# Patient Record
Sex: Male | Born: 1950
Health system: Southern US, Community
[De-identification: ages and names within clinical notes are randomized; demographics above are authoritative.]

## PROBLEM LIST (undated history)

## (undated) DIAGNOSIS — I639 Cerebral infarction, unspecified: Secondary | ICD-10-CM

## (undated) DIAGNOSIS — E7800 Pure hypercholesterolemia, unspecified: Secondary | ICD-10-CM

## (undated) DIAGNOSIS — J309 Allergic rhinitis, unspecified: Secondary | ICD-10-CM

## (undated) DIAGNOSIS — R718 Other abnormality of red blood cells: Secondary | ICD-10-CM

## (undated) DIAGNOSIS — M479 Spondylosis, unspecified: Secondary | ICD-10-CM

## (undated) DIAGNOSIS — N529 Male erectile dysfunction, unspecified: Secondary | ICD-10-CM

## (undated) DIAGNOSIS — E119 Type 2 diabetes mellitus without complications: Secondary | ICD-10-CM

## (undated) DIAGNOSIS — I10 Essential (primary) hypertension: Secondary | ICD-10-CM

## (undated) DIAGNOSIS — E041 Nontoxic single thyroid nodule: Secondary | ICD-10-CM

## (undated) HISTORY — DX: Other abnormality of red blood cells: R71.8

## (undated) HISTORY — DX: Pure hypercholesterolemia, unspecified: E78.00

## (undated) HISTORY — DX: Nontoxic single thyroid nodule: E04.1

## (undated) HISTORY — DX: Spondylosis, unspecified: M47.9

## (undated) HISTORY — DX: Allergic rhinitis, unspecified: J30.9

## (undated) HISTORY — DX: Type 2 diabetes mellitus without complications: E11.9

## (undated) HISTORY — DX: Male erectile dysfunction, unspecified: N52.9

## (undated) HISTORY — DX: Essential (primary) hypertension: I10

---

## 1998-02-12 ENCOUNTER — Ambulatory Visit (HOSPITAL_COMMUNITY): Admission: RE | Admit: 1998-02-12 | Discharge: 1998-02-12 | Payer: Self-pay | Admitting: Internal Medicine

## 2000-05-11 ENCOUNTER — Ambulatory Visit (HOSPITAL_COMMUNITY): Admission: RE | Admit: 2000-05-11 | Discharge: 2000-05-11 | Payer: Self-pay | Admitting: Endocrinology

## 2000-05-11 ENCOUNTER — Encounter: Payer: Self-pay | Admitting: Endocrinology

## 2003-07-27 ENCOUNTER — Emergency Department (HOSPITAL_COMMUNITY): Admission: AD | Admit: 2003-07-27 | Discharge: 2003-07-27 | Payer: Self-pay | Admitting: Family Medicine

## 2004-07-07 ENCOUNTER — Ambulatory Visit: Payer: Self-pay | Admitting: Endocrinology

## 2004-07-08 ENCOUNTER — Ambulatory Visit (HOSPITAL_COMMUNITY): Admission: RE | Admit: 2004-07-08 | Discharge: 2004-07-08 | Payer: Self-pay | Admitting: Endocrinology

## 2004-07-08 IMAGING — US US SOFT TISSUE HEAD/NECK
1 series · 14 of 25 positions shown · non-contrast
Comparison: none

CLINICAL DATA: Enlarged thyroid. Hypertension. Diabetes mellitus.
 ULTRASOUND SOFT TISSUE NECK:
 Multiple scans of the thyroid show the right lobe to measure 4.7 x 2.2 x 2.7 cm with a single 2 x 2 x 3 mm cyst in the region of the mid portion of the right lobe of the thyroid. The isthmus of the thyroid is normal and measures 4 mm in thickness.  The left lobe of the thyroid measures 4.8 x 1.9 x 2.2 cm and shows no cystic or solid mass.

[Series 1: unknown · 0.12mm/px · 14 of 46 slices shown]
[im 1/46]
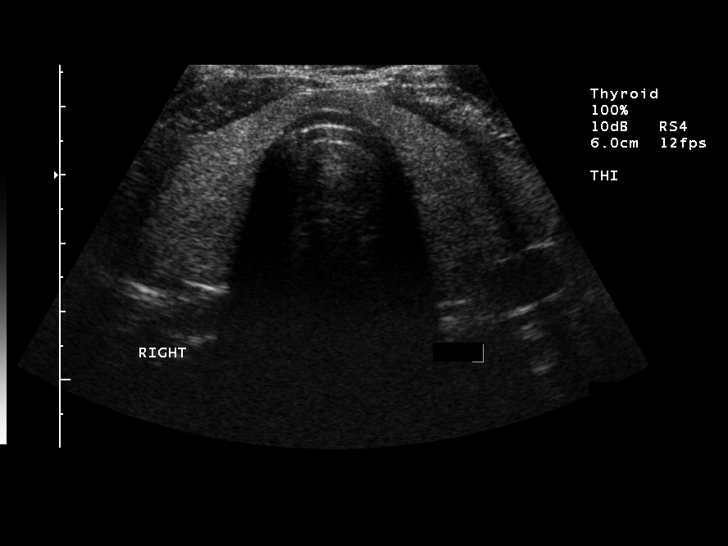
[im 4/46]
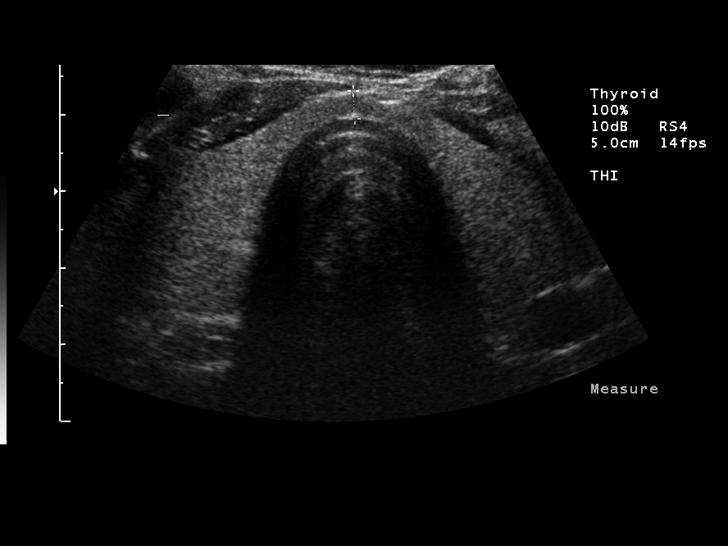
[im 8/46]
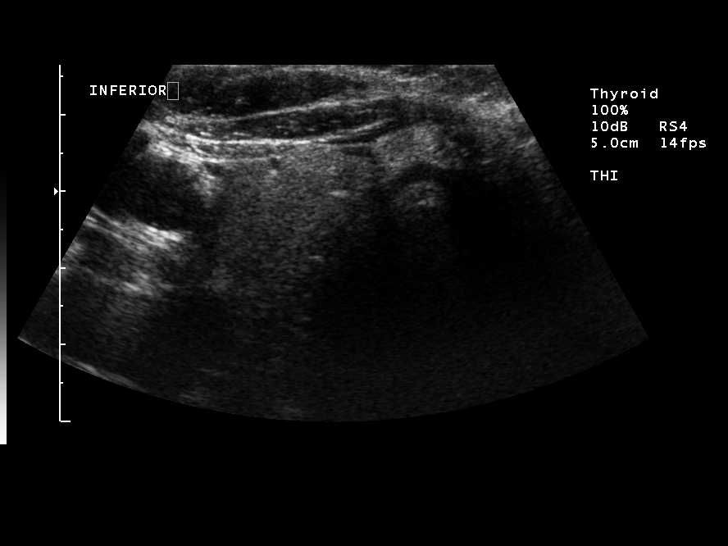
[im 12/46]
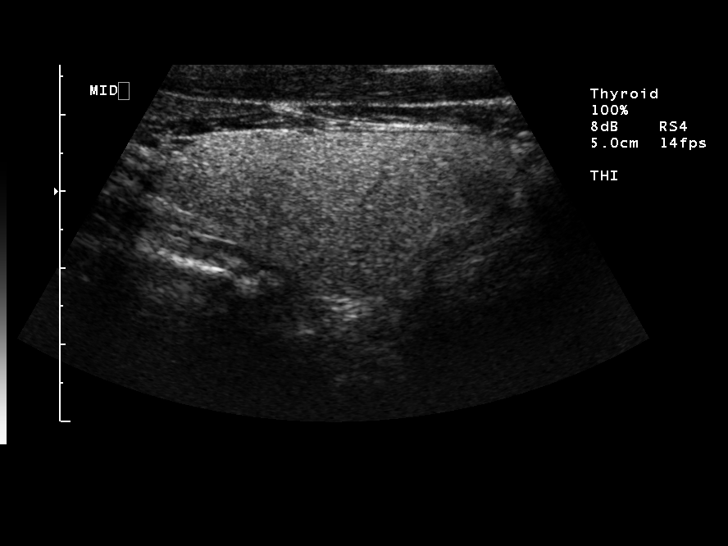
[im 16/46]
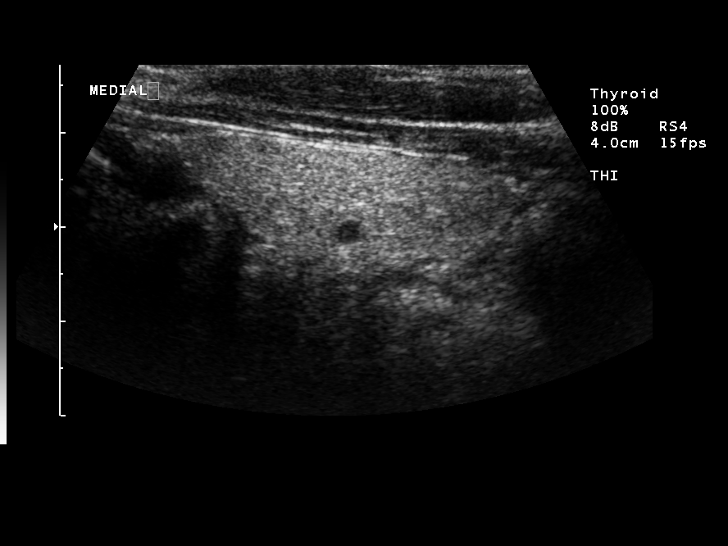
[im 17/46]
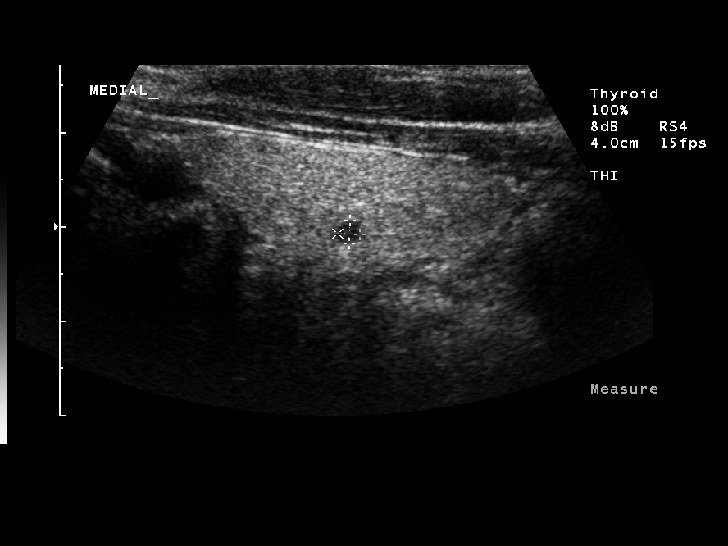
[im 21/46]
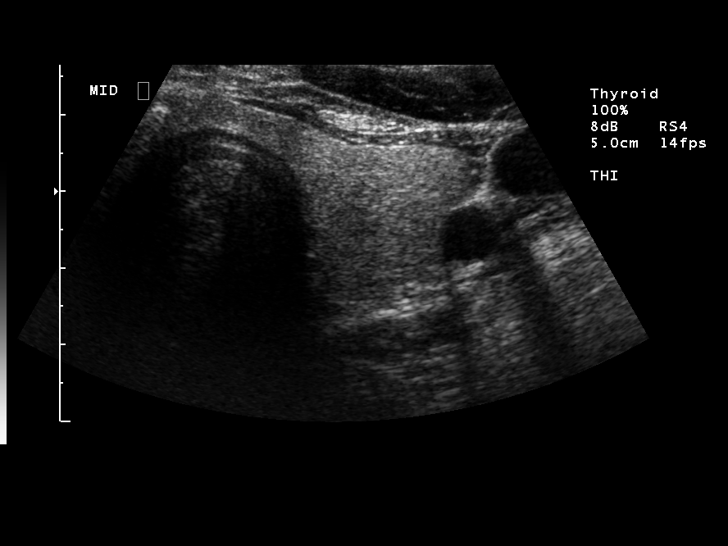
[im 25/46]
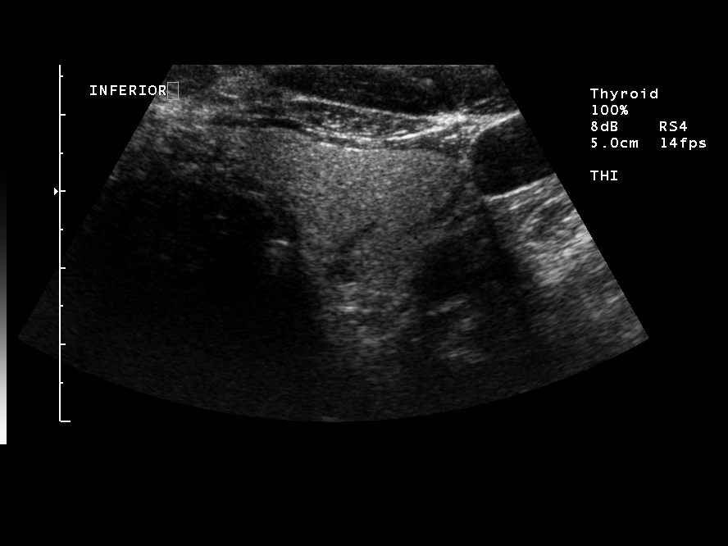
[im 29/46]
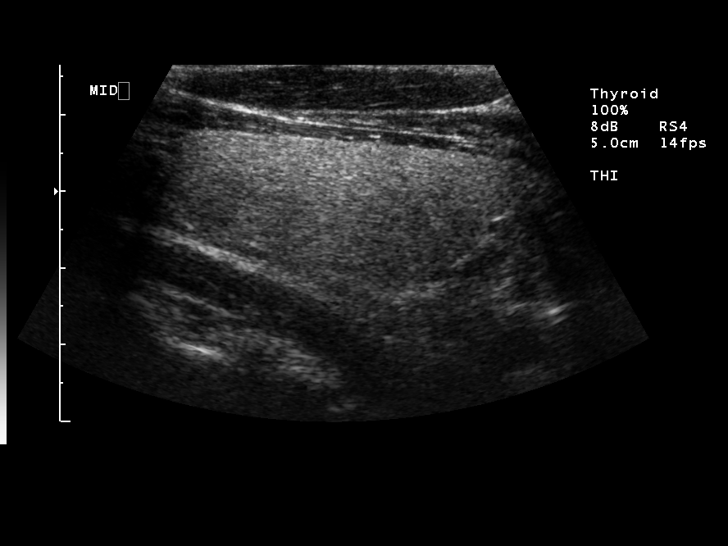
[im 31/46]
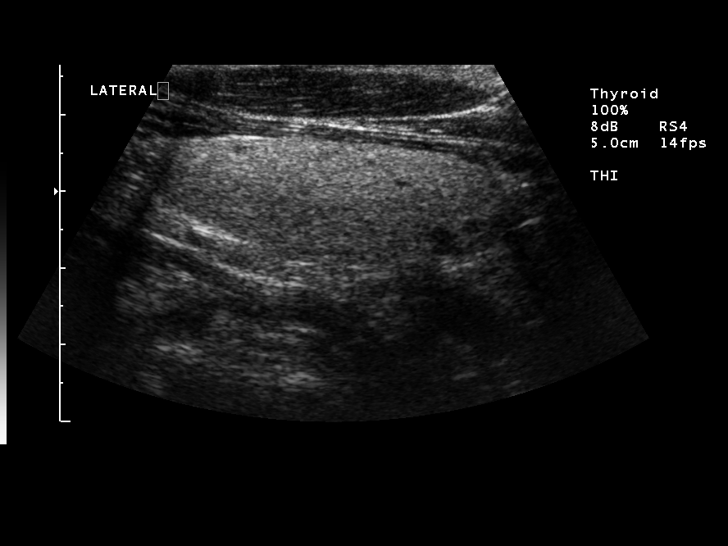
[im 34/46]
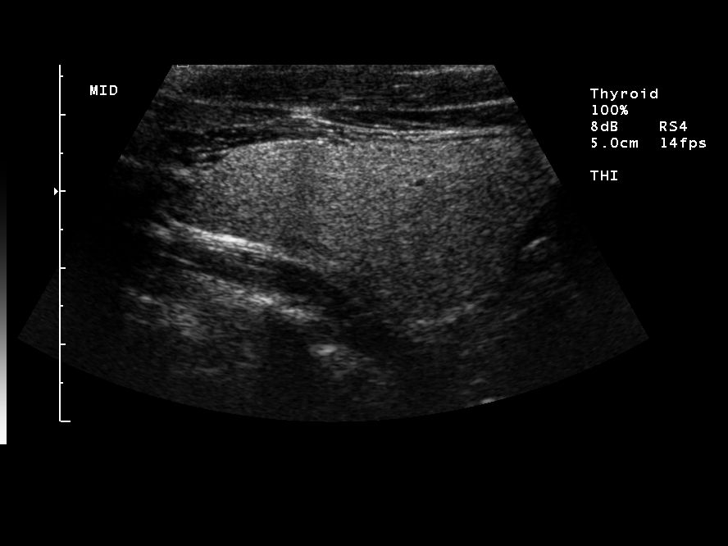
[im 38/46]
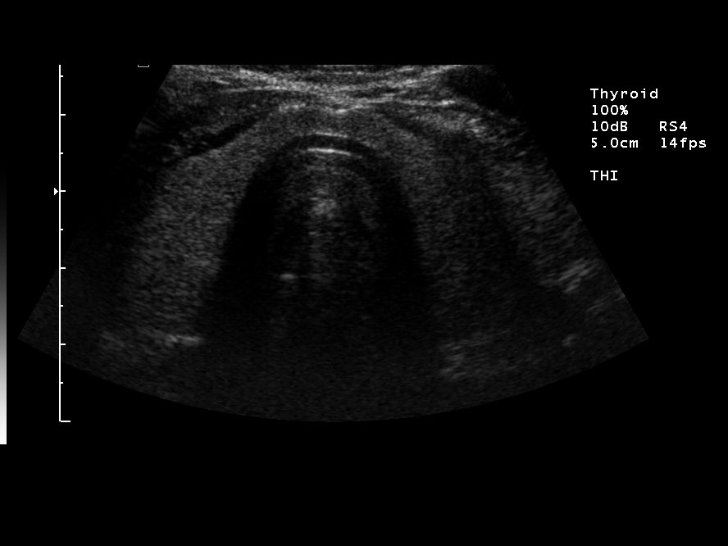
[im 42/46]
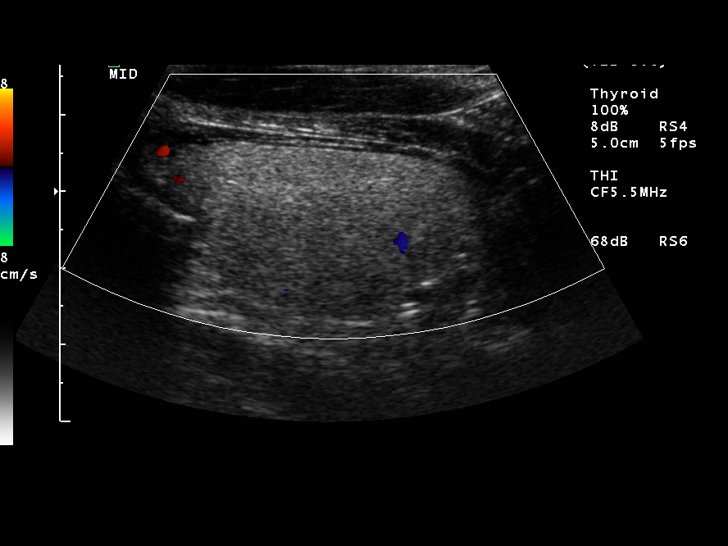
[im 46/46]
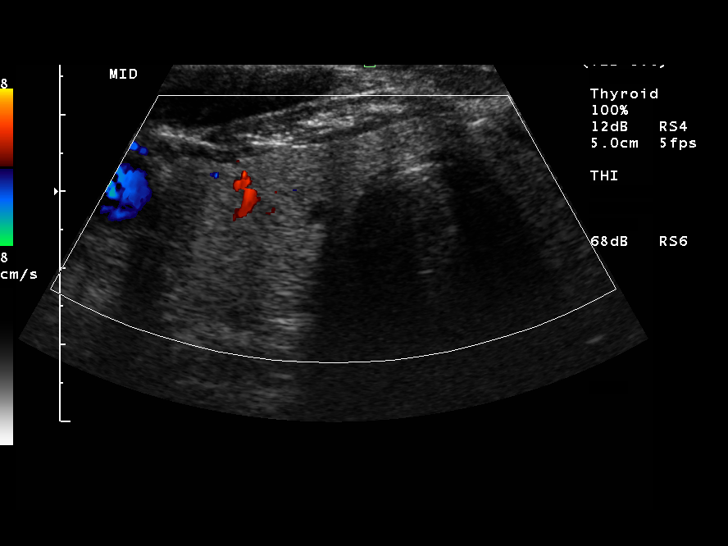

[14 of 25 positions shown; findings below may reference images not displayed]

IMPRESSION: Small 2 x 2 x 3 mm cyst mid portion right lobe of the thyroid. Overall size of the thyroid is not significantly enlarged.  There is no significant solid nodules.

## 2004-08-17 ENCOUNTER — Ambulatory Visit: Payer: Self-pay | Admitting: Endocrinology

## 2005-07-21 ENCOUNTER — Ambulatory Visit: Payer: Self-pay | Admitting: Internal Medicine

## 2005-07-22 ENCOUNTER — Emergency Department (HOSPITAL_COMMUNITY): Admission: EM | Admit: 2005-07-22 | Discharge: 2005-07-22 | Payer: Self-pay | Admitting: Emergency Medicine

## 2005-07-23 ENCOUNTER — Emergency Department (HOSPITAL_COMMUNITY): Admission: EM | Admit: 2005-07-23 | Discharge: 2005-07-23 | Payer: Self-pay | Admitting: Emergency Medicine

## 2005-07-26 ENCOUNTER — Ambulatory Visit: Payer: Self-pay | Admitting: Endocrinology

## 2005-08-26 ENCOUNTER — Ambulatory Visit: Payer: Self-pay | Admitting: Family Medicine

## 2005-09-05 ENCOUNTER — Ambulatory Visit: Payer: Self-pay | Admitting: Endocrinology

## 2005-10-19 ENCOUNTER — Ambulatory Visit: Payer: Self-pay | Admitting: Endocrinology

## 2006-02-01 ENCOUNTER — Ambulatory Visit: Payer: Self-pay | Admitting: Endocrinology

## 2007-04-01 ENCOUNTER — Encounter: Payer: Self-pay | Admitting: Endocrinology

## 2007-04-01 DIAGNOSIS — J309 Allergic rhinitis, unspecified: Secondary | ICD-10-CM | POA: Insufficient documentation

## 2007-04-01 DIAGNOSIS — I10 Essential (primary) hypertension: Secondary | ICD-10-CM

## 2007-04-01 DIAGNOSIS — E119 Type 2 diabetes mellitus without complications: Secondary | ICD-10-CM

## 2007-04-01 DIAGNOSIS — E1159 Type 2 diabetes mellitus with other circulatory complications: Secondary | ICD-10-CM | POA: Insufficient documentation

## 2007-04-01 HISTORY — DX: Allergic rhinitis, unspecified: J30.9

## 2007-04-01 HISTORY — DX: Type 2 diabetes mellitus without complications: E11.9

## 2007-04-01 HISTORY — DX: Essential (primary) hypertension: I10

## 2007-04-12 ENCOUNTER — Ambulatory Visit: Payer: Self-pay | Admitting: Endocrinology

## 2007-05-31 ENCOUNTER — Ambulatory Visit: Payer: Self-pay | Admitting: Endocrinology

## 2007-05-31 LAB — CONVERTED CEMR LAB
ALT: 22 units/L (ref 0–53)
AST: 21 units/L (ref 0–37)
Albumin: 3.9 g/dL (ref 3.5–5.2)
Alkaline Phosphatase: 79 units/L (ref 39–117)
BUN: 21 mg/dL (ref 6–23)
Basophils Absolute: 0.2 10*3/uL — ABNORMAL HIGH (ref 0.0–0.1)
Basophils Relative: 0.7 % (ref 0.0–1.0)
Bilirubin Urine: NEGATIVE
Bilirubin, Direct: 0.2 mg/dL (ref 0.0–0.3)
CO2: 32 meq/L (ref 19–32)
Calcium: 10 mg/dL (ref 8.4–10.5)
Chloride: 105 meq/L (ref 96–112)
Cholesterol: 170 mg/dL (ref 0–200)
Creatinine, Ser: 1.3 mg/dL (ref 0.4–1.5)
Creatinine,U: 108.6 mg/dL
Crystals: NEGATIVE
Eosinophils Absolute: 0.1 10*3/uL (ref 0.0–0.6)
Eosinophils Relative: 1.2 % (ref 0.0–5.0)
GFR calc Af Amer: 74 mL/min
GFR calc non Af Amer: 61 mL/min
Glucose, Bld: 81 mg/dL (ref 70–99)
HCT: 44.1 % (ref 39.0–52.0)
HDL: 47.9 mg/dL (ref >39.0)
Hemoglobin: 14.8 g/dL (ref 13.0–17.0)
Hgb A1c MFr Bld: 5.8 % (ref 4.6–6.0)
Ketones, ur: NEGATIVE mg/dL
LDL Cholesterol: 108 mg/dL — ABNORMAL HIGH (ref 0–99)
Leukocytes, UA: NEGATIVE
Lymphocytes Relative: 37 % (ref 12.0–46.0)
MCHC: 33.5 g/dL (ref 30.0–36.0)
MCV: 81.4 fL (ref 78.0–100.0)
Microalb Creat Ratio: 23.9 mg/g (ref 0.0–30.0)
Microalb, Ur: 2.6 mg/dL — ABNORMAL HIGH (ref 0.0–1.9)
Monocytes Absolute: 0.7 10*3/uL (ref 0.2–0.7)
Monocytes Relative: 6.6 % (ref 3.0–11.0)
Mucus, UA: NEGATIVE
Neutro Abs: 5.7 10*3/uL (ref 1.4–7.7)
Neutrophils Relative %: 54.5 % (ref 43.0–77.0)
Nitrite: NEGATIVE
PSA: 0.58 ng/mL (ref 0.10–4.00)
Platelets: 151 10*3/uL (ref 150–400)
Potassium: 3.7 meq/L (ref 3.5–5.1)
RBC: 5.42 M/uL (ref 4.22–5.81)
RDW: 14.1 % (ref 11.5–14.6)
Sodium: 144 meq/L (ref 135–145)
Specific Gravity, Urine: 1.02 (ref 1.000–1.03)
Squamous Epithelial / HPF: NEGATIVE /lpf
TSH: 1.15 microintl units/mL (ref 0.35–5.50)
Total Bilirubin: 0.8 mg/dL (ref 0.3–1.2)
Total CHOL/HDL Ratio: 3.5
Total Protein, Urine: NEGATIVE mg/dL
Total Protein: 7.3 g/dL (ref 6.0–8.3)
Triglycerides: 73 mg/dL (ref 0–149)
Urine Glucose: NEGATIVE mg/dL
Urobilinogen, UA: 0.2 (ref 0.0–1.0)
VLDL: 15 mg/dL (ref 0–40)
WBC: 10.4 10*3/uL (ref 4.5–10.5)
pH: 5.5 (ref 5.0–8.0)

## 2007-05-31 IMAGING — CR DG THORACIC SPINE 2V
4 series · 4 of 4 positions shown · non-contrast
Comparison: none

CLINICAL DATA: 56-year-old with upper back pain. History of a fall.
 THORACIC SPINE - 3 VIEW:

[view not recorded (1 of 4)]
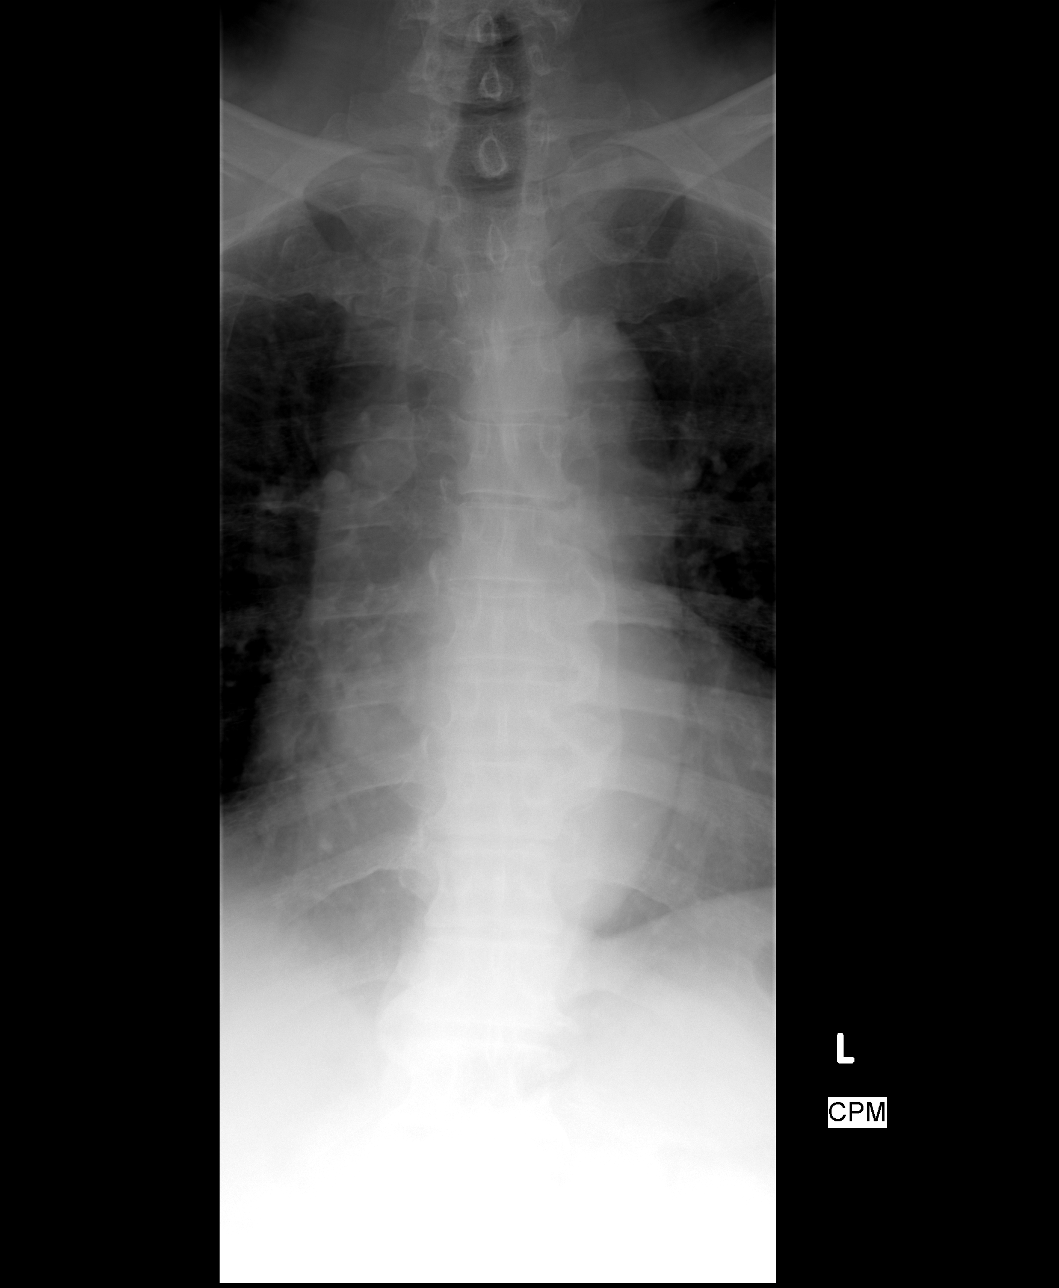

[view not recorded (2 of 4)]
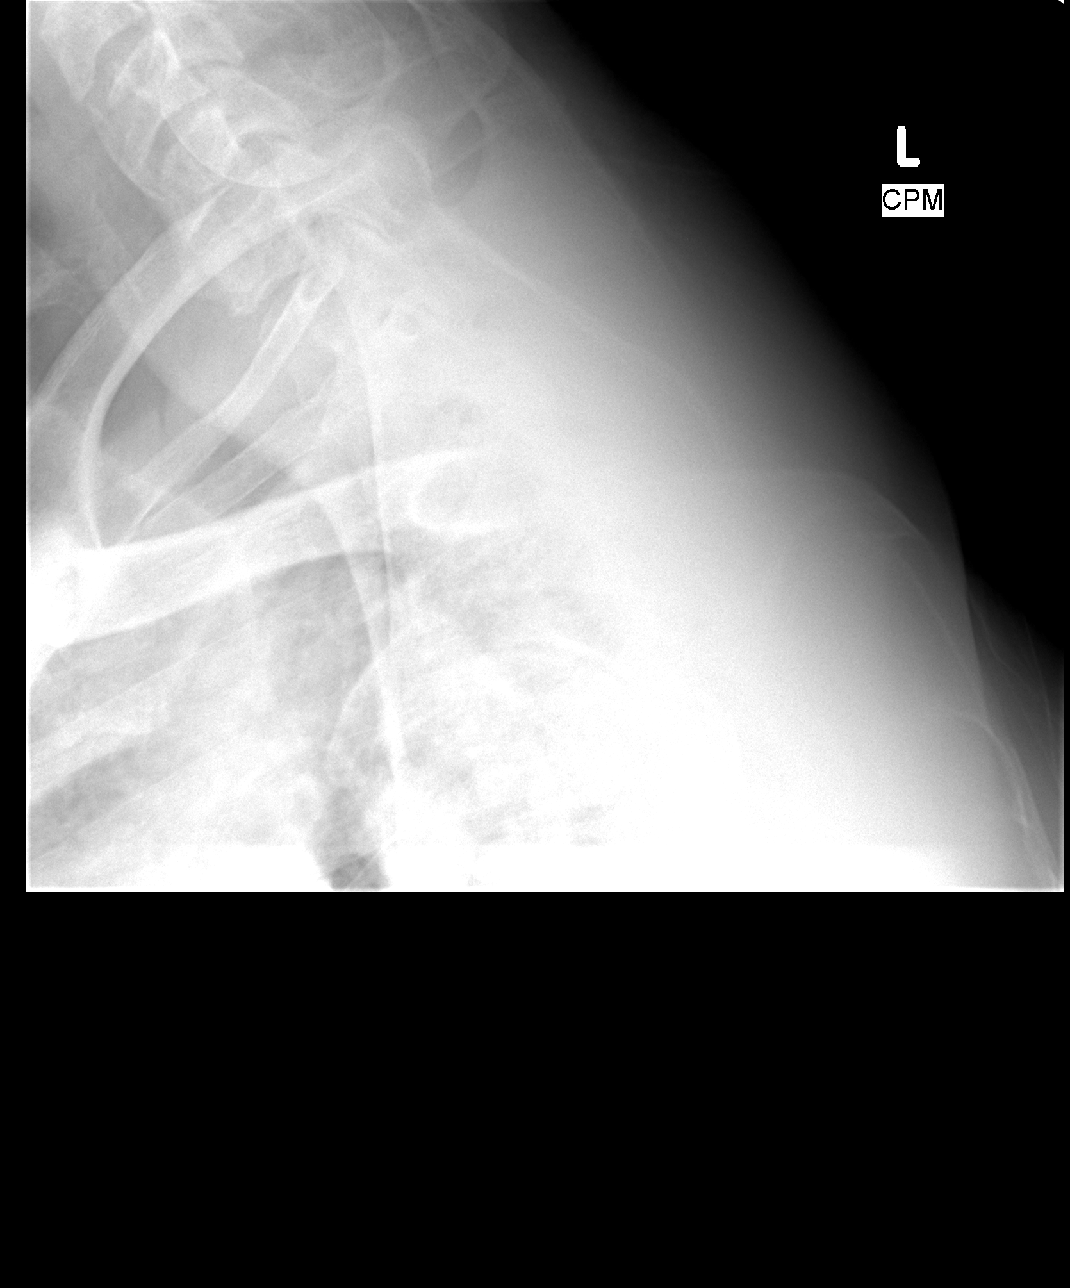

[view not recorded (3 of 4)]
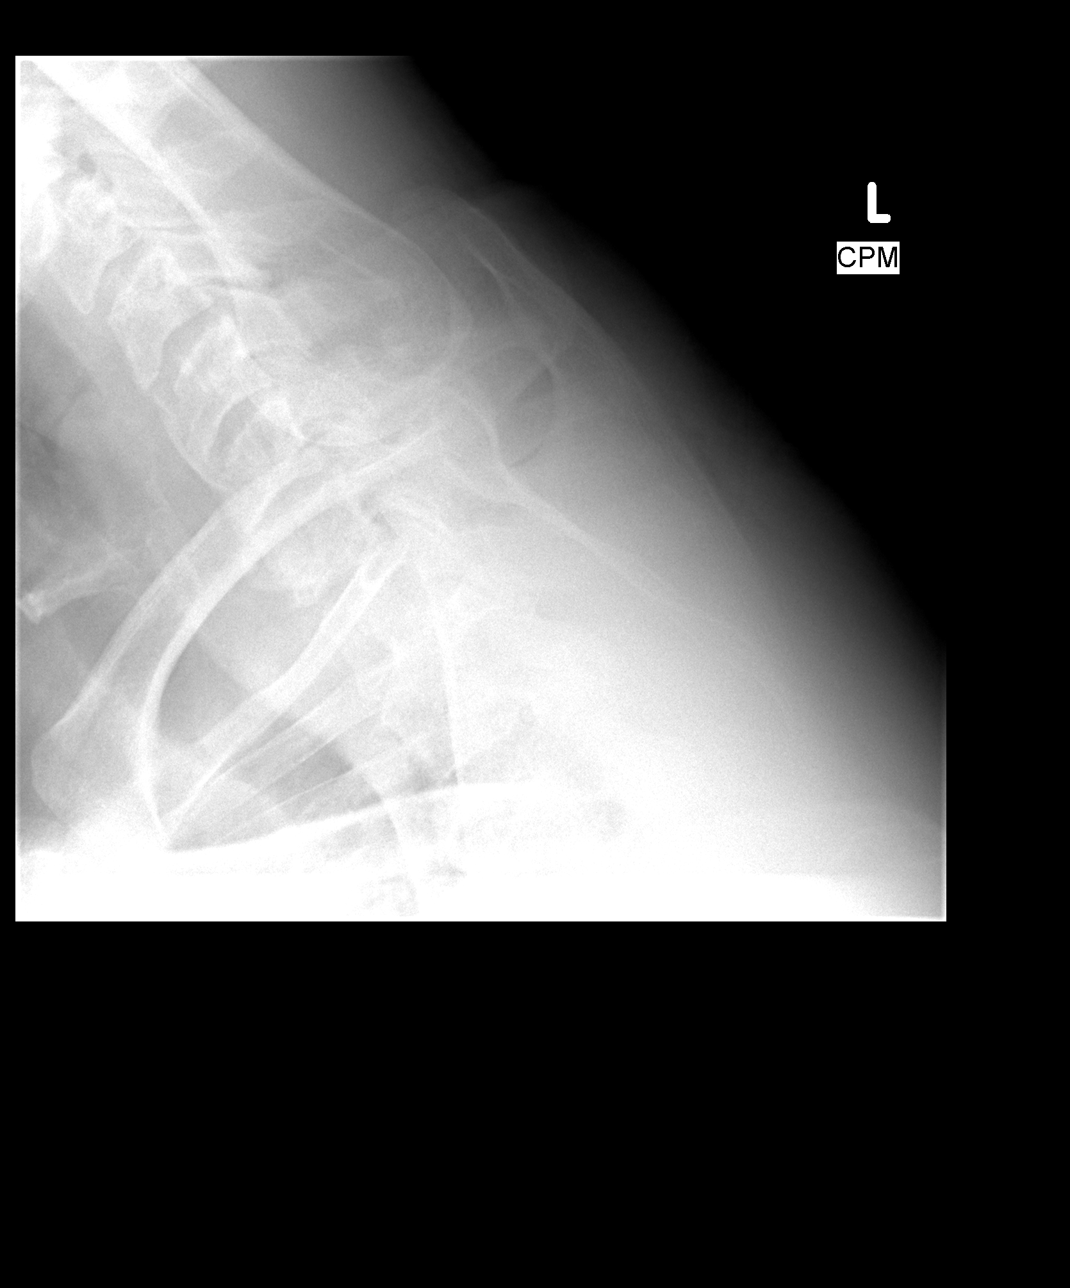

[view not recorded (4 of 4)]
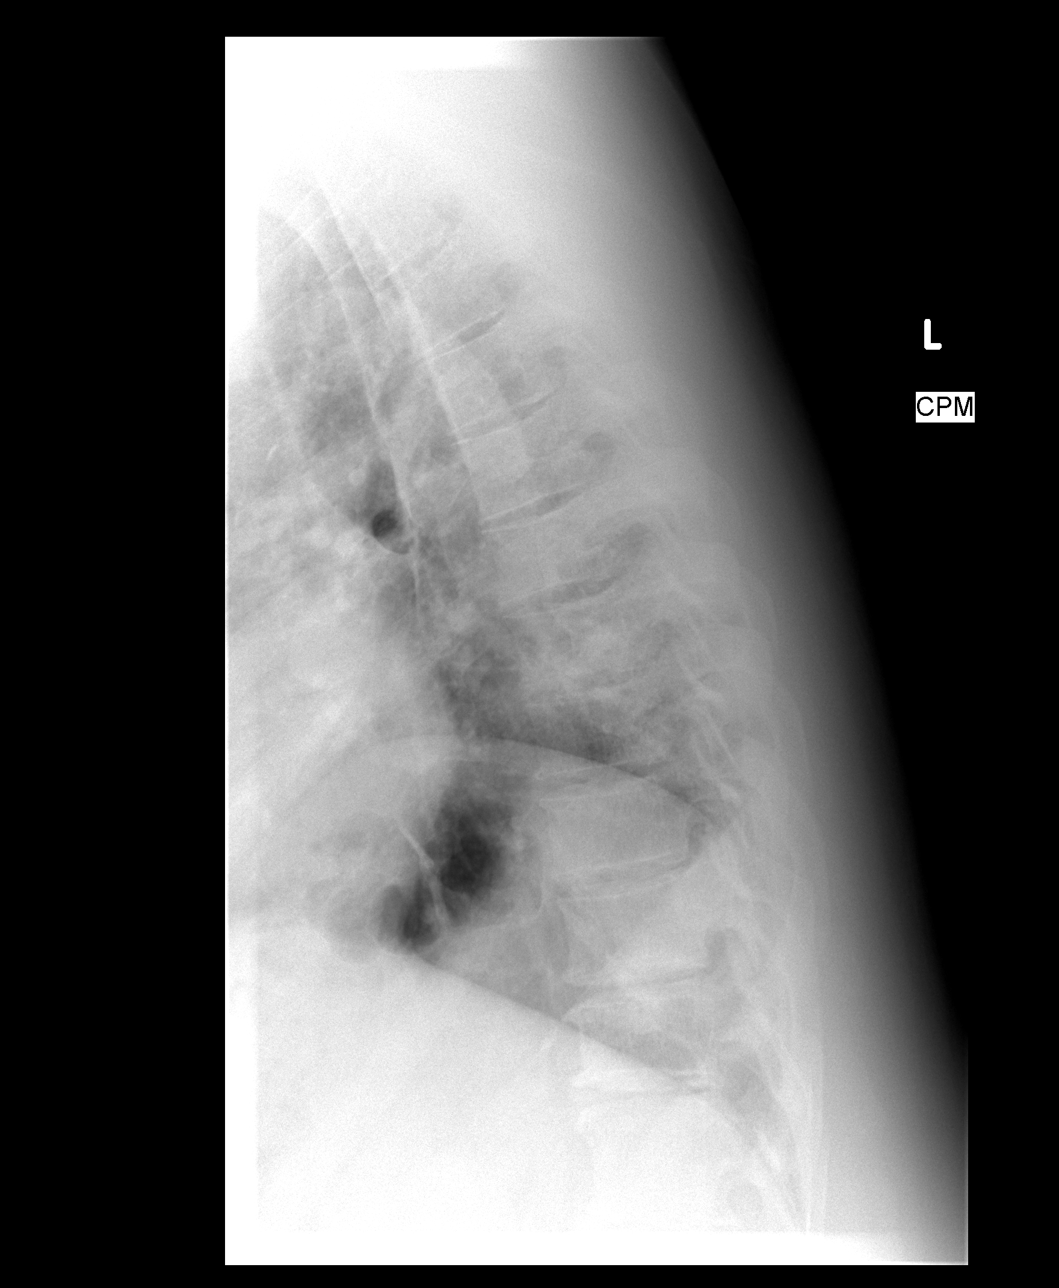

[4 of 4 positions shown; findings below may reference images not displayed]

FINDINGS: The lateral film demonstrates normal overall alignment of the thoracic vertebral bodies. No definite acute compression fractures are seen. Mild degenerative changes in the lower thoracic spine with osteophytic spurring. Mild scoliotic curvature noted on the frontal film with bridging osteophytes.
IMPRESSION: Degenerative changes but no acute bony findings.

## 2007-06-10 ENCOUNTER — Telehealth (INDEPENDENT_AMBULATORY_CARE_PROVIDER_SITE_OTHER): Payer: Self-pay | Admitting: *Deleted

## 2008-01-17 ENCOUNTER — Telehealth (INDEPENDENT_AMBULATORY_CARE_PROVIDER_SITE_OTHER): Payer: Self-pay | Admitting: *Deleted

## 2008-02-07 ENCOUNTER — Telehealth: Payer: Self-pay | Admitting: Endocrinology

## 2008-02-07 ENCOUNTER — Ambulatory Visit: Payer: Self-pay | Admitting: Endocrinology

## 2008-02-07 DIAGNOSIS — E041 Nontoxic single thyroid nodule: Secondary | ICD-10-CM

## 2008-02-07 DIAGNOSIS — N529 Male erectile dysfunction, unspecified: Secondary | ICD-10-CM

## 2008-02-07 DIAGNOSIS — R718 Other abnormality of red blood cells: Secondary | ICD-10-CM

## 2008-02-07 DIAGNOSIS — M479 Spondylosis, unspecified: Secondary | ICD-10-CM

## 2008-02-07 DIAGNOSIS — E7800 Pure hypercholesterolemia, unspecified: Secondary | ICD-10-CM | POA: Insufficient documentation

## 2008-02-07 HISTORY — DX: Nontoxic single thyroid nodule: E04.1

## 2008-02-07 HISTORY — DX: Male erectile dysfunction, unspecified: N52.9

## 2008-02-07 HISTORY — DX: Pure hypercholesterolemia, unspecified: E78.00

## 2008-02-07 HISTORY — DX: Other abnormality of red blood cells: R71.8

## 2008-02-07 HISTORY — DX: Spondylosis, unspecified: M47.9

## 2008-05-07 ENCOUNTER — Telehealth (INDEPENDENT_AMBULATORY_CARE_PROVIDER_SITE_OTHER): Payer: Self-pay | Admitting: *Deleted

## 2008-06-12 ENCOUNTER — Ambulatory Visit: Payer: Self-pay | Admitting: Endocrinology

## 2008-09-28 ENCOUNTER — Telehealth (INDEPENDENT_AMBULATORY_CARE_PROVIDER_SITE_OTHER): Payer: Self-pay | Admitting: *Deleted

## 2008-10-16 ENCOUNTER — Ambulatory Visit: Payer: Self-pay | Admitting: Endocrinology

## 2009-01-27 ENCOUNTER — Ambulatory Visit: Payer: Self-pay | Admitting: Endocrinology

## 2009-01-27 LAB — CONVERTED CEMR LAB
ALT: 19 units/L (ref 0–53)
AST: 22 units/L (ref 0–37)
Albumin: 3.9 g/dL (ref 3.5–5.2)
Alkaline Phosphatase: 80 units/L (ref 39–117)
BUN: 24 mg/dL — ABNORMAL HIGH (ref 6–23)
Basophils Absolute: 0 10*3/uL (ref 0.0–0.1)
Basophils Relative: 0.3 % (ref 0.0–3.0)
Bilirubin Urine: NEGATIVE
Bilirubin, Direct: 0.1 mg/dL (ref 0.0–0.3)
CO2: 30 meq/L (ref 19–32)
Calcium: 9.3 mg/dL (ref 8.4–10.5)
Chloride: 107 meq/L (ref 96–112)
Cholesterol: 94 mg/dL (ref 0–200)
Creatinine, Ser: 1.4 mg/dL (ref 0.4–1.5)
Creatinine,U: 265 mg/dL
Eosinophils Absolute: 0.1 10*3/uL (ref 0.0–0.7)
Eosinophils Relative: 0.9 % (ref 0.0–5.0)
GFR calc non Af Amer: 67.11 mL/min (ref >60)
Glucose, Bld: 71 mg/dL (ref 70–99)
HCT: 43 % (ref 39.0–52.0)
HDL: 44.6 mg/dL (ref >39.00)
Hemoglobin: 14.6 g/dL (ref 13.0–17.0)
Hgb A1c MFr Bld: 5.9 % (ref 4.6–6.5)
LDL Cholesterol: 43 mg/dL (ref 0–99)
Leukocytes, UA: NEGATIVE
Lymphocytes Relative: 32.4 % (ref 12.0–46.0)
Lymphs Abs: 2.4 10*3/uL (ref 0.7–4.0)
MCHC: 33.8 g/dL (ref 30.0–36.0)
MCV: 82.7 fL (ref 78.0–100.0)
Microalb Creat Ratio: 35.1 mg/g — ABNORMAL HIGH (ref 0.0–30.0)
Microalb, Ur: 9.3 mg/dL — ABNORMAL HIGH (ref 0.0–1.9)
Monocytes Absolute: 0.7 10*3/uL (ref 0.1–1.0)
Monocytes Relative: 9.6 % (ref 3.0–12.0)
Neutro Abs: 4.3 10*3/uL (ref 1.4–7.7)
Neutrophils Relative %: 56.8 % (ref 43.0–77.0)
Nitrite: NEGATIVE
PSA: 0.64 ng/mL (ref 0.10–4.00)
Platelets: 178 10*3/uL (ref 150.0–400.0)
Potassium: 3.6 meq/L (ref 3.5–5.1)
RBC: 5.2 M/uL (ref 4.22–5.81)
RDW: 14.4 % (ref 11.5–14.6)
Sodium: 143 meq/L (ref 135–145)
Specific Gravity, Urine: 1.025 (ref 1.000–1.030)
TSH: 1.29 microintl units/mL (ref 0.35–5.50)
Total Bilirubin: 0.7 mg/dL (ref 0.3–1.2)
Total CHOL/HDL Ratio: 2
Total Protein: 6.8 g/dL (ref 6.0–8.3)
Triglycerides: 31 mg/dL (ref 0.0–149.0)
Urine Glucose: NEGATIVE mg/dL
Urobilinogen, UA: 0.2 (ref 0.0–1.0)
VLDL: 6.2 mg/dL (ref 0.0–40.0)
WBC: 7.5 10*3/uL (ref 4.5–10.5)
pH: 5.5 (ref 5.0–8.0)

## 2009-01-29 ENCOUNTER — Ambulatory Visit: Payer: Self-pay | Admitting: Endocrinology

## 2009-01-29 DIAGNOSIS — R809 Proteinuria, unspecified: Secondary | ICD-10-CM

## 2009-10-19 ENCOUNTER — Encounter: Payer: Self-pay | Admitting: Endocrinology

## 2009-11-19 ENCOUNTER — Telehealth: Payer: Self-pay | Admitting: Endocrinology

## 2010-01-07 ENCOUNTER — Telehealth: Payer: Self-pay | Admitting: Endocrinology

## 2010-02-04 ENCOUNTER — Ambulatory Visit: Payer: Self-pay | Admitting: Endocrinology

## 2010-02-04 DIAGNOSIS — R609 Edema, unspecified: Secondary | ICD-10-CM | POA: Insufficient documentation

## 2010-02-04 DIAGNOSIS — Z720 Tobacco use: Secondary | ICD-10-CM | POA: Insufficient documentation

## 2010-02-04 DIAGNOSIS — F17200 Nicotine dependence, unspecified, uncomplicated: Secondary | ICD-10-CM

## 2010-02-04 LAB — CONVERTED CEMR LAB
ALT: 17 units/L (ref 0–53)
AST: 22 units/L (ref 0–37)
Albumin: 4.1 g/dL (ref 3.5–5.2)
Alkaline Phosphatase: 76 units/L (ref 39–117)
BUN: 20 mg/dL (ref 6–23)
Basophils Absolute: 0 10*3/uL (ref 0.0–0.1)
Basophils Relative: 0.4 % (ref 0.0–3.0)
Bilirubin, Direct: 0.1 mg/dL (ref 0.0–0.3)
CO2: 30 meq/L (ref 19–32)
Calcium: 9.3 mg/dL (ref 8.4–10.5)
Chloride: 107 meq/L (ref 96–112)
Cholesterol: 102 mg/dL (ref 0–200)
Creatinine, Ser: 1.2 mg/dL (ref 0.4–1.5)
Creatinine,U: 140.3 mg/dL
Eosinophils Absolute: 0.1 10*3/uL (ref 0.0–0.7)
Eosinophils Relative: 0.9 % (ref 0.0–5.0)
GFR calc non Af Amer: 80.66 mL/min (ref >60)
Glucose, Bld: 86 mg/dL (ref 70–99)
HCT: 42.6 % (ref 39.0–52.0)
HDL: 45.6 mg/dL (ref >39.00)
Hemoglobin: 14.5 g/dL (ref 13.0–17.0)
Hgb A1c MFr Bld: 5.9 % (ref 4.6–6.5)
LDL Cholesterol: 48 mg/dL (ref 0–99)
Lymphocytes Relative: 26.6 % (ref 12.0–46.0)
Lymphs Abs: 2.1 10*3/uL (ref 0.7–4.0)
MCHC: 33.9 g/dL (ref 30.0–36.0)
MCV: 82.2 fL (ref 78.0–100.0)
Microalb Creat Ratio: 6.4 mg/g (ref 0.0–30.0)
Microalb, Ur: 9 mg/dL — ABNORMAL HIGH (ref 0.0–1.9)
Monocytes Absolute: 0.8 10*3/uL (ref 0.1–1.0)
Monocytes Relative: 10.1 % (ref 3.0–12.0)
Neutro Abs: 4.9 10*3/uL (ref 1.4–7.7)
Neutrophils Relative %: 62 % (ref 43.0–77.0)
PSA: 2.87 ng/mL (ref 0.10–4.00)
Platelets: 190 10*3/uL (ref 150.0–400.0)
Potassium: 3.4 meq/L — ABNORMAL LOW (ref 3.5–5.1)
RBC: 5.18 M/uL (ref 4.22–5.81)
RDW: 15.7 % — ABNORMAL HIGH (ref 11.5–14.6)
Sodium: 144 meq/L (ref 135–145)
TSH: 1.31 microintl units/mL (ref 0.35–5.50)
Total Bilirubin: 0.5 mg/dL (ref 0.3–1.2)
Total CHOL/HDL Ratio: 2
Total Protein: 7 g/dL (ref 6.0–8.3)
Triglycerides: 44 mg/dL (ref 0.0–149.0)
VLDL: 8.8 mg/dL (ref 0.0–40.0)
WBC: 7.9 10*3/uL (ref 4.5–10.5)

## 2010-02-07 ENCOUNTER — Encounter (INDEPENDENT_AMBULATORY_CARE_PROVIDER_SITE_OTHER): Payer: Self-pay | Admitting: *Deleted

## 2010-02-24 ENCOUNTER — Telehealth: Payer: Self-pay | Admitting: Endocrinology

## 2010-09-20 NOTE — Letter (Signed)
Summary: [facility] Consult Scheduled Letter  [facility]  [address] [place], [place] [address]   Phone: [phone]  Fax: [phone]      02/07/2010 MRN: [id number]  Roberto Rivera [address] [place], [place]  [address]    Dear [name],      We have scheduled an appointment for you.  At the recommendation of Dr.Ellison, we have scheduled you a consult with Dr Leonie Man on 04/14/10 at 9:40am.  Their phone number is [phone].  If this appointment day and time is not convenient for you, please feel free to call the office of the doctor you are being referred to at the number listed above and reschedule the appointment.     [facility] [address] [place], [place] [address]    Thank you,  Patient Care Coordinator [facility]

## 2010-09-20 NOTE — Progress Notes (Signed)
  Phone Note Refill Request Message from:  Fax from Pharmacy on November 19, 2009 2:20 PM  Refills Requested: Medication #1:  ONETOUCH ULTRA TEST  STRP once daily   Dosage confirmed as above?Dosage Confirmed Initial call taken by: Josph Macho RMA,  November 19, 2009 2:20 PM    Prescriptions: [place] TEST  STRP (GLUCOSE BLOOD) once daily, and lancets, 250.00  #100 x 1   Entered by:   Josph Macho RMA   Authorized by:   Minus Breeding MD   Signed by:   Josph Macho RMA on 11/19/2009   Method used:   Electronically to        [facility] [address] (retail)       [address] [place], [place]  [address]       Ph: [phone]       Fax: [phone]   RxID:   [id number]

## 2010-09-20 NOTE — Letter (Signed)
Summary: Referral - not able to see patient  [facility]  [address] [place], [place] [address]   Phone: [phone]  Fax: [phone]    October 19, 2009   Dr. Romero Belling [address] [place], [place] [address]   Re:   WACEY ZIEGER DOB:  [birth year] MRN:   [id number]    Dear Dr. Everardo All:  Thank you for your kind referral of the above patient.  We have attempted to schedule the recommended procedure Screening Colonoscopy but have not been able to schedule because:   X  The patient was not available by phone and/or has not returned our calls.  ___ The patient declined to schedule the procedure at this time.  We appreciate the referral and hope that we will have the opportunity to treat this patient in the future.    Sincerely,    [facility] [phone]

## 2010-09-20 NOTE — Progress Notes (Signed)
  Phone Note Refill Request Message from:  Fax from Pharmacy on Jan 07, 2010 11:34 AM  Refills Requested: Medication #1:  BENICAR HCT 20-12.5 MG TABS 1 tab qd.   Dosage confirmed as above?Dosage Confirmed Initial call taken by: Josph Macho RMA,  Jan 07, 2010 11:34 AM    Prescriptions: BENICAR HCT 20-12.5 MG TABS (OLMESARTAN MEDOXOMIL-HCTZ) 1 tab qd  #30 x 5   Entered by:   Josph Macho RMA   Authorized by:   Minus Breeding MD   Signed by:   Josph Macho RMA on 01/07/2010   Method used:   Electronically to        [facility] [address] (retail)       [address] [place], [place]  [address]       Ph: [phone]       Fax: [phone]   RxID:   [id number]

## 2010-09-20 NOTE — Progress Notes (Signed)
Summary: PA--Benicar  Phone Note From Pharmacy   Summary of Call: PA request--Benicar. The preferred products are usually Losartan, Diovan, and Micardis. Please advise. Initial call taken by: Lucious Groves,  Jan 07, 2010 2:16 PM  Follow-up for Phone Call        i changed ot losartan hctz, and i sent rx.  please advise pt. Follow-up by: Minus Breeding MD,  Jan 07, 2010 2:22 PM  Additional Follow-up for Phone Call Additional follow up Details #1::        Pt called stating he is almost out of his benicar. Waiting on md to do prior auth. Notified pt that md change benicar to losartan and rx has already been sent to his pharmacy Additional Follow-up by: Orlan Leavens,  Jan 07, 2010 4:38 PM    New/Updated Medications: LOSARTAN POTASSIUM-HCTZ 50-12.5 MG TABS (LOSARTAN POTASSIUM-HCTZ) 1 once daily Prescriptions: LOSARTAN POTASSIUM-HCTZ 50-12.5 MG TABS (LOSARTAN POTASSIUM-HCTZ) 1 once daily  #30 x 1   Entered and Authorized by:   Minus Breeding MD   Signed by:   Minus Breeding MD on 01/07/2010   Method used:   Electronically to        [facility] [address] (retail)       [address] [place], [place]  [address]       Ph: [phone]       Fax: [phone]   RxID:   [id number]

## 2010-09-20 NOTE — Progress Notes (Signed)
Summary: Rx refill req  Phone Note Refill Request Message from:  Fax from Pharmacy on February 24, 2010 4:10 PM  Refills Requested: Medication #1:  ACTOS 15 MG TABS 1 once daily.   Dosage confirmed as above?Dosage Confirmed  Method Requested: Electronic Initial call taken by: Margaret Pyle, CMA,  February 24, 2010 4:10 PM    Prescriptions: ACTOS 15 MG TABS (PIOGLITAZONE HCL) 1 once daily  #30 Tablet x 11   Entered by:   Margaret Pyle, CMA   Authorized by:   Minus Breeding MD   Signed by:   Margaret Pyle, CMA on 02/24/2010   Method used:   Electronically to        [facility] [address] (retail)       [address]       [place], [place]  [address]       Ph: [phone]       Fax: [phone]   RxID:   [id number]

## 2010-09-20 NOTE — Assessment & Plan Note (Signed)
Summary: PER LUCY--PHYSICAL--STC   Vital Signs:  Patient profile:   60 year old male Height:      70 inches Weight:      234 pounds BMI:     33.70 O2 Sat:      98 % on Room air Temp:     98.3 degrees F oral Pulse rate:   75 / minute BP sitting:   124 / 78  (left arm) Cuff size:   large  Vitals Entered By: Margaret Pyle, CMA (February 04, 2010 2:40 PM)  O2 Flow:  Room air CC: CPX - Pt no longer takes Cialis   Primary Provider:  Jonny Ruiz  CC:  CPX - Pt no longer takes Cialis.  History of Present Illness: here for regular wellness examination.  He's feeling pretty well in general, and does not drink etoh.   Allergies (verified): 1)  ! Vasotec  Family History: Reviewed history from 01/29/2009 and no changes required. mother had uncertain type of cancer  Social History: Reviewed history from 01/29/2009 and no changes required. divorced x many years works Designer, fashion/clothing  Review of Systems       The patient complains of weight gain.  The patient denies fever, vision loss, decreased hearing, syncope, prolonged cough, headaches, abdominal pain, melena, hematochezia, severe indigestion/heartburn, hematuria, suspicious skin lesions, and depression.    Physical Exam  General:  normal appearance.   Head:  head: no deformity eyes: no periorbital swelling, no proptosis external nose and ears are normal mouth: no lesion seen Mouth:  very poor dentition Neck:  Supple without thyroid enlargement or tenderness.  Lungs:  Clear to auscultation bilaterally. Normal respiratory effort.  Heart:  Regular rate and rhythm without murmurs or gallops noted. Normal S1,S2.   Abdomen:  abdomen is soft, nontender.  no hepatosplenomegaly.   not distended.  no hernia  Rectal:  normal external and internal exam.  heme neg  Prostate:  Normal size prostate without masses or tenderness.  Msk:  muscle bulk and strength are grossly normal.  no obvious joint swelling.  gait is normal and  steady  Pulses:  dorsalis pedis intact bilat.  no carotid bruit  Neurologic:  cn 2-12 grossly intact.   readily moves all 4's.   sensation is intact to touch on the feet  Cervical Nodes:  No significant adenopathy.  Psych:  Alert and cooperative; normal mood and affect; normal attention span and concentration.   Additional Exam:  SEPARATE EVALUATION FOLLOWS--EACH PROBLEM HERE IS NEW, NOT RESPONDING TO TREATMENT, OR POSES SIGNIFICANT RISK TO THE PATIENT'S HEALTH: HISTORY OF THE PRESENT ILLNESS: pt says edema is worse, but he wishes to continue actos mild hypokalemia is noted on labs pt states a year of slight nodule at the fingerpad of the left middle finger, and slight assoc pain PAST MEDICAL HISTORY reviewed and up to date today REVIEW OF SYSTEMS: denies sob and chest pain PHYSICAL EXAMINATION: see vs page extremities:  no deformity.  no ulcer on the feet.  feet are of normal color and temp.  2+ bilat leg edema skin:  there is an ulcerated wart on the fingerpad of the left middle finger. LAB/XRAY RESULTS: k+=3.4 a1c=5.9 IMPRESSION: mild hypokalemia edema, prob due to norvasc and actos wart PLAN: see instruction sheet   Impression & Recommendations:  Problem # 1:  HEALTH SCREENING (ICD-V70.0)  Medications Added to Medication List This Visit: 1)  Losartan Potassium-hctz 100-25 Mg Tabs (Losartan potassium-hctz) .Marland Kitchen.. 1 once daily 2)  Amlodipine Besylate 5 Mg Tabs (  Amlodipine besylate) .Marland Kitchen.. 1 once daily 3)  Actos 30 Mg Tabs (Pioglitazone hcl) .Marland Kitchen.. 1 once daily 4)  Simvastatin 40 Mg Tabs (Simvastatin) .Marland Kitchen.. 1 once daily 5)  Actos 15 Mg Tabs (Pioglitazone hcl) .Marland Kitchen.. 1 once daily  Other Orders: EKG w/ Interpretation (93000) Dermatology Referral (Derma) Pneumococcal Vaccine (43329) Admin 1st Vaccine (51884) TLB-Lipid Panel (80061-LIPID) TLB-BMP (Basic Metabolic Panel-BMET) (80048-METABOL) TLB-CBC Platelet - w/Differential (85025-CBCD) TLB-Hepatic/Liver Function Pnl  (80076-HEPATIC) TLB-TSH (Thyroid Stimulating Hormone) (84443-TSH) TLB-A1C / Hgb A1C (Glycohemoglobin) (83036-A1C) TLB-Microalbumin/Creat Ratio, Urine (82043-MALB) TLB-PSA (Prostate Specific Antigen) (84153-PSA) Est. Patient Level IV ([address]606) Est. Patient 40-64 years ([address])   Patient Instructions: 1)  please consider these measures for your health:  minimize alcohol.  do not use tobacco products.  have a colonoscopy at least every 10 years from age 52.  keep firearms safely stored.  always use seat belts.  have working smoke alarms in your home.  see the dentist regularly.  never drive under the influence of alcohol or drugs (including prescription drugs).  those with fair skin should take precautions against the sun. 2)  please let me know what your wishes would be, if artificial life support measures should become necessary.  it is critically important to prevent falling down (keep floor areas well-lit, dry, and free of loose objects). 3)  blood tests are being ordered for you today.  please call [phone] to hear your test results. 4)  increase losartan-hctz to 100/25, 1 per day. 5)  reduce amlodipine to 5 mg once daily 6)  refer to dermatology.  you will be called with a day and time for an appointment. 7)  Please schedule a follow-up appointment in 3 months. 8)  (update: i left message on phone-tree:  reduce zocor to 40 mg at bedtime.  reduce actos to 15 mg qd.  we'll follow k+ level) Prescriptions: ACTOS 30 MG TABS (PIOGLITAZONE HCL) 1 once daily  #90 x 3   Entered and Authorized by:   Minus Breeding MD   Signed by:   Minus Breeding MD on 02/04/2010   Method used:   Electronically to        [facility] [address] (retail)       [address]       [place], [place]  [address]       Ph: [phone]       Fax: [phone]   RxID:   [id number] AMLODIPINE BESYLATE 5 MG TABS (AMLODIPINE BESYLATE) 1 once daily  #90 x 3   Entered and Authorized by:   Minus Breeding MD   Signed by:   Minus Breeding MD on 02/04/2010   Method used:   Electronically to        [facility] [address] (retail)       [address] [place], [place]  [address]       Ph: [phone]       Fax: [phone]   RxID:   [id number] LOSARTAN POTASSIUM-HCTZ 100-25 MG TABS (LOSARTAN POTASSIUM-HCTZ) 1 once daily  #90 x 3   Entered and Authorized by:   Minus Breeding MD   Signed by:   Minus Breeding MD on 02/04/2010   Method used:   Electronically to        [facility] [address] (retail)  [address]       [place], [place]  [address]       Ph: [phone]       Fax: [phone]   RxID:   [id number] ZOCOR 80 MG  TABS (SIMVASTATIN) take 1 by mouth at bedtime  #90 x 3   Entered and Authorized by:   Minus Breeding MD   Signed by:   Minus Breeding MD on 02/04/2010   Method used:   Electronically to        [facility] [address] (retail)       [address]       [place], [place]  [address]       Ph: [phone]       Fax: [phone]   RxID:   [id number] TOPROL XL 50 MG TB24 (METOPROLOL SUCCINATE) take 1/2 by mouth once daily  #45 x 3   Entered and Authorized by:   Minus Breeding MD   Signed by:   Minus Breeding MD on 02/04/2010   Method used:   Electronically to        [facility] [address] (retail)       [address]       [place], [place]  [address]       Ph: [phone]       Fax: [phone]   RxID:   [id number] ONETOUCH ULTRA TEST  STRP (GLUCOSE BLOOD) once daily, and lancets, 250.00  #100 x 1   Entered and Authorized by:   Minus Breeding MD   Signed by:   Minus Breeding MD on 02/04/2010   Method used:   Electronically to        [facility] [address] (retail)       [address]       [place], [place]  [address]       Ph: [phone]       Fax: [phone]   RxID:   [id number] AMLODIPINE  BESYLATE 5 MG TABS (AMLODIPINE BESYLATE) 1 once daily  #30 x 11   Entered and Authorized by:   Minus Breeding MD   Signed by:   Minus Breeding MD on 02/04/2010   Method used:   Electronically to        [facility] [address] (retail)       [address] [place], [place]  [address]       Ph: [phone]       Fax: [phone]   RxID:   [id number] LOSARTAN POTASSIUM-HCTZ 100-25 MG TABS (LOSARTAN POTASSIUM-HCTZ) 1 once daily  #30 x 11   Entered and Authorized by:   Minus Breeding MD   Signed by:   Minus Breeding MD on 02/04/2010   Method used:   Electronically to        [facility] [address] (retail)       [address]       [place], [place]  [address]       Ph: [phone]       Fax: [phone]   RxID:  [id number]    Immunizations Administered:  Pneumonia Vaccine:    Vaccine Type: Pneumovax    Site: left deltoid    Mfr: Merck    Dose: 0.5 ml    Route: IM    Given by: Margaret Pyle, CMA    Exp. Date: 06/15/2011    Lot #: [id number]

## 2010-12-22 ENCOUNTER — Other Ambulatory Visit: Payer: Self-pay | Admitting: Endocrinology

## 2011-01-31 ENCOUNTER — Other Ambulatory Visit: Payer: Self-pay | Admitting: Endocrinology

## 2011-02-06 ENCOUNTER — Other Ambulatory Visit: Payer: Self-pay | Admitting: *Deleted

## 2011-02-06 MED ORDER — LOSARTAN POTASSIUM-HCTZ 100-25 MG PO TABS: 1.0000 | ORAL_TABLET | Freq: Every day | ORAL | Status: DC

## 2011-02-06 NOTE — Telephone Encounter (Signed)
R'cd fax from [facility] for refill of pt's Losartan-HCTZ  Last OV-02/04/2010  Last filled-01/05/2011

## 2011-02-24 ENCOUNTER — Other Ambulatory Visit: Payer: Self-pay | Admitting: Endocrinology

## 2011-03-23 ENCOUNTER — Other Ambulatory Visit: Payer: Self-pay | Admitting: *Deleted

## 2011-03-23 MED ORDER — PIOGLITAZONE HCL 15 MG PO TABS: 15.0000 mg | ORAL_TABLET | Freq: Every day | ORAL | Status: DC

## 2011-04-11 ENCOUNTER — Other Ambulatory Visit: Payer: Self-pay | Admitting: Endocrinology

## 2011-05-04 ENCOUNTER — Other Ambulatory Visit: Payer: Self-pay | Admitting: Endocrinology

## 2011-05-31 ENCOUNTER — Other Ambulatory Visit: Payer: Self-pay | Admitting: *Deleted

## 2011-05-31 MED ORDER — LOSARTAN POTASSIUM-HCTZ 100-25 MG PO TABS: 1.0000 | ORAL_TABLET | Freq: Every day | ORAL | Status: DC

## 2011-05-31 MED ORDER — AMLODIPINE BESYLATE 5 MG PO TABS: 5.0000 mg | ORAL_TABLET | Freq: Every day | ORAL | Status: DC

## 2011-05-31 NOTE — Telephone Encounter (Signed)
R'cd fax from [facility] for refill of Hyzaar and Amlodipine   Last OV-02/04/2010

## 2011-06-01 ENCOUNTER — Other Ambulatory Visit: Payer: Self-pay | Admitting: Endocrinology

## 2011-06-29 ENCOUNTER — Other Ambulatory Visit: Payer: Self-pay | Admitting: Endocrinology

## 2011-07-06 ENCOUNTER — Other Ambulatory Visit: Payer: Self-pay | Admitting: Endocrinology

## 2011-07-06 MED ORDER — SIMVASTATIN 40 MG PO TABS: 40.0000 mg | ORAL_TABLET | Freq: Every day | ORAL | Status: DC

## 2011-09-07 ENCOUNTER — Other Ambulatory Visit: Payer: Self-pay

## 2011-09-07 MED ORDER — LOSARTAN POTASSIUM-HCTZ 100-25 MG PO TABS: 1.0000 | ORAL_TABLET | Freq: Every day | ORAL | Status: DC

## 2011-09-07 MED ORDER — PIOGLITAZONE HCL 15 MG PO TABS: 15.0000 mg | ORAL_TABLET | Freq: Every day | ORAL | Status: DC

## 2011-09-07 MED ORDER — SIMVASTATIN 40 MG PO TABS: 40.0000 mg | ORAL_TABLET | Freq: Every day | ORAL | Status: DC

## 2011-09-07 MED ORDER — METOPROLOL SUCCINATE ER 50 MG PO TB24: 50.0000 mg | ORAL_TABLET | Freq: Every day | ORAL | Status: DC

## 2011-09-07 MED ORDER — AMLODIPINE BESYLATE 5 MG PO TABS: 5.0000 mg | ORAL_TABLET | Freq: Every day | ORAL | Status: DC

## 2011-09-07 NOTE — Telephone Encounter (Signed)
Pt advised no further refills without OV. Pt transferred to schedule appt.

## 2011-09-18 ENCOUNTER — Emergency Department (HOSPITAL_COMMUNITY)
Admission: EM | Admit: 2011-09-18 | Discharge: 2011-09-18 | Disposition: A | Discharge status: Home / Self Care | Payer: 59 | Attending: Emergency Medicine | Admitting: Emergency Medicine

## 2011-09-18 ENCOUNTER — Emergency Department (INDEPENDENT_AMBULATORY_CARE_PROVIDER_SITE_OTHER)
Admission: EM | Admit: 2011-09-18 | Discharge: 2011-09-18 | Disposition: A | Discharge status: Nursing Home (Includes ICF) | Payer: 59 | Source: Home / Self Care | Attending: Emergency Medicine | Admitting: Emergency Medicine

## 2011-09-18 ENCOUNTER — Encounter (HOSPITAL_COMMUNITY): Payer: Self-pay

## 2011-09-18 ENCOUNTER — Emergency Department (HOSPITAL_COMMUNITY): Payer: 59

## 2011-09-18 ENCOUNTER — Encounter (HOSPITAL_COMMUNITY): Payer: Self-pay | Admitting: Emergency Medicine

## 2011-09-18 DIAGNOSIS — N50812 Left testicular pain: Secondary | ICD-10-CM

## 2011-09-18 DIAGNOSIS — N509 Disorder of male genital organs, unspecified: Secondary | ICD-10-CM

## 2011-09-18 DIAGNOSIS — E119 Type 2 diabetes mellitus without complications: Secondary | ICD-10-CM | POA: Insufficient documentation

## 2011-09-18 DIAGNOSIS — Z79899 Other long term (current) drug therapy: Secondary | ICD-10-CM | POA: Insufficient documentation

## 2011-09-18 DIAGNOSIS — E7800 Pure hypercholesterolemia, unspecified: Secondary | ICD-10-CM | POA: Insufficient documentation

## 2011-09-18 DIAGNOSIS — N503 Cyst of epididymis: Secondary | ICD-10-CM

## 2011-09-18 DIAGNOSIS — I10 Essential (primary) hypertension: Secondary | ICD-10-CM | POA: Insufficient documentation

## 2011-09-18 DIAGNOSIS — N508 Other specified disorders of male genital organs: Secondary | ICD-10-CM | POA: Insufficient documentation

## 2011-09-18 HISTORY — DX: Pure hypercholesterolemia, unspecified: E78.00

## 2011-09-18 HISTORY — DX: Essential (primary) hypertension: I10

## 2011-09-18 IMAGING — US US SCROTUM
1 series · 14 of 25 positions shown · non-contrast
Comparison: 

CLINICAL DATA: Left testicle pain for 2 days.

SCROTAL ULTRASOUND
DOPPLER ULTRASOUND OF THE TESTICLES
TECHNIQUE: Complete ultrasound examination of the testicles,
epididymis, and other scrotal structures was performed.  Color and
spectral Doppler ultrasound were also utilized to evaluate blood
flow to the testicles.

[Series 1: us scrotum · 0.05mm/px · 14 of 70 slices shown]
[im 1/70]
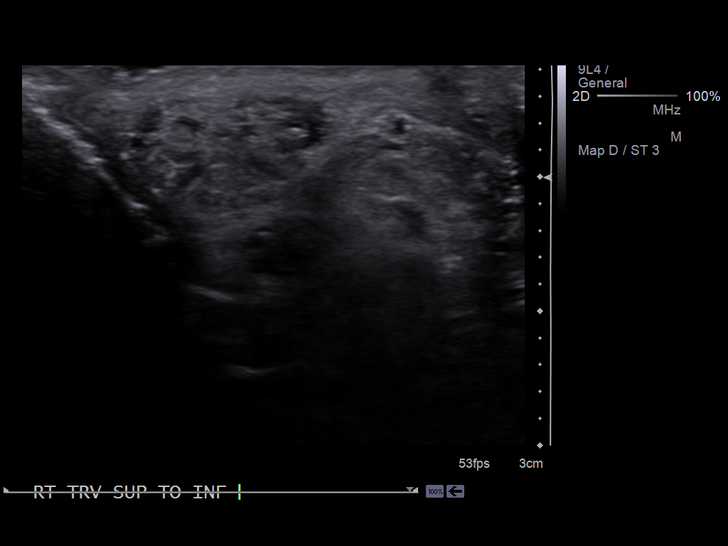
[im 6/70]
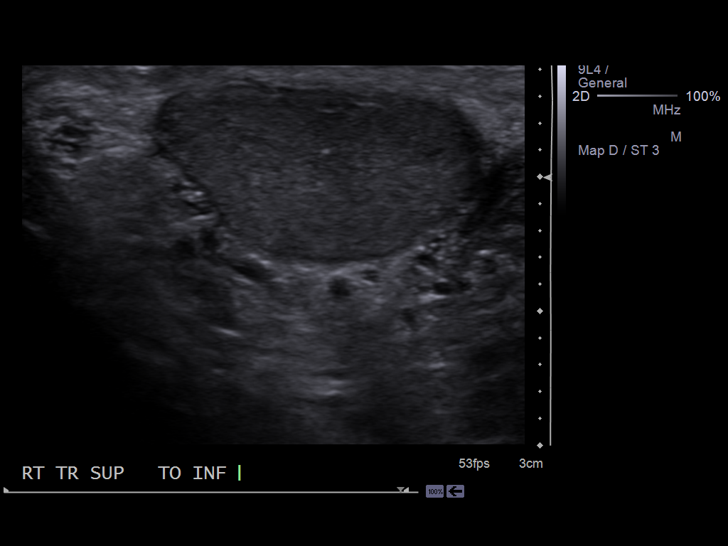
[im 12/70]
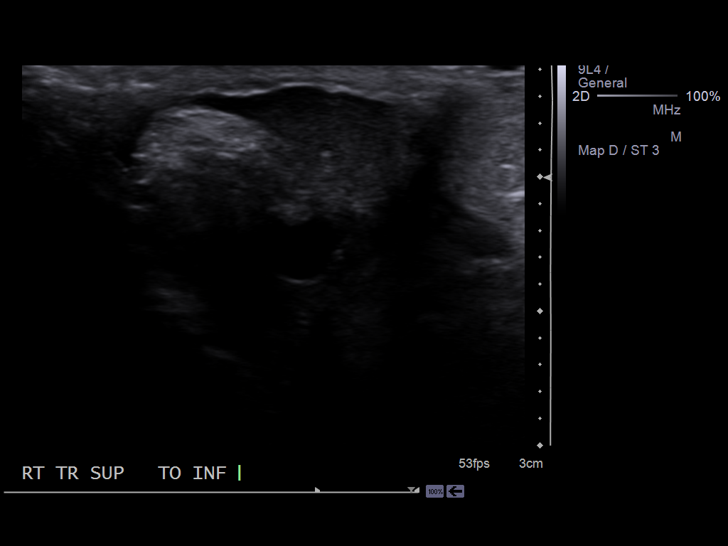
[im 18/70]
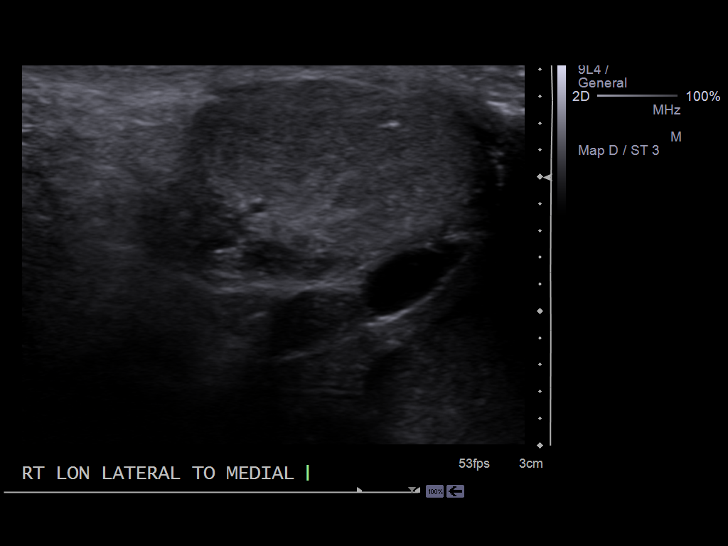
[im 24/70]
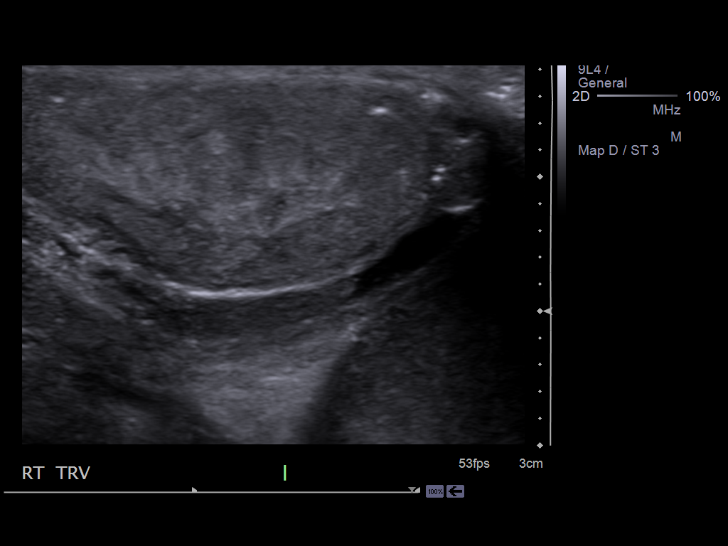
[im 26/70]
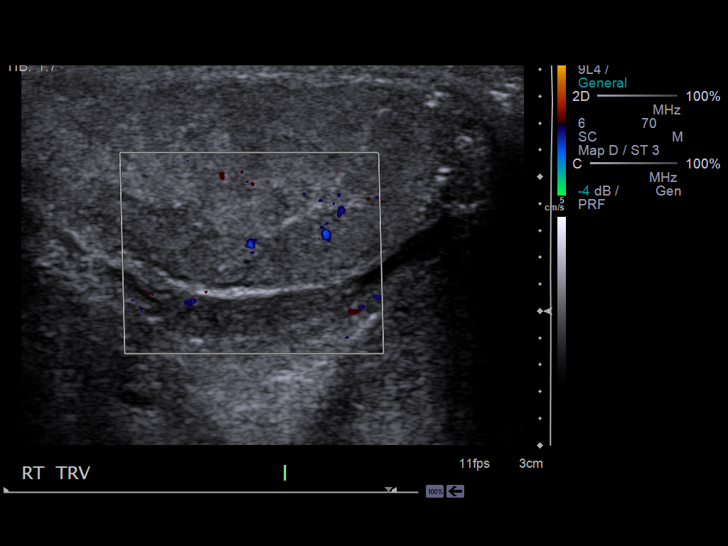
[im 32/70]
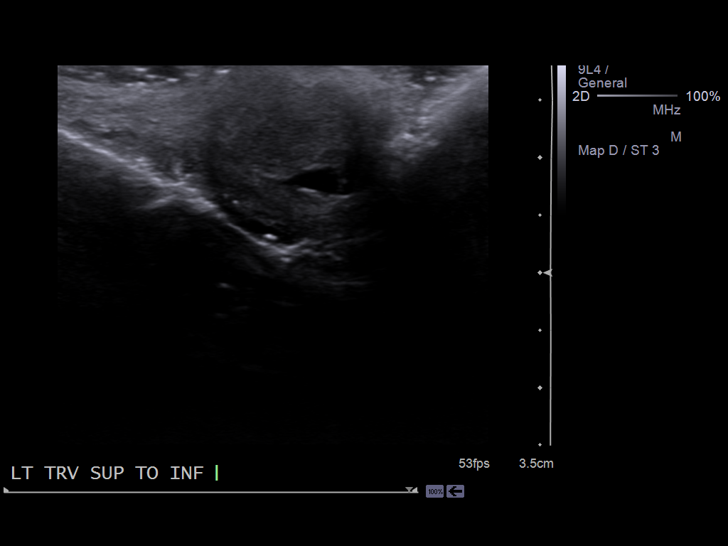
[im 38/70]
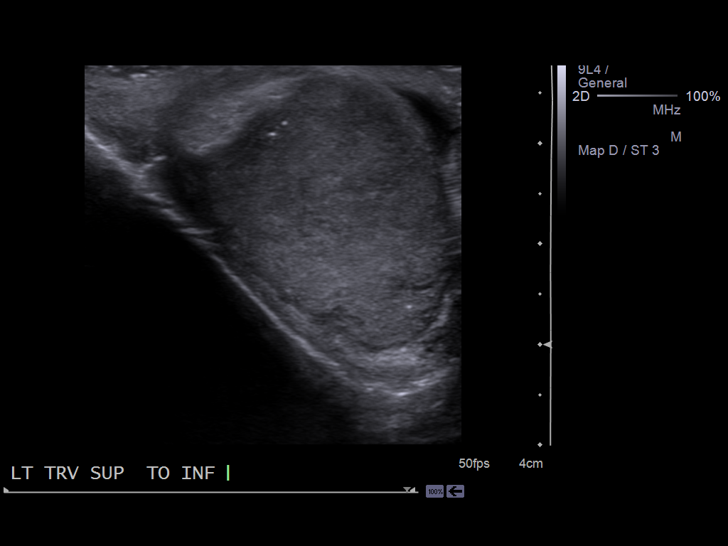
[im 44/70]
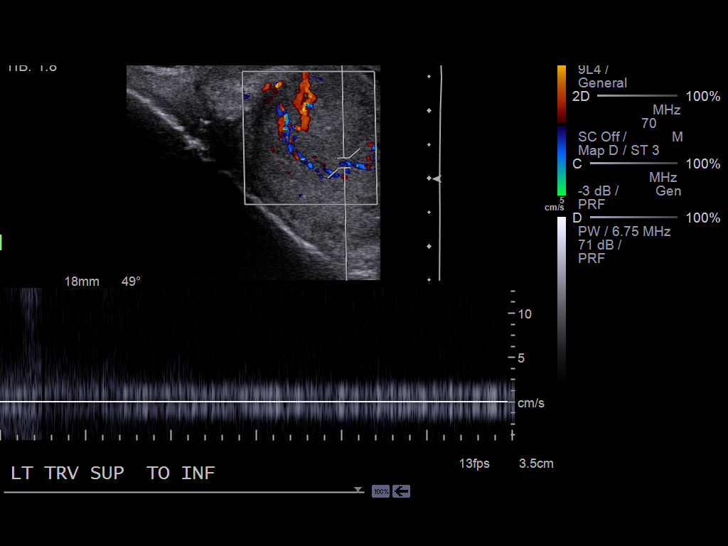
[im 47/70]
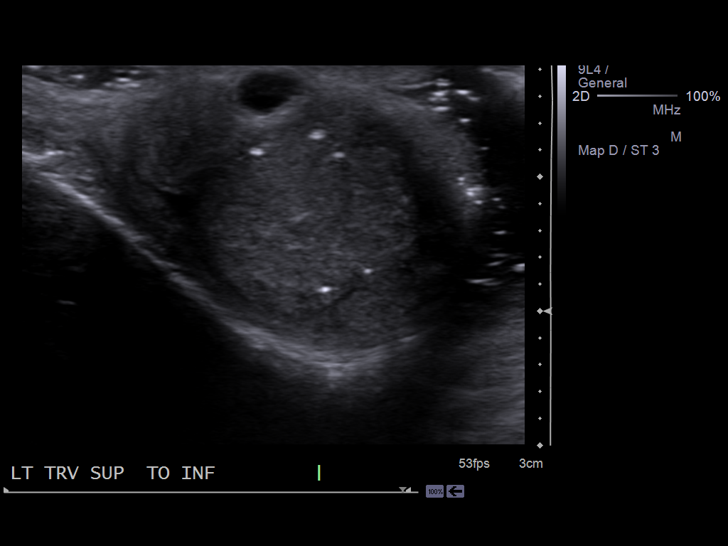
[im 52/70]
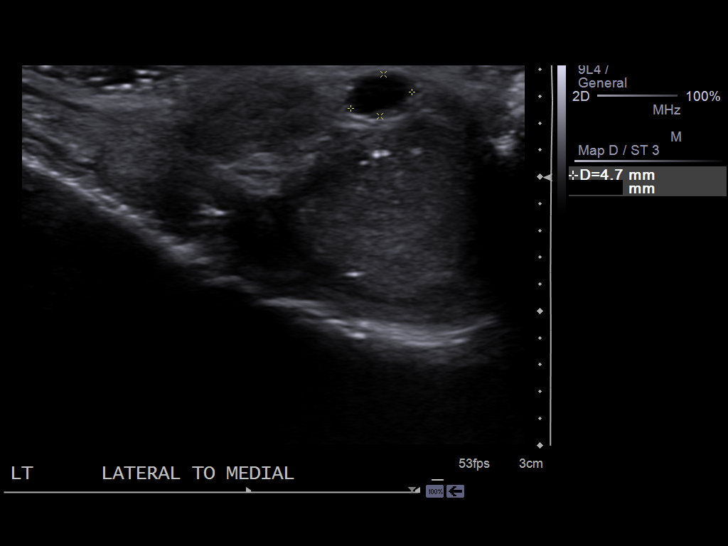
[im 58/70]
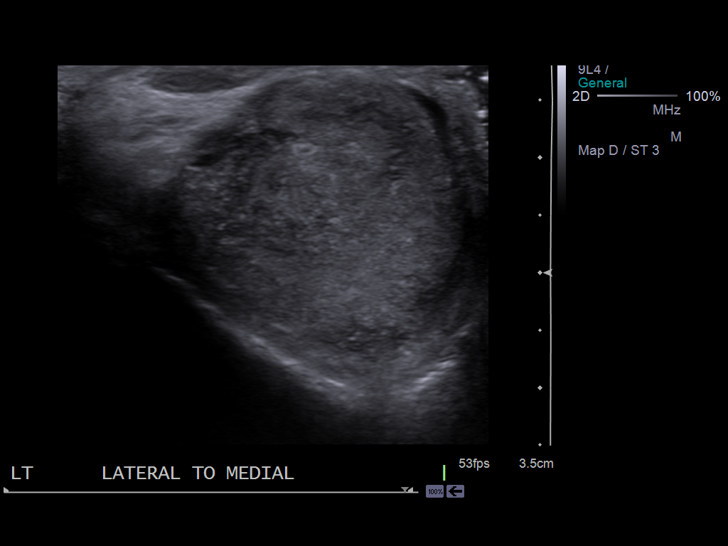
[im 64/70]
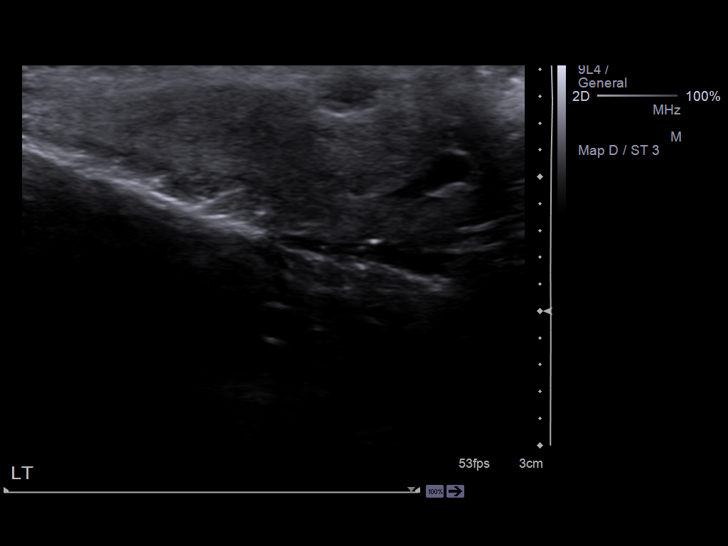
[im 70/70]
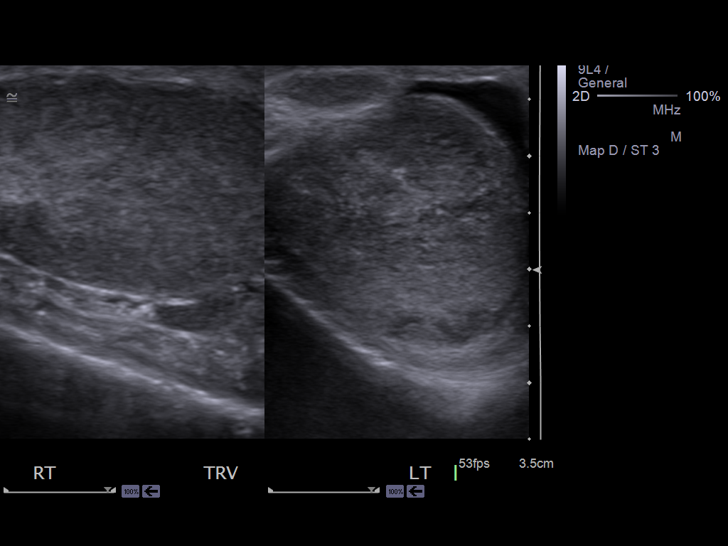

[14 of 25 positions shown; findings below may reference images not displayed]

FINDINGS: Right testis:  3.7 x 1.8 x 2.1 cm.  Normal arterial and venous
waveforms.  The testicle is slightly heterogeneous with
microlithiasis.

Left testis:  3.2 x 2.1 x 2.2 cm.  Normal arterial and venous
perfusion.  The testicle is heterogeneous with a few tiny
calcifications consistent with microlithiasis.

Right epididymis:  Normal.

Left epididymis:  Two small cysts in the left epididymis with the
largest being 3 mm in size.  The epididymis is slightly
heterogeneous but there is no increased perfusion to suggest
epididymitis.

Hydocele:  No significant hydroceles.

Varicocele:  Absent.

Pulsed Doppler interrogation of both testes demonstrates low
resistance flow bilaterally.
IMPRESSION: Two tiny cysts in the head of the left epididymis.  Bilateral
testicular microlithiasis.

## 2011-09-18 MED ORDER — TRAMADOL HCL 50 MG PO TABS: 50.0000 mg | ORAL_TABLET | Freq: Four times a day (QID) | ORAL | Status: AC | PRN

## 2011-09-18 MED ORDER — IBUPROFEN 200 MG PO TABS
400.0000 mg | ORAL_TABLET | Freq: Once | ORAL | Status: AC
  Administered 2011-09-18: 400 mg via ORAL
  Filled 2011-09-18: qty 2

## 2011-09-18 MED ORDER — OXYCODONE-ACETAMINOPHEN 5-325 MG PO TABS
1.0000 | ORAL_TABLET | Freq: Once | ORAL | Status: AC
  Administered 2011-09-18: 1 via ORAL
  Filled 2011-09-18: qty 1

## 2011-09-18 NOTE — ED Notes (Signed)
Pt having groin pain since Saturday. States he fell asleep on the couch wearing constrictive pants and when he woke up it hurt. The pain has gotten progressively worse and he now has nausea as well.

## 2011-09-18 NOTE — ED Notes (Signed)
groin pain began on Saturday, increases with movement. Denies any swelling.

## 2011-09-18 NOTE — ED Provider Notes (Signed)
History     CSN: [id number]  Arrival date & time 09/18/11  7829   First MD Initiated Contact with Patient 09/18/11 1004      Chief Complaint  Patient presents with  . Groin Pain    (Consider location/radiation/quality/duration/timing/severity/associated sxs/prior treatment) HPI Comments: Patient with  constant left testicle pain starting 2 days ago . Patient states that he slept "for about an hour" wearing tight pants, and when he woke up his left testicle was hurting. Today reports some nausea. No vomiting Has been applying ice with mild improvement. No fevers, testicular or scrotal swelling, redness, dysuria, urgency, frequency, penile discharge. No other trauma to the testicles. Sexually active with wife of many years, who is asymptomatic.   ROS as noted in HPI. All other ROS negative.   Patient is a 61 y.o. male presenting with testicular pain.  Testicle Pain This is a new problem. The current episode started 2 days ago. The problem occurs constantly. The problem has not changed since onset.He has tried a cold compress for the symptoms. The treatment provided mild relief.    Past Medical History  Diagnosis Date  . Hypertension   . Diabetes mellitus   . Hypercholesteremia     History reviewed. No pertinent past surgical history.  History reviewed. No pertinent family history.  History  Substance Use Topics  . Smoking status: Current Everyday Smoker -- 1.0 packs/day    Types: Cigarettes  . Smokeless tobacco: Not on file  . Alcohol Use: No      Review of Systems  Genitourinary: Positive for testicular pain.    Allergies  Enalapril maleate  Home Medications   Current Outpatient Rx  Name Route Sig Dispense Refill  . AMLODIPINE BESYLATE 5 MG PO TABS Oral Take 10 mg by mouth daily.    Marland Kitchen LOSARTAN POTASSIUM-HCTZ 100-25 MG PO TABS Oral Take 1 tablet by mouth daily. 30 tablet 0    Pt MUST make office visit for further refills  . METOPROLOL SUCCINATE ER 50 MG PO  TB24 Oral Take 25 mg by mouth daily. Take with or immediately following a meal.    . SIMVASTATIN 40 MG PO TABS Oral Take 80 mg by mouth at bedtime.    Marland Kitchen PIOGLITAZONE HCL 15 MG PO TABS Oral Take 1 tablet (15 mg total) by mouth daily. 30 tablet 0    Pt is due for F/U OV with MD for additional refill ...    BP 173/95  Pulse 82  Temp(Src) 98 F (36.7 C) (Oral)  Resp 20  SpO2 100%  Physical Exam  Nursing note and vitals reviewed. Constitutional: He is oriented to person, place, and time. He appears well-developed and well-nourished.  HENT:  Head: Normocephalic and atraumatic.  Eyes: Conjunctivae and EOM are normal.  Neck: Normal range of motion.  Cardiovascular: Normal rate.   Pulmonary/Chest: Effort normal.  Abdominal: He exhibits no distension. Hernia confirmed negative in the left inguinal area.  Genitourinary: Penis normal. Cremasteric reflex is present. Left testis shows tenderness. Left testis shows no mass and no swelling.       Patient declined chaperone. No epididymal tenderness. Diffuse testicular tenderness.  Musculoskeletal: Normal range of motion.  Lymphadenopathy:       Left: No inguinal adenopathy present.  Neurological: He is alert and oriented to person, place, and time.  Skin: Skin is warm and dry.  Psychiatric: He has a normal mood and affect. His behavior is normal.    ED Course  Procedures (including critical  care time)  Labs Reviewed - No data to display No results found.   No diagnosis found.  Dx: L testicular pain  MDM  Previous chart, labs, imaging reviewed.   Patient with 2 days of constant testicular pain. Does not appear to be orchitis, epididymitis, hernia. Concern for torsion versus decreased blood flow. Transferring ED for ultrasound.  Luiz Blare, MD 09/18/11 1055

## 2011-09-27 NOTE — ED Provider Notes (Signed)
History    61 year old male with left testicular pain. Gradual onset about 2 days ago. He noticed when he was laying on the couch initially due to wear tight pants. Pain has persisted.. No vomiting. No urinary complaints. Was evaluated in urgent care and referred to the emergency room. No fevers, sweats or chills. Denies trauma. No scrotal swelling. No penile discahrge.No rash.   CSN: [id number]  Arrival date & time 09/18/11  1131   First MD Initiated Contact with Patient 09/18/11 1148      Chief Complaint  Patient presents with  . Groin Pain    (Consider location/radiation/quality/duration/timing/severity/associated sxs/prior treatment) HPI  Past Medical History  Diagnosis Date  . Hypertension   . Diabetes mellitus   . Hypercholesteremia     History reviewed. No pertinent past surgical history.  No family history on file.  History  Substance Use Topics  . Smoking status: Current Everyday Smoker -- 1.0 packs/day    Types: Cigarettes  . Smokeless tobacco: Not on file  . Alcohol Use: No      Review of Systems   Review of symptoms negative unless otherwise noted in HPI.   Allergies  Review of patient's allergies indicates no known allergies.  Home Medications   Current Outpatient Rx  Name Route Sig Dispense Refill  . AMLODIPINE BESYLATE 5 MG PO TABS Oral Take 5 mg by mouth daily.     Marland Kitchen LOSARTAN POTASSIUM-HCTZ 100-25 MG PO TABS Oral Take 1 tablet by mouth daily. 30 tablet 0    Pt MUST make office visit for further refills  . METOPROLOL SUCCINATE ER 50 MG PO TB24 Oral Take 25 mg by mouth daily. Take with or immediately following a meal.    . PIOGLITAZONE HCL 15 MG PO TABS Oral Take 1 tablet (15 mg total) by mouth daily. 30 tablet 0    Pt is due for F/U OV with MD for additional refill ...  . SIMVASTATIN 40 MG PO TABS Oral Take 40 mg by mouth at bedtime.     . TRAMADOL HCL 50 MG PO TABS Oral Take 1 tablet (50 mg total) by mouth every 6 (six) hours as needed for  pain. 15 tablet 0    BP 171/103  Pulse 83  Temp(Src) 97.9 F (36.6 C) (Oral)  Resp 20  Ht 5\' 11"  (1.803 m)  Wt 221 lb (100.245 kg)  BMI 30.82 kg/m2  SpO2 98%  Physical Exam  Nursing note and vitals reviewed. Constitutional: He appears well-developed and well-nourished. No distress.  HENT:  Head: Normocephalic and atraumatic.  Eyes: Conjunctivae are normal. Right eye exhibits no discharge. Left eye exhibits no discharge.  Neck: Neck supple.  Cardiovascular: Normal rate, regular rhythm and normal heart sounds.  Exam reveals no gallop and no friction rub.   No murmur heard. Pulmonary/Chest: Effort normal and breath sounds normal. No respiratory distress.  Abdominal: Soft. He exhibits no distension. There is no tenderness.  Genitourinary:       Normal appearing external male genitalia. No external lesions noted. No inguinal adenopathy. There is no penile discharge. Testicles have normal lie. The left testicle has mild tenderness of the epididymis. No scrotal swelling. Intact cremasteric reflexes bilaterally. No hernia appreciated.  Musculoskeletal: He exhibits no edema and no tenderness.  Neurological: He is alert.  Skin: Skin is warm and dry.  Psychiatric: He has a normal mood and affect. His behavior is normal. Thought content normal.    ED Course  Procedures (including critical care time)  Labs Reviewed - No data to display No results found.  US Scrotum  09/18/2011  *RADIOLOGY REPORT*  Clinical Data:  Left testicle pain for 2 days.  SCROTAL ULTRASOUND DOPPLER ULTRASOUND OF THE TESTICLES  Technique: Complete ultrasound examination of the testicles, epididymis, and other scrotal structures was performed.  Color and spectral Doppler ultrasound were also utilized to evaluate blood flow to the testicles.  Comparison:  Findings:  Right testis:  3.7 x 1.8 x 2.1 cm.  Normal arterial and venous waveforms.  The testicle is slightly heterogeneous with microlithiasis.  Left testis:  3.2 x  2.1 x 2.2 cm.  Normal arterial and venous perfusion.  The testicle is heterogeneous with a few tiny calcifications consistent with microlithiasis.  Right epididymis:  Normal.  Left epididymis:  Two small cysts in the left epididymis with the largest being 3 mm in size.  The epididymis is slightly heterogeneous but there is no increased perfusion to suggest epididymitis.  Hydocele:  No significant hydroceles.  Varicocele:  Absent.  Pulsed Doppler interrogation of both testes demonstrates low resistance flow bilaterally.  IMPRESSION: Two tiny cysts in the head of the left epididymis.  Bilateral testicular microlithiasis.  Original Report Authenticated By: Gwynn Burly, M.D.   [place] Art/ven Flow Abd Pelv Doppler  09/18/2011  *RADIOLOGY REPORT*  Clinical Data:  Left testicle pain for 2 days.  SCROTAL ULTRASOUND DOPPLER ULTRASOUND OF THE TESTICLES  Technique: Complete ultrasound examination of the testicles, epididymis, and other scrotal structures was performed.  Color and spectral Doppler ultrasound were also utilized to evaluate blood flow to the testicles.  Comparison:  Findings:  Right testis:  3.7 x 1.8 x 2.1 cm.  Normal arterial and venous waveforms.  The testicle is slightly heterogeneous with microlithiasis.  Left testis:  3.2 x 2.1 x 2.2 cm.  Normal arterial and venous perfusion.  The testicle is heterogeneous with a few tiny calcifications consistent with microlithiasis.  Right epididymis:  Normal.  Left epididymis:  Two small cysts in the left epididymis with the largest being 3 mm in size.  The epididymis is slightly heterogeneous but there is no increased perfusion to suggest epididymitis.  Hydocele:  No significant hydroceles.  Varicocele:  Absent.  Pulsed Doppler interrogation of both testes demonstrates low resistance flow bilaterally.  IMPRESSION: Two tiny cysts in the head of the left epididymis.  Bilateral testicular microlithiasis.  Original Report Authenticated By: Gwynn Burly, M.D.     1. Epididymal cyst       MDM  61 year old male with left testicular pain. US showed two small epididymal cysts. No evidence of torsion. Physical exam unremarkable aside from mild tenderness. Plan outpt fu.        Raeford Razor, MD 09/28/11 [phone]

## 2011-10-06 ENCOUNTER — Other Ambulatory Visit: Payer: Self-pay

## 2011-10-06 ENCOUNTER — Other Ambulatory Visit: Payer: Self-pay | Admitting: Endocrinology

## 2011-10-06 MED ORDER — METOPROLOL SUCCINATE ER 50 MG PO TB24: 25.0000 mg | ORAL_TABLET | Freq: Every day | ORAL | Status: DC

## 2011-10-06 MED ORDER — LOSARTAN POTASSIUM-HCTZ 100-25 MG PO TABS: 1.0000 | ORAL_TABLET | Freq: Every day | ORAL | Status: DC

## 2011-10-06 MED ORDER — SIMVASTATIN 40 MG PO TABS: 40.0000 mg | ORAL_TABLET | Freq: Every day | ORAL | Status: DC

## 2011-10-06 MED ORDER — PIOGLITAZONE HCL 15 MG PO TABS: 15.0000 mg | ORAL_TABLET | Freq: Every day | ORAL | Status: DC

## 2011-10-06 MED ORDER — AMLODIPINE BESYLATE 5 MG PO TABS: 5.0000 mg | ORAL_TABLET | Freq: Every day | ORAL | Status: DC

## 2011-10-06 NOTE — Telephone Encounter (Signed)
Pt called requesting refills until OV 02/22

## 2011-10-09 ENCOUNTER — Other Ambulatory Visit: Payer: Self-pay | Admitting: *Deleted

## 2011-10-09 MED ORDER — METOPROLOL SUCCINATE ER 50 MG PO TB24: ORAL_TABLET | ORAL | Status: DC

## 2011-10-09 NOTE — Telephone Encounter (Signed)
R'cd fax from [facility] for refill of Toprol XL-correct dosage is 25mg  qd, rx sent to [facility].

## 2011-10-13 ENCOUNTER — Other Ambulatory Visit (INDEPENDENT_AMBULATORY_CARE_PROVIDER_SITE_OTHER): Payer: 59

## 2011-10-13 ENCOUNTER — Encounter: Payer: Self-pay | Admitting: Endocrinology

## 2011-10-13 ENCOUNTER — Ambulatory Visit (INDEPENDENT_AMBULATORY_CARE_PROVIDER_SITE_OTHER): Payer: 59 | Admitting: Endocrinology

## 2011-10-13 DIAGNOSIS — Z79899 Other long term (current) drug therapy: Secondary | ICD-10-CM

## 2011-10-13 DIAGNOSIS — I10 Essential (primary) hypertension: Secondary | ICD-10-CM

## 2011-10-13 DIAGNOSIS — E7800 Pure hypercholesterolemia, unspecified: Secondary | ICD-10-CM

## 2011-10-13 DIAGNOSIS — Z125 Encounter for screening for malignant neoplasm of prostate: Secondary | ICD-10-CM

## 2011-10-13 DIAGNOSIS — E119 Type 2 diabetes mellitus without complications: Secondary | ICD-10-CM | POA: Insufficient documentation

## 2011-10-13 DIAGNOSIS — E1129 Type 2 diabetes mellitus with other diabetic kidney complication: Secondary | ICD-10-CM

## 2011-10-13 DIAGNOSIS — N058 Unspecified nephritic syndrome with other morphologic changes: Secondary | ICD-10-CM

## 2011-10-13 DIAGNOSIS — N529 Male erectile dysfunction, unspecified: Secondary | ICD-10-CM

## 2011-10-13 DIAGNOSIS — R809 Proteinuria, unspecified: Secondary | ICD-10-CM

## 2011-10-13 LAB — CBC WITH DIFFERENTIAL/PLATELET
Basophils Relative: 0.3 % (ref 0.0–3.0)
Eosinophils Relative: 1.1 % (ref 0.0–5.0)
Lymphocytes Relative: 33.8 % (ref 12.0–46.0)
Monocytes Relative: 10 % (ref 3.0–12.0)
Neutrophils Relative %: 54.8 % (ref 43.0–77.0)
RBC: 5.96 Mil/uL — ABNORMAL HIGH (ref 4.22–5.81)
WBC: 8.5 10*3/uL (ref 4.5–10.5)

## 2011-10-13 LAB — URINALYSIS, ROUTINE W REFLEX MICROSCOPIC
Bilirubin Urine: NEGATIVE
Ketones, ur: NEGATIVE
Leukocytes, UA: NEGATIVE
pH: 6 (ref 5.0–8.0)

## 2011-10-13 LAB — LIPID PANEL
HDL: 49.2 mg/dL (ref >39.00)
LDL Cholesterol: 58 mg/dL (ref 0–99)
Total CHOL/HDL Ratio: 2
Triglycerides: 43 mg/dL (ref 0.0–149.0)
VLDL: 8.6 mg/dL (ref 0.0–40.0)

## 2011-10-13 LAB — HEMOGLOBIN A1C: Hgb A1c MFr Bld: 5.9 % (ref 4.6–6.5)

## 2011-10-13 LAB — BASIC METABOLIC PANEL
BUN: 20 mg/dL (ref 6–23)
Chloride: 98 mEq/L (ref 96–112)
Potassium: 3.5 mEq/L (ref 3.5–5.1)

## 2011-10-13 LAB — MICROALBUMIN / CREATININE URINE RATIO: Creatinine,U: 129.8 mg/dL

## 2011-10-13 LAB — TESTOSTERONE: Testosterone: 268.93 ng/dL — ABNORMAL LOW (ref 350.00–890.00)

## 2011-10-13 LAB — HEPATIC FUNCTION PANEL
Albumin: 3.9 g/dL (ref 3.5–5.2)
Total Protein: 6.9 g/dL (ref 6.0–8.3)

## 2011-10-13 MED ORDER — SIMVASTATIN 40 MG PO TABS: 40.0000 mg | ORAL_TABLET | Freq: Every day | ORAL | Status: DC

## 2011-10-13 MED ORDER — LOSARTAN POTASSIUM-HCTZ 100-25 MG PO TABS: 1.0000 | ORAL_TABLET | Freq: Every day | ORAL | Status: DC

## 2011-10-13 MED ORDER — PIOGLITAZONE HCL 15 MG PO TABS: 15.0000 mg | ORAL_TABLET | Freq: Every day | ORAL | Status: DC

## 2011-10-13 MED ORDER — METOPROLOL SUCCINATE ER 50 MG PO TB24: ORAL_TABLET | ORAL | Status: DC

## 2011-10-13 NOTE — Patient Instructions (Addendum)
blood tests are being requested for you today.  please call [phone] to hear your test results.  You will be prompted to enter the 9-digit "MRN" number that appears at the top left of this page, followed by #.  Then you will hear the message. Please come in soon for a regular physical. Refer to a foot specialist.  you will receive a phone call, about a day and time for an appointment (update: i left message on phone-tree:  rx as we discussed).

## 2011-10-13 NOTE — Progress Notes (Signed)
  Subjective:    Patient ID: Roberto Rivera, male    DOB: 03-08-1951, 61 y.o.   MRN: [id number]  HPI Pt states few mos of moderate "lump" on the right foot, but no assoc pain. . Past Medical History  Diagnosis Date  . Hypertension   . Diabetes mellitus   . Hypercholesteremia   . ALLERGIC RHINITIS 04/01/2007  . DIABETES MELLITUS, TYPE II 04/01/2007  . 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 HYPERCHOLESTEROLEMIA 02/07/2008  . HYPERTENSION 04/01/2007  . MICROCYTOSIS 02/07/2008  . OSTEOARTHRITIS, SPINE 02/07/2008  . THYROID CYST 02/07/2008    No past surgical history on file.  History   Social History  . Marital Status: Married    Spouse Name: N/A    Number of Children: N/A  . Years of Education: N/A   Occupational History  . [employer]   Social History Main Topics  . Smoking status: Current Everyday Smoker -- 1.0 packs/day    Types: Cigarettes  . Smokeless tobacco: Not on file  . Alcohol Use: No  . Drug Use: No  . Sexually Active:    Other Topics Concern  . Not on file   Social History Narrative   Divorced x many years    Current Outpatient Prescriptions on File Prior to Visit  Medication Sig Dispense Refill  . amLODipine (NORVASC) 5 MG tablet Take 1 tablet (5 mg total) by mouth daily.  30 tablet  0    No Known Allergies  Family History  Problem Relation Age of Onset  . Cancer Mother     had uncertain type of cancer    BP 122/82  Pulse 70  Temp(Src) 98.3 F (36.8 C) (Oral)  Ht 5\' 11"  (1.803 m)  Wt 213 lb 12.8 oz (96.979 kg)  BMI 29.82 kg/m2  SpO2 98%    Review of Systems Denies chest pain/sob.  He has lost a few lbs, due to his efforts    Objective:   Physical Exam VITAL SIGNS:  See vs page GENERAL: no distress Pulses: dorsalis pedis intact bilat.   Feet: no deformity.  no ulcer on the feet.  feet are of normal color and temp.  no edema.  At the plantar aspect of the right foot, there is a 1 cm nodule (callus vs condyloma) Neuro:  sensation is intact to touch on the feet    Lab Results  Component Value Date   WBC 8.5 10/13/2011   HGB 15.5 10/13/2011   HCT 48.2 10/13/2011   PLT 193.0 10/13/2011   GLUCOSE 84 10/13/2011   CHOL 116 10/13/2011   TRIG 43.0 10/13/2011   HDL 49.20 10/13/2011   LDLCALC 58 10/13/2011   ALT 19 10/13/2011   AST 23 10/13/2011   NA 137 10/13/2011   K 3.5 10/13/2011   CL 98 10/13/2011   CREATININE 1.3 10/13/2011   BUN 20 10/13/2011   CO2 30 10/13/2011   TSH 0.84 10/13/2011   PSA 0.72 10/13/2011   HGBA1C 5.9 10/13/2011   MICROALBUR 11.9* 10/13/2011      Assessment & Plan:  Foot nodule, new.  uncertain etiology Microalbuminuria, persistent Dyslipidemia, well-controlled HTN, well-controlled

## 2011-11-06 ENCOUNTER — Other Ambulatory Visit: Payer: Self-pay | Admitting: Endocrinology

## 2011-11-10 ENCOUNTER — Encounter: Payer: 59 | Admitting: Endocrinology

## 2011-12-14 ENCOUNTER — Ambulatory Visit (INDEPENDENT_AMBULATORY_CARE_PROVIDER_SITE_OTHER): Payer: 59 | Admitting: Endocrinology

## 2011-12-14 ENCOUNTER — Encounter: Payer: Self-pay | Admitting: Endocrinology

## 2011-12-14 VITALS — BP 142/82 | HR 64 | Temp 98.2°F | Ht 71.0 in | Wt 216.0 lb

## 2011-12-14 DIAGNOSIS — E1129 Type 2 diabetes mellitus with other diabetic kidney complication: Secondary | ICD-10-CM

## 2011-12-14 DIAGNOSIS — Z23 Encounter for immunization: Secondary | ICD-10-CM

## 2011-12-14 DIAGNOSIS — Z2911 Encounter for prophylactic immunotherapy for respiratory syncytial virus (RSV): Secondary | ICD-10-CM

## 2011-12-14 DIAGNOSIS — N058 Unspecified nephritic syndrome with other morphologic changes: Secondary | ICD-10-CM

## 2011-12-14 NOTE — Progress Notes (Signed)
Subjective:    Patient ID: Roberto Rivera, male    DOB: Jun 19, 1951, 61 y.o.   MRN: [id number]  HPI here for regular wellness examination.  He's feeling pretty well in general, and says chronic med probs are stable, except as noted below Past Medical History  Diagnosis Date  . Hypertension   . Diabetes mellitus   . Hypercholesteremia   . ALLERGIC RHINITIS 04/01/2007  . DIABETES MELLITUS, TYPE II 04/01/2007  . 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 HYPERCHOLESTEROLEMIA 02/07/2008  . HYPERTENSION 04/01/2007  . MICROCYTOSIS 02/07/2008  . OSTEOARTHRITIS, SPINE 02/07/2008  . THYROID CYST 02/07/2008    No past surgical history on file.  History   Social History  . Marital Status: Married    Spouse Name: N/A    Number of Children: N/A  . Years of Education: N/A   Occupational History  . [employer]   Social History Main Topics  . Smoking status: Current Everyday Smoker -- 1.0 packs/day    Types: Cigarettes  . Smokeless tobacco: Not on file  . Alcohol Use: No  . Drug Use: No  . Sexually Active:    Other Topics Concern  . Not on file   Social History Narrative   Divorced x many years    Current Outpatient Prescriptions on File Prior to Visit  Medication Sig Dispense Refill  . amLODipine (NORVASC) 5 MG tablet TAKE ONE TABLET BY MOUTH ONE TIME DAILY  30 tablet  10  . glucose blood (ONE TOUCH ULTRA TEST) test strip Use as instructed once daily and lancets 250.00      . losartan-hydrochlorothiazide (HYZAAR) 100-25 MG per tablet Take 1 tablet by mouth daily.  30 tablet  11  . metoprolol succinate (TOPROL-XL) 50 MG 24 hr tablet Take 1/2 tablet by mouth once daily  30 tablet  5  . pioglitazone (ACTOS) 15 MG tablet TAKE ONE TABLET BY MOUTH ONE TIME DAILY  30 tablet  10  . simvastatin (ZOCOR) 40 MG tablet Take 1 tablet (40 mg total) by mouth at bedtime.  30 tablet  11  . tadalafil (CIALIS) 20 MG tablet Take 20 mg by mouth as needed.        No Known Allergies  Family  History  Problem Relation Age of Onset  . Cancer Mother     had uncertain type of cancer    BP 142/82  Pulse 64  Temp(Src) 98.2 F (36.8 C) (Oral)  Ht 5\' 11"  (1.803 m)  Wt 216 lb (97.977 kg)  BMI 30.13 kg/m2  SpO2 99%     Review of Systems  Constitutional: Negative for fever and unexpected weight change.  HENT: Negative for hearing loss.   Eyes: Negative for visual disturbance.  Respiratory: Negative for shortness of breath.   Cardiovascular: Negative for chest pain.  Gastrointestinal: Negative for anal bleeding.  Genitourinary: Negative for hematuria and difficulty urinating.  Musculoskeletal: Negative for gait problem.  Skin: Negative for rash.  Neurological: Negative for syncope and numbness.  Hematological: Does not bruise/bleed easily.  Psychiatric/Behavioral: Negative for dysphoric mood.      Objective:   Physical Exam VS: see vs page GEN: no distress HEAD: head: no deformity eyes: no periorbital swelling, no proptosis external nose and ears are normal mouth: no lesion seen NECK: supple, thyroid is not enlarged CHEST WALL: no deformity LUNGS: clear to auscultation CV: reg rate and rhythm, no murmur ABD: abdomen is soft, nontender.  no hepatosplenomegaly.  not distended.  no hernia RECTAL: normal external  and internal exam.  heme neg. PROSTATE:  Normal size.  No nodule MUSCULOSKELETAL: muscle bulk and strength are grossly normal.  no obvious joint swelling.  gait is normal and steady EXTEMITIES: no deformity.  no ulcer on the feet.  feet are of normal color and temp.  no edema PULSES: dorsalis pedis intact bilat.  no carotid bruit NEURO:  cn 2-12 grossly intact.   readily moves all 4's.  sensation is intact to touch on the feet SKIN:  Normal texture and temperature.  No rash or suspicious lesion is visible.   NODES:  None palpable at the neck.   PSYCH: alert, oriented x3.  Does not appear anxious nor depressed.       Assessment & Plan:  Wellness visit  today, with problems stable, except as noted.

## 2011-12-14 NOTE — Patient Instructions (Addendum)
Please come back for a follow-up appointment in 6 months.  We'll recheck blood pressure then please consider these measures for your health:  minimize alcohol.  do not use tobacco products.  have a colonoscopy at least every 10 years from age 60.  keep firearms safely stored.  always use seat belts.  have working smoke alarms in your home.  see an eye doctor and dentist regularly.  never drive under the influence of alcohol or drugs (including prescription drugs).   please let me know what your wishes would be, if artificial life support measures should become necessary.  it is critically important to prevent falling down (keep floor areas well-lit, dry, and free of loose objects.  If you have a cane, walker, or wheelchair, you should use it, even for short trips around the house.  Also, try not to rush).  Please let me know if you decide to get the colonoscopy.  This reduces your chances of getting colon cancer.

## 2012-06-12 ENCOUNTER — Other Ambulatory Visit: Payer: Self-pay | Admitting: General Practice

## 2012-10-28 ENCOUNTER — Other Ambulatory Visit: Payer: Self-pay | Admitting: *Deleted

## 2012-10-28 MED ORDER — SIMVASTATIN 40 MG PO TABS: 40.0000 mg | ORAL_TABLET | Freq: Every day | ORAL | Status: DC

## 2012-10-28 MED ORDER — LOSARTAN POTASSIUM-HCTZ 100-25 MG PO TABS: 1.0000 | ORAL_TABLET | Freq: Every day | ORAL | Status: DC

## 2012-10-28 MED ORDER — METOPROLOL SUCCINATE ER 50 MG PO TB24: ORAL_TABLET | ORAL | Status: DC

## 2012-11-26 ENCOUNTER — Telehealth: Payer: Self-pay | Admitting: Endocrinology

## 2012-11-26 NOTE — Telephone Encounter (Signed)
recv'd request for diabetes testing supplies. Per Dr. Everardo All, pt needs OV first. Victorino Dike called pt and LM on VM 09/30/12. To date, pt has not called back to sch. Tried to call again today, got no answer / Sherri S.

## 2012-12-25 ENCOUNTER — Other Ambulatory Visit: Payer: Self-pay

## 2012-12-25 MED ORDER — LOSARTAN POTASSIUM-HCTZ 100-25 MG PO TABS: 1.0000 | ORAL_TABLET | Freq: Every day | ORAL | Status: DC

## 2012-12-25 MED ORDER — SIMVASTATIN 40 MG PO TABS: 40.0000 mg | ORAL_TABLET | Freq: Every day | ORAL | Status: DC

## 2012-12-30 ENCOUNTER — Telehealth: Payer: Self-pay | Admitting: Endocrinology

## 2012-12-30 NOTE — Telephone Encounter (Signed)
Patient left a VM asking for CB about medication refill.

## 2012-12-31 NOTE — Telephone Encounter (Signed)
Pt's wife advised pt needs to make an appt for further refills 2 rx's were sent last week

## 2013-01-02 ENCOUNTER — Telehealth: Payer: Self-pay | Admitting: Endocrinology

## 2013-01-02 NOTE — Telephone Encounter (Signed)
Pt called about refill request, please call [phone] / Sherri S.

## 2013-01-03 NOTE — Telephone Encounter (Signed)
Pt's wife states she does not know the name of med pt needs will have pt call us back

## 2013-02-27 ENCOUNTER — Other Ambulatory Visit: Payer: Self-pay | Admitting: Endocrinology

## 2013-02-27 ENCOUNTER — Other Ambulatory Visit: Payer: Self-pay | Admitting: *Deleted

## 2013-02-27 MED ORDER — LOSARTAN POTASSIUM-HCTZ 100-25 MG PO TABS: 1.0000 | ORAL_TABLET | Freq: Every day | ORAL | Status: DC

## 2013-02-28 ENCOUNTER — Other Ambulatory Visit: Payer: Self-pay | Admitting: *Deleted

## 2013-02-28 MED ORDER — METOPROLOL SUCCINATE ER 50 MG PO TB24: ORAL_TABLET | ORAL | Status: DC

## 2013-03-06 ENCOUNTER — Other Ambulatory Visit: Payer: Self-pay | Admitting: Endocrinology

## 2013-03-07 ENCOUNTER — Other Ambulatory Visit: Payer: Self-pay | Admitting: *Deleted

## 2013-03-07 MED ORDER — SIMVASTATIN 40 MG PO TABS: 40.0000 mg | ORAL_TABLET | Freq: Every day | ORAL | Status: DC

## 2013-03-11 IMAGING — CR DG KNEE COMPLETE 4+V*L*
4 series · 4 of 4 positions shown · non-contrast
Comparison: Femur examination.

CLINICAL DATA: History of injury from fall.

LEFT KNEE - COMPLETE 4+ VIEW

[AP]
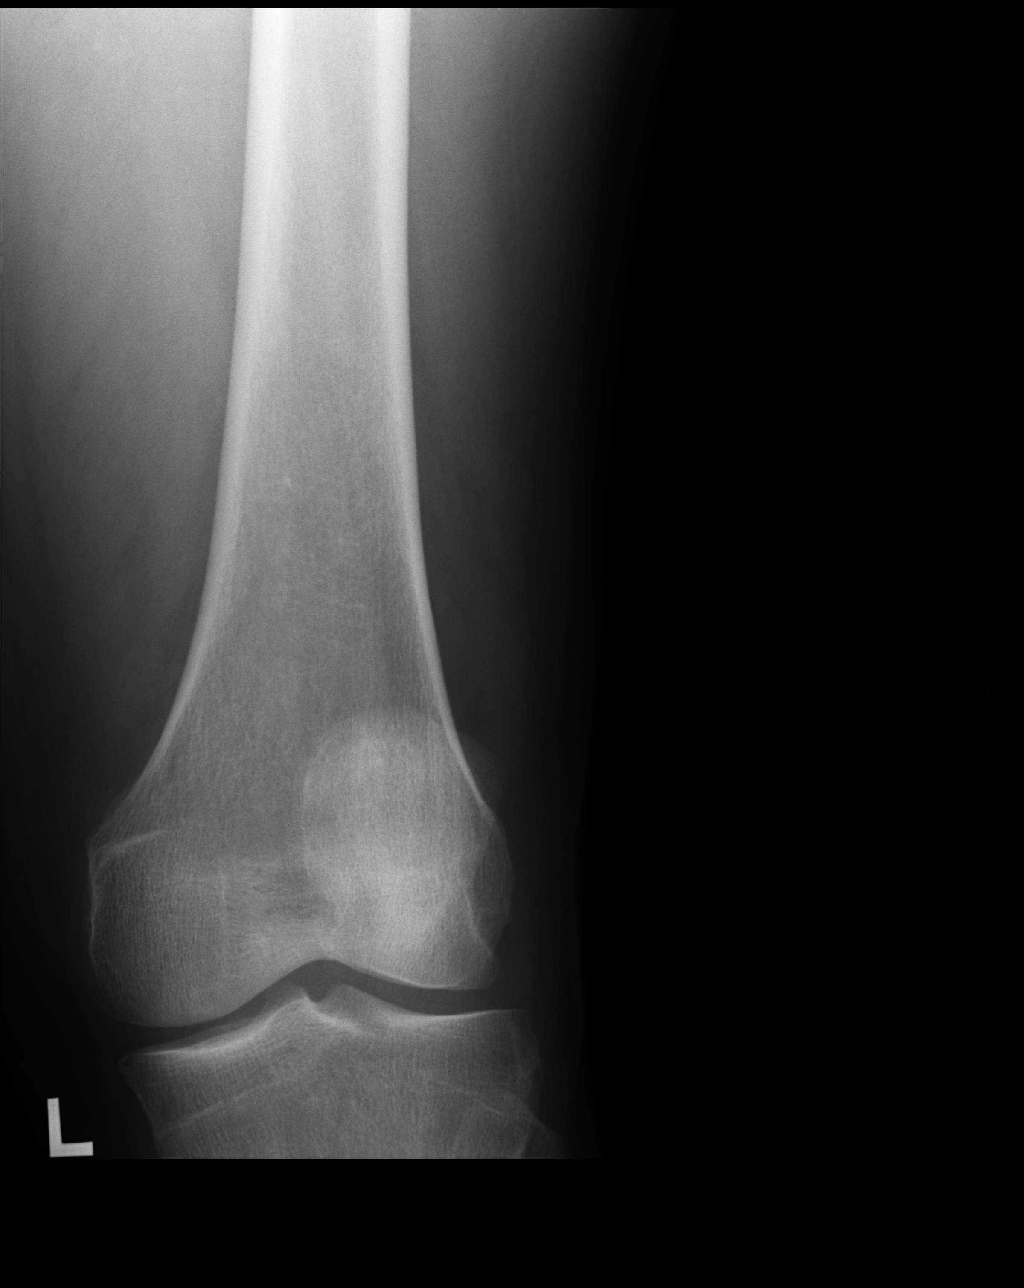

[lateral]
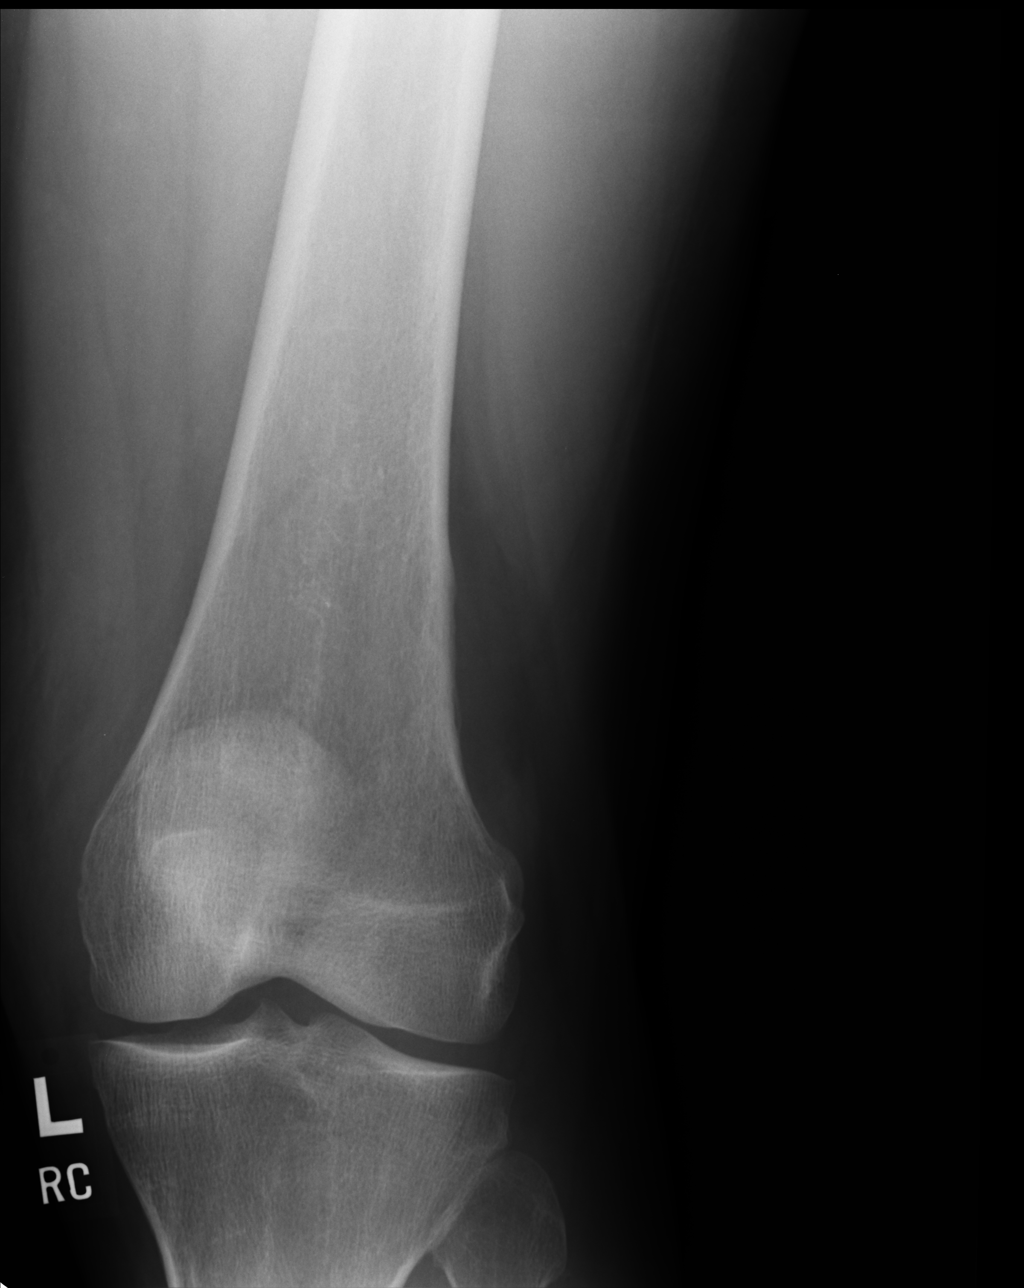

[ap ext rot]
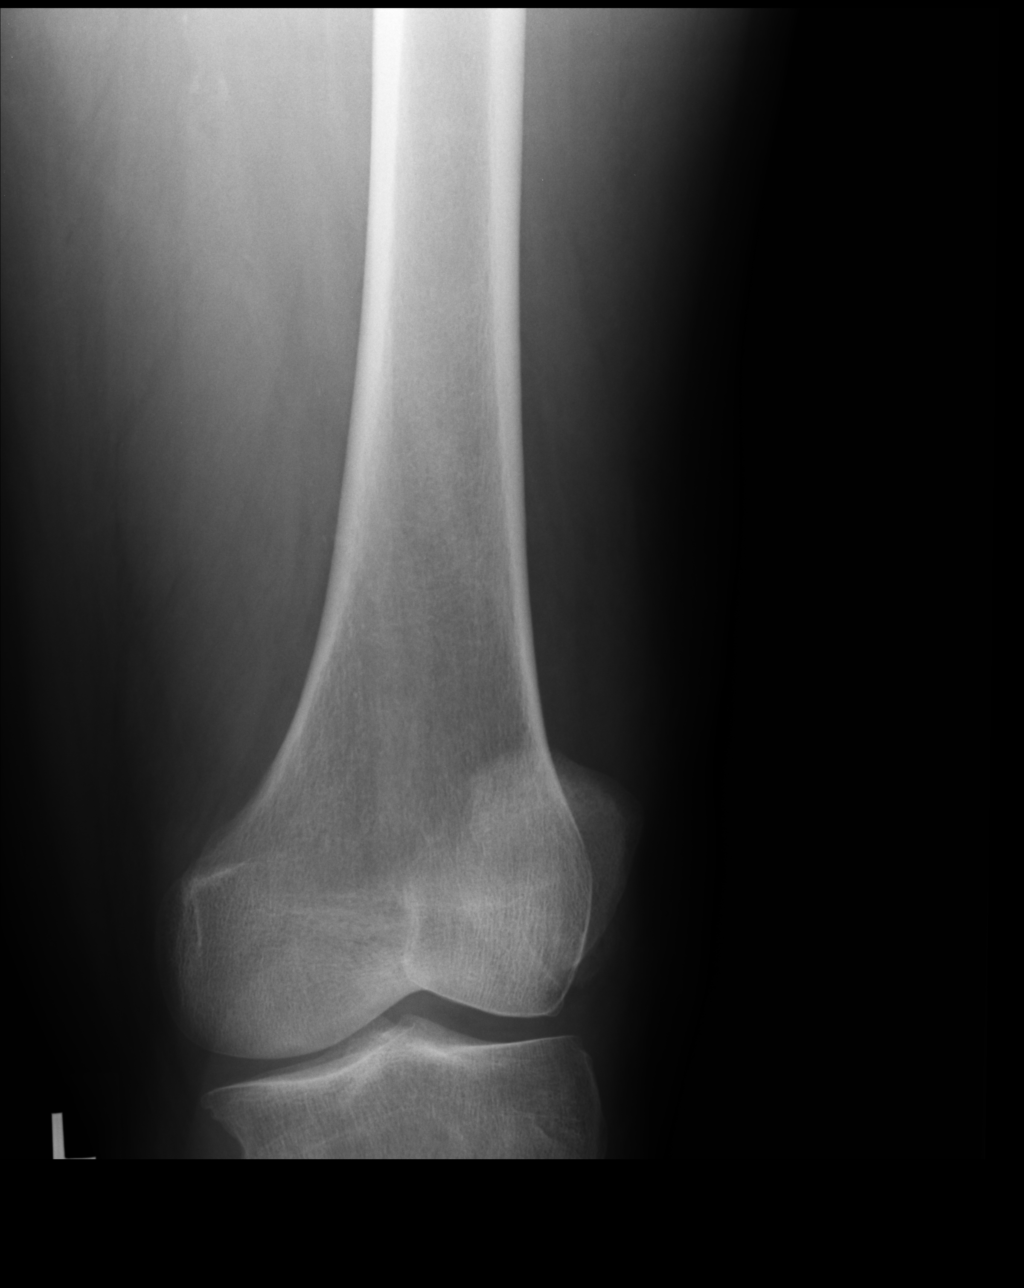

[ap int rot]
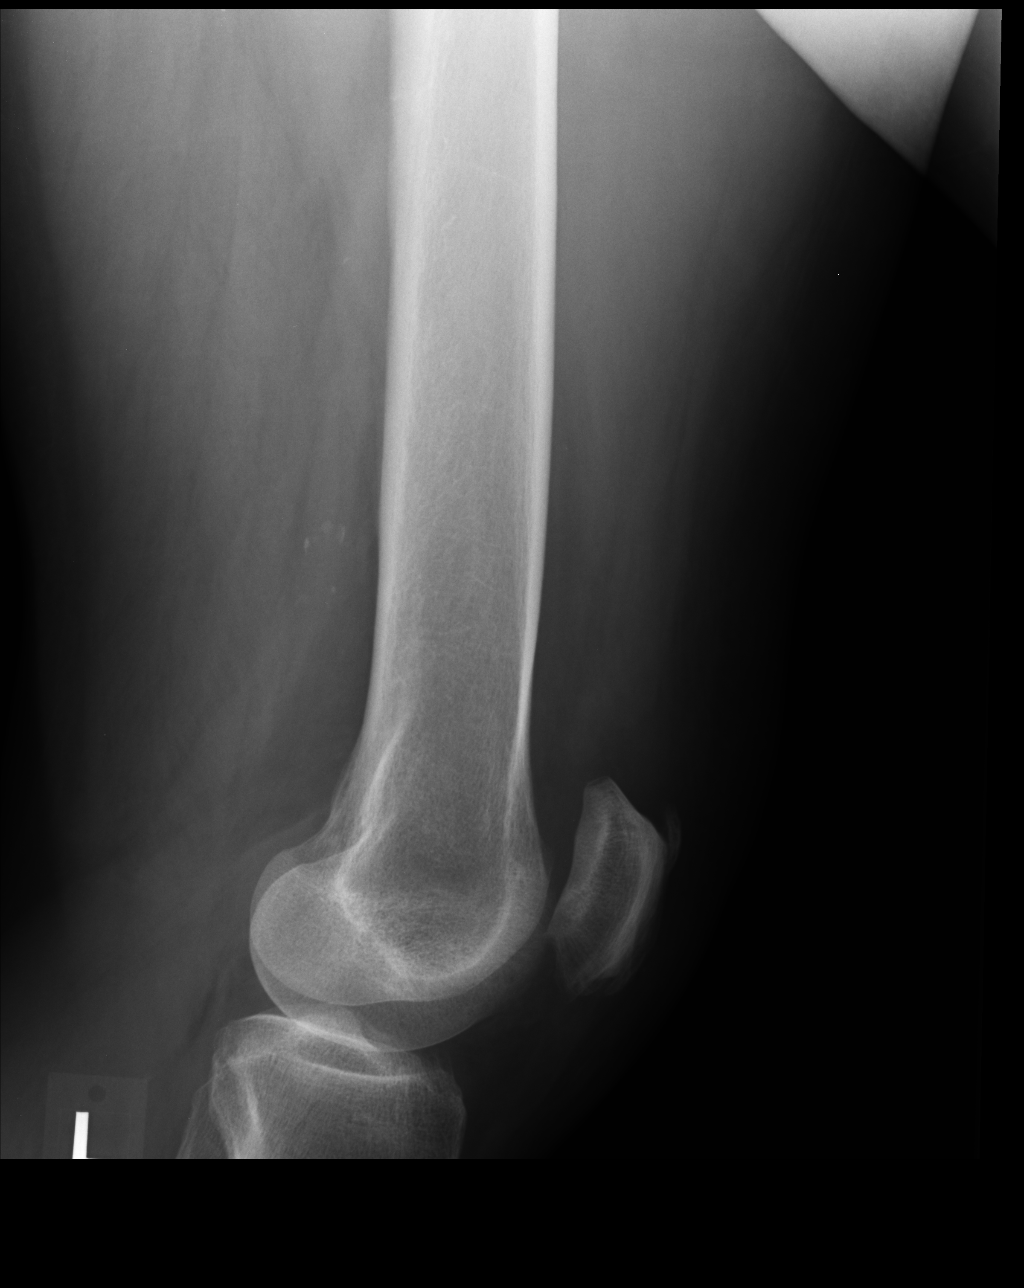

[4 of 4 positions shown; findings below may reference images not displayed]

FINDINGS: No definite joint effusion is seen. Alignment is normal.
Joint spaces are preserved.  No fracture or dislocation is evident.
No soft tissue lesions are seen.  There is minimal spurring of the
posterior inferior aspect of the patella.  There is enthesophyte
spurring of the anterior superior aspect of the patella.
IMPRESSION: No fracture or dislocation is evident.  Minimal spurring.

## 2013-03-11 IMAGING — CR DG FEMUR 2V*L*
4 series · 4 of 4 positions shown · non-contrast
Comparison: Knee examination.

CLINICAL DATA: History given of injury from fall.

LEFT FEMUR - 2 VIEW

[AP (1 of 3)]
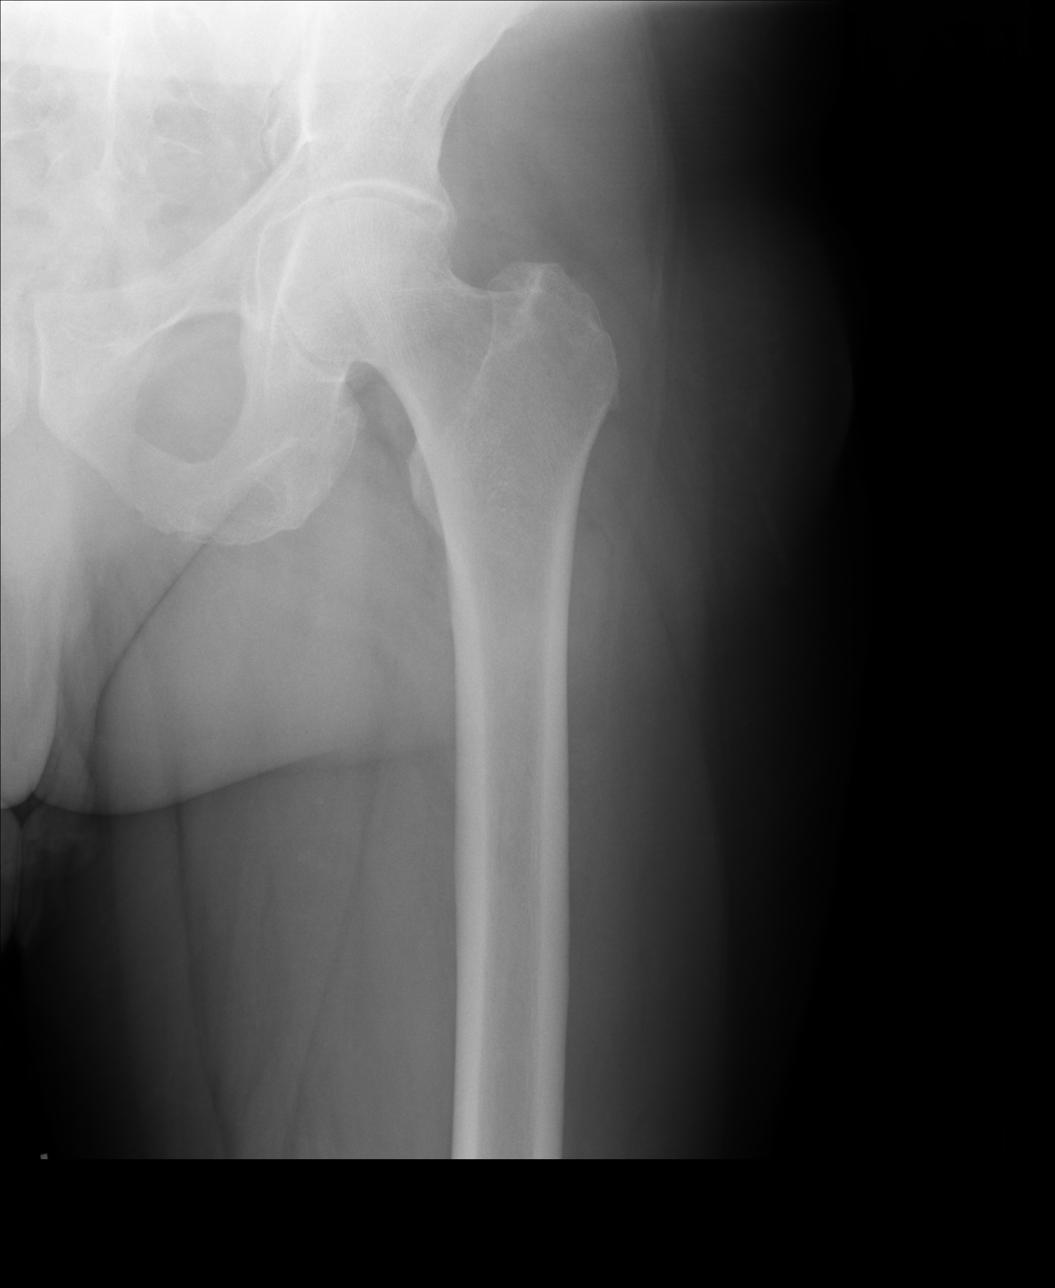

[lateral]
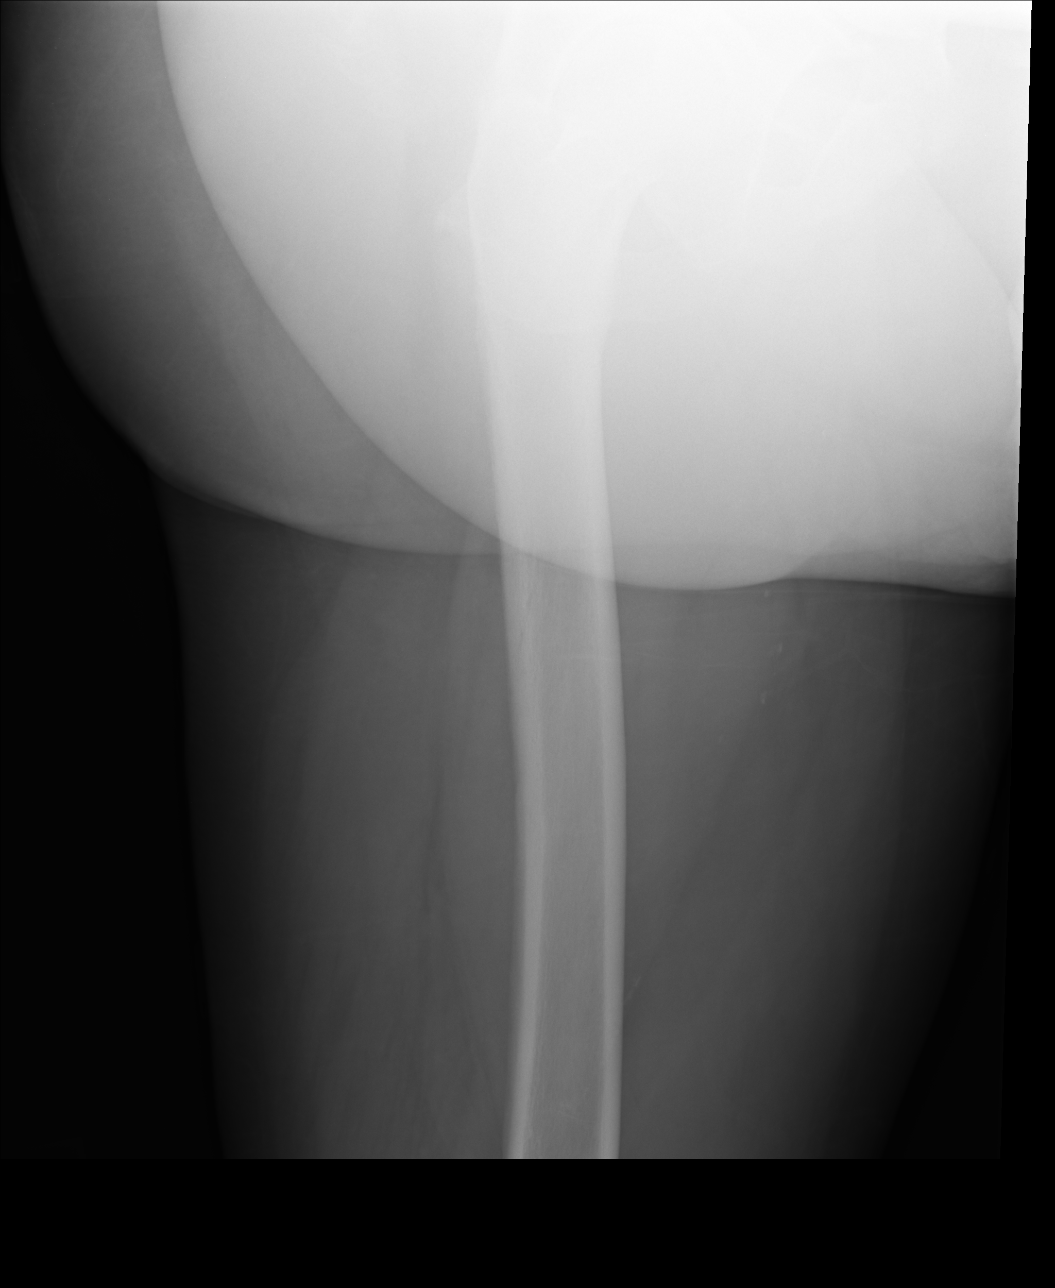

[AP (2 of 3)]
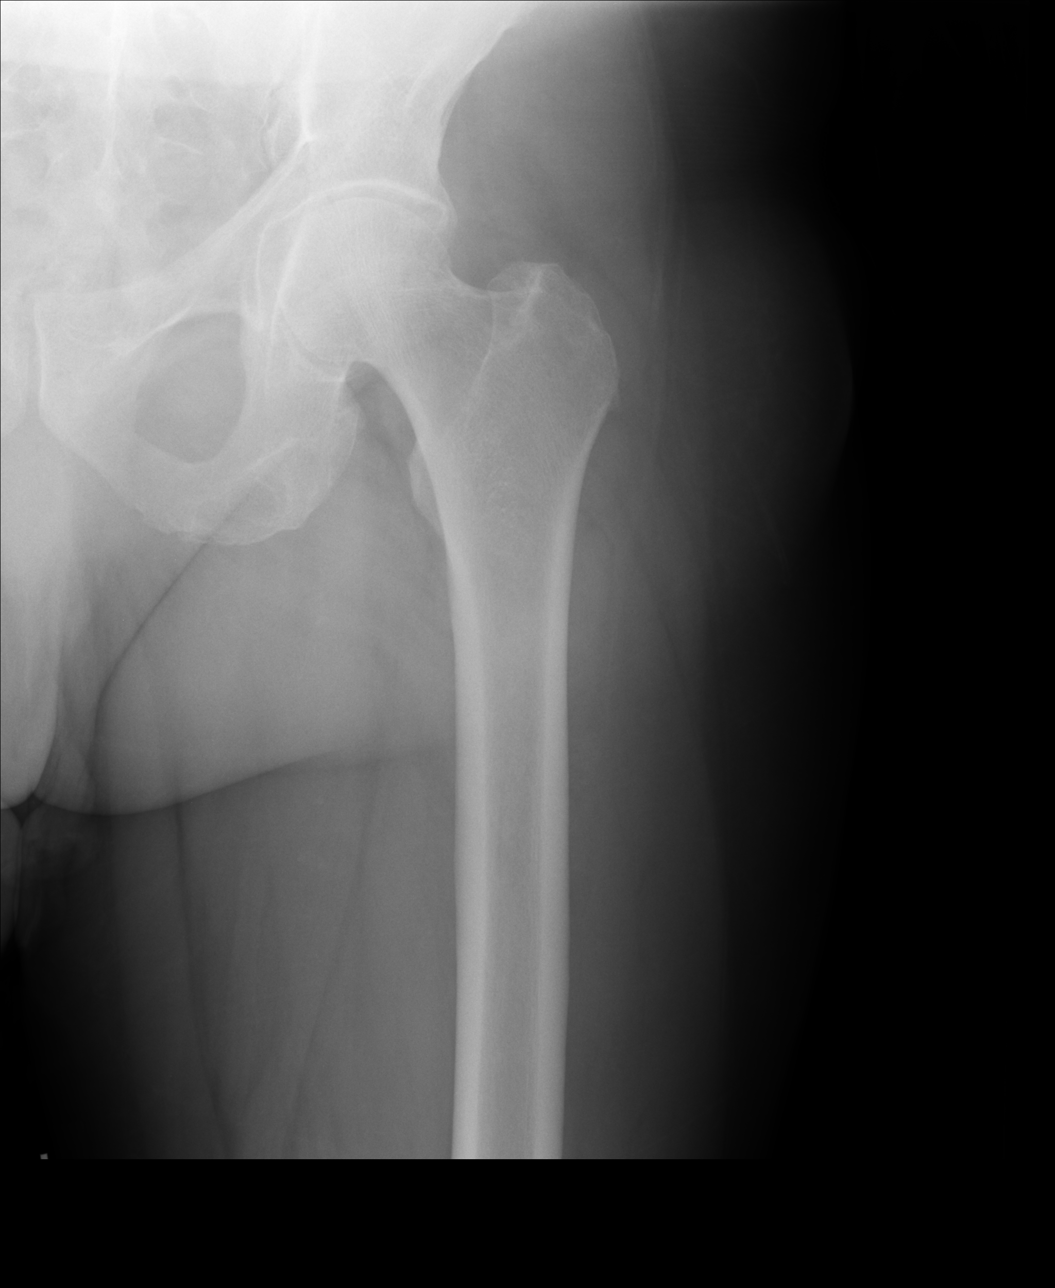

[AP (3 of 3)]
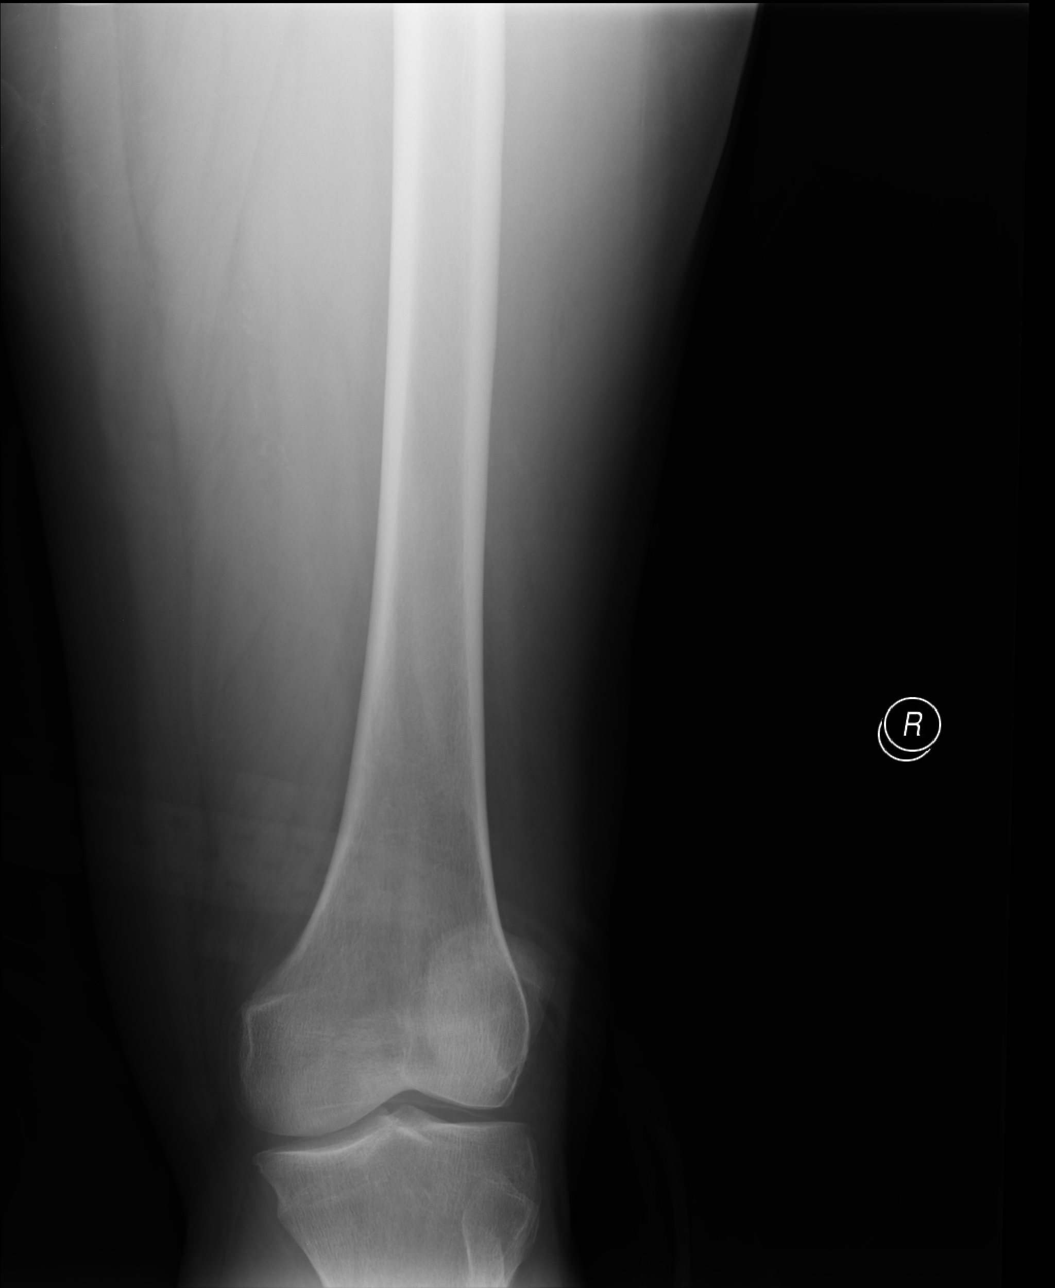

[4 of 4 positions shown; findings below may reference images not displayed]

FINDINGS: Alignment is normal.  Joint spaces are preserved.  No
fracture or dislocation is evident.  No soft tissue lesions are
seen.
IMPRESSION: No fracture or dislocation is evident.

## 2013-03-28 ENCOUNTER — Other Ambulatory Visit: Payer: Self-pay | Admitting: Endocrinology

## 2013-04-05 ENCOUNTER — Other Ambulatory Visit: Payer: Self-pay | Admitting: Endocrinology

## 2013-04-11 ENCOUNTER — Encounter: Payer: Self-pay | Admitting: Endocrinology

## 2013-04-11 ENCOUNTER — Ambulatory Visit (INDEPENDENT_AMBULATORY_CARE_PROVIDER_SITE_OTHER): Payer: BC Managed Care – PPO | Admitting: Endocrinology

## 2013-04-11 ENCOUNTER — Telehealth: Payer: Self-pay

## 2013-04-11 VITALS — BP 134/70 | HR 78 | Ht 71.0 in | Wt 229.0 lb

## 2013-04-11 DIAGNOSIS — R809 Proteinuria, unspecified: Secondary | ICD-10-CM

## 2013-04-11 DIAGNOSIS — Z125 Encounter for screening for malignant neoplasm of prostate: Secondary | ICD-10-CM

## 2013-04-11 DIAGNOSIS — Z79899 Other long term (current) drug therapy: Secondary | ICD-10-CM

## 2013-04-11 DIAGNOSIS — M25562 Pain in left knee: Secondary | ICD-10-CM

## 2013-04-11 DIAGNOSIS — E7800 Pure hypercholesterolemia, unspecified: Secondary | ICD-10-CM

## 2013-04-11 DIAGNOSIS — I10 Essential (primary) hypertension: Secondary | ICD-10-CM

## 2013-04-11 DIAGNOSIS — E1129 Type 2 diabetes mellitus with other diabetic kidney complication: Secondary | ICD-10-CM

## 2013-04-11 DIAGNOSIS — M25569 Pain in unspecified knee: Secondary | ICD-10-CM

## 2013-04-11 LAB — CBC WITH DIFFERENTIAL/PLATELET
Basophils Absolute: 0 10*3/uL (ref 0.0–0.1)
Eosinophils Relative: 1.1 % (ref 0.0–5.0)
HCT: 44.6 % (ref 39.0–52.0)
Hemoglobin: 14.6 g/dL (ref 13.0–17.0)
Lymphs Abs: 3.2 10*3/uL (ref 0.7–4.0)
MCV: 80.2 fl (ref 78.0–100.0)
Monocytes Absolute: 0.8 10*3/uL (ref 0.1–1.0)
Neutro Abs: 4.2 10*3/uL (ref 1.4–7.7)
Platelets: 195 10*3/uL (ref 150.0–400.0)
RDW: 15.8 % — ABNORMAL HIGH (ref 11.5–14.6)

## 2013-04-11 LAB — BASIC METABOLIC PANEL
BUN: 17 mg/dL (ref 6–23)
CO2: 30 mEq/L (ref 19–32)
Calcium: 9.2 mg/dL (ref 8.4–10.5)
Chloride: 104 mEq/L (ref 96–112)
Creatinine, Ser: 1.2 mg/dL (ref 0.4–1.5)
Glucose, Bld: 88 mg/dL (ref 70–99)

## 2013-04-11 LAB — HEPATIC FUNCTION PANEL
AST: 18 U/L (ref 0–37)
Alkaline Phosphatase: 72 U/L (ref 39–117)
Total Bilirubin: 0.8 mg/dL (ref 0.3–1.2)

## 2013-04-11 LAB — TSH: TSH: 0.86 u[IU]/mL (ref 0.35–5.50)

## 2013-04-11 LAB — URINALYSIS, ROUTINE W REFLEX MICROSCOPIC
Bilirubin Urine: NEGATIVE
Ketones, ur: NEGATIVE
Nitrite: NEGATIVE
Total Protein, Urine: 30

## 2013-04-11 LAB — LIPID PANEL
Cholesterol: 113 mg/dL (ref 0–200)
LDL Cholesterol: 62 mg/dL (ref 0–99)
Total CHOL/HDL Ratio: 3
Triglycerides: 33 mg/dL (ref 0.0–149.0)
VLDL: 6.6 mg/dL (ref 0.0–40.0)

## 2013-04-11 LAB — MICROALBUMIN / CREATININE URINE RATIO: Microalb Creat Ratio: 12.2 mg/g (ref 0.0–30.0)

## 2013-04-11 LAB — PSA: PSA: 0.71 ng/mL (ref 0.10–4.00)

## 2013-04-11 MED ORDER — LOSARTAN POTASSIUM-HCTZ 100-25 MG PO TABS: 1.0000 | ORAL_TABLET | Freq: Every day | ORAL | Status: DC

## 2013-04-11 MED ORDER — METOPROLOL SUCCINATE ER 50 MG PO TB24: ORAL_TABLET | ORAL | Status: DC

## 2013-04-11 MED ORDER — HYDROCODONE-ACETAMINOPHEN 10-325 MG PO TABS: 1.0000 | ORAL_TABLET | Freq: Four times a day (QID) | ORAL | Status: DC | PRN

## 2013-04-11 MED ORDER — AMLODIPINE BESYLATE 5 MG PO TABS: 5.0000 mg | ORAL_TABLET | Freq: Every day | ORAL | Status: DC

## 2013-04-11 NOTE — Patient Instructions (Addendum)
Here is a prescription for pain medication. Refer to a specialist.  you will receive a phone call, about a day and time for an appointment. Please come in soon for a regular physical.  blood tests are being requested for you today.  We'll contact you with results.

## 2013-04-11 NOTE — Telephone Encounter (Signed)
Pt advised rx'sfaxed

## 2013-04-11 NOTE — Telephone Encounter (Signed)
Please call pt [phone], pt says pharmacy did not receive e-script / Sherri S.

## 2013-04-11 NOTE — Progress Notes (Signed)
  Subjective:    Patient ID: Roberto Rivera, male    DOB: 05/05/1951, 62 y.o.   MRN: [id number]  HPI Pt states 1 month of moderate pain at the left knee, which started in the context of a misstep.  No assoc numbness.  He saw urgent care, where he was given a brace.   Past Medical History  Diagnosis Date  . Hypertension   . Diabetes mellitus   . Hypercholesteremia   . ALLERGIC RHINITIS 04/01/2007  . DIABETES MELLITUS, TYPE II 04/01/2007  . 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 HYPERCHOLESTEROLEMIA 02/07/2008  . HYPERTENSION 04/01/2007  . MICROCYTOSIS 02/07/2008  . OSTEOARTHRITIS, SPINE 02/07/2008  . THYROID CYST 02/07/2008    No past surgical history on file.  History   Social History  . Marital Status: Married    Spouse Name: N/A    Number of Children: N/A  . Years of Education: N/A   Occupational History  . [employer]   Social History Main Topics  . Smoking status: Current Every Day Smoker -- 1.00 packs/day    Types: Cigarettes  . Smokeless tobacco: Not on file  . Alcohol Use: No  . Drug Use: No  . Sexual Activity:    Other Topics Concern  . Not on file   Social History Narrative   Divorced x many years    Current Outpatient Prescriptions on File Prior to Visit  Medication Sig Dispense Refill  . glucose blood (ONE TOUCH ULTRA TEST) test strip Use as instructed once daily and lancets 250.00      . pioglitazone (ACTOS) 15 MG tablet TAKE ONE TABLET BY MOUTH ONE TIME DAILY  30 tablet  10  . simvastatin (ZOCOR) 40 MG tablet Take 1 tablet (40 mg total) by mouth at bedtime.  30 tablet  1  . tadalafil (CIALIS) 20 MG tablet Take 20 mg by mouth as needed.       No current facility-administered medications on file prior to visit.    No Known Allergies  Family History  Problem Relation Age of Onset  . Cancer Mother     had uncertain type of cancer    BP 134/70  Pulse 78  Ht 5\' 11"  (1.803 m)  Wt 229 lb (103.874 kg)  BMI 31.95 kg/m2  SpO2 98%  Review of  Systems Denies LOC and weight change.     Objective:   Physical Exam VITAL SIGNS:  See vs page GENERAL: no distress Left knee: slight swelling, but no tend/warmth.  Full rom, but rom is painful.  Gait: favors left knee.   Lab Results  Component Value Date   WBC 8.3 04/11/2013   HGB 14.6 04/11/2013   HCT 44.6 04/11/2013   PLT 195.0 04/11/2013   GLUCOSE 88 04/11/2013   CHOL 113 04/11/2013   TRIG 33.0 04/11/2013   HDL 44.30 04/11/2013   LDLCALC 62 04/11/2013   ALT 16 04/11/2013   AST 18 04/11/2013   NA 139 04/11/2013   K 3.4* 04/11/2013   CL 104 04/11/2013   CREATININE 1.2 04/11/2013   BUN 17 04/11/2013   CO2 30 04/11/2013   TSH 0.86 04/11/2013   PSA 0.71 04/11/2013   HGBA1C 6.0 04/11/2013   MICROALBUR 16.8* 04/11/2013      Assessment & Plan:  Knee injury, new, uncertain type DM: well-controlled Dyslipidemia: well-controlled

## 2013-04-25 ENCOUNTER — Encounter: Payer: Self-pay | Admitting: Endocrinology

## 2013-04-25 ENCOUNTER — Ambulatory Visit (INDEPENDENT_AMBULATORY_CARE_PROVIDER_SITE_OTHER): Payer: BC Managed Care – PPO | Admitting: Endocrinology

## 2013-04-25 VITALS — BP 132/74 | HR 76 | Ht 70.0 in | Wt 231.0 lb

## 2013-04-25 DIAGNOSIS — R21 Rash and other nonspecific skin eruption: Secondary | ICD-10-CM

## 2013-04-25 DIAGNOSIS — Z0000 Encounter for general adult medical examination without abnormal findings: Secondary | ICD-10-CM

## 2013-04-25 MED ORDER — CLOTRIMAZOLE-BETAMETHASONE 1-0.05 % EX CREA: TOPICAL_CREAM | Freq: Two times a day (BID) | CUTANEOUS | Status: DC | PRN

## 2013-04-25 NOTE — Patient Instructions (Addendum)
i have sent a prescription to your pharmacy, for your feet.   please consider these measures for your health:  minimize alcohol.  do not use tobacco products.  have a colonoscopy at least every 10 years from age 62.  keep firearms safely stored.  always use seat belts.  have working smoke alarms in your home.  see an eye doctor and dentist regularly.  never drive under the influence of alcohol or drugs (including prescription drugs).   Please call if you decide to get the colonoscopy test done.  It reduces your chances of dying of cancer.   Please come back for a follow-up appointment in 6 months.

## 2013-04-25 NOTE — Progress Notes (Signed)
Subjective:    Patient ID: Roberto Rivera, male    DOB: February 18, 1951, 62 y.o.   MRN: [id number]  HPI Pt is here for regular wellness examination, and is feeling pretty well in general, and says chronic med probs are stable, except as noted below Past Medical History  Diagnosis Date  . Hypertension   . Diabetes mellitus   . Hypercholesteremia   . ALLERGIC RHINITIS 04/01/2007  . DIABETES MELLITUS, TYPE II 04/01/2007  . 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 HYPERCHOLESTEROLEMIA 02/07/2008  . HYPERTENSION 04/01/2007  . MICROCYTOSIS 02/07/2008  . OSTEOARTHRITIS, SPINE 02/07/2008  . THYROID CYST 02/07/2008    No past surgical history on file.  History   Social History  . Marital Status: Married    Spouse Name: N/A    Number of Children: N/A  . Years of Education: N/A   Occupational History  . [employer]   Social History Main Topics  . Smoking status: Current Every Day Smoker -- 1.00 packs/day    Types: Cigarettes  . Smokeless tobacco: Not on file  . Alcohol Use: No  . Drug Use: No  . Sexual Activity:    Other Topics Concern  . Not on file   Social History Narrative   Divorced x many years    Current Outpatient Prescriptions on File Prior to Visit  Medication Sig Dispense Refill  . amLODipine (NORVASC) 5 MG tablet Take 1 tablet (5 mg total) by mouth daily.  30 tablet  10  . glucose blood (ONE TOUCH ULTRA TEST) test strip Use as instructed once daily and lancets 250.00      . HYDROcodone-acetaminophen (NORCO) 10-325 MG per tablet Take 1 tablet by mouth every 6 (six) hours as needed for pain.  30 tablet  0  . losartan-hydrochlorothiazide (HYZAAR) 100-25 MG per tablet Take 1 tablet by mouth daily.  30 tablet  0  . metoprolol succinate (TOPROL-XL) 50 MG 24 hr tablet Take 1/2 tablet by mouth once daily  30 tablet  1  . pioglitazone (ACTOS) 15 MG tablet TAKE ONE TABLET BY MOUTH ONE TIME DAILY  30 tablet  10  . simvastatin (ZOCOR) 40 MG tablet Take 1 tablet (40 mg total)  by mouth at bedtime.  30 tablet  1  . tadalafil (CIALIS) 20 MG tablet Take 20 mg by mouth as needed.       No current facility-administered medications on file prior to visit.   No Known Allergies  Family History  Problem Relation Age of Onset  . Cancer Mother     had uncertain type of cancer    BP 132/74  Pulse 76  Ht 5\' 10"  (1.778 m)  Wt 231 lb (104.781 kg)  BMI 33.15 kg/m2  SpO2 97%  Review of Systems  Constitutional: Negative for fever.  HENT: Negative for hearing loss.   Eyes: Negative for visual disturbance.  Respiratory: Negative for shortness of breath.   Cardiovascular: Negative for chest pain.  Gastrointestinal: Negative for anal bleeding.  Endocrine: Negative for cold intolerance.  Genitourinary: Negative for hematuria.  Musculoskeletal: Negative for back pain.  Skin: Negative for rash.  Allergic/Immunologic: Negative for environmental allergies.  Neurological: Negative for syncope.  Hematological: Does not bruise/bleed easily.  Psychiatric/Behavioral: Negative for dysphoric mood.       Objective:   Physical Exam VS: see vs page GEN: no distress HEAD: head: no deformity eyes: no periorbital swelling, no proptosis external nose and ears are normal mouth: no lesion seen NECK: supple, thyroid is  not enlarged CHEST WALL: no deformity LUNGS: clear to auscultation BREASTS:  No gynecomastia CV: reg rate and rhythm, no murmur ABD: abdomen is soft, nontender.  no hepatosplenomegaly.  not distended.  no hernia. RECTAL: normal external and internal exam.  heme neg. PROSTATE:  Normal size.  No nodule MUSCULOSKELETAL: muscle bulk and strength are grossly normal.  no obvious joint swelling.  gait is normal and steady PULSES:  no carotid bruit NEURO:  cn 2-12 grossly intact.   readily moves all 4's.   SKIN:  Normal texture and temperature.  No rash or suspicious lesion is visible.   NODES:  None palpable at the neck PSYCH: alert, oriented x3.  Does not appear  anxious nor depressed.        Assessment & Plan:  Wellness visit today, with problems stable, except as noted. we discussed code status.  pt requests full code, but would not want to be started or maintained on artificial life-support measures if there was not a reasonable chance of recovery    SEPARATE EVALUATION FOLLOWS--EACH PROBLEM HERE IS NEW, NOT RESPONDING TO TREATMENT, OR POSES SIGNIFICANT RISK TO THE PATIENT'S HEALTH: HISTORY OF THE PRESENT ILLNESS: Pt reports many years of slight dryness of the feet, and assoc peeling.   PAST MEDICAL HISTORY reviewed and up to date today.   REVIEW OF SYSTEMS: Denies numbness PHYSICAL EXAMINATION: VITAL SIGNS:  See vs page.   GENERAL: no distress. Skin: mild flaking on the feet. IMPRESSION: Dermatitis, new, uncertain etiology PLAN: See instruction page

## 2013-04-26 DIAGNOSIS — Z0000 Encounter for general adult medical examination without abnormal findings: Secondary | ICD-10-CM | POA: Insufficient documentation

## 2013-04-26 DIAGNOSIS — Z0001 Encounter for general adult medical examination with abnormal findings: Secondary | ICD-10-CM | POA: Insufficient documentation

## 2013-04-26 DIAGNOSIS — R21 Rash and other nonspecific skin eruption: Secondary | ICD-10-CM | POA: Insufficient documentation

## 2013-06-11 ENCOUNTER — Other Ambulatory Visit: Payer: Self-pay | Admitting: Endocrinology

## 2013-06-25 ENCOUNTER — Other Ambulatory Visit: Payer: Self-pay | Admitting: Endocrinology

## 2013-07-16 ENCOUNTER — Other Ambulatory Visit: Payer: Self-pay | Admitting: Endocrinology

## 2013-07-30 ENCOUNTER — Other Ambulatory Visit: Payer: Self-pay | Admitting: Endocrinology

## 2013-08-16 ENCOUNTER — Other Ambulatory Visit: Payer: Self-pay | Admitting: Endocrinology

## 2013-08-28 ENCOUNTER — Other Ambulatory Visit: Payer: Self-pay | Admitting: Endocrinology

## 2013-09-17 ENCOUNTER — Other Ambulatory Visit: Payer: Self-pay | Admitting: Endocrinology

## 2013-10-04 ENCOUNTER — Other Ambulatory Visit: Payer: Self-pay | Admitting: Endocrinology

## 2013-10-08 ENCOUNTER — Other Ambulatory Visit: Payer: Self-pay

## 2013-10-08 MED ORDER — SIMVASTATIN 40 MG PO TABS: ORAL_TABLET | ORAL | Status: DC

## 2013-10-15 ENCOUNTER — Other Ambulatory Visit: Payer: Self-pay | Admitting: Endocrinology

## 2013-10-25 ENCOUNTER — Other Ambulatory Visit: Payer: Self-pay | Admitting: Endocrinology

## 2013-11-18 ENCOUNTER — Other Ambulatory Visit: Payer: Self-pay | Admitting: Endocrinology

## 2013-11-23 ENCOUNTER — Other Ambulatory Visit: Payer: Self-pay | Admitting: Endocrinology

## 2013-12-19 ENCOUNTER — Other Ambulatory Visit: Payer: Self-pay | Admitting: Endocrinology

## 2014-01-22 ENCOUNTER — Other Ambulatory Visit: Payer: Self-pay | Admitting: Endocrinology

## 2014-02-18 ENCOUNTER — Other Ambulatory Visit: Payer: Self-pay | Admitting: Endocrinology

## 2014-03-21 ENCOUNTER — Other Ambulatory Visit: Payer: Self-pay | Admitting: Endocrinology

## 2014-04-21 ENCOUNTER — Other Ambulatory Visit: Payer: Self-pay | Admitting: Endocrinology

## 2014-04-28 ENCOUNTER — Other Ambulatory Visit: Payer: Self-pay | Admitting: Endocrinology

## 2014-05-19 ENCOUNTER — Other Ambulatory Visit: Payer: Self-pay | Admitting: Endocrinology

## 2014-05-27 ENCOUNTER — Other Ambulatory Visit: Payer: Self-pay | Admitting: Endocrinology

## 2014-06-18 ENCOUNTER — Other Ambulatory Visit: Payer: Self-pay | Admitting: Endocrinology

## 2014-06-24 ENCOUNTER — Other Ambulatory Visit: Payer: Self-pay | Admitting: Endocrinology

## 2014-06-25 ENCOUNTER — Other Ambulatory Visit: Payer: Self-pay

## 2014-06-25 MED ORDER — AMLODIPINE BESYLATE 5 MG PO TABS: ORAL_TABLET | ORAL | Status: DC

## 2014-06-25 MED ORDER — SIMVASTATIN 40 MG PO TABS: ORAL_TABLET | ORAL | Status: DC

## 2014-07-03 ENCOUNTER — Ambulatory Visit (INDEPENDENT_AMBULATORY_CARE_PROVIDER_SITE_OTHER): Payer: BC Managed Care – PPO | Admitting: Endocrinology

## 2014-07-03 ENCOUNTER — Encounter: Payer: Self-pay | Admitting: Endocrinology

## 2014-07-03 VITALS — BP 138/88 | HR 95 | Temp 98.7°F | Ht 70.0 in | Wt 249.0 lb

## 2014-07-03 DIAGNOSIS — E7800 Pure hypercholesterolemia, unspecified: Secondary | ICD-10-CM

## 2014-07-03 DIAGNOSIS — R21 Rash and other nonspecific skin eruption: Secondary | ICD-10-CM

## 2014-07-03 DIAGNOSIS — I10 Essential (primary) hypertension: Secondary | ICD-10-CM

## 2014-07-03 DIAGNOSIS — Z125 Encounter for screening for malignant neoplasm of prostate: Secondary | ICD-10-CM

## 2014-07-03 DIAGNOSIS — E1121 Type 2 diabetes mellitus with diabetic nephropathy: Secondary | ICD-10-CM

## 2014-07-03 DIAGNOSIS — Z0000 Encounter for general adult medical examination without abnormal findings: Secondary | ICD-10-CM

## 2014-07-03 LAB — LIPID PANEL
Cholesterol: 113 mg/dL (ref 0–200)
HDL: 35.7 mg/dL — ABNORMAL LOW (ref >39.00)
LDL CALC: 68 mg/dL (ref 0–99)
NONHDL: 77.3
Total CHOL/HDL Ratio: 3
Triglycerides: 46 mg/dL (ref 0.0–149.0)
VLDL: 9.2 mg/dL (ref 0.0–40.0)

## 2014-07-03 LAB — CBC WITH DIFFERENTIAL/PLATELET
BASOS ABS: 0 10*3/uL (ref 0.0–0.1)
Basophils Relative: 0.4 % (ref 0.0–3.0)
Eosinophils Absolute: 0.1 10*3/uL (ref 0.0–0.7)
Eosinophils Relative: 1.1 % (ref 0.0–5.0)
HEMATOCRIT: 48 % (ref 39.0–52.0)
HEMOGLOBIN: 15.4 g/dL (ref 13.0–17.0)
LYMPHS ABS: 3.1 10*3/uL (ref 0.7–4.0)
Lymphocytes Relative: 32.8 % (ref 12.0–46.0)
MCHC: 32.1 g/dL (ref 30.0–36.0)
MCV: 81.3 fl (ref 78.0–100.0)
MONO ABS: 0.8 10*3/uL (ref 0.1–1.0)
Monocytes Relative: 8.7 % (ref 3.0–12.0)
NEUTROS ABS: 5.4 10*3/uL (ref 1.4–7.7)
Neutrophils Relative %: 57 % (ref 43.0–77.0)
PLATELETS: 212 10*3/uL (ref 150.0–400.0)
RBC: 5.91 Mil/uL — ABNORMAL HIGH (ref 4.22–5.81)
RDW: 15.5 % (ref 11.5–15.5)
WBC: 9.4 10*3/uL (ref 4.0–10.5)

## 2014-07-03 LAB — URINALYSIS, ROUTINE W REFLEX MICROSCOPIC
BILIRUBIN URINE: NEGATIVE
KETONES UR: NEGATIVE
LEUKOCYTES UA: NEGATIVE
Nitrite: NEGATIVE
RBC / HPF: NONE SEEN (ref 0–?)
Specific Gravity, Urine: 1.025 (ref 1.000–1.030)
Urine Glucose: NEGATIVE
Urobilinogen, UA: 0.2 (ref 0.0–1.0)
pH: 5.5 (ref 5.0–8.0)

## 2014-07-03 LAB — HEPATIC FUNCTION PANEL
ALT: 19 U/L (ref 0–53)
AST: 20 U/L (ref 0–37)
Albumin: 3.5 g/dL (ref 3.5–5.2)
Alkaline Phosphatase: 85 U/L (ref 39–117)
BILIRUBIN DIRECT: 0.1 mg/dL (ref 0.0–0.3)
Total Bilirubin: 0.7 mg/dL (ref 0.2–1.2)
Total Protein: 6.9 g/dL (ref 6.0–8.3)

## 2014-07-03 LAB — MICROALBUMIN / CREATININE URINE RATIO
Creatinine,U: 117.3 mg/dL
MICROALB/CREAT RATIO: 14.4 mg/g (ref 0.0–30.0)
Microalb, Ur: 16.9 mg/dL — ABNORMAL HIGH (ref 0.0–1.9)

## 2014-07-03 LAB — BASIC METABOLIC PANEL
BUN: 23 mg/dL (ref 6–23)
CO2: 22 meq/L (ref 19–32)
Calcium: 9.6 mg/dL (ref 8.4–10.5)
Chloride: 102 mEq/L (ref 96–112)
Creatinine, Ser: 1.3 mg/dL (ref 0.4–1.5)
GFR: 69.9 mL/min (ref >60.00)
GLUCOSE: 86 mg/dL (ref 70–99)
POTASSIUM: 3.5 meq/L (ref 3.5–5.1)
SODIUM: 138 meq/L (ref 135–145)

## 2014-07-03 LAB — TSH: TSH: 0.76 u[IU]/mL (ref 0.35–4.50)

## 2014-07-03 LAB — PSA: PSA: 0.95 ng/mL (ref 0.10–4.00)

## 2014-07-03 LAB — HEMOGLOBIN A1C: Hgb A1c MFr Bld: 6.2 % (ref 4.6–6.5)

## 2014-07-03 NOTE — Patient Instructions (Addendum)
please consider these measures for your health:  minimize alcohol.  do not use tobacco products.  have a colonoscopy at least every 10 years from age 63.  keep firearms safely stored.  always use seat belts.  have working smoke alarms in your home.  see an eye doctor and dentist regularly.  never drive under the influence of alcohol or drugs (including prescription drugs).   it is critically important to prevent falling down (keep floor areas well-lit, dry, and free of loose objects.  If you have a cane, walker, or wheelchair, you should use it, even for short trips around the house.  Also, try not to rush). blood tests are being requested for you today.  We'll let you know about the results. Please see a dermatology specialist.  you will receive a phone call, about a day and time for an appointment.   Please come back for a follow-up appointment in 6 months. Because of your leg swelling, please stop taking the pioglitizone.  If necessary, i'll prescribe for you instead a generic medication called "metformin."

## 2014-07-03 NOTE — Progress Notes (Signed)
Subjective:    Patient ID: Roberto Rivera, male    DOB: 03-Apr-1951, 63 y.o.   MRN: [id number]  HPI Pt is here for regular wellness examination, and is feeling pretty well in general, and says chronic med probs are stable, except as noted below Past Medical History  Diagnosis Date  . Hypertension   . Diabetes mellitus   . Hypercholesteremia   . ALLERGIC RHINITIS 04/01/2007  . DIABETES MELLITUS, TYPE II 04/01/2007  . 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 HYPERCHOLESTEROLEMIA 02/07/2008  . HYPERTENSION 04/01/2007  . MICROCYTOSIS 02/07/2008  . OSTEOARTHRITIS, SPINE 02/07/2008  . THYROID CYST 02/07/2008    No past surgical history on file.  History   Social History  . Marital Status: Married    Spouse Name: N/A    Number of Children: N/A  . Years of Education: N/A   Occupational History  . [employer] [place]   Social History Main Topics  . Smoking status: Current Every Day Smoker -- 1.00 packs/day    Types: Cigarettes  . Smokeless tobacco: Not on file  . Alcohol Use: No  . Drug Use: No  . Sexual Activity: Not on file   Other Topics Concern  . Not on file   Social History Narrative   Divorced x many years    Current Outpatient Prescriptions on File Prior to Visit  Medication Sig Dispense Refill  . amLODipine (NORVASC) 5 MG tablet TAKE 1 TABLET BY MOUTH EVERY DAY 30 tablet 0  . glucose blood (ONE TOUCH ULTRA TEST) test strip Use as instructed once daily and lancets 250.00    . losartan-hydrochlorothiazide (HYZAAR) 100-25 MG per tablet TAKE 1 TABLET BY MOUTH EVERY DAY. PATIENT NEEDS TO MAKE AN DOCTOR APPOINTMENT 30 tablet 0  . metoprolol succinate (TOPROL-XL) 50 MG 24 hr tablet TAKE 1/2 TABLET BY MOUTH DAILY 30 tablet 0  . simvastatin (ZOCOR) 40 MG tablet TAKE 1 TABLET BY MOUTH EVERY NIGHT AT BEDTIME 30 tablet 0  . tadalafil (CIALIS) 20 MG tablet Take 20 mg by mouth as needed.     No current facility-administered medications on file prior to visit.    No Known  Allergies  Family History  Problem Relation Age of Onset  . Cancer Mother     had uncertain type of cancer    BP 138/88 mmHg  Pulse 95  Temp(Src) 98.7 F (37.1 C) (Oral)  Ht 5\' 10"  (1.778 m)  Wt 249 lb (112.946 kg)  BMI 35.73 kg/m2  SpO2 97%   Review of Systems  Constitutional: Negative for fever.  HENT: Negative for hearing loss.   Eyes: Negative for visual disturbance.  Respiratory: Negative for shortness of breath.   Cardiovascular: Negative for chest pain.  Gastrointestinal: Negative for anal bleeding.  Endocrine: Negative for cold intolerance.  Genitourinary: Negative for hematuria and difficulty urinating.  Musculoskeletal: Negative for gait problem.  Skin: Negative for rash.  Allergic/Immunologic: Negative for environmental allergies.  Neurological: Negative for syncope and numbness.  Hematological: Does not bruise/bleed easily.  Psychiatric/Behavioral: Negative for dysphoric mood.       Objective:   Physical Exam VS: see vs page GEN: no distress HEAD: head: no deformity eyes: no periorbital swelling, no proptosis external nose and ears are normal mouth: no lesion seen NECK: supple, thyroid is not enlarged CHEST WALL: no deformity LUNGS: clear to auscultation BREASTS:  No gynecomastia CV: reg rate and rhythm, no murmur ABD: abdomen is soft, nontender.  no hepatosplenomegaly.  not distended.  no hernia RECTAL:  normal external and internal exam.  heme neg. PROSTATE:  Normal size.  No nodule MUSCULOSKELETAL: muscle bulk and strength are grossly normal.  no obvious joint swelling.  gait is normal and steady EXTEMITIES: no deformity.  no ulcer on the feet.  feet are of normal color and temp.  1+ bilat leg edema.  There is bilateral onychomycosis and calluses.   PULSES: dorsalis pedis intact bilat.  no carotid bruit NEURO:  cn 2-12 grossly intact.   readily moves all 4's.  sensation is intact to touch on the feet SKIN:  Normal texture and temperature.  No rash  or suspicious lesion is visible.  Moderate eczematous rash on the feet.  NODES:  None palpable at the neck PSYCH: alert, well-oriented.  Does not appear anxious nor depressed.       Assessment & Plan:  Wellness visit today, with problems stable, except as noted. please consider these measures for your health:  minimize alcohol.  do not use tobacco products.  have a colonoscopy at least every 10 years from age 63.  keep firearms safely stored.  always use seat belts.  have working smoke alarms in your home.  see an eye doctor and dentist regularly.  never drive under the influence of alcohol or drugs (including prescription drugs).      SEPARATE EVALUATION FOLLOWS--EACH PROBLEM HERE IS NEW, NOT RESPONDING TO TREATMENT, OR POSES SIGNIFICANT RISK TO THE PATIENT'S HEALTH: HISTORY OF THE PRESENT ILLNESS: Pt states few years of moderate pain at the left middle fingertip, and assoc skin breakdown.  It did not respond to liq N2 He declines weight-loss surgery. PAST MEDICAL HISTORY reviewed and up to date today REVIEW OF SYSTEMS: Denies weight change PHYSICAL EXAMINATION: VITAL SIGNS:  See vs page GENERAL: no distress. Left middle fingertip: approx 4 mm area is severely keratotic. IMPRESSION: Skin disorder, persistent, uncertain etiology Obesity, persistent PLAN:  Ref derm Pt is advised to redouble weight-loss efforts.

## 2014-07-03 NOTE — Progress Notes (Signed)
we discussed code status.  pt requests full code, but would not want to be started or maintained on artificial life-support measures if there was not a reasonable chance of recovery 

## 2014-07-19 ENCOUNTER — Other Ambulatory Visit: Payer: Self-pay | Admitting: Endocrinology

## 2014-07-30 ENCOUNTER — Other Ambulatory Visit: Payer: Self-pay | Admitting: Endocrinology

## 2014-08-07 ENCOUNTER — Other Ambulatory Visit: Payer: Self-pay | Admitting: Endocrinology

## 2014-08-20 ENCOUNTER — Other Ambulatory Visit: Payer: Self-pay | Admitting: Endocrinology

## 2014-08-27 ENCOUNTER — Other Ambulatory Visit: Payer: Self-pay | Admitting: Endocrinology

## 2014-09-07 ENCOUNTER — Other Ambulatory Visit: Payer: Self-pay | Admitting: Endocrinology

## 2014-09-27 ENCOUNTER — Other Ambulatory Visit: Payer: Self-pay | Admitting: Endocrinology

## 2014-10-13 ENCOUNTER — Telehealth: Payer: Self-pay | Admitting: Endocrinology

## 2014-10-13 MED ORDER — SIMVASTATIN 40 MG PO TABS: 40.0000 mg | ORAL_TABLET | Freq: Every day | ORAL | Status: DC

## 2014-10-13 NOTE — Telephone Encounter (Signed)
Rx sent to pharmacy per request.  

## 2014-10-13 NOTE — Telephone Encounter (Signed)
Pt needs refill for symvastatin [facility] [address]

## 2014-11-02 ENCOUNTER — Other Ambulatory Visit: Payer: Self-pay | Admitting: Endocrinology

## 2014-12-09 ENCOUNTER — Encounter: Payer: Self-pay | Admitting: Endocrinology

## 2015-01-01 ENCOUNTER — Ambulatory Visit: Payer: BC Managed Care – PPO | Admitting: Endocrinology

## 2015-01-21 ENCOUNTER — Other Ambulatory Visit: Payer: Self-pay | Admitting: Endocrinology

## 2015-02-15 ENCOUNTER — Ambulatory Visit: Payer: Self-pay | Admitting: Endocrinology

## 2015-02-20 ENCOUNTER — Other Ambulatory Visit: Payer: Self-pay | Admitting: Endocrinology

## 2015-02-20 NOTE — Telephone Encounter (Signed)
For you -

## 2015-02-26 ENCOUNTER — Encounter: Payer: Self-pay | Admitting: Endocrinology

## 2015-02-26 ENCOUNTER — Telehealth: Payer: Self-pay | Admitting: Endocrinology

## 2015-02-26 ENCOUNTER — Ambulatory Visit (INDEPENDENT_AMBULATORY_CARE_PROVIDER_SITE_OTHER): Payer: BLUE CROSS/BLUE SHIELD | Admitting: Endocrinology

## 2015-02-26 VITALS — BP 122/86 | HR 71 | Temp 98.1°F | Ht 70.0 in | Wt 245.0 lb

## 2015-02-26 DIAGNOSIS — R609 Edema, unspecified: Secondary | ICD-10-CM

## 2015-02-26 DIAGNOSIS — E1121 Type 2 diabetes mellitus with diabetic nephropathy: Secondary | ICD-10-CM

## 2015-02-26 DIAGNOSIS — A630 Anogenital (venereal) warts: Secondary | ICD-10-CM

## 2015-02-26 DIAGNOSIS — R229 Localized swelling, mass and lump, unspecified: Secondary | ICD-10-CM

## 2015-02-26 LAB — BASIC METABOLIC PANEL
BUN: 19 mg/dL (ref 6–23)
CALCIUM: 9.7 mg/dL (ref 8.4–10.5)
CO2: 30 mEq/L (ref 19–32)
Chloride: 104 mEq/L (ref 96–112)
Creatinine, Ser: 1.2 mg/dL (ref 0.40–1.50)
GFR: 78.55 mL/min (ref >60.00)
GLUCOSE: 100 mg/dL — AB (ref 70–99)
Potassium: 3.3 mEq/L — ABNORMAL LOW (ref 3.5–5.1)
Sodium: 141 mEq/L (ref 135–145)

## 2015-02-26 LAB — HEMOGLOBIN A1C: HEMOGLOBIN A1C: 6.5 % (ref 4.6–6.5)

## 2015-02-26 LAB — BRAIN NATRIURETIC PEPTIDE: Pro B Natriuretic peptide (BNP): 74 pg/mL (ref 0.0–100.0)

## 2015-02-26 MED ORDER — AMLODIPINE BESYLATE 2.5 MG PO TABS: 2.5000 mg | ORAL_TABLET | Freq: Every day | ORAL | Status: DC

## 2015-02-26 NOTE — Telephone Encounter (Signed)
error 

## 2015-02-26 NOTE — Telephone Encounter (Signed)
Patient no showed today's appt. Please advise on how to follow up. °A. No follow up necessary. °B. Follow up urgent. Contact patient immediately. °C. Follow up necessary. Contact patient and schedule visit in ___ days. °D. Follow up advised. Contact patient and schedule visit in ____weeks. ° °

## 2015-02-26 NOTE — Patient Instructions (Addendum)
Please reduce the amlodipine to 2.5 mg daily.  i have sent a prescription to your pharmacy.   If necessary, I can prescribe for you an additional fluid pill.   blood tests are being requested for you today.  We'll let you know about the results.   Please come back for a regular physical appointment in 4 months (must ne after 07/05/15)

## 2015-02-26 NOTE — Progress Notes (Signed)
   Subjective:    Patient ID: Roberto Rivera, male    DOB: 06-13-1951, 64 y.o.   MRN: [id number]  HPI Pt returns for f/u of diabetes mellitus: DM type: 2 Dx'ed: 2011 Complications: none Therapy: none DKA: never Severe hypoglycemia: never Pancreatitis: never Other: he did not tolerate pioglitizone (edema).   Interval history: he has not recently taken any medication for diabetes.   Past Medical History  Diagnosis Date  . Hypertension   . Diabetes mellitus   . Hypercholesteremia   . ALLERGIC RHINITIS 04/01/2007  . DIABETES MELLITUS, TYPE II 04/01/2007  . 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 HYPERCHOLESTEROLEMIA 02/07/2008  . HYPERTENSION 04/01/2007  . MICROCYTOSIS 02/07/2008  . OSTEOARTHRITIS, SPINE 02/07/2008  . THYROID CYST 02/07/2008    No past surgical history on file.  History   Social History  . Marital Status: Married    Spouse Name: N/A  . Number of Children: N/A  . Years of Education: N/A   Occupational History  . [employer] [place]   Social History Main Topics  . Smoking status: Current Every Day Smoker -- 1.00 packs/day    Types: Cigarettes  . Smokeless tobacco: Not on file  . Alcohol Use: No  . Drug Use: No  . Sexual Activity: Not on file   Other Topics Concern  . Not on file   Social History Narrative   Divorced x many years    Current Outpatient Prescriptions on File Prior to Visit  Medication Sig Dispense Refill  . glucose blood (ONE TOUCH ULTRA TEST) test strip Use as instructed once daily and lancets 250.00    . losartan-hydrochlorothiazide (HYZAAR) 100-25 MG per tablet TAKE 1 TABLET BY MOUTH EVERY DAY 30 tablet 0  . metoprolol succinate (TOPROL-XL) 50 MG 24 hr tablet TAKE 1/2 TABLET BY MOUTH DAILY 30 tablet 0  . simvastatin (ZOCOR) 40 MG tablet TAKE 1 TABLET BY MOUTH AT BEDTIME 30 tablet 3   No current facility-administered medications on file prior to visit.    No Known Allergies  Family History  Problem Relation Age of Onset  .  Cancer Mother     had uncertain type of cancer    BP 122/86 mmHg  Pulse 71  Temp(Src) 98.1 F (36.7 C) (Oral)  Ht 5\' 10"  (1.778 m)  Wt 245 lb (111.131 kg)  BMI 35.15 kg/m2  SpO2 97%  Review of Systems Denies weight change and sob    Objective:   Physical Exam VITAL SIGNS:  See vs page GENERAL: no distress Pulses: dorsalis pedis intact bilat.   MSK: no deformity of the feet CV: 1+ bilat leg edema Skin:  no ulcer on the feet.  normal color and temp on the feet. Neuro: sensation is intact to touch on the feet   Lab Results  Component Value Date   HGBA1C 6.5 02/26/2015       Assessment & Plan:  Edema: worse DM: well-controlled, despite discontinuation of pioglitizone  Patient is advised the following: Patient Instructions  Please reduce the amlodipine to 2.5 mg daily.  i have sent a prescription to your pharmacy.   If necessary, I can prescribe for you an additional fluid pill.   blood tests are being requested for you today.  We'll let you know about the results.   Please come back for a regular physical appointment in 4 months (must ne after 07/05/15)

## 2015-03-26 ENCOUNTER — Other Ambulatory Visit: Payer: Self-pay | Admitting: Endocrinology

## 2015-04-23 ENCOUNTER — Other Ambulatory Visit: Payer: Self-pay | Admitting: Endocrinology

## 2015-05-10 ENCOUNTER — Other Ambulatory Visit: Payer: Self-pay | Admitting: Endocrinology

## 2015-05-22 ENCOUNTER — Other Ambulatory Visit: Payer: Self-pay | Admitting: Endocrinology

## 2015-06-18 ENCOUNTER — Other Ambulatory Visit: Payer: Self-pay | Admitting: Endocrinology

## 2015-07-02 ENCOUNTER — Ambulatory Visit (INDEPENDENT_AMBULATORY_CARE_PROVIDER_SITE_OTHER): Payer: BLUE CROSS/BLUE SHIELD | Admitting: Endocrinology

## 2015-07-02 ENCOUNTER — Encounter: Payer: Self-pay | Admitting: Endocrinology

## 2015-07-02 VITALS — BP 144/96 | HR 68 | Temp 98.5°F | Ht 70.0 in | Wt 245.0 lb

## 2015-07-02 DIAGNOSIS — E1121 Type 2 diabetes mellitus with diabetic nephropathy: Secondary | ICD-10-CM

## 2015-07-02 LAB — POCT GLYCOSYLATED HEMOGLOBIN (HGB A1C): Hemoglobin A1C: 6.4

## 2015-07-02 MED ORDER — FUROSEMIDE 20 MG PO TABS: 20.0000 mg | ORAL_TABLET | Freq: Every day | ORAL | Status: DC

## 2015-07-02 MED ORDER — POTASSIUM CHLORIDE CRYS ER 20 MEQ PO TBCR: 20.0000 meq | EXTENDED_RELEASE_TABLET | Freq: Two times a day (BID) | ORAL | Status: DC

## 2015-07-02 NOTE — Progress Notes (Signed)
   Subjective:    Patient ID: Roberto Rivera, male    DOB: Apr 04, 1951, 64 y.o.   MRN: [id number]  HPI The state of at least three ongoing medical problems is addressed today, with interval history of each noted here: Pt returns for f/u of diabetes mellitus: DM type: 2 Dx'ed: 2011 Complications: none Therapy: none DKA: never Severe hypoglycemia: never Pancreatitis: never Other: he did not tolerate pioglitizone (edema).   Interval history: he has not recently needed any medication for diabetes.  Denies weight change HTN: edema persists.   Hypokalemia: he denies muscle cramps Past Medical History  Diagnosis Date  . Hypertension   . Diabetes mellitus   . Hypercholesteremia   . ALLERGIC RHINITIS 04/01/2007  . DIABETES MELLITUS, TYPE II 04/01/2007  . 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 HYPERCHOLESTEROLEMIA 02/07/2008  . HYPERTENSION 04/01/2007  . MICROCYTOSIS 02/07/2008  . OSTEOARTHRITIS, SPINE 02/07/2008  . THYROID CYST 02/07/2008    No past surgical history on file.  Social History   Social History  . Marital Status: Married    Spouse Name: N/A  . Number of Children: N/A  . Years of Education: N/A   Occupational History  . [employer] [place]   Social History Main Topics  . Smoking status: Current Every Day Smoker -- 1.00 packs/day    Types: Cigarettes  . Smokeless tobacco: Not on file  . Alcohol Use: No  . Drug Use: No  . Sexual Activity: Not on file   Other Topics Concern  . Not on file   Social History Narrative   Divorced x many years    Current Outpatient Prescriptions on File Prior to Visit  Medication Sig Dispense Refill  . amLODipine (NORVASC) 2.5 MG tablet Take 1 tablet (2.5 mg total) by mouth daily. 30 tablet 11  . glucose blood (ONE TOUCH ULTRA TEST) test strip Use as instructed once daily and lancets 250.00    . losartan-hydrochlorothiazide (HYZAAR) 100-25 MG tablet TAKE 1 TABLET BY MOUTH EVERY DAY 30 tablet 5  . metoprolol succinate (TOPROL-XL)  50 MG 24 hr tablet TAKE 1/2 TABLET BY MOUTH DAILY 30 tablet 0  . simvastatin (ZOCOR) 40 MG tablet TAKE 1 TABLET BY MOUTH AT BEDTIME 30 tablet 0  . tadalafil (CIALIS) 20 MG tablet Take 20 mg by mouth daily as needed for erectile dysfunction.     No current facility-administered medications on file prior to visit.    No Known Allergies  Family History  Problem Relation Age of Onset  . Cancer Mother     had uncertain type of cancer    BP 144/96 mmHg  Pulse 68  Temp(Src) 98.5 F (36.9 C) (Oral)  Ht 5\' 10"  (1.778 m)  Wt 245 lb (111.131 kg)  BMI 35.15 kg/m2  SpO2 96%   Review of Systems Denies sob and chest pain    Objective:   Physical Exam VITAL SIGNS:  See vs page GENERAL: no distress Ext: 1+ bilat leg edema   A1c=6.4%    Assessment & Plan:  DM: well-controlled.  I advised diet.   HTN: he needs increased rx Hypokalemia: he needs rx, especially if we increase diuretic rx.   Patient is advised the following: Patient Instructions  i have sent 2 prescriptions to your pharmacy: potassium and for an additional fluid pill.   Please come back for a regular physical appointment in 1 month.

## 2015-07-02 NOTE — Patient Instructions (Addendum)
i have sent 2 prescriptions to your pharmacy: potassium and for an additional fluid pill.   Please come back for a regular physical appointment in 1 month.

## 2015-07-21 ENCOUNTER — Other Ambulatory Visit: Payer: Self-pay | Admitting: Endocrinology

## 2015-07-23 ENCOUNTER — Ambulatory Visit: Payer: BLUE CROSS/BLUE SHIELD | Admitting: Endocrinology

## 2015-07-23 DIAGNOSIS — Z0289 Encounter for other administrative examinations: Secondary | ICD-10-CM

## 2015-08-18 ENCOUNTER — Other Ambulatory Visit: Payer: Self-pay | Admitting: Endocrinology

## 2015-08-20 ENCOUNTER — Other Ambulatory Visit: Payer: Self-pay | Admitting: Endocrinology

## 2015-11-20 ENCOUNTER — Other Ambulatory Visit: Payer: Self-pay | Admitting: Endocrinology

## 2015-12-20 ENCOUNTER — Other Ambulatory Visit: Payer: Self-pay | Admitting: Endocrinology

## 2015-12-25 ENCOUNTER — Other Ambulatory Visit: Payer: Self-pay | Admitting: Endocrinology

## 2016-01-14 ENCOUNTER — Other Ambulatory Visit: Payer: Self-pay | Admitting: Endocrinology

## 2016-01-18 ENCOUNTER — Other Ambulatory Visit: Payer: Self-pay

## 2016-01-18 MED ORDER — SIMVASTATIN 40 MG PO TABS: 40.0000 mg | ORAL_TABLET | Freq: Every day | ORAL | Status: DC

## 2016-01-25 ENCOUNTER — Other Ambulatory Visit: Payer: Self-pay | Admitting: Endocrinology

## 2016-02-22 ENCOUNTER — Other Ambulatory Visit: Payer: Self-pay | Admitting: Endocrinology

## 2016-02-25 ENCOUNTER — Telehealth: Payer: Self-pay | Admitting: Endocrinology

## 2016-02-25 MED ORDER — LOSARTAN POTASSIUM-HCTZ 100-25 MG PO TABS: 1.0000 | ORAL_TABLET | Freq: Every day | ORAL | Status: DC

## 2016-02-25 NOTE — Telephone Encounter (Signed)
Patient called wanting a refill of medication, he didn't not want to make an appointment because of overtime on his job, but couldn't understand why they did not refill his medication and why he had to come in in ordert to get his medication.  I ended the call because patient had started to use profanity.  Just a [employer]

## 2016-02-25 NOTE — Telephone Encounter (Signed)
Rx has been submitted for losartan hctz pending pt's appt.

## 2016-03-10 ENCOUNTER — Encounter: Payer: Self-pay | Admitting: Endocrinology

## 2016-03-10 ENCOUNTER — Ambulatory Visit (INDEPENDENT_AMBULATORY_CARE_PROVIDER_SITE_OTHER): Payer: BLUE CROSS/BLUE SHIELD | Admitting: Endocrinology

## 2016-03-10 VITALS — BP 142/90 | HR 73 | Temp 98.1°F | Ht 69.0 in | Wt 237.0 lb

## 2016-03-10 DIAGNOSIS — R229 Localized swelling, mass and lump, unspecified: Secondary | ICD-10-CM

## 2016-03-10 DIAGNOSIS — Z119 Encounter for screening for infectious and parasitic diseases, unspecified: Secondary | ICD-10-CM

## 2016-03-10 DIAGNOSIS — E7800 Pure hypercholesterolemia, unspecified: Secondary | ICD-10-CM

## 2016-03-10 DIAGNOSIS — Z125 Encounter for screening for malignant neoplasm of prostate: Secondary | ICD-10-CM | POA: Diagnosis not present

## 2016-03-10 DIAGNOSIS — E1121 Type 2 diabetes mellitus with diabetic nephropathy: Secondary | ICD-10-CM | POA: Diagnosis not present

## 2016-03-10 DIAGNOSIS — I10 Essential (primary) hypertension: Secondary | ICD-10-CM

## 2016-03-10 DIAGNOSIS — Z0000 Encounter for general adult medical examination without abnormal findings: Secondary | ICD-10-CM

## 2016-03-10 LAB — URINALYSIS, ROUTINE W REFLEX MICROSCOPIC
Bilirubin Urine: NEGATIVE
Ketones, ur: NEGATIVE
Leukocytes, UA: NEGATIVE
Nitrite: NEGATIVE
PH: 5.5 (ref 5.0–8.0)
SPECIFIC GRAVITY, URINE: 1.025 (ref 1.000–1.030)
Total Protein, Urine: 30 — AB
UROBILINOGEN UA: 0.2 (ref 0.0–1.0)
Urine Glucose: NEGATIVE
WBC, UA: NONE SEEN (ref 0–?)

## 2016-03-10 LAB — TSH: TSH: 0.85 u[IU]/mL (ref 0.35–4.50)

## 2016-03-10 LAB — CBC WITH DIFFERENTIAL/PLATELET
BASOS ABS: 0 10*3/uL (ref 0.0–0.1)
Basophils Relative: 0.3 % (ref 0.0–3.0)
EOS PCT: 0.8 % (ref 0.0–5.0)
Eosinophils Absolute: 0.1 10*3/uL (ref 0.0–0.7)
HEMATOCRIT: 44.9 % (ref 39.0–52.0)
HEMOGLOBIN: 14.7 g/dL (ref 13.0–17.0)
LYMPHS ABS: 2.9 10*3/uL (ref 0.7–4.0)
LYMPHS PCT: 29 % (ref 12.0–46.0)
MCHC: 32.6 g/dL (ref 30.0–36.0)
MCV: 78.6 fl (ref 78.0–100.0)
MONOS PCT: 9 % (ref 3.0–12.0)
Monocytes Absolute: 0.9 10*3/uL (ref 0.1–1.0)
NEUTROS PCT: 60.9 % (ref 43.0–77.0)
Neutro Abs: 6 10*3/uL (ref 1.4–7.7)
Platelets: 194 10*3/uL (ref 150.0–400.0)
RBC: 5.72 Mil/uL (ref 4.22–5.81)
RDW: 15.8 % — ABNORMAL HIGH (ref 11.5–15.5)
WBC: 9.9 10*3/uL (ref 4.0–10.5)

## 2016-03-10 LAB — BASIC METABOLIC PANEL
BUN: 24 mg/dL — AB (ref 6–23)
CHLORIDE: 105 meq/L (ref 96–112)
CO2: 29 meq/L (ref 19–32)
CREATININE: 1.4 mg/dL (ref 0.40–1.50)
Calcium: 9.2 mg/dL (ref 8.4–10.5)
GFR: 65.53 mL/min (ref >60.00)
GLUCOSE: 84 mg/dL (ref 70–99)
Potassium: 3.4 mEq/L — ABNORMAL LOW (ref 3.5–5.1)
Sodium: 142 mEq/L (ref 135–145)

## 2016-03-10 LAB — LIPID PANEL
CHOL/HDL RATIO: 2
Cholesterol: 94 mg/dL (ref 0–200)
HDL: 37.8 mg/dL — AB (ref >39.00)
LDL CALC: 46 mg/dL (ref 0–99)
NonHDL: 55.71
TRIGLYCERIDES: 50 mg/dL (ref 0.0–149.0)
VLDL: 10 mg/dL (ref 0.0–40.0)

## 2016-03-10 LAB — HEPATIC FUNCTION PANEL
ALBUMIN: 3.8 g/dL (ref 3.5–5.2)
ALK PHOS: 86 U/L (ref 39–117)
ALT: 14 U/L (ref 0–53)
AST: 17 U/L (ref 0–37)
Bilirubin, Direct: 0.2 mg/dL (ref 0.0–0.3)
TOTAL PROTEIN: 6.4 g/dL (ref 6.0–8.3)
Total Bilirubin: 0.7 mg/dL (ref 0.2–1.2)

## 2016-03-10 LAB — PSA: PSA: 1.1 ng/mL (ref 0.10–4.00)

## 2016-03-10 LAB — MICROALBUMIN / CREATININE URINE RATIO
Creatinine,U: 175.3 mg/dL
Microalb Creat Ratio: 20.3 mg/g (ref 0.0–30.0)
Microalb, Ur: 35.6 mg/dL — ABNORMAL HIGH (ref 0.0–1.9)

## 2016-03-10 LAB — POCT GLYCOSYLATED HEMOGLOBIN (HGB A1C): HEMOGLOBIN A1C: 6.3

## 2016-03-10 MED ORDER — DOXYCYCLINE HYCLATE 100 MG PO TABS: 100.0000 mg | ORAL_TABLET | Freq: Two times a day (BID) | ORAL | Status: DC

## 2016-03-10 MED ORDER — CLOTRIMAZOLE-BETAMETHASONE 1-0.05 % EX CREA: 1.0000 "application " | TOPICAL_CREAM | Freq: Two times a day (BID) | CUTANEOUS | Status: DC

## 2016-03-10 NOTE — Patient Instructions (Addendum)
I have sent 2 prescriptions to your pharmacy: skin cream for your feet, and antibiotic.  blood tests are requested for you today.  We'll let you know about the results.  Please consider these measures for your health:  minimize alcohol.  Do not use tobacco products.  Have a colonoscopy at least every 10 years from age 65.   Keep firearms safely stored.  Always use seat belts.  have working smoke alarms in your home.  See an eye doctor and dentist regularly.  Never drive under the influence of alcohol or drugs (including prescription drugs).   It is critically important to prevent falling down (keep floor areas well-lit, dry, and free of loose objects.  If you have a cane, walker, or wheelchair, you should use it, even for short trips around the house.  Wear flat-soled shoes.  Also, try not to rush).

## 2016-03-10 NOTE — Progress Notes (Signed)
we discussed code status.  pt requests full code, but would not want to be started or maintained on artificial life-support measures if there was not a reasonable chance of recovery 

## 2016-03-10 NOTE — Progress Notes (Signed)
Pre visit review using our clinic review tool, if applicable. No additional management support is needed unless otherwise documented below in the visit note. 

## 2016-03-10 NOTE — Progress Notes (Signed)
Subjective:    Patient ID: [name], male    DOB: 29-Jun-1951, 65 y.o.   MRN: [id number]  HPI Pt is here for regular wellness examination, and is feeling pretty well in general, and says chronic med probs are stable, except as noted below Past Medical History  Diagnosis Date  . Hypertension   . Diabetes mellitus   . Hypercholesteremia   . ALLERGIC RHINITIS 04/01/2007  . DIABETES MELLITUS, TYPE II 04/01/2007  . 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 HYPERCHOLESTEROLEMIA 02/07/2008  . HYPERTENSION 04/01/2007  . MICROCYTOSIS 02/07/2008  . OSTEOARTHRITIS, SPINE 02/07/2008  . THYROID CYST 02/07/2008    No past surgical history on file.  Social History   Social History  . Marital Status: Married    Spouse Name: N/A  . Number of Children: N/A  . Years of Education: N/A   Occupational History  . [employer]   Social History Main Topics  . Smoking status: Current Every Day Smoker -- 1.00 packs/day    Types: Cigarettes  . Smokeless tobacco: Never Used  . Alcohol Use: No  . Drug Use: No  . Sexual Activity: Not on file   Other Topics Concern  . Not on file   Social History Narrative   Divorced x many years    Current Outpatient Prescriptions on File Prior to Visit  Medication Sig Dispense Refill  . amLODipine (NORVASC) 2.5 MG tablet Take 1 tablet (2.5 mg total) by mouth daily. 30 tablet 11  . glucose blood (ONE TOUCH ULTRA TEST) test strip Use as instructed once daily and lancets 250.00    . losartan-hydrochlorothiazide (HYZAAR) 100-25 MG tablet Take 1 tablet by mouth daily. 30 tablet 2  . metoprolol succinate (TOPROL-XL) 50 MG 24 hr tablet TAKE 1/2 TABLET BY MOUTH EVERY DAY 30 tablet 3  . simvastatin (ZOCOR) 40 MG tablet Take 1 tablet (40 mg total) by mouth at bedtime. 30 tablet 2  . tadalafil (CIALIS) 20 MG tablet Take 20 mg by mouth daily as needed for erectile dysfunction.    . potassium chloride SA (K-DUR,KLOR-CON) 20 MEQ tablet Take 1 tablet (20 mEq total)  by mouth 2 (two) times daily. (Patient not taking: Reported on 03/10/2016) 30 tablet 11   No current facility-administered medications on file prior to visit.    No Known Allergies  Family History  Problem Relation Age of Onset  . Cancer Mother     had uncertain type of cancer    BP 142/90 mmHg  Pulse 73  Temp(Src) 98.1 F (36.7 C) (Oral)  Ht  (1.753 m)  Wt 237 lb (107.502 kg)  BMI 34.98 kg/m2  SpO2 97%   Review of Systems  HENT: Negative for hearing loss.   Eyes: Negative for visual disturbance.  Respiratory: Negative for shortness of breath.   Cardiovascular: Negative for chest pain.  Gastrointestinal: Negative for anal bleeding.  Endocrine: Negative for cold intolerance.  Genitourinary: Negative for hematuria and difficulty urinating.  Musculoskeletal: Negative for back pain.  Skin: Negative for rash.  Allergic/Immunologic: Negative for environmental allergies.  Neurological: Negative for syncope and numbness.  Hematological: Bruises/bleeds easily.  Psychiatric/Behavioral: Negative for dysphoric mood.   He has lost a few lbs, due to his efforts.      Objective:   Physical Exam  VS: see vs page GEN: no distress HEAD: head: no deformity eyes: no periorbital swelling, no proptosis external nose and ears are normal mouth: no lesion seen NECK: supple, thyroid is not enlarged CHEST  WALL: no deformity LUNGS: clear to auscultation BREASTS:  No gynecomastia CV: reg rate and rhythm, no murmur ABD: abdomen is soft, nontender.  no hepatosplenomegaly.  not distended.  no hernia RECTAL/PROSTATE: declined MUSCULOSKELETAL: muscle bulk and strength are grossly normal.  no obvious joint swelling.  gait is normal and steady EXTEMITIES: no deformity.  no ulcer on the feet.  feet are of normal color and temp.  no edema PULSES: dorsalis pedis intact bilat.  no carotid bruit NEURO:  cn 2-12 grossly intact.   readily moves all 4's.  sensation is intact to touch on the  feet SKIN:  Normal texture and temperature.  No rash or suspicious lesion is visible.   NODES:  None palpable at the neck PSYCH: alert, well-oriented.  Does not appear anxious nor depressed.    i personally reviewed electrocardiogram tracing (today): Indication: HTN Impression: no change from 2015    Assessment & Plan:  Wellness visit today, with problems stable, except as noted.   SEPARATE EVALUATION FOLLOWS--EACH PROBLEM HERE IS NEW, NOT RESPONDING TO TREATMENT, OR POSES SIGNIFICANT RISK TO THE PATIENT'S HEALTH: HISTORY OF THE PRESENT ILLNESS: Pt states few days of slight pain at the right axilla, and assoc nodule PAST MEDICAL HISTORY reviewed and up to date today REVIEW OF SYSTEMS: Denies fever.  Right middle finger disorder persists, despite visit to derm. PHYSICAL EXAMINATION: VITAL SIGNS:  See vs page GENERAL: no distress Right axilla: 1-2 cm pustule, with minimal surrounding erythema Pulses: dorsalis pedis intact bilat.   MSK: no deformity of the feet CV: 1+ bilat leg edema Skin:  no ulcer on the feet, but there is eczematous rash, and heavy calluses.  normal color and temp on the feet. Neuro: sensation is intact to touch on the feet Ext: left middle finger has a bandaid.  There is bilateral onychomycosis of the toenails.  LAB/XRAY RESULTS: IMPRESSION: Folliculitis, new. Tinea pedis, new.  Skin problem of the middle finger, persistent. PLAN:  Ref back to derm I have sent a prescription to your pharmacy, for an antibiotic pill and lotrisone.  Romero BellingELLISON, Milam Allbaugh, MD

## 2016-03-11 LAB — HEPATITIS C ANTIBODY
HCV AB: NEGATIVE
HCV AB: NEGATIVE

## 2016-03-11 LAB — HIV ANTIBODY (ROUTINE TESTING W REFLEX): HIV 1&2 Ab, 4th Generation: NONREACTIVE

## 2016-03-12 ENCOUNTER — Other Ambulatory Visit: Payer: Self-pay | Admitting: Endocrinology

## 2016-03-31 ENCOUNTER — Telehealth: Payer: Self-pay

## 2016-03-31 NOTE — Telephone Encounter (Signed)
-----   Message from Romero BellingSean Ellison, MD sent at 03/11/2016  9:12 AM EDT ----- please call patient: Good results.

## 2016-03-31 NOTE — Telephone Encounter (Signed)
Results mailed to the pt

## 2016-04-12 ENCOUNTER — Other Ambulatory Visit: Payer: Self-pay | Admitting: Endocrinology

## 2016-04-22 ENCOUNTER — Other Ambulatory Visit: Payer: Self-pay | Admitting: Endocrinology

## 2016-05-22 ENCOUNTER — Other Ambulatory Visit: Payer: Self-pay | Admitting: Endocrinology

## 2016-05-25 ENCOUNTER — Other Ambulatory Visit: Payer: Self-pay | Admitting: Endocrinology

## 2016-06-21 ENCOUNTER — Other Ambulatory Visit: Payer: Self-pay | Admitting: Endocrinology

## 2016-07-24 ENCOUNTER — Other Ambulatory Visit: Payer: Self-pay | Admitting: Endocrinology

## 2016-08-22 ENCOUNTER — Other Ambulatory Visit: Payer: Self-pay | Admitting: Endocrinology

## 2016-09-08 ENCOUNTER — Ambulatory Visit: Payer: 59 | Admitting: Endocrinology

## 2016-09-20 ENCOUNTER — Other Ambulatory Visit: Payer: Self-pay | Admitting: Endocrinology

## 2016-10-21 ENCOUNTER — Other Ambulatory Visit: Payer: Self-pay | Admitting: Endocrinology

## 2016-11-23 ENCOUNTER — Other Ambulatory Visit: Payer: Self-pay | Admitting: Endocrinology

## 2016-12-25 ENCOUNTER — Other Ambulatory Visit: Payer: Self-pay | Admitting: Endocrinology

## 2017-03-31 ENCOUNTER — Other Ambulatory Visit: Payer: Self-pay | Admitting: Endocrinology

## 2017-04-13 ENCOUNTER — Other Ambulatory Visit: Payer: Self-pay | Admitting: Endocrinology

## 2017-04-23 ENCOUNTER — Encounter (HOSPITAL_COMMUNITY): Payer: Self-pay | Admitting: *Deleted

## 2017-04-23 ENCOUNTER — Ambulatory Visit (HOSPITAL_COMMUNITY)
Admission: EM | Admit: 2017-04-23 | Discharge: 2017-04-23 | Disposition: A | Discharge status: Home / Self Care | Payer: Medicare Other | Attending: Emergency Medicine | Admitting: Emergency Medicine

## 2017-04-23 DIAGNOSIS — E119 Type 2 diabetes mellitus without complications: Secondary | ICD-10-CM

## 2017-04-23 DIAGNOSIS — F419 Anxiety disorder, unspecified: Secondary | ICD-10-CM | POA: Diagnosis not present

## 2017-04-23 DIAGNOSIS — I10 Essential (primary) hypertension: Secondary | ICD-10-CM | POA: Diagnosis not present

## 2017-04-23 LAB — GLUCOSE, CAPILLARY: GLUCOSE-CAPILLARY: 108 mg/dL — AB (ref 65–99)

## 2017-04-23 NOTE — Discharge Instructions (Signed)
Right down your blood sugars and take him to your doctor. Same thing for your blood pressures. Increase your amlodipine 25 mg a day. For worsening, new symptoms or problems call 911 or go to emergency department. Especially if these are passing out episodes, chest pain, shortness of breath, sweating or nausea or vomiting.

## 2017-04-23 NOTE — ED Triage Notes (Signed)
Pt  Has  A  History  Of  Hypertension  He  Reports   sev  Days  Ago he  Got   Dizzy  And   Was  Having  Some   Decrease  In hand  Coordination     He  States  He  Does  Not  Take  Any  Diabetes  meds  Now  As  He  Was  Taken off  Them  He  Does  However take blood  Pressure  meds     At this  Time  He  Feels  Somewhat  Better  And    Is  Awake  And  Alert

## 2017-04-23 NOTE — ED Provider Notes (Signed)
[facility]    CSN: [id number] Arrival date & time: 04/23/17  1444     History   Chief Complaint Chief Complaint  Patient presents with  . Hypertension    HPI Roberto Rivera is a 65 y.o. male.   66 year old male with history of smoking one pack per day, hypertension, type 2 diabetes mellitus, dyslipidemia and metabolic syndrome presents with concern over fluctuations in his blood sugars and elevation of his blood pressure over the past few days. He was taken off his 08 days by his PCP sometime ago. His blood sugars have been going up to 220 down to 118. Currently it is 108 in the urgent care. His also concerned about his blood pressure where systolics have been between 177 and 205 over the past few days. He said no chest pain or shortness of breath. No edema. He was concerned primarily about the numbers although he did have a short episode of lightheadedness and discoordination. This did not last long and he does not have that today. His review of systems is negative today. He does have a PCP and plans to see him soon.      Past Medical History:  Diagnosis Date  . ALLERGIC RHINITIS 04/01/2007  . Diabetes mellitus   . DIABETES MELLITUS, TYPE II 04/01/2007  . 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 Hypercholesteremia   . HYPERCHOLESTEROLEMIA 02/07/2008  . Hypertension   . HYPERTENSION 04/01/2007  . MICROCYTOSIS 02/07/2008  . OSTEOARTHRITIS, SPINE 02/07/2008  . THYROID CYST 02/07/2008    Patient Active Problem List   Diagnosis Date Noted  . Screening examination for infectious disease 03/10/2016  . Skin nodule 02/26/2015  . Routine general medical examination at a health care facility 04/26/2013  . Rash and nonspecific skin eruption 04/26/2013  . Left knee pain 04/11/2013  . Diabetes (HCC) 10/13/2011  . Screening for prostate cancer 10/13/2011  . Encounter for long-term (current) use of other medications 10/13/2011  . SMOKER 02/04/2010  . EDEMA 02/04/2010  .  PROTEINURIA, MILD 01/29/2009  . THYROID CYST 02/07/2008  . HYPERCHOLESTEROLEMIA 02/07/2008  . 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 OSTEOARTHRITIS, SPINE 02/07/2008  . MICROCYTOSIS 02/07/2008  . Essential hypertension 04/01/2007  . ALLERGIC RHINITIS 04/01/2007    History reviewed. No pertinent surgical history.     Home Medications    Prior to Admission medications   Medication Sig Start Date End Date Taking? Authorizing Provider  amLODipine (NORVASC) 2.5 MG tablet TAKE 1 TABLET(2.5 MG) BY MOUTH DAILY 03/31/17   Romero Belling, MD  clotrimazole-betamethasone (LOTRISONE) cream Apply 1 application topically 2 (two) times daily. 03/10/16   Romero Belling, MD  doxycycline (VIBRA-TABS) 100 MG tablet Take 1 tablet (100 mg total) by mouth 2 (two) times daily. 03/10/16   Romero Belling, MD  glucose blood (ONE TOUCH ULTRA TEST) test strip Use as instructed once daily and lancets 250.00    [provider]  losartan-hydrochlorothiazide (HYZAAR) 100-25 MG tablet TAKE 1 TABLET BY MOUTH DAILY 03/31/17   Romero Belling, MD  metoprolol succinate (TOPROL-XL) 50 MG 24 hr tablet TAKE ONE-HALF TABLET BY MOUTH EVERY DAY 03/31/17   Romero Belling, MD  simvastatin (ZOCOR) 40 MG tablet TAKE 1 TABLET(40 MG) BY MOUTH AT BEDTIME 04/13/17   Romero Belling, MD  tadalafil (CIALIS) 20 MG tablet Take 20 mg by mouth daily as needed for erectile dysfunction.    [provider]    Family History Family History  Problem Relation Age of Onset  . Cancer Mother  had uncertain type of cancer    Social History Social History  Substance Use Topics  . Smoking status: Current Every Day Smoker    Packs/day: 1.00    Types: Cigarettes  . Smokeless tobacco: Never Used  . Alcohol use No     Allergies   Patient has no known allergies.   Review of Systems Review of Systems  Constitutional: Negative.   HENT: Negative.   Respiratory: Negative.   Cardiovascular: Negative for chest pain,  palpitations and leg swelling.  Gastrointestinal: Negative.   Genitourinary: Negative.   Neurological: Negative for facial asymmetry, speech difficulty, numbness and headaches.  Psychiatric/Behavioral: The patient is nervous/anxious.   All other systems reviewed and are negative.    Physical Exam Triage Vital Signs ED Triage Vitals [04/23/17 1614]  Enc Vitals Group     BP (!) 180/100     Pulse Rate 78     Resp 18     Temp 98.6 F (37 C)     Temp Source Oral     SpO2 100 %     Weight      Height      Head Circumference      Peak Flow      Pain Score      Pain Loc      Pain Edu?      Excl. in GC?    No data found.   Updated Vital Signs BP (!) 180/100 (BP Location: Left Arm)   Pulse 78   Temp 98.6 F (37 C) (Oral)   Resp 18   SpO2 100%   Visual Acuity Right Eye Distance:   Left Eye Distance:   Bilateral Distance:    Right Eye Near:   Left Eye Near:    Bilateral Near:     Physical Exam  Constitutional: He is oriented to person, place, and time. He appears well-developed and well-nourished. No distress.  Eyes: EOM are normal.  Neck: Normal range of motion. Neck supple.  Cardiovascular: Normal rate.   Pulmonary/Chest: Effort normal and breath sounds normal. No respiratory distress.  Musculoskeletal: He exhibits no edema.  Neurological: He is alert and oriented to person, place, and time. He exhibits normal muscle tone.  Skin: Skin is warm and dry.  Psychiatric: He has a normal mood and affect.  Nursing note and vitals reviewed.    UC Treatments / Results  Labs (all labs ordered are listed, but only abnormal results are displayed) Labs Reviewed  GLUCOSE, CAPILLARY - Abnormal; Notable for the following:       Result Value   Glucose-Capillary 108 (*)    All other components within normal limits    EKG  EKG Interpretation None       Radiology No results found.  Procedures Procedures (including critical care time)  Medications Ordered in  UC Medications - No data to display   Initial Impression / Assessment and Plan / UC Course  I have reviewed the triage vital signs and the nursing notes.  Pertinent labs & imaging results that were available during my care of the patient were reviewed by me and considered in my medical decision making (see chart for details).    Right down your blood sugars and take him to your doctor. Same thing for your blood pressures. Increase your amlodipine 25 mg a day. For worsening, new symptoms or problems call 911 or go to emergency department. Especially if these are passing out episodes, chest pain, shortness of breath, sweating or  nausea or vomiting.    Final Clinical Impressions(s) / UC Diagnoses   Final diagnoses:  Anxiety  Essential hypertension  Type 2 diabetes mellitus without complication, without long-term current use of insulin [facility](HCC)    New Prescriptions Current Discharge Medication List       Controlled Substance Prescriptions [place] Controlled Substance Registry consulted? Not Applicable   Hayden RasmussenMabe, Arleigh Odowd, NP 04/23/17 1723

## 2017-04-25 ENCOUNTER — Encounter: Payer: Self-pay | Admitting: Endocrinology

## 2017-04-25 ENCOUNTER — Telehealth: Payer: Self-pay | Admitting: Endocrinology

## 2017-04-25 ENCOUNTER — Ambulatory Visit (INDEPENDENT_AMBULATORY_CARE_PROVIDER_SITE_OTHER): Payer: Medicare Other | Admitting: Endocrinology

## 2017-04-25 VITALS — BP 152/100 | HR 77 | Wt 237.0 lb

## 2017-04-25 DIAGNOSIS — R9431 Abnormal electrocardiogram [ECG] [EKG]: Secondary | ICD-10-CM | POA: Insufficient documentation

## 2017-04-25 DIAGNOSIS — E7800 Pure hypercholesterolemia, unspecified: Secondary | ICD-10-CM

## 2017-04-25 DIAGNOSIS — Z125 Encounter for screening for malignant neoplasm of prostate: Secondary | ICD-10-CM

## 2017-04-25 DIAGNOSIS — E1121 Type 2 diabetes mellitus with diabetic nephropathy: Secondary | ICD-10-CM

## 2017-04-25 LAB — HEPATIC FUNCTION PANEL
ALBUMIN: 4 g/dL (ref 3.5–5.2)
ALT: 11 U/L (ref 0–53)
AST: 15 U/L (ref 0–37)
Alkaline Phosphatase: 89 U/L (ref 39–117)
Bilirubin, Direct: 0.2 mg/dL (ref 0.0–0.3)
Total Bilirubin: 0.7 mg/dL (ref 0.2–1.2)
Total Protein: 6.6 g/dL (ref 6.0–8.3)

## 2017-04-25 LAB — URINALYSIS, ROUTINE W REFLEX MICROSCOPIC
BILIRUBIN URINE: NEGATIVE
Hgb urine dipstick: NEGATIVE
Ketones, ur: NEGATIVE
LEUKOCYTES UA: NEGATIVE
Nitrite: NEGATIVE
PH: 5.5 (ref 5.0–8.0)
RBC / HPF: NONE SEEN (ref 0–?)
SPECIFIC GRAVITY, URINE: 1.01 (ref 1.000–1.030)
UROBILINOGEN UA: 0.2 (ref 0.0–1.0)
Urine Glucose: NEGATIVE
WBC, UA: NONE SEEN (ref 0–?)

## 2017-04-25 LAB — CBC WITH DIFFERENTIAL/PLATELET
BASOS ABS: 0 10*3/uL (ref 0.0–0.1)
Basophils Relative: 0.4 % (ref 0.0–3.0)
EOS ABS: 0.1 10*3/uL (ref 0.0–0.7)
Eosinophils Relative: 1.3 % (ref 0.0–5.0)
HEMATOCRIT: 48.1 % (ref 39.0–52.0)
HEMOGLOBIN: 15.8 g/dL (ref 13.0–17.0)
LYMPHS PCT: 35.5 % (ref 12.0–46.0)
Lymphs Abs: 2.7 10*3/uL (ref 0.7–4.0)
MCHC: 32.8 g/dL (ref 30.0–36.0)
MCV: 79.4 fl (ref 78.0–100.0)
MONO ABS: 0.7 10*3/uL (ref 0.1–1.0)
Monocytes Relative: 9.4 % (ref 3.0–12.0)
NEUTROS PCT: 53.4 % (ref 43.0–77.0)
Neutro Abs: 4.1 10*3/uL (ref 1.4–7.7)
PLATELETS: 207 10*3/uL (ref 150.0–400.0)
RBC: 6.05 Mil/uL — AB (ref 4.22–5.81)
RDW: 15.7 % — AB (ref 11.5–15.5)
WBC: 7.6 10*3/uL (ref 4.0–10.5)

## 2017-04-25 LAB — BASIC METABOLIC PANEL
BUN: 30 mg/dL — ABNORMAL HIGH (ref 6–23)
CHLORIDE: 100 meq/L (ref 96–112)
CO2: 27 meq/L (ref 19–32)
CREATININE: 1.44 mg/dL (ref 0.40–1.50)
Calcium: 9.7 mg/dL (ref 8.4–10.5)
GFR: 63.21 mL/min (ref >60.00)
Glucose, Bld: 110 mg/dL — ABNORMAL HIGH (ref 70–99)
POTASSIUM: 3.1 meq/L — AB (ref 3.5–5.1)
Sodium: 137 mEq/L (ref 135–145)

## 2017-04-25 LAB — PSA: PSA: 1.85 ng/mL (ref 0.10–4.00)

## 2017-04-25 LAB — TSH: TSH: 1.1 u[IU]/mL (ref 0.35–4.50)

## 2017-04-25 LAB — LIPID PANEL
CHOLESTEROL: 111 mg/dL (ref 0–200)
HDL: 31.9 mg/dL — AB (ref >39.00)
LDL Cholesterol: 63 mg/dL (ref 0–99)
NonHDL: 78.74
Total CHOL/HDL Ratio: 3
Triglycerides: 77 mg/dL (ref 0.0–149.0)
VLDL: 15.4 mg/dL (ref 0.0–40.0)

## 2017-04-25 LAB — POCT GLYCOSYLATED HEMOGLOBIN (HGB A1C): HEMOGLOBIN A1C: 6.6

## 2017-04-25 LAB — MICROALBUMIN / CREATININE URINE RATIO
Creatinine,U: 113.8 mg/dL
MICROALB UR: 13.5 mg/dL — AB (ref 0.0–1.9)
MICROALB/CREAT RATIO: 11.9 mg/g (ref 0.0–30.0)

## 2017-04-25 MED ORDER — POTASSIUM CHLORIDE ER 10 MEQ PO TBCR: 10.0000 meq | EXTENDED_RELEASE_TABLET | Freq: Every day | ORAL | 11 refills | Status: DC

## 2017-04-25 MED ORDER — METFORMIN HCL ER 500 MG PO TB24: 500.0000 mg | ORAL_TABLET | Freq: Every day | ORAL | 3 refills | Status: DC

## 2017-04-25 MED ORDER — AMLODIPINE BESYLATE 10 MG PO TABS: 10.0000 mg | ORAL_TABLET | Freq: Every day | ORAL | 11 refills | Status: DC

## 2017-04-25 MED ORDER — ROSUVASTATIN CALCIUM 5 MG PO TABS: 5.0000 mg | ORAL_TABLET | Freq: Every day | ORAL | 11 refills | Status: DC

## 2017-04-25 NOTE — Progress Notes (Signed)
Subjective:    Patient ID: Roberto Rivera, male    DOB: 1950-08-28, 66 y.o.   MRN: [id number]  HPI  Pt returns for f/u of diabetes mellitus: DM type: 2 Dx'ed: 2011 Complications: none Therapy: none DKA: never Severe hypoglycemia: never Pancreatitis: never Other: he did not tolerate pioglitizone (edema).   Interval history: Meter is downloaded today, and the printout is scanned into the record.  cbg varies from 95-205.  There is no trend throughout the day. Pt states few weeks of slight non-exertional pain at the right side of the chest.  No assoc sob.  He was seen at UC 2 days ago, for HTN.  norvasc was increased to 5 mg qd.    Past Medical History:  Diagnosis Date  . ALLERGIC RHINITIS 04/01/2007  . Diabetes mellitus   . DIABETES MELLITUS, TYPE II 04/01/2007  . 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 Hypercholesteremia   . HYPERCHOLESTEROLEMIA 02/07/2008  . Hypertension   . HYPERTENSION 04/01/2007  . MICROCYTOSIS 02/07/2008  . OSTEOARTHRITIS, SPINE 02/07/2008  . THYROID CYST 02/07/2008    No past surgical history on file.  Social History   Social History  . Marital status: Married    Spouse name: N/A  . Number of children: N/A  . Years of education: N/A   Occupational History  . [employer]   Social History Main Topics  . Smoking status: Current Every Day Smoker    Packs/day: 1.00    Types: Cigarettes  . Smokeless tobacco: Never Used  . Alcohol use No  . Drug use: No  . Sexual activity: Not on file   Other Topics Concern  . Not on file   Social History Narrative   Divorced x many years    Current Outpatient Prescriptions on File Prior to Visit  Medication Sig Dispense Refill  . clotrimazole-betamethasone (LOTRISONE) cream Apply 1 application topically 2 (two) times daily. 30 g 0  . glucose blood (ONE TOUCH ULTRA TEST) test strip Use as instructed once daily and lancets 250.00    . losartan-hydrochlorothiazide (HYZAAR) 100-25 MG tablet TAKE 1  TABLET BY MOUTH DAILY 90 tablet 0  . metoprolol succinate (TOPROL-XL) 50 MG 24 hr tablet TAKE ONE-HALF TABLET BY MOUTH EVERY DAY 45 tablet 0  . tadalafil (CIALIS) 20 MG tablet Take 20 mg by mouth daily as needed for erectile dysfunction.     No current facility-administered medications on file prior to visit.     No Known Allergies  Family History  Problem Relation Age of Onset  . Cancer Mother        had uncertain type of cancer    BP (!) 152/100   Pulse 77   Wt 237 lb (107.5 kg)   SpO2 97%   BMI 35.00 kg/m    Review of Systems Denies LOC and edema.  He has tearing from the left eye.  He hasn't recently seen opthal    Objective:   Physical Exam VITAL SIGNS:  See vs page GENERAL: no distress LUNGS:  Clear to auscultation HEART:  Regular rate and rhythm without murmurs noted. Normal S1,S2.   Pulses: foot pulses are intact bilaterally.   MSK: no deformity of the feet or ankles.  CV: no edema of the legs or ankles Skin:  no ulcer on the feet or ankles.  normal color and temp on the feet and ankles Neuro: sensation is intact to touch on the feet and ankles.     Lab Results  Component Value  Date   HGBA1C 6.6 04/25/2017   I personally reviewed electrocardiogram tracing (today): Indication: HTN Impression: NSR.  No MI.  No hypertrophy. NS-STWA Compared to 2017: no significant change     Assessment & Plan:  Eye irritation, new, uncertain etiology.  Could be glaucoma Abnormal resting ecg: uncertain etiology Type 2 DM: he needs increased rx, if it can be done with a regimen that avoids or minimizes hypoglycemia. Dyslipidemia: zocor is stopped, due to interaction with norvasc.  HTN: he needs increased rx  Patient Instructions  blood tests are requested for you today.  We'll let you know about the results. Let's check an "echocardiogram" (easy and painless heart test).  you will receive a phone call, about a day and time for an appointment.  Please continue the same  medications for now.  i'll prescribe the cholesterol pill, based on the results.  I have sent a prescription to your pharmacy, for the diabetes.  Please come back for a regular physical appointment in 1 month.

## 2017-04-25 NOTE — Patient Instructions (Addendum)
blood tests are requested for you today.  We'll let you know about the results. Let's check an "echocardiogram" (easy and painless heart test).  you will receive a phone call, about a day and time for an appointment.  Please continue the same medications for now.  i'll prescribe the cholesterol pill, based on the results.  I have sent a prescription to your pharmacy, for the diabetes.  Please come back for a regular physical appointment in 1 month.

## 2017-04-25 NOTE — Telephone Encounter (Signed)
Patient called in reference to Rx for amLODipine (NORVASC) 10 MG tablet and metFORMIN (GLUCOPHAGE-XR) 500 MG 24 hr tablet needing to be sent to    [id number] - [place], [place] - [address] AT [address] [phone] (Phone) [phone] (Fax)   Please call patient and advise with any questions.

## 2017-04-26 ENCOUNTER — Other Ambulatory Visit: Payer: Self-pay

## 2017-04-26 MED ORDER — METFORMIN HCL ER 500 MG PO TB24: 500.0000 mg | ORAL_TABLET | Freq: Every day | ORAL | 3 refills | Status: DC

## 2017-04-26 MED ORDER — POTASSIUM CHLORIDE ER 10 MEQ PO TBCR: 10.0000 meq | EXTENDED_RELEASE_TABLET | Freq: Every day | ORAL | 11 refills | Status: DC

## 2017-04-26 MED ORDER — AMLODIPINE BESYLATE 10 MG PO TABS: 10.0000 mg | ORAL_TABLET | Freq: Every day | ORAL | 11 refills | Status: DC

## 2017-04-26 MED ORDER — ROSUVASTATIN CALCIUM 5 MG PO TABS: 5.0000 mg | ORAL_TABLET | Freq: Every day | ORAL | 11 refills | Status: DC

## 2017-05-01 ENCOUNTER — Ambulatory Visit (HOSPITAL_COMMUNITY): Payer: Medicare Other | Attending: Cardiology

## 2017-05-01 ENCOUNTER — Other Ambulatory Visit: Payer: Self-pay

## 2017-05-01 DIAGNOSIS — R9431 Abnormal electrocardiogram [ECG] [EKG]: Secondary | ICD-10-CM

## 2017-05-02 ENCOUNTER — Telehealth: Payer: Self-pay | Admitting: Cardiology

## 2017-05-02 ENCOUNTER — Telehealth: Payer: Self-pay | Admitting: Endocrinology

## 2017-05-02 ENCOUNTER — Telehealth: Payer: Self-pay

## 2017-05-02 NOTE — Telephone Encounter (Signed)
New Message      Pt was returning call, he was told to come back to the office , when he tried to call back it was after 5pm

## 2017-05-02 NOTE — Telephone Encounter (Signed)
Informed patient of echo results. Patient states he has been eating healthier over the last week, BP readings around 150/90's. Encouraged to continue taking antihypertensive, eating healthy diet and bring BP readings to next appt. Patient verbalized understanding.

## 2017-05-02 NOTE — Telephone Encounter (Signed)
Patient calling to clarify the suggestion for him to use doxycycline?  He's not sure if he ever got his cholesterol medicine as well.  Please advise,  Ty,  -LL

## 2017-05-02 NOTE — Telephone Encounter (Signed)
I am unaware of the doxycycline. I sent the rosuvastatin on 04/26/17 (cholesterol)

## 2017-05-02 NOTE — Telephone Encounter (Signed)
The documentation below came from Dr George HughEllison's note - maybe someone from their office called or possibly Rene KocherRegina to schedule an echo.  I have not called him.  Let's check an "echocardiogram" (easy and painless heart test).  you will receive a phone call, about a day and time for an appointment.

## 2017-05-02 NOTE — Telephone Encounter (Signed)
Called patient and he is taking the rouvastatin & clarified issue with doxycycline.

## 2017-05-02 NOTE — Telephone Encounter (Signed)
-----   Message from Romero BellingSean Ellison, MD sent at 05/01/2017  7:06 PM EDT ----- please call patient: Test shows an enlarged heart. There are 2 treatments: 1 is the blood pressure medication. The other is weight loss Please come back for a follow-up appointment in 1 month.

## 2017-05-24 ENCOUNTER — Ambulatory Visit
Admission: RE | Admit: 2017-05-24 | Discharge: 2017-05-24 | Disposition: A | Discharge status: Home / Self Care | Payer: Medicare Other | Source: Ambulatory Visit | Attending: Endocrinology | Admitting: Endocrinology

## 2017-05-24 ENCOUNTER — Ambulatory Visit (INDEPENDENT_AMBULATORY_CARE_PROVIDER_SITE_OTHER): Payer: Medicare Other | Admitting: Endocrinology

## 2017-05-24 ENCOUNTER — Encounter: Payer: Self-pay | Admitting: Endocrinology

## 2017-05-24 DIAGNOSIS — M549 Dorsalgia, unspecified: Secondary | ICD-10-CM | POA: Insufficient documentation

## 2017-05-24 DIAGNOSIS — M545 Low back pain, unspecified: Secondary | ICD-10-CM

## 2017-05-24 DIAGNOSIS — M47816 Spondylosis without myelopathy or radiculopathy, lumbar region: Secondary | ICD-10-CM | POA: Diagnosis not present

## 2017-05-24 IMAGING — CR DG LUMBAR SPINE COMPLETE 4+V
5 series · 5 of 5 positions shown · non-contrast
Comparison: None.

CLINICAL DATA: Left-sided back pain for 1 week, no known injury,
initial encounter

EXAM:
LUMBAR SPINE - COMPLETE 4+ VIEW

[t l-spine a.p.]
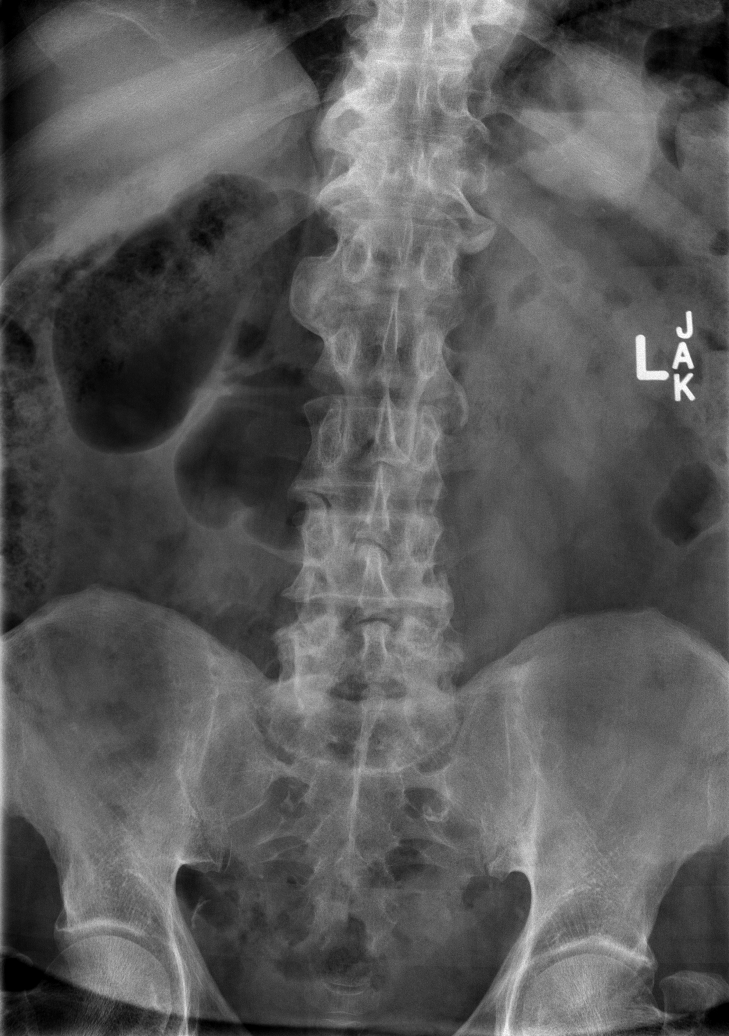

[t l-spine oblique exposure (1 of 2)]
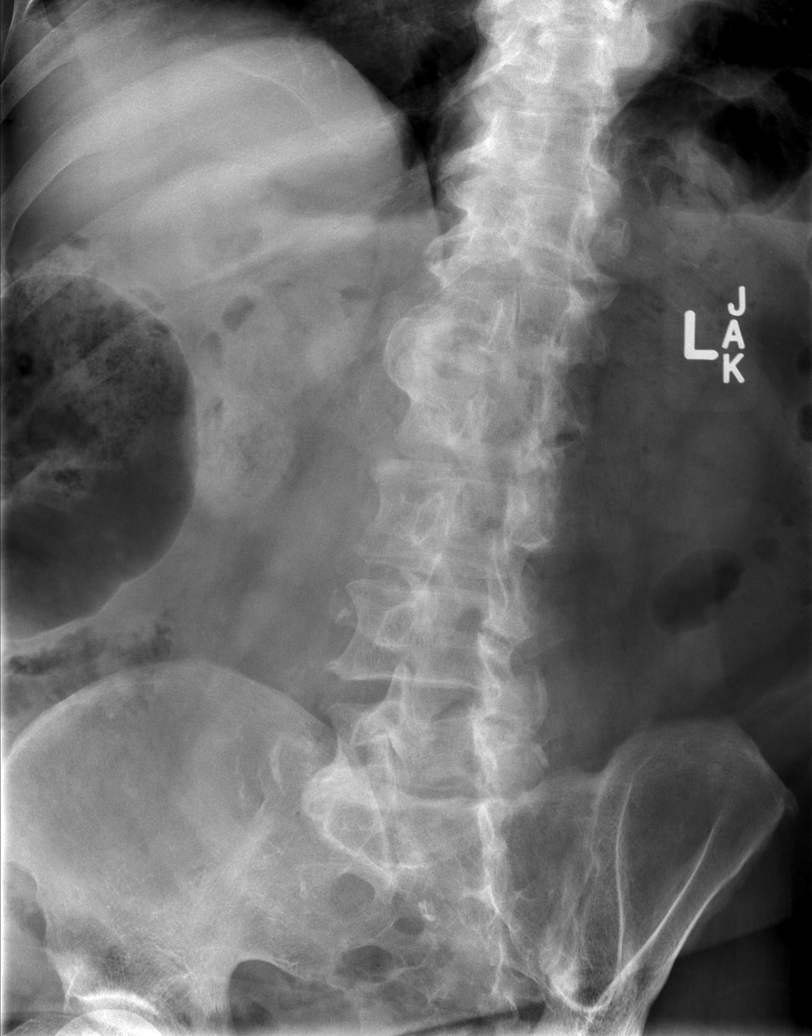

[t l-spine oblique exposure (2 of 2)]
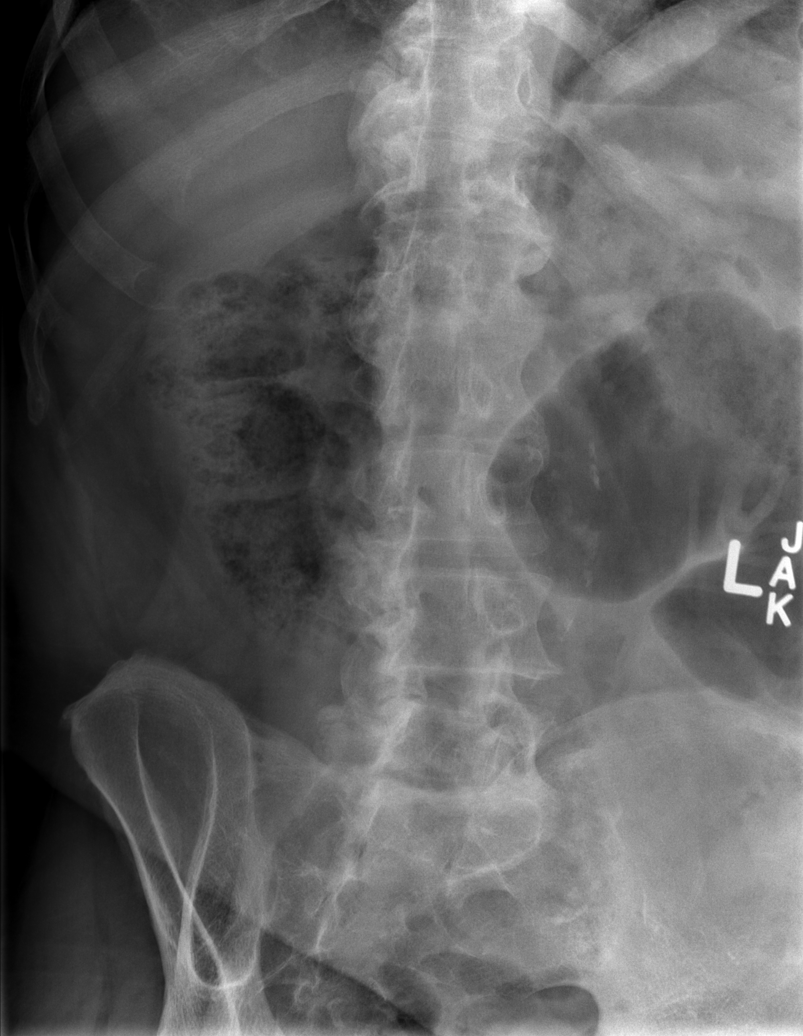

[t l-spine lat]
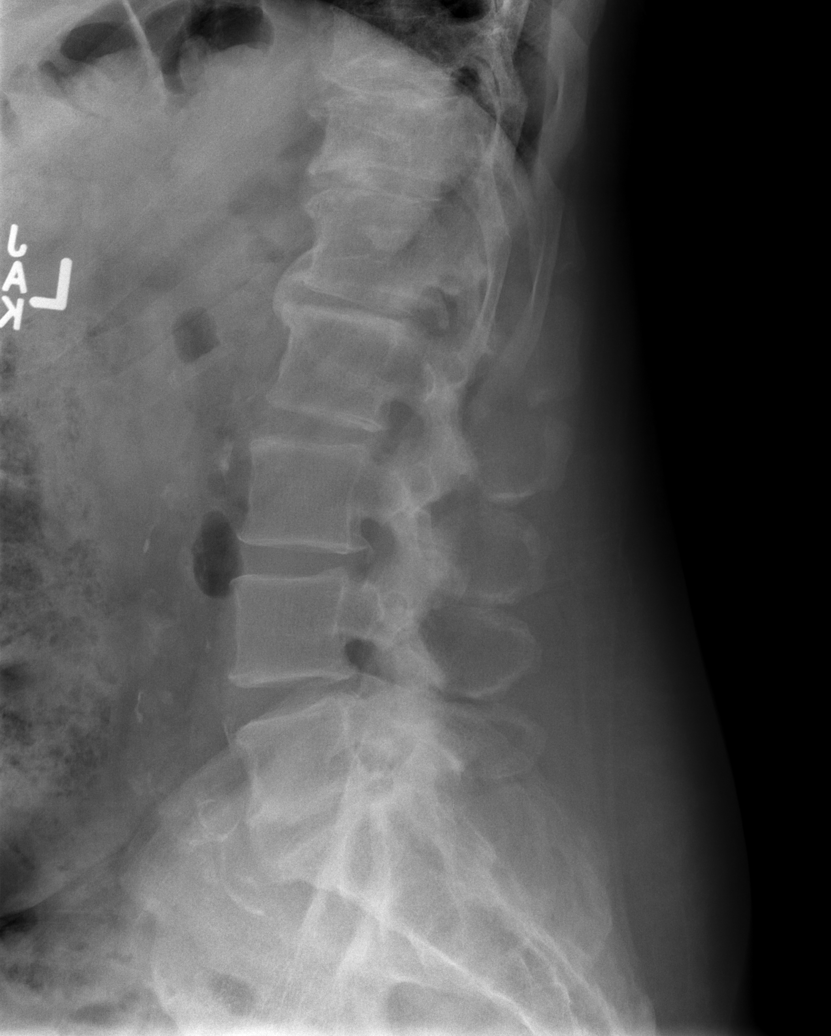

[t l-spine l5-s1 spot]
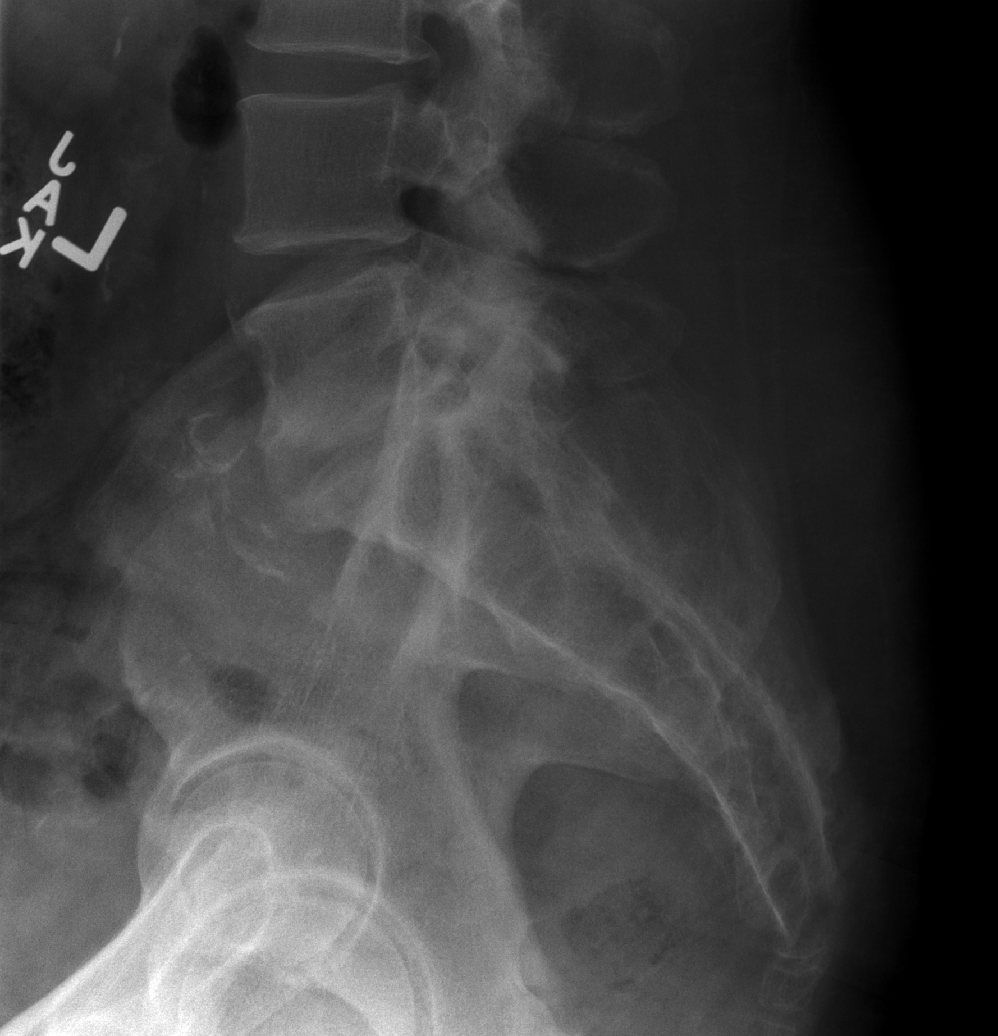

[5 of 5 positions shown; findings below may reference images not displayed]

FINDINGS: Five lumbar type vertebral bodies are well visualized. Vertebral
body height is well maintained. Mild osteophytic changes are noted.
Facet hypertrophic changes are noted without anterolisthesis. Disc
space narrowing is noted at L5-S1. Mild aortic calcifications are
seen without aneurysmal dilatation.
IMPRESSION: Degenerative change without acute abnormality.

## 2017-05-24 MED ORDER — AMLODIPINE BESYLATE 10 MG PO TABS: 20.0000 mg | ORAL_TABLET | Freq: Every day | ORAL | 11 refills | Status: DC

## 2017-05-24 MED ORDER — POTASSIUM CHLORIDE ER 8 MEQ PO TBCR: 16.0000 meq | EXTENDED_RELEASE_TABLET | Freq: Every day | ORAL | 11 refills | Status: DC

## 2017-05-24 NOTE — Progress Notes (Signed)
Subjective:    Patient ID: Roberto Rivera, male    DOB: 12-Nov-1950, 66 y.o.   MRN: [id number]  HPI Pt is here for regular wellness examination, and is feeling pretty well in general, and says chronic med probs are stable, except as noted below Past Medical History:  Diagnosis Date  . ALLERGIC RHINITIS 04/01/2007  . Diabetes mellitus   . DIABETES MELLITUS, TYPE II 04/01/2007  . 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 Hypercholesteremia   . HYPERCHOLESTEROLEMIA 02/07/2008  . Hypertension   . HYPERTENSION 04/01/2007  . MICROCYTOSIS 02/07/2008  . OSTEOARTHRITIS, SPINE 02/07/2008  . THYROID CYST 02/07/2008    No past surgical history on file.  Social History   Social History  . Marital status: Married    Spouse name: N/A  . Number of children: N/A  . Years of education: N/A   Occupational History  . [employer]ile [employer]   Social History Main Topics  . Smoking status: Current Every Day Smoker    Packs/day: 1.00    Types: Cigarettes  . Smokeless tobacco: Never Used  . Alcohol use No  . Drug use: No  . Sexual activity: Not on file   Other Topics Concern  . Not on file   Social History Narrative   Divorced x many years    Current Outpatient Prescriptions on File Prior to Visit  Medication Sig Dispense Refill  . clotrimazole-betamethasone (LOTRISONE) cream Apply 1 application topically 2 (two) times daily. 30 g 0  . glucose blood (ONE TOUCH ULTRA TEST) test strip Use as instructed once daily and lancets 250.00    . losartan-hydrochlorothiazide (HYZAAR) 100-25 MG tablet TAKE 1 TABLET BY MOUTH DAILY 90 tablet 0  . metFORMIN (GLUCOPHAGE-XR) 500 MG 24 hr tablet Take 1 tablet (500 mg total) by mouth daily with breakfast. 90 tablet 3  . metoprolol succinate (TOPROL-XL) 50 MG 24 hr tablet TAKE ONE-HALF TABLET BY MOUTH EVERY DAY 45 tablet 0  . rosuvastatin (CRESTOR) 5 MG tablet Take 1 tablet (5 mg total) by mouth daily. 30 tablet 11  . tadalafil (CIALIS) 20 MG tablet Take 20 mg by  mouth daily as needed for erectile dysfunction.     No current facility-administered medications on file prior to visit.     No Known Allergies  Family History  Problem Relation Age of Onset  . Cancer Mother        had uncertain type of cancer    BP (!) 142/88   Pulse 75   Wt 231 lb 3.2 oz (104.9 kg)   SpO2 97%   BMI 34.14 kg/m    Review of Systems Denies fever, fatigue, visual loss, hearing loss, chest pain, sob, leg cramps depression, cold intolerance, BRBPR, syncope, numbness, allergy sxs, and easy bruising.  He says he has lost a few lbs.       Objective:   Physical Exam VS: see vs page GEN: no distress HEAD: head: no deformity eyes: no periorbital swelling, no proptosis external nose and ears are normal mouth: no lesion seen NECK: supple, thyroid is not enlarged CHEST WALL: no deformity LUNGS: clear to auscultation BREASTS:  No gynecomastia CV: reg rate and rhythm, no murmur ABD: abdomen is soft, nontender.  no hepatosplenomegaly.  not distended.  no hernia RECTAL/PROSTATE: declines MUSCULOSKELETAL: muscle bulk and strength are grossly normal.  no obvious joint swelling.  gait is normal and steady EXTEMITIES: no deformity.  no ulcer on the feet.  feet are of normal color and temp.  Trace  bilat leg edema.  There is bilateral onychomycosis of the toenails.  PULSES: dorsalis pedis intact bilat.  no carotid bruit NEURO:  cn 2-12 grossly intact.   readily moves all 4's.  sensation is intact to touch on the feet.  SKIN:  Normal texture and temperature.  No rash or suspicious lesion is visible.   NODES:  None palpable at the neck.  PSYCH: alert, well-oriented.  Does not appear anxious nor depressed.      Assessment & Plan:  Wellness visit today, with problems stable, except as noted.  He declines vaccinations.     SEPARATE EVALUATION FOLLOWS--EACH PROBLEM HERE IS NEW, NOT RESPONDING TO TREATMENT, OR POSES SIGNIFICANT RISK TO THE PATIENT'S HEALTH:  HISTORY OF THE  PRESENT ILLNESS: Pt states 1 week of moderate pain at the lower back, but no assoc numbness.   PAST MEDICAL HISTORY Past Medical History:  Diagnosis Date  . ALLERGIC RHINITIS 04/01/2007  . Diabetes mellitus   . DIABETES MELLITUS, TYPE II 04/01/2007  . 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 Hypercholesteremia   . HYPERCHOLESTEROLEMIA 02/07/2008  . Hypertension   . HYPERTENSION 04/01/2007  . MICROCYTOSIS 02/07/2008  . OSTEOARTHRITIS, SPINE 02/07/2008  . THYROID CYST 02/07/2008    No past surgical history on file.  Social History   Social History  . Marital status: Married    Spouse name: N/A  . Number of children: N/A  . Years of education: N/A   Occupational History  . [employer]   Social History Main Topics  . Smoking status: Current Every Day Smoker    Packs/day: 1.00    Types: Cigarettes  . Smokeless tobacco: Never Used  . Alcohol use No  . Drug use: No  . Sexual activity: Not on file   Other Topics Concern  . Not on file   Social History Narrative   Divorced x many years    Current Outpatient Prescriptions on File Prior to Visit  Medication Sig Dispense Refill  . clotrimazole-betamethasone (LOTRISONE) cream Apply 1 application topically 2 (two) times daily. 30 g 0  . glucose blood (ONE TOUCH ULTRA TEST) test strip Use as instructed once daily and lancets 250.00    . losartan-hydrochlorothiazide (HYZAAR) 100-25 MG tablet TAKE 1 TABLET BY MOUTH DAILY 90 tablet 0  . metFORMIN (GLUCOPHAGE-XR) 500 MG 24 hr tablet Take 1 tablet (500 mg total) by mouth daily with breakfast. 90 tablet 3  . metoprolol succinate (TOPROL-XL) 50 MG 24 hr tablet TAKE ONE-HALF TABLET BY MOUTH EVERY DAY 45 tablet 0  . rosuvastatin (CRESTOR) 5 MG tablet Take 1 tablet (5 mg total) by mouth daily. 30 tablet 11  . tadalafil (CIALIS) 20 MG tablet Take 20 mg by mouth daily as needed for erectile dysfunction.     No current facility-administered medications on file prior to visit.      No Known Allergies  Family History  Problem Relation Age of Onset  . Cancer Mother        had uncertain type of cancer    BP (!) 142/88   Pulse 75   Wt 231 lb 3.2 oz (104.9 kg)   SpO2 97%   BMI 34.14 kg/m   REVIEW OF SYSTEMS: Denies hematuria and rash PHYSICAL EXAMINATION: VITAL SIGNS:  See vs page.  GENERAL: no distress.  Spine: nontender IMPRESSION: Back pain, new, uncertain etiology HTN: poss exac by pain.  Edema: if this gets worse, we'll have to re-reduce norvasc.  Hypokalemia: we may need to add lasix,  but we need this better first PLAN:  X-rays are requested for you today.  We'll let you know about the results. I have sent 2 prescriptions to your pharmacy: to double the amlodipine, and to change the potassium to a different type of pill.

## 2017-05-24 NOTE — Patient Instructions (Addendum)
Please consider these measures for your health:  minimize alcohol.  Do not use tobacco products.  Have a colonoscopy at least every 10 years from age 66.  Keep firearms safely stored.  Always use seat belts.  have working smoke alarms in your home.  See an eye doctor and dentist regularly.  Never drive under the influence of alcohol or drugs (including prescription drugs).   X-rays are requested for you today.  We'll let you know about the results. I have sent 2 prescriptions to your pharmacy: to double the amlodipine, and to change the potassium to a different type of pill.   Please come back for a follow-up appointment in 1 month.

## 2017-05-30 DIAGNOSIS — E119 Type 2 diabetes mellitus without complications: Secondary | ICD-10-CM | POA: Diagnosis not present

## 2017-05-30 DIAGNOSIS — H524 Presbyopia: Secondary | ICD-10-CM | POA: Diagnosis not present

## 2017-05-30 DIAGNOSIS — H25013 Cortical age-related cataract, bilateral: Secondary | ICD-10-CM | POA: Diagnosis not present

## 2017-05-30 LAB — HM DIABETES EYE EXAM

## 2017-06-26 ENCOUNTER — Ambulatory Visit (INDEPENDENT_AMBULATORY_CARE_PROVIDER_SITE_OTHER): Payer: Medicare Other | Admitting: Endocrinology

## 2017-06-26 ENCOUNTER — Encounter: Payer: Self-pay | Admitting: Endocrinology

## 2017-06-26 VITALS — BP 122/78 | HR 71 | Wt 231.0 lb

## 2017-06-26 DIAGNOSIS — I10 Essential (primary) hypertension: Secondary | ICD-10-CM

## 2017-06-26 DIAGNOSIS — Z125 Encounter for screening for malignant neoplasm of prostate: Secondary | ICD-10-CM

## 2017-06-26 DIAGNOSIS — Z0000 Encounter for general adult medical examination without abnormal findings: Secondary | ICD-10-CM | POA: Diagnosis not present

## 2017-06-26 DIAGNOSIS — E876 Hypokalemia: Secondary | ICD-10-CM

## 2017-06-26 DIAGNOSIS — E1121 Type 2 diabetes mellitus with diabetic nephropathy: Secondary | ICD-10-CM

## 2017-06-26 DIAGNOSIS — R229 Localized swelling, mass and lump, unspecified: Secondary | ICD-10-CM | POA: Diagnosis not present

## 2017-06-26 DIAGNOSIS — Z119 Encounter for screening for infectious and parasitic diseases, unspecified: Secondary | ICD-10-CM

## 2017-06-26 DIAGNOSIS — E7800 Pure hypercholesterolemia, unspecified: Secondary | ICD-10-CM | POA: Diagnosis not present

## 2017-06-26 LAB — BASIC METABOLIC PANEL
BUN: 19 mg/dL (ref 6–23)
CHLORIDE: 102 meq/L (ref 96–112)
CO2: 30 meq/L (ref 19–32)
CREATININE: 1.59 mg/dL — AB (ref 0.40–1.50)
Calcium: 10.1 mg/dL (ref 8.4–10.5)
GFR: 56.35 mL/min — ABNORMAL LOW (ref >60.00)
Glucose, Bld: 107 mg/dL — ABNORMAL HIGH (ref 70–99)
Potassium: 3.7 mEq/L (ref 3.5–5.1)
Sodium: 138 mEq/L (ref 135–145)

## 2017-06-26 LAB — POCT GLYCOSYLATED HEMOGLOBIN (HGB A1C): Hemoglobin A1C: 6.3

## 2017-06-26 MED ORDER — CLOTRIMAZOLE-BETAMETHASONE 1-0.05 % EX CREA: 1.0000 "application " | TOPICAL_CREAM | Freq: Two times a day (BID) | CUTANEOUS | 5 refills | Status: DC

## 2017-06-26 NOTE — Patient Instructions (Addendum)
Please continue the same medications blood tests are requested for you today.  We'll let you know about the results. Please come back for a follow-up appointment in 6 months.  

## 2017-06-26 NOTE — Progress Notes (Signed)
Subjective:    Patient ID: Roberto Rivera, male    DOB: 1951-08-15, 66 y.o.   MRN: [id number]  HPI  The state of at least three ongoing medical problems is addressed today, with interval history of each noted here: Pt returns for f/u of diabetes mellitus: DM type: 2 Dx'ed: 2011 Complications: none Therapy: none DKA: never Severe hypoglycemia: never Pancreatitis: never Other: he did not tolerate pioglitizone (edema).   Interval history: he has lost a few lbs. Meter is downloaded today, and the printout is scanned into the record.  It varies from 80-120.  There is no trend throughout the day.  This is a stable problem.  HTN: denies chest pain.  This is a stable problem. Edema: he says this does not bother him.  He denies sob. This is a stable problem.  Hypokalemia: denies leg cramps.  This is a stable problem. Past Medical History:  Diagnosis Date  . ALLERGIC RHINITIS 04/01/2007  . Diabetes mellitus   . DIABETES MELLITUS, TYPE II 04/01/2007  . 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 Hypercholesteremia   . HYPERCHOLESTEROLEMIA 02/07/2008  . Hypertension   . HYPERTENSION 04/01/2007  . MICROCYTOSIS 02/07/2008  . OSTEOARTHRITIS, SPINE 02/07/2008  . THYROID CYST 02/07/2008    History reviewed. No pertinent surgical history.  Social History   Socioeconomic History  . Marital status: Married    Spouse name: Not on file  . Number of children: Not on file  . Years of education: Not on file  . Highest education level: Not on file  Social Needs  . Financial resource strain: Not on file  . Food insecurity - worry: Not on file  . Food insecurity - inability: Not on file  . Transportation needs - medical: Not on file  . Transportation needs - non-medical: Not on file  Occupational History  . Occupation: Theatre stage managerTextile    Employer: [employer]  Tobacco Use  . Smoking status: Current Every Day Smoker    Packs/day: 1.00    Types: Cigarettes  . Smokeless tobacco: Never Used  Substance and  Sexual Activity  . Alcohol use: No    Alcohol/week: 0.0 oz  . Drug use: No  . Sexual activity: Not on file  Other Topics Concern  . Not on file  Social History Narrative   Divorced x many years    Current Outpatient Medications on File Prior to Visit  Medication Sig Dispense Refill  . amLODipine (NORVASC) 10 MG tablet Take 2 tablets (20 mg total) by mouth daily. 60 tablet 11  . glucose blood (ONE TOUCH ULTRA TEST) test strip Use as instructed once daily and lancets 250.00    . losartan-hydrochlorothiazide (HYZAAR) 100-25 MG tablet TAKE 1 TABLET BY MOUTH DAILY 90 tablet 0  . metFORMIN (GLUCOPHAGE-XR) 500 MG 24 hr tablet Take 1 tablet (500 mg total) by mouth daily with breakfast. 90 tablet 3  . metoprolol succinate (TOPROL-XL) 50 MG 24 hr tablet TAKE ONE-HALF TABLET BY MOUTH EVERY DAY 45 tablet 0  . potassium chloride (KLOR-CON) 8 MEQ tablet Take 2 tablets (16 mEq total) by mouth daily. 60 tablet 11  . rosuvastatin (CRESTOR) 5 MG tablet Take 1 tablet (5 mg total) by mouth daily. 30 tablet 11  . tadalafil (CIALIS) 20 MG tablet Take 20 mg by mouth daily as needed for erectile dysfunction.     No current facility-administered medications on file prior to visit.     No Known Allergies  Family History  Problem Relation Age of Onset  .  Cancer Mother        had uncertain type of cancer    BP 122/78 (BP Location: Left Arm, Patient Position: Sitting, Cuff Size: Normal)   Pulse 71   Wt 231 lb (104.8 kg)   SpO2 98%   BMI 34.11 kg/m    Review of Systems He has slight cold intolerance.      Objective:   Physical Exam VITAL SIGNS:  See vs page GENERAL: no distress Ext: 1+ bilat leg edema.  A1c=6.3%    Assessment & Plan:  Type 2 DM: well-controlled HTN: well-controlled Edema: unchanged.  Pt declines to adjust meds for this. Hypokalemia: due for recheck  Patient Instructions  Please continue the same medications.  blood tests are requested for you today.  We'll let you know  about the results. Please come back for a follow-up appointment in 6 months.

## 2017-07-01 ENCOUNTER — Other Ambulatory Visit: Payer: Self-pay | Admitting: Endocrinology

## 2017-09-28 ENCOUNTER — Other Ambulatory Visit: Payer: Self-pay | Admitting: Endocrinology

## 2017-12-25 ENCOUNTER — Encounter: Payer: Self-pay | Admitting: Endocrinology

## 2017-12-25 ENCOUNTER — Ambulatory Visit (INDEPENDENT_AMBULATORY_CARE_PROVIDER_SITE_OTHER): Payer: Medicare Other | Admitting: Endocrinology

## 2017-12-25 VITALS — BP 122/82 | HR 80 | Wt 235.6 lb

## 2017-12-25 DIAGNOSIS — E1121 Type 2 diabetes mellitus with diabetic nephropathy: Secondary | ICD-10-CM | POA: Diagnosis not present

## 2017-12-25 DIAGNOSIS — R229 Localized swelling, mass and lump, unspecified: Secondary | ICD-10-CM

## 2017-12-25 DIAGNOSIS — Z23 Encounter for immunization: Secondary | ICD-10-CM | POA: Diagnosis not present

## 2017-12-25 DIAGNOSIS — Z0000 Encounter for general adult medical examination without abnormal findings: Secondary | ICD-10-CM | POA: Diagnosis not present

## 2017-12-25 LAB — BASIC METABOLIC PANEL
BUN: 21 mg/dL (ref 6–23)
CALCIUM: 9.6 mg/dL (ref 8.4–10.5)
CHLORIDE: 103 meq/L (ref 96–112)
CO2: 26 mEq/L (ref 19–32)
CREATININE: 1.55 mg/dL — AB (ref 0.40–1.50)
GFR: 57.95 mL/min — ABNORMAL LOW (ref >60.00)
Glucose, Bld: 118 mg/dL — ABNORMAL HIGH (ref 70–99)
Potassium: 3.5 mEq/L (ref 3.5–5.1)
Sodium: 138 mEq/L (ref 135–145)

## 2017-12-25 LAB — POCT GLYCOSYLATED HEMOGLOBIN (HGB A1C): HEMOGLOBIN A1C: 6.4

## 2017-12-25 MED ORDER — OLMESARTAN MEDOXOMIL-HCTZ 40-25 MG PO TABS: 1.0000 | ORAL_TABLET | Freq: Every day | ORAL | 3 refills | Status: DC

## 2017-12-25 NOTE — Patient Instructions (Signed)
Please continue the same medications. blood tests are requested for you today.  We'll let you know about the results. Please consider these measures for your health:  minimize alcohol.  Do not use tobacco products.  Have a colonoscopy at least every 10 years from age 67.  Keep firearms safely stored.  Always use seat belts.  have working smoke alarms in your home.  See an eye doctor and dentist regularly.  Never drive under the influence of alcohol or drugs (including prescription drugs).   please let me know what your wishes would be, if artificial life support measures should become necessary.  It is critically important to prevent falling down (keep floor areas well-lit, dry, and free of loose objects.  If you have a cane, walker, or wheelchair, you should use it, even for short trips around the house.  Wear flat-soled shoes.  Also, try not to rush) Please come back for a follow-up appointment in 6 months (after 06/26/18).

## 2017-12-25 NOTE — Progress Notes (Signed)
we discussed code status.  pt requests full code, but would not want to be started or maintained on artificial life-support measures if there was not a reasonable chance of recovery 

## 2017-12-25 NOTE — Progress Notes (Signed)
Subjective:    Patient ID: Roberto Rivera, male    DOB: [birth year], 67 y.o.   MRN: [id number]  HPI The state of at least three ongoing medical problems is addressed today, with interval history of each noted here: Pt returns for f/u of diabetes mellitus: DM type: 2 Dx'ed: 2011 Complications: none Therapy: none DKA: never Severe hypoglycemia: never Pancreatitis: never Other: he did not tolerate pioglitizone (edema).   Interval history: he has lost a few lbs. Meter is downloaded today, and the printout is scanned into the record.  Both are approx 100.  There is no trend throughout the day.   Past Medical History:  Diagnosis Date  . ALLERGIC RHINITIS 04/01/2007  . Diabetes mellitus   . DIABETES MELLITUS, TYPE II 04/01/2007  . 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 Hypercholesteremia   . HYPERCHOLESTEROLEMIA 02/07/2008  . Hypertension   . HYPERTENSION 04/01/2007  . MICROCYTOSIS 02/07/2008  . OSTEOARTHRITIS, SPINE 02/07/2008  . THYROID CYST 02/07/2008    No past surgical history on file.  Social History   Socioeconomic History  . Marital status: Married    Spouse name: Not on file  . Number of children: Not on file  . Years of education: Not on file  . Highest education level: Not on file  Occupational History  . Occupation: Theatre stage manager: [employer]  Social Needs  . Financial resource strain: Not on file  . Food insecurity:    Worry: Not on file    Inability: Not on file  . Transportation needs:    Medical: Not on file    Non-medical: Not on file  Tobacco Use  . Smoking status: Current Every Day Smoker    Packs/day: 1.00    Types: Cigarettes  . Smokeless tobacco: Never Used  Substance and Sexual Activity  . Alcohol use: No    Alcohol/week: 0.0 oz  . Drug use: No  . Sexual activity: Not on file  Lifestyle  . Physical activity:    Days per week: Not on file    Minutes per session: Not on file  . Stress: Not on file  Relationships  . Social  connections:    Talks on phone: Not on file    Gets together: Not on file    Attends religious service: Not on file    Active member of club or organization: Not on file    Attends meetings of clubs or organizations: Not on file    Relationship status: Not on file  . Intimate partner violence:    Fear of current or ex partner: Not on file    Emotionally abused: Not on file    Physically abused: Not on file    Forced sexual activity: Not on file  Other Topics Concern  . Not on file  Social History Narrative   Divorced x many years    Current Outpatient Medications on File Prior to Visit  Medication Sig Dispense Refill  . amLODipine (NORVASC) 10 MG tablet Take 2 tablets (20 mg total) by mouth daily. 60 tablet 11  . clotrimazole-betamethasone (LOTRISONE) cream Apply 1 application 2 (two) times daily topically. As needed for foot itching 90 g 5  . glucose blood (ONE TOUCH ULTRA TEST) test strip Use as instructed once daily and lancets 250.00    . metFORMIN (GLUCOPHAGE-XR) 500 MG 24 hr tablet Take 1 tablet (500 mg total) by mouth daily with breakfast. 90 tablet 3  . metoprolol succinate (TOPROL-XL) 50 MG 24 hr  tablet TAKE ONE-HALF TABLET BY MOUTH EVERY DAY 45 tablet 0  . potassium chloride (KLOR-CON) 8 MEQ tablet Take 2 tablets (16 mEq total) by mouth daily. 60 tablet 11  . rosuvastatin (CRESTOR) 5 MG tablet Take 1 tablet (5 mg total) by mouth daily. 30 tablet 11  . tadalafil (CIALIS) 20 MG tablet Take 20 mg by mouth daily as needed for erectile dysfunction.     No current facility-administered medications on file prior to visit.     No Known Allergies  Family History  Problem Relation Age of Onset  . Cancer Mother        had uncertain type of cancer    BP 122/82   Pulse 80   Wt 235 lb 9.6 oz (106.9 kg)   SpO2 97%   BMI 34.79 kg/m    Review of Systems He denies hypoglycemia    Objective:   Physical Exam VITAL SIGNS:  See vs page GENERAL: no distress Pulses: dorsalis  pedis intact bilat.   MSK: no deformity of the feet CV: no leg edema Skin:  no ulcer on the feet.  normal color and temp on the feet. Neuro: sensation is intact to touch on the feet Ext: There is bilateral onychomycosis of the toenails.    Lab Results  Component Value Date   CREATININE 1.59 (H) 06/26/2017   BUN 19 06/26/2017   NA 138 06/26/2017   K 3.7 06/26/2017   CL 102 06/26/2017   CO2 30 06/26/2017   Lab Results  Component Value Date   HGBA1C 6.4 12/25/2017       Assessment & Plan:  Type 2 DM: well-controlled HTN: well-controlled Please continue the same medications   Subjective:   Patient here for Medicare annual wellness visit and management of other chronic and acute problems.     Risk factors: advanced age    Roster of Physicians Providing Medical Care to Patient:  See "snapshot"   Activities of Daily Living: In your present state of health, do you have any difficulty performing the following activities (lives with GF of 30 years)?:  Preparing food and eating?: No  Bathing yourself: No  Getting dressed: No  Using the toilet:No  Moving around from place to place: No  In the past year have you fallen or had a near fall?: No    Home Safety: Has smoke detector and wears seat belts. No firearms.   Opioid Use: none   Diet and Exercise  Current exercise habits: pt says good Dietary issues discussed: pt reports a healthy diet   Depression Screen  Q1: Over the past two weeks, have you felt down, depressed or hopeless? no  Q2: Over the past two weeks, have you felt little interest or pleasure in doing things? no   The following portions of the patient's history were reviewed and updated as appropriate: allergies, current medications, past family history, past medical history, past social history, past surgical history and problem list.    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   Hearing: grossly normal.  Body mass index:  See vs page Msk: pt easily and quickly performs "get-up-and-go" from a sitting position Cognitive Impairment Assessment: cognition, memory and judgment appear normal.  remembers 3/3 at 5 minutes.  excellent recall.  can easily read and write a sentence.  alert and oriented x 3.     Assessment:   Medicare wellness utd  on preventive parameters    Plan:   During the course of the visit the patient was educated and counseled about appropriate screening and preventive services including:        Fall prevention is advised today  Diabetes screening is UTD Nutrition counseling is offered  advanced directives/end of life addressed today:  see healthcare directives hyperlink  Vaccines are updated as needed  Patient Instructions (the written plan) was given to the patient.

## 2017-12-28 ENCOUNTER — Other Ambulatory Visit: Payer: Self-pay | Admitting: Endocrinology

## 2018-01-16 ENCOUNTER — Telehealth: Payer: Self-pay | Admitting: Endocrinology

## 2018-01-16 NOTE — Telephone Encounter (Signed)
olmesartan-hydrochlorothiazide (BENICAR HCT) 40-25 MG tablet   Patient called and stated that he is having pain in his stomach and back that he believes is due to the new medication that he is on. He would like to know if the doctor would like him to continue this medication   Please advise

## 2018-01-16 NOTE — Telephone Encounter (Signed)
Please try reducing to 1/2 pill per day.  Please call or message [place] next week, to tell us how you are feeling

## 2018-01-17 ENCOUNTER — Telehealth: Payer: Self-pay | Admitting: Endocrinology

## 2018-01-17 ENCOUNTER — Encounter (HOSPITAL_COMMUNITY): Payer: Self-pay | Admitting: Emergency Medicine

## 2018-01-17 ENCOUNTER — Other Ambulatory Visit: Payer: Self-pay

## 2018-01-17 ENCOUNTER — Ambulatory Visit (HOSPITAL_COMMUNITY)
Admission: EM | Admit: 2018-01-17 | Discharge: 2018-01-17 | Disposition: A | Discharge status: Home / Self Care | Payer: Medicare Other | Attending: Family Medicine | Admitting: Family Medicine

## 2018-01-17 DIAGNOSIS — K5901 Slow transit constipation: Secondary | ICD-10-CM

## 2018-01-17 DIAGNOSIS — M545 Low back pain, unspecified: Secondary | ICD-10-CM

## 2018-01-17 LAB — POCT URINALYSIS DIP (DEVICE)
BILIRUBIN URINE: NEGATIVE
GLUCOSE, UA: NEGATIVE mg/dL
Ketones, ur: NEGATIVE mg/dL
LEUKOCYTES UA: NEGATIVE
NITRITE: NEGATIVE
Protein, ur: 30 mg/dL — AB
Specific Gravity, Urine: 1.015 (ref 1.005–1.030)
Urobilinogen, UA: 0.2 mg/dL (ref 0.0–1.0)
pH: 7 (ref 5.0–8.0)

## 2018-01-17 MED ORDER — CYCLOBENZAPRINE HCL 10 MG PO TABS: 5.0000 mg | ORAL_TABLET | Freq: Three times a day (TID) | ORAL | 0 refills | Status: DC | PRN

## 2018-01-17 MED ORDER — TRAMADOL HCL 50 MG PO TABS: 25.0000 mg | ORAL_TABLET | Freq: Four times a day (QID) | ORAL | 0 refills | Status: DC | PRN

## 2018-01-17 NOTE — Telephone Encounter (Signed)
Per [facility] states patient has new prescription (Olmesartan Medox) that he is having side effects from. He is having back and stomach pain.  Nurse Assessment Corrie Dandy Noe)-Pt states he is taking a new BP medication Olmesartan and he is having back and stomach pain since starting it

## 2018-01-17 NOTE — ED Provider Notes (Signed)
01/17/2018 12:05 PM   DOB: 12-18-1950 / MRN: [id number]  SUBJECTIVE:  Roberto Rivera is a 67 y.o. male presenting for left-sided back pain.  He has a chronic history of this but says that is been worse over the last 3 to 4 days.  Going from sitting to standing and vice versa makes the pain worse.  Tends to loosen up after taking a few steps.  He describes the pain is an ache.  He is taken 400 mg of ibuprofen last night with some mild relief.  He associates some constipation however was able to move his bowels normally after taking MiraLAX 3 days ago which was his last bowel movement.  He denies blood in the stool.  He is eating well has a normal appetite.  Denies any dysuria, urgency, frequency, hematuria.  He denies fever, chills, nausea.  He has No Known Allergies.   He  has a past medical history of ALLERGIC RHINITIS (04/01/2007), Diabetes mellitus, DIABETES MELLITUS, TYPE II (04/01/2007), ERECTILE DYSFUNCTION, ORGANIC (02/07/2008), Hypercholesteremia, HYPERCHOLESTEROLEMIA (02/07/2008), Hypertension, HYPERTENSION (04/01/2007), MICROCYTOSIS (02/07/2008), OSTEOARTHRITIS, SPINE (02/07/2008), and THYROID CYST (02/07/2008).    He  reports that he has been smoking cigarettes.  He has been smoking about 1.00 pack per day. He has never used smokeless tobacco. He reports that he does not drink alcohol or use drugs. He  has no sexual activity history on file. The patient  has no past surgical history on file.  His family history includes Cancer in his mother.  ROS per HPI  OBJECTIVE:  BP 124/70 (BP Location: Left Arm)   Pulse 82   Temp 98.4 F (36.9 C) (Oral)   SpO2 99%   Wt Readings from Last 3 Encounters:  12/25/17 235 lb 9.6 oz (106.9 kg)  06/26/17 231 lb (104.8 kg)  05/24/17 231 lb 3.2 oz (104.9 kg)   Temp Readings from Last 3 Encounters:  01/17/18 98.4 F (36.9 C) (Oral)  04/23/17 98.6 F (37 C) (Oral)  03/10/16 98.1 F (36.7 C) (Oral)   BP Readings from Last 3 Encounters:  01/17/18 124/70   12/25/17 122/82  06/26/17 122/78   Pulse Readings from Last 3 Encounters:  01/17/18 82  12/25/17 80  06/26/17 71    Physical Exam  Constitutional: He is oriented to person, place, and time. He appears well-developed. He does not appear ill.  Eyes: Pupils are equal, round, and reactive to light. Conjunctivae and EOM are normal.  Cardiovascular: Normal rate, regular rhythm, S1 normal, S2 normal, normal heart sounds, intact distal pulses and normal pulses. Exam reveals no gallop and no friction rub.  No murmur heard. Pulmonary/Chest: Effort normal. No stridor. No respiratory distress. He has no wheezes. He has no rales.  Abdominal: He exhibits no distension. There is no rigidity, no rebound, no guarding, no CVA tenderness, no tenderness at McBurney's point and negative Murphy's sign. No hernia. Hernia confirmed negative in the ventral area, confirmed negative in the right inguinal area and confirmed negative in the left inguinal area.  Musculoskeletal: Normal range of motion. He exhibits no edema.  Neurological: He is alert and oriented to person, place, and time. No cranial nerve deficit. Coordination normal.  Skin: Skin is warm and dry. He is not diaphoretic.  Psychiatric: He has a normal mood and affect.  Nursing note and vitals reviewed.   Results for orders placed or performed during the hospital encounter of 01/17/18 (from the past 72 hour(s))  POCT urinalysis dip (device)     Status: Abnormal  Collection Time: 01/17/18 11:39 AM  Result Value Ref Range   Glucose, UA NEGATIVE NEGATIVE mg/dL   Bilirubin Urine NEGATIVE NEGATIVE   Ketones, ur NEGATIVE NEGATIVE mg/dL   Specific Gravity, Urine 1.015 1.005 - 1.030   Hgb urine dipstick TRACE (A) NEGATIVE   pH 7.0 5.0 - 8.0   Protein, ur 30 (A) NEGATIVE mg/dL   Urobilinogen, UA 0.2 0.0 - 1.0 mg/dL   Nitrite NEGATIVE NEGATIVE   Leukocytes, UA NEGATIVE NEGATIVE    Comment: Biochemical Testing Only. Please order routine urinalysis from  main lab if confirmatory testing is needed.    Lab Results  Component Value Date   CREATININE 1.55 (H) 12/25/2017    No results found.  ASSESSMENT AND PLAN:   Slow transit constipation: Miralax per AVS.   Acute left-sided low back pain without sciatica:Muscle relaxor and tramadol for breakthrough pain as patient has a history of mild CKD making NSAIDS less attractive.     Discharge Instructions     For constipation   Make sure you are drinking enough water daily. Make sure you are getting enough fiber in your diet - this will make you regular - you can eat high fiber foods or use metamucil as a supplement - it is really important to drink enough water when using fiber supplements.  If your stools are hard or are formed balls or you have to strain a stool softener will help - use colace 2-3 capsule a day  For gentle treatment of constipation Use Miralax 1-2 capfuls a day until your stools are soft and regular and then decrease the usage - you can use this daily retrained to work properly.           The patient is advised to call or return to clinic if he does not see an improvement in symptoms, or to seek the care of the closest emergency department if he worsens with the above plan.   Deliah Boston, MHS, PA-C 01/17/2018 12:05 PM   Ofilia Neas, PA-C 01/17/18 1206

## 2018-01-17 NOTE — Discharge Instructions (Addendum)
For constipation   Make sure you are drinking enough water daily. Make sure you are getting enough fiber in your diet - this will make you regular - you can eat high fiber foods or use metamucil as a supplement - it is really important to drink enough water when using fiber supplements.  If your stools are hard or are formed balls or you have to strain a stool softener will help - use colace 2-3 capsule a day  For gentle treatment of constipation Use Miralax 1-2 capfuls a day until your stools are soft and regular and then decrease the usage - you can use this daily retrained to work properly.

## 2018-01-17 NOTE — Telephone Encounter (Signed)
Pt informed of below.  

## 2018-01-17 NOTE — Telephone Encounter (Signed)
Patient went to the urgent care today, because the pain was to much. Please advise

## 2018-01-17 NOTE — ED Triage Notes (Signed)
Pt reports left lower back pain and generalized abdominal pain that he describes as bloating and tightness.

## 2018-01-17 NOTE — Telephone Encounter (Signed)
See phone message from yesterday 

## 2018-01-17 NOTE — Telephone Encounter (Signed)
Per 01/16/18 tele note Pt informed to reduce his Benicar HCT 40-25 to 1/2 tab per day.     Please try reducing to 1/2 pill per day.  Please call or message [place] next week, to tell us how you are feeling

## 2018-01-17 NOTE — Telephone Encounter (Signed)
I called pt to advise of below. I spoke to a lady who states pt is not home. I will try calling back later.

## 2018-02-14 ENCOUNTER — Other Ambulatory Visit: Payer: Self-pay | Admitting: Endocrinology

## 2018-02-15 ENCOUNTER — Telehealth: Payer: Self-pay | Admitting: Endocrinology

## 2018-02-15 ENCOUNTER — Other Ambulatory Visit: Payer: Self-pay | Admitting: Endocrinology

## 2018-02-15 MED ORDER — LOSARTAN POTASSIUM-HCTZ 100-25 MG PO TABS: 1.0000 | ORAL_TABLET | Freq: Every day | ORAL | 0 refills | Status: DC

## 2018-02-15 NOTE — Telephone Encounter (Signed)
I have sent a prescription to your pharmacy, for the losartan-HCT instead

## 2018-02-15 NOTE — Telephone Encounter (Signed)
-----   Message from Davis GourdSarah E Terrell, CMA sent at 02/15/2018  4:40 PM EDT ----- Patient stated that pharmacy is out of Benicar. He does not want to run out before the weekend. We have received calls that a lot of pharmacies are out. Can we prescribe alternative? Patient is concerned?

## 2018-02-18 NOTE — Telephone Encounter (Signed)
I tried to call patient, but received no answer.  

## 2018-03-16 ENCOUNTER — Other Ambulatory Visit: Payer: Self-pay | Admitting: Endocrinology

## 2018-04-17 ENCOUNTER — Telehealth: Payer: Self-pay | Admitting: Endocrinology

## 2018-04-17 ENCOUNTER — Other Ambulatory Visit: Payer: Self-pay | Admitting: Endocrinology

## 2018-04-17 NOTE — Telephone Encounter (Signed)
please call pharmacy: What alternative do they have?

## 2018-04-17 NOTE — Telephone Encounter (Signed)
Patient stated he is needing his blood pressure medication refilled. I did not see the medication on his medication list. He said that Dr Everardo AllEllison had him take only a half a pill due to his back hurting and after doing this his back has not been hurting.  Please advise   [facility] [phone] - , [place] - [address]

## 2018-04-17 NOTE — Telephone Encounter (Signed)
I have sent Olmesartan to patient's pharmacy.

## 2018-04-17 NOTE — Telephone Encounter (Signed)
Pt called and stated his blood pressure medicine is on back order and wants to know if something else can be called in instead thanks.

## 2018-04-17 NOTE — Telephone Encounter (Signed)
Please advise on alternative.  

## 2018-04-18 ENCOUNTER — Other Ambulatory Visit: Payer: Self-pay

## 2018-04-18 ENCOUNTER — Telehealth: Payer: Self-pay | Admitting: Endocrinology

## 2018-04-18 NOTE — Telephone Encounter (Signed)
error 

## 2018-04-18 NOTE — Patient Outreach (Signed)
[facility] Care Management  04/18/2018  Roberto ClevelandRoy Couzens Aug 19, 1951 [id number]   Medication Adherence call to [name] patient is due on Olmesartan/Hctz 40/25 mg spoke with patient he [facility] but was told that the medication is on back order patient already call his doctor to get an alternative and is waiting for doctor to call the [facility] with an alternative. [name] is showing past due under [facility].  Lillia AbedAna Ollison-Moran CPhT Pharmacy Technician [facility] Care Management Direct Dial [phone]  Fax [phone] [email or web address]

## 2018-04-19 ENCOUNTER — Telehealth: Payer: Self-pay | Admitting: Endocrinology

## 2018-04-19 NOTE — Telephone Encounter (Signed)
Please advise 

## 2018-04-19 NOTE — Telephone Encounter (Signed)
please call pharmacy: What alternative do they have in stock?

## 2018-04-19 NOTE — Telephone Encounter (Signed)
Per [facility]:Caller's RX is on backorder and he hasn't had it in 2 days. Would like to see if it can be changed to something else. Additional Documentation: Olmesartan htz 40/25mg  take 0.5 pill daily

## 2018-05-15 ENCOUNTER — Other Ambulatory Visit: Payer: Self-pay | Admitting: Endocrinology

## 2018-06-05 ENCOUNTER — Other Ambulatory Visit: Payer: Self-pay

## 2018-06-05 NOTE — Patient Outreach (Signed)
[facility]) Care Management  06/05/2018  Roberto Rivera [birth year] [id number]   Medication Adherence call to Roberto Rivera spoke with patient is due on Olmesartan/ HCTZ 40/25 mg he is only taking 1/2 tablet because [facility] has it on back order. [facility]  have the medication in stock again an order was place  for patient to pick up, call patient back to let him know his medication will be ready for him. Roberto Rivera is showing past due under [facility] Ins.   Lillia Abed CPhT Pharmacy Technician [facility] Care Management Direct Dial [phone]  Fax [phone] [email or web address]

## 2018-06-14 ENCOUNTER — Other Ambulatory Visit: Payer: Self-pay | Admitting: Endocrinology

## 2018-06-18 ENCOUNTER — Other Ambulatory Visit: Payer: Self-pay

## 2018-06-18 MED ORDER — OLMESARTAN MEDOXOMIL-HCTZ 40-25 MG PO TABS: 1.0000 | ORAL_TABLET | Freq: Every day | ORAL | 0 refills | Status: DC

## 2018-06-27 ENCOUNTER — Ambulatory Visit (INDEPENDENT_AMBULATORY_CARE_PROVIDER_SITE_OTHER): Payer: Medicare Other | Admitting: Endocrinology

## 2018-06-27 ENCOUNTER — Encounter: Payer: Self-pay | Admitting: Endocrinology

## 2018-06-27 VITALS — BP 124/70 | HR 75 | Ht 69.0 in | Wt 239.4 lb

## 2018-06-27 DIAGNOSIS — I10 Essential (primary) hypertension: Secondary | ICD-10-CM | POA: Diagnosis not present

## 2018-06-27 DIAGNOSIS — E1121 Type 2 diabetes mellitus with diabetic nephropathy: Secondary | ICD-10-CM

## 2018-06-27 DIAGNOSIS — R229 Localized swelling, mass and lump, unspecified: Secondary | ICD-10-CM

## 2018-06-27 LAB — POCT GLYCOSYLATED HEMOGLOBIN (HGB A1C): Hemoglobin A1C: 6.1 % — AB (ref 4.0–5.6)

## 2018-06-27 MED ORDER — CANDESARTAN CILEXETIL-HCTZ 16-12.5 MG PO TABS: 1.0000 | ORAL_TABLET | Freq: Every day | ORAL | 3 refills | Status: DC

## 2018-06-27 NOTE — Progress Notes (Signed)
Subjective:    Patient ID: Roberto Rivera, male    DOB: [birth year], 67 y.o.   MRN: [id number]  HPI Pt is here for regular wellness examination, and is feeling pretty well in general, and says chronic med probs are stable, except as noted below.  He takes benicar-HCT 1/2 tab qd.  Pharmacy cannot obtain.   Past Medical History:  Diagnosis Date  . ALLERGIC RHINITIS 04/01/2007  . Diabetes mellitus   . DIABETES MELLITUS, TYPE II 04/01/2007  . 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 Hypercholesteremia   . HYPERCHOLESTEROLEMIA 02/07/2008  . Hypertension   . HYPERTENSION 04/01/2007  . MICROCYTOSIS 02/07/2008  . OSTEOARTHRITIS, SPINE 02/07/2008  . THYROID CYST 02/07/2008    No past surgical history on file.  Social History   Socioeconomic History  . Marital status: Married    Spouse name: Not on file  . Number of children: Not on file  . Years of education: Not on file  . Highest education level: Not on file  Occupational History  . Occupation: Theatre stage manager: [employer]  Social Needs  . Financial resource strain: Not on file  . Food insecurity:    Worry: Not on file    Inability: Not on file  . Transportation needs:    Medical: Not on file    Non-medical: Not on file  Tobacco Use  . Smoking status: Current Every Day Smoker    Packs/day: 1.00    Types: Cigarettes  . Smokeless tobacco: Never Used  Substance and Sexual Activity  . Alcohol use: No    Alcohol/week: 0.0 standard drinks  . Drug use: No  . Sexual activity: Not on file  Lifestyle  . Physical activity:    Days per week: Not on file    Minutes per session: Not on file  . Stress: Not on file  Relationships  . Social connections:    Talks on phone: Not on file    Gets together: Not on file    Attends religious service: Not on file    Active member of club or organization: Not on file    Attends meetings of clubs or organizations: Not on file    Relationship status: Not on file  . Intimate partner  violence:    Fear of current or ex partner: Not on file    Emotionally abused: Not on file    Physically abused: Not on file    Forced sexual activity: Not on file  Other Topics Concern  . Not on file  Social History Narrative   Divorced x many years    Current Outpatient Medications on File Prior to Visit  Medication Sig Dispense Refill  . amLODipine (NORVASC) 10 MG tablet TAKE 2 TABLETS(20 MG) BY MOUTH DAILY 60 tablet 0  . clotrimazole-betamethasone (LOTRISONE) cream Apply 1 application 2 (two) times daily topically. As needed for foot itching 90 g 5  . cyclobenzaprine (FLEXERIL) 10 MG tablet Take 0.5-1 tablets (5-10 mg total) by mouth 3 (three) times daily as needed for muscle spasms. 30 tablet 0  . glucose blood (ONE TOUCH ULTRA TEST) test strip Use as instructed once daily and lancets 250.00    . metFORMIN (GLUCOPHAGE-XR) 500 MG 24 hr tablet Take 1 tablet (500 mg total) by mouth daily with breakfast. 90 tablet 3  . metoprolol succinate (TOPROL-XL) 50 MG 24 hr tablet TAKE ONE-HALF TABLET BY MOUTH EVERY DAY 45 tablet 0  . potassium chloride (KLOR-CON) 8 MEQ tablet TAKE 2 TABLETS(16  MEQ) BY MOUTH DAILY 60 tablet 0  . rosuvastatin (CRESTOR) 5 MG tablet Take 1 tablet (5 mg total) by mouth daily. 30 tablet 11  . tadalafil (CIALIS) 20 MG tablet Take 20 mg by mouth daily as needed for erectile dysfunction.    . traMADol (ULTRAM) 50 MG tablet Take 0.5-1 tablets (25-50 mg total) by mouth every 6 (six) hours as needed for severe pain. 15 tablet 0   No current facility-administered medications on file prior to visit.     No Known Allergies  Family History  Problem Relation Age of Onset  . Cancer Mother        had uncertain type of cancer    BP 124/70 (BP Location: Left Arm, Patient Position: Sitting, Cuff Size: Normal)   Pulse 75   Ht 5\' 9"  (1.753 m)   Wt 239 lb 6.4 oz (108.6 kg)   SpO2 94%   BMI 35.35 kg/m    Review of Systems Denies fever, fatigue, visual loss, hearing loss,  chest pain, sob, back pain, depression, cold intolerance, BRBPR, hematuria, syncope, numbness, allergy sxs, easy bruising, and rash.     Objective:   Physical Exam  VS: see vs page GEN: no distress HEAD: head: no deformity eyes: no periorbital swelling, no proptosis.   external nose and ears are normal mouth: no lesion seen NECK: supple, thyroid is not enlarged CHEST WALL: no deformity LUNGS: clear to auscultation CV: reg rate and rhythm, no murmur ABD: abdomen is soft, nontender.  no hepatosplenomegaly.  not distended.  no hernia MUSCULOSKELETAL: muscle bulk and strength are grossly normal.  no obvious joint swelling.  gait is normal and steady EXTEMITIES: no deformity.  no ulcer on the feet.  feet are of normal color and temp.  1+ bilat leg edema PULSES: dorsalis pedis intact bilat.  no carotid bruit NEURO:  cn 2-12 grossly intact.   readily moves all 4's.  sensation is intact to touch on the feet SKIN:  Normal texture and temperature.  No rash or suspicious lesion is visible.   NODES:  None palpable at the neck PSYCH: alert, well-oriented.  Does not appear anxious nor depressed.   I personally reviewed electrocardiogram tracing (today): Indication: DM Impression: lat inverted T-waves Compared to 2018: no significant change   Lab Results  Component Value Date   HGBA1C 6.1 (A) 06/27/2018      Assessment & Plan:  Wellness visit today, with problems stable, except as noted.    Patient Instructions  Please consider these measures for your health:  minimize alcohol.  Do not use tobacco products.  Have a colonoscopy at least every 10 years from age 40. Keep firearms safely stored.  Always use seat belts.  have working smoke alarms in your home.  See an eye doctor and dentist regularly.  Never drive under the influence of alcohol or drugs (including prescription drugs).   I have sent a prescription to your pharmacy, for an alternative to the olmesartan-HCT. Best wishes with your  new primary care provider

## 2018-06-27 NOTE — Patient Instructions (Addendum)
Please consider these measures for your health:  minimize alcohol.  Do not use tobacco products.  Have a colonoscopy at least every 10 years from age 66. Keep firearms safely stored.  Always use seat belts.  have working smoke alarms in your home.  See an eye doctor and dentist regularly.  Never drive under the influence of alcohol or drugs (including prescription drugs).   I have sent a prescription to your pharmacy, for an alternative to the olmesartan-HCT. Best wishes with your new primary care provider

## 2018-06-30 ENCOUNTER — Encounter (HOSPITAL_COMMUNITY): Payer: Self-pay

## 2018-06-30 ENCOUNTER — Ambulatory Visit (HOSPITAL_COMMUNITY)
Admission: EM | Admit: 2018-06-30 | Discharge: 2018-06-30 | Disposition: A | Discharge status: Home / Self Care | Payer: Medicare Other | Attending: Family Medicine | Admitting: Family Medicine

## 2018-06-30 DIAGNOSIS — E119 Type 2 diabetes mellitus without complications: Secondary | ICD-10-CM | POA: Diagnosis not present

## 2018-06-30 LAB — GLUCOSE, CAPILLARY: Glucose-Capillary: 191 mg/dL — ABNORMAL HIGH (ref 70–99)

## 2018-06-30 NOTE — ED Provider Notes (Signed)
[facility]    CSN: [id number] Arrival date & time: 06/30/18  1129     History   Chief Complaint Chief Complaint  Patient presents with  . Elevated Blood Sugar Levels    HPI Roberto Rivera is a 67 y.o. male.   HPI  Patient is here for an elevated blood sugar.  His sugar this morning was 209.  He states that the highest sugar that he remembers having.  He came into be checked.  No cough cold or runny nose.  No nausea vomiting diarrhea.  No fever or recent illness.  No urinary symptoms. He has well-controlled diabetes mellitus.  He takes metformin XR daily.  He is fairly good about carbohydrates and sweets.  His last A1c was 6.1 on 06/27/2018. He does not have a good understanding of sugar control, thinking that 209 is dangerously high.  I did discuss with him his goals of diabetes management, and recommend to him a diabetes educator. He is good about taking his blood pressure medications daily.  His blood pressure is usually well controlled.  But higher than usual today.  Not surprising given his anxiety.  Past Medical History:  Diagnosis Date  . ALLERGIC RHINITIS 04/01/2007  . Diabetes mellitus   . DIABETES MELLITUS, TYPE II 04/01/2007  . 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 Hypercholesteremia   . HYPERCHOLESTEROLEMIA 02/07/2008  . Hypertension   . HYPERTENSION 04/01/2007  . MICROCYTOSIS 02/07/2008  . OSTEOARTHRITIS, SPINE 02/07/2008  . THYROID CYST 02/07/2008    Patient Active Problem List   Diagnosis Date Noted  . Hypokalemia 06/26/2017  . Back pain 05/24/2017  . Abnormal ECG 04/25/2017  . Screening examination for infectious disease 03/10/2016  . Skin nodule 02/26/2015  . Routine general medical examination at a health care facility 04/26/2013  . Rash and nonspecific skin eruption 04/26/2013  . Left knee pain 04/11/2013  . Diabetes (HCC) 10/13/2011  . Screening for prostate cancer 10/13/2011  . Encounter for long-term (current) use of other medications  10/13/2011  . SMOKER 02/04/2010  . EDEMA 02/04/2010  . PROTEINURIA, MILD 01/29/2009  . THYROID CYST 02/07/2008  . HYPERCHOLESTEROLEMIA 02/07/2008  . 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 OSTEOARTHRITIS, SPINE 02/07/2008  . MICROCYTOSIS 02/07/2008  . Essential hypertension 04/01/2007  . ALLERGIC RHINITIS 04/01/2007    History reviewed. No pertinent surgical history.     Home Medications    Prior to Admission medications   Medication Sig Start Date End Date Taking? Authorizing Provider  amLODipine (NORVASC) 10 MG tablet TAKE 2 TABLETS(20 MG) BY MOUTH DAILY 05/16/18   Romero Belling, MD  candesartan-hydrochlorothiazide (ATACAND HCT) 16-12.5 MG tablet Take 1 tablet by mouth daily. 06/27/18   Romero Belling, MD  glucose blood (ONE TOUCH ULTRA TEST) test strip Use as instructed once daily and lancets 250.00    [provider]  metFORMIN (GLUCOPHAGE-XR) 500 MG 24 hr tablet Take 1 tablet (500 mg total) by mouth daily with breakfast. 04/26/17   Romero Belling, MD  metoprolol succinate (TOPROL-XL) 50 MG 24 hr tablet TAKE ONE-HALF TABLET BY MOUTH EVERY DAY 06/14/18   Romero Belling, MD  potassium chloride (KLOR-CON) 8 MEQ tablet TAKE 2 TABLETS(16 MEQ) BY MOUTH DAILY 05/16/18   Romero Belling, MD  rosuvastatin (CRESTOR) 5 MG tablet Take 1 tablet (5 mg total) by mouth daily. 04/26/17   Romero Belling, MD  tadalafil (CIALIS) 20 MG tablet Take 20 mg by mouth daily as needed for erectile dysfunction.    [provider]  traMADol (  ULTRAM) 50 MG tablet Take 0.5-1 tablets (25-50 mg total) by mouth every 6 (six) hours as needed for severe pain. 01/17/18   Ofilia Neas, PA-C    Family History Family History  Problem Relation Age of Onset  . Cancer Mother        had uncertain type of cancer    Social History Social History   Tobacco Use  . Smoking status: Current Every Day Smoker    Packs/day: 1.00    Types: Cigarettes  . Smokeless tobacco: Never Used  Substance Use Topics  .  Alcohol use: No    Alcohol/week: 0.0 standard drinks  . Drug use: No     Allergies   Patient has no known allergies.   Review of Systems Review of Systems  Constitutional: Negative for chills and fever.  HENT: Negative for ear pain and sore throat.   Eyes: Negative for pain and visual disturbance.  Respiratory: Negative for cough and shortness of breath.   Cardiovascular: Negative for chest pain and palpitations.  Gastrointestinal: Negative for abdominal pain and vomiting.  Genitourinary: Negative for dysuria and hematuria.  Musculoskeletal: Negative for arthralgias and back pain.  Skin: Negative for color change and rash.  Neurological: Negative for seizures and syncope.  All other systems reviewed and are negative.    Physical Exam Triage Vital Signs ED Triage Vitals  Enc Vitals Group     BP 06/30/18 1151 (!) 142/92     Pulse Rate 06/30/18 1151 87     Resp 06/30/18 1151 20     Temp 06/30/18 1151 98.3 F (36.8 C)     Temp Source 06/30/18 1151 Oral     SpO2 06/30/18 1151 98 %     Weight --      Height --      Head Circumference --      Peak Flow --      Pain Score 06/30/18 1153 0     Pain Loc --      Pain Edu? --      Excl. in GC? --    No data found.  Updated Vital Signs BP (!) 142/92 (BP Location: Right Arm)   Pulse 87   Temp 98.3 F (36.8 C) (Oral)   Resp 20   SpO2 98%   Visual Acuity Right Eye Distance:   Left Eye Distance:   Bilateral Distance:    Right Eye Near:   Left Eye Near:    Bilateral Near:     Physical Exam  Constitutional: He appears well-developed and well-nourished. No distress.  HENT:  Head: Normocephalic and atraumatic.  Mouth/Throat: Oropharynx is clear and moist.  Eyes: Pupils are equal, round, and reactive to light. Conjunctivae are normal.  Neck: Normal range of motion. No thyromegaly present.  Cardiovascular: Normal rate, regular rhythm and normal heart sounds.  Pulmonary/Chest: Effort normal and breath sounds normal. No  respiratory distress. He has no wheezes.  Abdominal: Soft. Bowel sounds are normal. He exhibits no distension.  Musculoskeletal: Normal range of motion. He exhibits no edema.  Lymphadenopathy:    He has no cervical adenopathy.  Neurological: He is alert.  Skin: Skin is warm and dry.  Psychiatric: He has a normal mood and affect. His behavior is normal.     UC Treatments / Results  Labs (all labs ordered are listed, but only abnormal results are displayed) Labs Reviewed  GLUCOSE, CAPILLARY - Abnormal; Notable for the following components:      Result Value  Glucose-Capillary 191 (*)    All other components within normal limits    EKG None  Radiology No results found.  Procedures Procedures (including critical care time)  Medications Ordered in UC Medications - No data to display  Initial Impression / Assessment and Plan / UC Course  I have reviewed the triage vital signs and the nursing notes.  Pertinent labs & imaging results that were available during my care of the patient were reviewed by me and considered in my medical decision making (see chart for details).     See HPI Final Clinical Impressions(s) / UC Diagnoses   Final diagnoses:  Type 2 diabetes mellitus without complication, without long-term current use of insulin (HCC)     Discharge Instructions     In general, your diabetes is very well controlled with a hemoglobin A1c of 6.1 Be careful with your diabetic diet.  Limit your carbohydrates.  Avoid sweets. You need to drink more water. I recommend that you take a walk, or get some exercise every day Follow-up with your primary care doctor in 6 months   ED Prescriptions    None     Controlled Substance Prescriptions [place] Controlled Substance Registry consulted? Not Applicable   Eustace Moore, MD 06/30/18 1946

## 2018-06-30 NOTE — Discharge Instructions (Addendum)
In general, your diabetes is very well controlled with a hemoglobin A1c of 6.1 Be careful with your diabetic diet.  Limit your carbohydrates.  Avoid sweets. You need to drink more water. I recommend that you take a walk, or get some exercise every day Follow-up with your primary care doctor in 6 months

## 2018-06-30 NOTE — ED Triage Notes (Signed)
Pt presents with elevated blood sugar levels, not associated with anything in particular.

## 2018-07-14 ENCOUNTER — Other Ambulatory Visit: Payer: Self-pay | Admitting: Endocrinology

## 2018-07-14 NOTE — Telephone Encounter (Signed)
Please refill x 3 months Further refills would have to be considered by new PCP   

## 2018-07-23 DIAGNOSIS — B079 Viral wart, unspecified: Secondary | ICD-10-CM | POA: Diagnosis not present

## 2018-07-23 DIAGNOSIS — B078 Other viral warts: Secondary | ICD-10-CM | POA: Diagnosis not present

## 2018-08-13 ENCOUNTER — Other Ambulatory Visit: Payer: Self-pay | Admitting: Endocrinology

## 2018-08-31 ENCOUNTER — Other Ambulatory Visit: Payer: Self-pay | Admitting: Endocrinology

## 2018-08-31 NOTE — Telephone Encounter (Signed)
Please refill x 3 months Further refills would have to be considered by new PCP   

## 2018-09-12 ENCOUNTER — Other Ambulatory Visit: Payer: Self-pay | Admitting: Endocrinology

## 2018-10-12 ENCOUNTER — Other Ambulatory Visit: Payer: Self-pay | Admitting: Endocrinology

## 2018-10-12 NOTE — Telephone Encounter (Signed)
Please refill x 3 months Further refills would have to be considered by new PCP   

## 2018-12-05 ENCOUNTER — Other Ambulatory Visit: Payer: Self-pay | Admitting: Endocrinology

## 2018-12-05 NOTE — Telephone Encounter (Signed)
Please refill x 3 months Further refills would have to be considered by new PCP   

## 2018-12-26 ENCOUNTER — Encounter: Payer: Self-pay | Admitting: Endocrinology

## 2018-12-26 ENCOUNTER — Other Ambulatory Visit: Payer: Self-pay

## 2018-12-26 ENCOUNTER — Ambulatory Visit (INDEPENDENT_AMBULATORY_CARE_PROVIDER_SITE_OTHER): Payer: Medicare Other | Admitting: Endocrinology

## 2018-12-26 ENCOUNTER — Other Ambulatory Visit: Payer: Self-pay | Admitting: Endocrinology

## 2018-12-26 VITALS — BP 116/60 | HR 82 | Ht 69.0 in | Wt 255.0 lb

## 2018-12-26 DIAGNOSIS — E1121 Type 2 diabetes mellitus with diabetic nephropathy: Secondary | ICD-10-CM | POA: Diagnosis not present

## 2018-12-26 LAB — POCT GLYCOSYLATED HEMOGLOBIN (HGB A1C): Hemoglobin A1C: 7.4 % — AB (ref 4.0–5.6)

## 2018-12-26 MED ORDER — GLUCOSE BLOOD VI STRP: 1.0000 | ORAL_STRIP | Freq: Every day | 0 refills | Status: DC

## 2018-12-26 MED ORDER — METFORMIN HCL ER 500 MG PO TB24: ORAL_TABLET | ORAL | 0 refills | Status: DC

## 2018-12-26 MED ORDER — ROSUVASTATIN CALCIUM 5 MG PO TABS: ORAL_TABLET | ORAL | 0 refills | Status: DC

## 2018-12-26 MED ORDER — AMLODIPINE BESYLATE 10 MG PO TABS: 10.0000 mg | ORAL_TABLET | Freq: Every day | ORAL | 0 refills | Status: DC

## 2018-12-26 MED ORDER — METOPROLOL SUCCINATE ER 50 MG PO TB24: 25.0000 mg | ORAL_TABLET | Freq: Every day | ORAL | 0 refills | Status: DC

## 2018-12-26 MED ORDER — CANDESARTAN CILEXETIL-HCTZ 16-12.5 MG PO TABS: 1.0000 | ORAL_TABLET | Freq: Every day | ORAL | 0 refills | Status: DC

## 2018-12-26 MED ORDER — POTASSIUM CHLORIDE ER 8 MEQ PO TBCR: EXTENDED_RELEASE_TABLET | ORAL | 0 refills | Status: DC

## 2018-12-26 NOTE — Patient Instructions (Addendum)
Please reduce the amlodipine to 1 pill per day. Please continue the same other medications. Blood tests are requested for you today.  We'll let you know about the results.  Best wishes with your new primary care provider.  Please call to make an appointment.   Please come back for a follow-up appointment in 6 months, for the diabetes.

## 2018-12-26 NOTE — Progress Notes (Signed)
Subjective:    Patient ID: Roberto Rivera, male    DOB: 1950-11-30, 68 y.o.   MRN: [id number]  HPI Pt returns for f/u of diabetes mellitus: DM type: 2 Dx'ed: 2015. Complications: renal insuff Therapy: metformin DKA: never Severe hypoglycemia: never.  Pancreatitis: never Pancreatic imaging: never Other: he has never taken insulin Interval history: he has not yet chosen a new primary care provider.   pt states he feels well in general.   Past Medical History:  Diagnosis Date  . ALLERGIC RHINITIS 04/01/2007  . Diabetes mellitus   . DIABETES MELLITUS, TYPE II 04/01/2007  . 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 Hypercholesteremia   . HYPERCHOLESTEROLEMIA 02/07/2008  . Hypertension   . HYPERTENSION 04/01/2007  . MICROCYTOSIS 02/07/2008  . OSTEOARTHRITIS, SPINE 02/07/2008  . THYROID CYST 02/07/2008    No past surgical history on file.  Social History   Socioeconomic History  . Marital status: Married    Spouse name: Not on file  . Number of children: Not on file  . Years of education: Not on file  . Highest education level: Not on file  Occupational History  . Occupation: Theatre stage manager: [employer]  Social Needs  . Financial resource strain: Not on file  . Food insecurity:    Worry: Not on file    Inability: Not on file  . Transportation needs:    Medical: Not on file    Non-medical: Not on file  Tobacco Use  . Smoking status: Current Every Day Smoker    Packs/day: 1.00    Types: Cigarettes  . Smokeless tobacco: Never Used  Substance and Sexual Activity  . Alcohol use: No    Alcohol/week: 0.0 standard drinks  . Drug use: No  . Sexual activity: Not on file  Lifestyle  . Physical activity:    Days per week: Not on file    Minutes per session: Not on file  . Stress: Not on file  Relationships  . Social connections:    Talks on phone: Not on file    Gets together: Not on file    Attends religious service: Not on file    Active member of club or  organization: Not on file    Attends meetings of clubs or organizations: Not on file    Relationship status: Not on file  . Intimate partner violence:    Fear of current or ex partner: Not on file    Emotionally abused: Not on file    Physically abused: Not on file    Forced sexual activity: Not on file  Other Topics Concern  . Not on file  Social History Narrative   Divorced x many years    Current Outpatient Medications on File Prior to Visit  Medication Sig Dispense Refill  . tadalafil (CIALIS) 20 MG tablet Take 20 mg by mouth daily as needed for erectile dysfunction.     No current facility-administered medications on file prior to visit.     No Known Allergies  Family History  Problem Relation Age of Onset  . Cancer Mother        had uncertain type of cancer    BP 116/60 (BP Location: Left Arm, Patient Position: Sitting, Cuff Size: Large)   Pulse 82   Ht 5\' 9"  (1.753 m)   Wt 255 lb (115.7 kg)   SpO2 94%   BMI 37.66 kg/m    Review of Systems Denies sob    Objective:   Physical  Exam VITAL SIGNS:  See vs page GENERAL: no distress Pulses: dorsalis pedis intact bilat.   MSK: no deformity of the feet CV: 1+ bilat leg edema Skin:  no ulcer on the feet.  normal color and temp on the feet. Neuro: sensation is intact to touch on the feet Ext: There is bilateral onychomycosis of the toenails.    Lab Results  Component Value Date   HGBA1C 7.4 (A) 12/26/2018   Lab Results  Component Value Date   CREATININE 1.50 12/26/2018   BUN 28 (H) 12/26/2018   NA 142 12/26/2018   K 4.1 12/26/2018   CL 105 12/26/2018   CO2 27 12/26/2018       Assessment & Plan:  HTN: overcontrolled Edema: prob due to norvasc Type 2 DM: he needs increased rx.  Add Januvia   Patient Instructions  Please reduce the amlodipine to 1 pill per day. Please continue the same other medications. Blood tests are requested for you today.  We'll let you know about the results.  Best wishes  with your new primary care provider.  Please call to make an appointment.   Please come back for a follow-up appointment in 6 months, for the diabetes.

## 2018-12-27 ENCOUNTER — Other Ambulatory Visit: Payer: Self-pay

## 2018-12-27 LAB — BASIC METABOLIC PANEL
BUN: 28 mg/dL — ABNORMAL HIGH (ref 6–23)
CO2: 27 mEq/L (ref 19–32)
Calcium: 9.5 mg/dL (ref 8.4–10.5)
Chloride: 105 mEq/L (ref 96–112)
Creatinine, Ser: 1.5 mg/dL (ref 0.40–1.50)
GFR: 56.45 mL/min — ABNORMAL LOW (ref >60.00)
Glucose, Bld: 114 mg/dL — ABNORMAL HIGH (ref 70–99)
Potassium: 4.1 mEq/L (ref 3.5–5.1)
Sodium: 142 mEq/L (ref 135–145)

## 2018-12-27 LAB — LIPID PANEL
Cholesterol: 108 mg/dL (ref 0–200)
HDL: 36.8 mg/dL — ABNORMAL LOW (ref >39.00)
LDL Cholesterol: 56 mg/dL (ref 0–99)
NonHDL: 71.63
Total CHOL/HDL Ratio: 3
Triglycerides: 79 mg/dL (ref 0.0–149.0)
VLDL: 15.8 mg/dL (ref 0.0–40.0)

## 2018-12-27 LAB — TSH: TSH: 1.07 u[IU]/mL (ref 0.35–4.50)

## 2018-12-27 MED ORDER — SITAGLIPTIN PHOSPHATE 50 MG PO TABS: 50.0000 mg | ORAL_TABLET | Freq: Every day | ORAL | 3 refills | Status: DC

## 2018-12-27 MED ORDER — ACCU-CHEK AVIVA DEVI: 0 refills | Status: DC

## 2018-12-27 MED ORDER — ACCU-CHEK FASTCLIX LANCETS MISC: 1.0000 | Freq: Every day | 2 refills | Status: DC

## 2018-12-27 MED ORDER — GLUCOSE BLOOD VI STRP: ORAL_STRIP | 12 refills | Status: DC

## 2019-01-02 ENCOUNTER — Telehealth: Payer: Self-pay | Admitting: Endocrinology

## 2019-01-02 NOTE — Telephone Encounter (Signed)
Patient called regarding letter received regarding Januvia - note in box

## 2019-01-02 NOTE — Telephone Encounter (Signed)
Returned pt call. Advised Januvia was "ADDED" to his regimen. According to Dr. George Hugh orders, other medications are not stopped nor changed. Pt added he cannot afford to pay $200 for the Rx. Wants to know what he can do. Message routed to Dr. Everardo All

## 2019-01-02 NOTE — Telephone Encounter (Signed)
I need to know is there another med in the same category that has a better price?

## 2019-01-02 NOTE — Telephone Encounter (Signed)
Called pt and advised to contact either pharmacy or insurance company. Advised to ask which medication in the same classification as Januvia would be most affordable. Advised to call our office with this information and Rx can be written. Verbalized acceptance and understanding.

## 2019-01-30 ENCOUNTER — Telehealth: Payer: Self-pay | Admitting: Internal Medicine

## 2019-01-30 NOTE — Telephone Encounter (Signed)
Patient is requesting to re-establish care with you if possible. (See message below) Would you be willing to this patient to establish.

## 2019-01-30 NOTE — Telephone Encounter (Signed)
Copied from [place] [id number]. Topic: General - Inquiry >> Jan 30, 2019 12:36 PM Virl Axe D wrote: Reason for CRM: Pt called to inquire if Dr. Jenny Reichmann would take him on again as a pt. He was a pt of his years ago before he was referred to Dr. Loanne Drilling who is no longer practicing primary care. Please advise. CB#[phone]

## 2019-01-30 NOTE — Telephone Encounter (Signed)
Ok with me 

## 2019-01-31 NOTE — Telephone Encounter (Signed)
Appointment scheduled.

## 2019-02-05 ENCOUNTER — Encounter: Payer: Self-pay | Admitting: Internal Medicine

## 2019-02-05 ENCOUNTER — Ambulatory Visit (INDEPENDENT_AMBULATORY_CARE_PROVIDER_SITE_OTHER): Payer: Medicare Other | Admitting: Internal Medicine

## 2019-02-05 ENCOUNTER — Other Ambulatory Visit (INDEPENDENT_AMBULATORY_CARE_PROVIDER_SITE_OTHER): Payer: Medicare Other

## 2019-02-05 ENCOUNTER — Other Ambulatory Visit: Payer: Self-pay

## 2019-02-05 VITALS — BP 130/84 | HR 90 | Temp 98.6°F | Ht 69.0 in | Wt 248.0 lb

## 2019-02-05 DIAGNOSIS — I10 Essential (primary) hypertension: Secondary | ICD-10-CM

## 2019-02-05 DIAGNOSIS — E1121 Type 2 diabetes mellitus with diabetic nephropathy: Secondary | ICD-10-CM

## 2019-02-05 DIAGNOSIS — N183 Chronic kidney disease, stage 3 unspecified: Secondary | ICD-10-CM | POA: Insufficient documentation

## 2019-02-05 DIAGNOSIS — R1032 Left lower quadrant pain: Secondary | ICD-10-CM | POA: Diagnosis not present

## 2019-02-05 DIAGNOSIS — E559 Vitamin D deficiency, unspecified: Secondary | ICD-10-CM

## 2019-02-05 DIAGNOSIS — Z0000 Encounter for general adult medical examination without abnormal findings: Secondary | ICD-10-CM

## 2019-02-05 DIAGNOSIS — Z0001 Encounter for general adult medical examination with abnormal findings: Secondary | ICD-10-CM

## 2019-02-05 DIAGNOSIS — E611 Iron deficiency: Secondary | ICD-10-CM | POA: Diagnosis not present

## 2019-02-05 DIAGNOSIS — E538 Deficiency of other specified B group vitamins: Secondary | ICD-10-CM

## 2019-02-05 LAB — URINALYSIS, ROUTINE W REFLEX MICROSCOPIC
Bilirubin Urine: NEGATIVE
Hgb urine dipstick: NEGATIVE
Ketones, ur: NEGATIVE
Leukocytes,Ua: NEGATIVE
Nitrite: NEGATIVE
Specific Gravity, Urine: 1.015 (ref 1.000–1.030)
Total Protein, Urine: 30 — AB
Urine Glucose: NEGATIVE
Urobilinogen, UA: 0.2 (ref 0.0–1.0)
pH: 6 (ref 5.0–8.0)

## 2019-02-05 LAB — HEPATIC FUNCTION PANEL
ALT: 12 U/L (ref 0–53)
AST: 13 U/L (ref 0–37)
Albumin: 4.4 g/dL (ref 3.5–5.2)
Alkaline Phosphatase: 88 U/L (ref 39–117)
Bilirubin, Direct: 0.1 mg/dL (ref 0.0–0.3)
Total Bilirubin: 0.6 mg/dL (ref 0.2–1.2)
Total Protein: 6.9 g/dL (ref 6.0–8.3)

## 2019-02-05 LAB — CBC WITH DIFFERENTIAL/PLATELET
Basophils Absolute: 0 10*3/uL (ref 0.0–0.1)
Basophils Relative: 0.3 % (ref 0.0–3.0)
Eosinophils Absolute: 0.1 10*3/uL (ref 0.0–0.7)
Eosinophils Relative: 1.2 % (ref 0.0–5.0)
HCT: 46.9 % (ref 39.0–52.0)
Hemoglobin: 15.3 g/dL (ref 13.0–17.0)
Lymphocytes Relative: 34.9 % (ref 12.0–46.0)
Lymphs Abs: 3.5 10*3/uL (ref 0.7–4.0)
MCHC: 32.6 g/dL (ref 30.0–36.0)
MCV: 79.6 fl (ref 78.0–100.0)
Monocytes Absolute: 0.8 10*3/uL (ref 0.1–1.0)
Monocytes Relative: 7.9 % (ref 3.0–12.0)
Neutro Abs: 5.6 10*3/uL (ref 1.4–7.7)
Neutrophils Relative %: 55.7 % (ref 43.0–77.0)
Platelets: 236 10*3/uL (ref 150.0–400.0)
RBC: 5.88 Mil/uL — ABNORMAL HIGH (ref 4.22–5.81)
RDW: 15.8 % — ABNORMAL HIGH (ref 11.5–15.5)
WBC: 10 10*3/uL (ref 4.0–10.5)

## 2019-02-05 LAB — MICROALBUMIN / CREATININE URINE RATIO
Creatinine,U: 106 mg/dL
Microalb Creat Ratio: 18.9 mg/g (ref 0.0–30.0)
Microalb, Ur: 20 mg/dL — ABNORMAL HIGH (ref 0.0–1.9)

## 2019-02-05 LAB — IBC PANEL
Iron: 63 ug/dL (ref 42–165)
Saturation Ratios: 17.9 % — ABNORMAL LOW (ref 20.0–50.0)
Transferrin: 251 mg/dL (ref 212.0–360.0)

## 2019-02-05 LAB — BASIC METABOLIC PANEL
BUN: 19 mg/dL (ref 6–23)
CO2: 29 mEq/L (ref 19–32)
Calcium: 9.7 mg/dL (ref 8.4–10.5)
Chloride: 102 mEq/L (ref 96–112)
Creatinine, Ser: 1.29 mg/dL (ref 0.40–1.50)
GFR: 67.16 mL/min (ref >60.00)
Glucose, Bld: 105 mg/dL — ABNORMAL HIGH (ref 70–99)
Potassium: 3.8 mEq/L (ref 3.5–5.1)
Sodium: 141 mEq/L (ref 135–145)

## 2019-02-05 LAB — VITAMIN B12: Vitamin B-12: 414 pg/mL (ref 211–911)

## 2019-02-05 LAB — PSA: PSA: 2.3 ng/mL (ref 0.10–4.00)

## 2019-02-05 LAB — VITAMIN D 25 HYDROXY (VIT D DEFICIENCY, FRACTURES): VITD: 30.59 ng/mL (ref 30.00–100.00)

## 2019-02-05 MED ORDER — CANDESARTAN CILEXETIL-HCTZ 32-12.5 MG PO TABS: 1.0000 | ORAL_TABLET | Freq: Every day | ORAL | 3 refills | Status: DC

## 2019-02-05 NOTE — Assessment & Plan Note (Signed)
stable overall by history and exam, recent data reviewed with pt, and pt to continue medical treatment as before,  to f/u any worsening symptoms or concerns  

## 2019-02-05 NOTE — Assessment & Plan Note (Signed)
Mild uncontrolled, to increase the atacand HCT 32/12.5

## 2019-02-05 NOTE — Progress Notes (Signed)
Subjective:    Patient ID: Roberto Rivera, male    DOB: October 28, 1950, 68 y.o.   MRN: [id number]  HPI  Here for wellness and f/u;  Overall doing ok;  Pt denies Chest pain, worsening SOB, DOE, wheezing, orthopnea, PND, worsening LE edema, palpitations, dizziness or syncope.  Pt denies neurological change such as new headache, facial or extremity weakness.  Pt denies polydipsia, polyuria, or low sugar symptoms. Pt states overall good compliance with treatment and medications, good tolerability, and has been trying to follow appropriate diet.  Pt denies worsening depressive symptoms, suicidal ideation or panic. No fever, night sweats, wt loss, loss of appetite, or other constitutional symptoms.  Pt states good ability with ADL's, has low fall risk, home safety reviewed and adequate, no other significant changes in hearing or vision, and not active with exercise.  Pt denies polydipsia, polyuria, or low sugar symptoms such as weakness or confusion improved with po intake.  Pt states overall good compliance with meds, trying to follow lower cholesterol, diabetic diet, wt overall stable but little exercise however.   CBS in the low 100s.  BPs average 145/82 Did have a fall with trip and fall over basket of clothes, hit nose with only small nose bleed and swelling last wk, now improved except for swelling to tip of nose mild still.  Lost 7 lbs with better diet to improved his breath with bending over and his recurring LBP.  Pt continues to have recurring LBP without change in severity, bowel or bladder change, fever, wt loss,  worsening LE pain/numbness/weakness, gait change or falls. Does tend to have increased pain even standing for3 minutes. Hard to wash dishes that way.    Also with mod to severe left groin pain, worse to try get OOB most days and has to stop moving to rest before proceeding. Denies worsening reflux, abd pain, dysphagia, n/v, bowel change or blood.  Denies urinary symptoms such as dysuria, frequency,  urgency, flank pain, hematuria or n/v, fever, chills. .   Wt Readings from Last 3 Encounters:  02/05/19 248 lb (112.5 kg)  12/26/18 255 lb (115.7 kg)  06/27/18 239 lb 6.4 oz (108.6 kg)   Past Medical History:  Diagnosis Date  . ALLERGIC RHINITIS 04/01/2007  . Diabetes mellitus   . DIABETES MELLITUS, TYPE II 04/01/2007  . 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 Hypercholesteremia   . HYPERCHOLESTEROLEMIA 02/07/2008  . Hypertension   . HYPERTENSION 04/01/2007  . MICROCYTOSIS 02/07/2008  . OSTEOARTHRITIS, SPINE 02/07/2008  . THYROID CYST 02/07/2008   No past surgical history on file.  reports that he has been smoking cigarettes. He has been smoking about 1.00 pack per day. He has never used smokeless tobacco. He reports that he does not drink alcohol or use drugs. family history includes Cancer in his mother. No Known Allergies Current Outpatient Medications on File Prior to Visit  Medication Sig Dispense Refill  . Accu-Chek FastClix Lancets MISC 1 each by Does not apply route daily. Use to monitor glucose levels daily; E11.21 102 each 2  . amLODipine (NORVASC) 10 MG tablet Take 1 tablet (10 mg total) by mouth daily. 90 tablet 0  . Blood Glucose Monitoring Suppl (ACCU-CHEK AVIVA) device Use to monitor glucose levels daily; E11.21 1 each 0  . glucose blood (ACCU-CHEK AVIVA) test strip Use to monitor glucose levels daily; E11.21 100 each 12  . metFORMIN (GLUCOPHAGE-XR) 500 MG 24 hr tablet TAKE 1 TABLET BY MOUTH EVERY DAY WITH BREAKFAST 90 tablet 0  .  metoprolol succinate (TOPROL-XL) 50 MG 24 hr tablet TAKE 1 TABLET BY MOUTH DAILY. TAKE WITH OR IMMEDIATELY FOLLOWING A MEAL 90 tablet 0  . potassium chloride (KLOR-CON) 8 MEQ tablet TAKE 2 TABLETS(16 MEQ) BY MOUTH DAILY 180 tablet 0  . rosuvastatin (CRESTOR) 5 MG tablet TAKE 1 TABLET(5 MG) BY MOUTH DAILY 90 tablet 0  . sitaGLIPtin (JANUVIA) 50 MG tablet Take 1 tablet (50 mg total) by mouth daily. 90 tablet 3  . tadalafil (CIALIS) 20 MG tablet  Take 20 mg by mouth daily as needed for erectile dysfunction.     No current facility-administered medications on file prior to visit.    Review of Systems Constitutional: Negative for other unusual diaphoresis, sweats, appetite or weight changes HENT: Negative for other worsening hearing loss, ear pain, facial swelling, mouth sores or neck stiffness.   Eyes: Negative for other worsening pain, redness or other visual disturbance.  Respiratory: Negative for other stridor or swelling Cardiovascular: Negative for other palpitations or other chest pain  Gastrointestinal: Negative for worsening diarrhea or loose stools, blood in stool, distention or other pain Genitourinary: Negative for hematuria, flank pain or other change in urine volume.  Musculoskeletal: Negative for myalgias or other joint swelling.  Skin: Negative for other color change, or other wound or worsening drainage.  Neurological: Negative for other syncope or numbness. Hematological: Negative for other adenopathy or swelling Psychiatric/Behavioral: Negative for hallucinations, other worsening agitation, SI, self-injury, or new decreased concentration All other system neg per pt    Objective:   Physical Exam BP 130/84   Pulse 90   Temp 98.6 F (37 C) (Oral)   Ht 5\' 9"  (1.753 m)   Wt 248 lb (112.5 kg)   SpO2 96%   BMI 36.62 kg/m  VS noted,  Constitutional: Pt is oriented to person, place, and time. Appears well-developed and well-nourished, in no significant distress and comfortable Head: Normocephalic and atraumatic  Eyes: Conjunctivae and EOM are normal. Pupils are equal, round, and reactive to light Right Ear: External ear normal without discharge Left Ear: External ear normal without discharge Nose: Nose without discharge or deformity Mouth/Throat: Oropharynx is without other ulcerations and moist  Neck: Normal range of motion. Neck supple. No JVD present. No tracheal deviation present or significant neck LA or  mass Cardiovascular: Normal rate, regular rhythm, normal heart sounds and intact distal pulses.   Pulmonary/Chest: WOB normal and breath sounds without rales or wheezing  Abdominal: Soft. Bowel sounds are normal. NT. No HSM  Musculoskeletal: Normal range of motion. Exhibits no edema, left hip NT with FROM Lymphadenopathy: Has no other cervical adenopathy.  Neurological: Pt is alert and oriented to person, place, and time. Pt has normal reflexes. No cranial nerve deficit. Motor grossly intact, Gait intact Skin: Skin is warm and dry. No rash noted or new ulcerations Psychiatric:  Has normal mood and affect. Behavior is normal without agitation No other exam findings Lab Results  Component Value Date   WBC 10.0 02/05/2019   HGB 15.3 02/05/2019   HCT 46.9 02/05/2019   PLT 236.0 02/05/2019   GLUCOSE 105 (H) 02/05/2019   CHOL 108 12/26/2018   TRIG 79.0 12/26/2018   HDL 36.80 (L) 12/26/2018   LDLCALC 56 12/26/2018   ALT 12 02/05/2019   AST 13 02/05/2019   NA 141 02/05/2019   K 3.8 02/05/2019   CL 102 02/05/2019   CREATININE 1.29 02/05/2019   BUN 19 02/05/2019   CO2 29 02/05/2019   TSH 1.07 12/26/2018  PSA 2.30 02/05/2019   HGBA1C 7.4 (A) 12/26/2018   MICROALBUR 20.0 (H) 02/05/2019      Assessment & Plan:

## 2019-02-05 NOTE — Assessment & Plan Note (Addendum)
etilogy unclear, exam benign, declines film today, to f/u with sport med in this office  In addition to the time spent performing CPE, I spent an additional 25 minutes face to face,in which greater than 50% of this time was spent in counseling and coordination of care for patient's acute illness as documented, including the differential dx, treatment, further evaluation and other management of left groin pain, uncontrolled HTN, CKD and DM

## 2019-02-05 NOTE — Patient Instructions (Addendum)
Ok to change the atacand HCT to 32/12.5 mg daily  Please continue all other medications as before, and refills have been done if requested.  Please have the pharmacy call with any other refills you may need.  Please continue your efforts at being more active, low cholesterol diet, and weight control.  You are otherwise up to date with prevention measures today.  Please keep your appointments with your specialists as you may have planned - Dr Loanne Drilling next month, and consider Dr Smith/sports medicine in this office for the left groin pain  Please go to the LAB in the Basement (turn left off the elevator) for the tests to be done today  You will be contacted by phone if any changes need to be made immediately.  Otherwise, you will receive a letter about your results with an explanation, but please check with MyChart first.  Please remember to sign up for MyChart if you have not done so, as this will be important to you in the future with finding out test results, communicating by private email, and scheduling acute appointments online when needed.  Please return in 6 months, or sooner if needed

## 2019-02-05 NOTE — Assessment & Plan Note (Signed)

## 2019-03-24 ENCOUNTER — Other Ambulatory Visit: Payer: Self-pay | Admitting: Endocrinology

## 2019-04-10 ENCOUNTER — Other Ambulatory Visit: Payer: Self-pay | Admitting: Endocrinology

## 2019-04-17 ENCOUNTER — Other Ambulatory Visit: Payer: Self-pay | Admitting: Endocrinology

## 2019-04-17 NOTE — Telephone Encounter (Signed)
Please advise if you would like this refill request sent to PCP

## 2019-04-17 NOTE — Telephone Encounter (Signed)
Per Dr. Ellison's request, I am forwarding you this refill request. Please refill if you deem appropriate. 

## 2019-04-17 NOTE — Telephone Encounter (Signed)
Please forward refill request to pt's primary care provider.   

## 2019-05-15 ENCOUNTER — Telehealth: Payer: Self-pay

## 2019-05-15 MED ORDER — AMLODIPINE BESYLATE 10 MG PO TABS: 10.0000 mg | ORAL_TABLET | Freq: Every day | ORAL | 1 refills | Status: DC

## 2019-05-15 NOTE — Telephone Encounter (Signed)
We did not receive a request to deny a medication. I will send in a refill now.   Copied from [place] [phone]. Topic: General - Other >> May 15, 2019  2:58 PM Pauline Good wrote: Reason for CRM: pt want to know why his  amLODipine (NORVASC) 10 MG tablet was denied

## 2019-06-24 ENCOUNTER — Telehealth: Payer: Self-pay

## 2019-06-24 MED ORDER — METOPROLOL SUCCINATE ER 50 MG PO TB24: ORAL_TABLET | ORAL | 2 refills | Status: DC

## 2019-06-24 NOTE — Telephone Encounter (Signed)
Done erx 

## 2019-06-30 ENCOUNTER — Other Ambulatory Visit: Payer: Self-pay

## 2019-06-30 ENCOUNTER — Encounter: Payer: Self-pay | Admitting: Endocrinology

## 2019-06-30 ENCOUNTER — Ambulatory Visit (INDEPENDENT_AMBULATORY_CARE_PROVIDER_SITE_OTHER): Payer: Medicare Other | Admitting: Endocrinology

## 2019-06-30 VITALS — BP 132/80 | HR 77 | Ht 69.0 in | Wt 254.2 lb

## 2019-06-30 DIAGNOSIS — E1121 Type 2 diabetes mellitus with diabetic nephropathy: Secondary | ICD-10-CM

## 2019-06-30 LAB — POCT GLYCOSYLATED HEMOGLOBIN (HGB A1C): Hemoglobin A1C: 7.2 % — AB (ref 4.0–5.6)

## 2019-06-30 MED ORDER — BROMOCRIPTINE MESYLATE 2.5 MG PO TABS: 1.2500 mg | ORAL_TABLET | Freq: Every day | ORAL | 11 refills | Status: DC

## 2019-06-30 NOTE — Progress Notes (Signed)
Subjective:    Patient ID: Roberto Rivera, male    DOB: 01-22-1951, 68 y.o.   MRN: [id number]  HPI Pt returns for f/u of diabetes mellitus: DM type: 2 Dx'ed: 2015. Complications: renal insuff Therapy: metformin DKA: never Severe hypoglycemia: never.  Pancreatitis: never Pancreatic imaging: never Other: he has never taken insulin; he declined Januvia, due to cost.  Interval history:  pt states he feels well in general.  He says cbg's are in the low to mid-100's.   Past Medical History:  Diagnosis Date  . ALLERGIC RHINITIS 04/01/2007  . Diabetes mellitus   . DIABETES MELLITUS, TYPE II 04/01/2007  . 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 Hypercholesteremia   . HYPERCHOLESTEROLEMIA 02/07/2008  . Hypertension   . HYPERTENSION 04/01/2007  . MICROCYTOSIS 02/07/2008  . OSTEOARTHRITIS, SPINE 02/07/2008  . THYROID CYST 02/07/2008    No past surgical history on file.  Social History   Socioeconomic History  . Marital status: Married    Spouse name: Not on file  . Number of children: Not on file  . Years of education: Not on file  . Highest education level: Not on file  Occupational History  . Occupation: Theatre stage manager: Hydrographic surveyor  Social Needs  . Financial resource strain: Not on file  . Food insecurity    Worry: Not on file    Inability: Not on file  . Transportation needs    Medical: Not on file    Non-medical: Not on file  Tobacco Use  . Smoking status: Current Every Day Smoker    Packs/day: 1.00    Types: Cigarettes  . Smokeless tobacco: Never Used  Substance and Sexual Activity  . Alcohol use: No    Alcohol/week: 0.0 standard drinks  . Drug use: No  . Sexual activity: Not on file  Lifestyle  . Physical activity    Days per week: Not on file    Minutes per session: Not on file  . Stress: Not on file  Relationships  . Social Musician on phone: Not on file    Gets together: Not on file    Attends religious service: Not on file    Active  member of club or organization: Not on file    Attends meetings of clubs or organizations: Not on file    Relationship status: Not on file  . Intimate partner violence    Fear of current or ex partner: Not on file    Emotionally abused: Not on file    Physically abused: Not on file    Forced sexual activity: Not on file  Other Topics Concern  . Not on file  Social History Narrative   Divorced x many years    Current Outpatient Medications on File Prior to Visit  Medication Sig Dispense Refill  . Accu-Chek FastClix Lancets MISC USE TO CHECK BLOOD SUGAR AS DIRECTED ONCE DAILY 102 each 2  . ACCU-CHEK GUIDE test strip USE AS DIRECTED TO TEST BLOOD SUGAR ONCE DAILY 100 strip 0  . amLODipine (NORVASC) 10 MG tablet Take 1 tablet (10 mg total) by mouth daily. 90 tablet 1  . Blood Glucose Monitoring Suppl (ACCU-CHEK AVIVA) device Use to monitor glucose levels daily; E11.21 1 each 0  . candesartan-hydrochlorothiazide (ATACAND HCT) 32-12.5 MG tablet Take 1 tablet by mouth daily. 90 tablet 3  . metFORMIN (GLUCOPHAGE-XR) 500 MG 24 hr tablet TAKE 1 TABLET BY MOUTH EVERY DAY WITH BREAKFAST 90 tablet 0  .  metoprolol succinate (TOPROL-XL) 50 MG 24 hr tablet TAKE 1 TABLET BY MOUTH DAILY. TAKE WITH OR IMMEDIATELY FOLLOWING A MEAL 90 tablet 2  . potassium chloride (KLOR-CON) 8 MEQ tablet TAKE 2 TABLETS(16 MEQ) BY MOUTH DAILY 180 tablet 3  . rosuvastatin (CRESTOR) 5 MG tablet TAKE 1 TABLET(5 MG) BY MOUTH DAILY 90 tablet 0  . tadalafil (CIALIS) 20 MG tablet Take 20 mg by mouth daily as needed for erectile dysfunction.     No current facility-administered medications on file prior to visit.     No Known Allergies  Family History  Problem Relation Age of Onset  . Cancer Mother        had uncertain type of cancer    BP 132/80 (BP Location: Right Arm, Patient Position: Sitting, Cuff Size: Large)   Pulse 77   Ht 5\' 9"  (1.753 m)   Wt 254 lb 3.2 oz (115.3 kg)   SpO2 97%   BMI 37.54 kg/m    Review  of Systems He denies hypoglycemia.  He has gained weight. Dry skin on the feet persists.  He says lotrisone is too expensive.      Objective:   Physical Exam VITAL SIGNS:  See vs page GENERAL: no distress Pulses: dorsalis pedis intact bilat.   MSK: no deformity of the feet CV: 1+ bilat leg edema Skin:  no ulcer on the feet, but the skin is dry and scaly.  normal color and temp on the feet. Neuro: sensation is intact to touch on the feet  Lab Results  Component Value Date   HGBA1C 7.2 (A) 06/30/2019   Lab Results  Component Value Date   CREATININE 1.29 02/05/2019   BUN 19 02/05/2019   NA 141 02/05/2019   K 3.8 02/05/2019   CL 102 02/05/2019   CO2 29 02/05/2019       Assessment & Plan:  Cracking of the feet, persistent Insulin-requiring type 2 DM, with renal insuff: he needs increased rx, if it can be done with a regimen that avoids or minimizes hypoglycemia. Edema: This limits rx options  Patient Instructions  Please continue the same other medications. For the cracking skin on you feet, try clotrimazole cream twice a day (non-prescription). Please add "bromocriptine," to help your blood sugar. It has possible side effects of nausea and dizziness.  These go away with time.  You can avoid these by taking it at bedtime.    Please come back for a follow-up appointment in 4 months.

## 2019-06-30 NOTE — Patient Instructions (Addendum)
Please continue the same other medications. For the cracking skin on you feet, try clotrimazole cream twice a day (non-prescription). Please add "bromocriptine," to help your blood sugar. It has possible side effects of nausea and dizziness.  These go away with time.  You can avoid these by taking it at bedtime.    Please come back for a follow-up appointment in 4 months.

## 2019-07-08 ENCOUNTER — Telehealth: Payer: Self-pay

## 2019-07-08 MED ORDER — REPAGLINIDE 0.5 MG PO TABS: 0.5000 mg | ORAL_TABLET | Freq: Every day | ORAL | 11 refills | Status: DC

## 2019-07-08 NOTE — Telephone Encounter (Signed)
Instead, please add repaglinide, 1 pill with supper.  I have sent a prescription to your pharmacy.  I'll see you next time.

## 2019-07-08 NOTE — Telephone Encounter (Signed)
repaglinide (PRANDIN) 0.5 MG tablet 30 tablet 11 07/08/2019    Sig - Route: Take 1 tablet (0.5 mg total) by mouth daily with supper. - Oral   Sent to pharmacy as: repaglinide (PRANDIN) 0.5 MG tablet   E-Prescribing Status: Receipt confirmed by pharmacy (07/08/2019 12:09 PM EST)    Called pt and informed him about Dr. Cordelia Pen new orders above. Verbalized acceptance and understanding.

## 2019-07-08 NOTE — Telephone Encounter (Signed)
Patient called in stating that bromocriptine (PARLODEL) 2.5 MG tablet. Is too expensive for him is there something different that's more affordable for him     Please call and advise

## 2019-07-08 NOTE — Telephone Encounter (Signed)
Please advise 

## 2019-07-09 ENCOUNTER — Other Ambulatory Visit: Payer: Self-pay | Admitting: Endocrinology

## 2019-08-26 ENCOUNTER — Ambulatory Visit (INDEPENDENT_AMBULATORY_CARE_PROVIDER_SITE_OTHER): Payer: Medicare Other | Admitting: Internal Medicine

## 2019-08-26 ENCOUNTER — Other Ambulatory Visit: Payer: Self-pay

## 2019-08-26 ENCOUNTER — Encounter: Payer: Self-pay | Admitting: Internal Medicine

## 2019-08-26 DIAGNOSIS — M545 Low back pain, unspecified: Secondary | ICD-10-CM | POA: Insufficient documentation

## 2019-08-26 DIAGNOSIS — E1121 Type 2 diabetes mellitus with diabetic nephropathy: Secondary | ICD-10-CM

## 2019-08-26 DIAGNOSIS — I10 Essential (primary) hypertension: Secondary | ICD-10-CM | POA: Diagnosis not present

## 2019-08-26 MED ORDER — MELOXICAM 15 MG PO TABS: 15.0000 mg | ORAL_TABLET | Freq: Every day | ORAL | 3 refills | Status: DC | PRN

## 2019-08-26 MED ORDER — TIZANIDINE HCL 2 MG PO CAPS: 2.0000 mg | ORAL_CAPSULE | Freq: Three times a day (TID) | ORAL | 1 refills | Status: DC | PRN

## 2019-08-26 NOTE — Patient Instructions (Signed)
Please take all new medication as prescribed - the anti-inflammatory, and muscle relaxer as needed  Please continue all other medications as before, and refills have been done if requested.  Please have the pharmacy call with any other refills you may need.  Please keep your appointments with your specialists as you may have planned  Please consider starting regular walking up to 2 miles per day, that will strengthen the back, and help keep sugar and weight down

## 2019-08-26 NOTE — Progress Notes (Signed)
Subjective:    Patient ID: Roberto Rivera, male    DOB: [birth year], 69 y.o.   MRN: [id number]  HPI  Here to f/u; overall doing ok,  Pt denies chest pain, increasing sob or doe, wheezing, orthopnea, PND, increased LE swelling, palpitations, dizziness or syncope.  Pt denies new neurological symptoms such as new headache, or facial or extremity weakness or numbness.  Pt denies polydipsia, polyuria, or low sugar episode.  Pt states overall good compliance with meds, mostly trying to follow appropriate diet, with wt overall stable,  but little exercise however. Also with 1 wk right low back pain, mod to occas severe, intermittent, worse to stand up or bend, some with walking, but worst to stand > 15 min, no radicular symptoms, sitting makes better, does have some raidiation at times to the right groin.  Denies urinary symptoms such as dysuria, frequency, urgency, flank pain, hematuria or n/v, fever, chills.  Denies worsening reflux, abd pain, dysphagia, n/v, bowel change or blood. Past Medical History:  Diagnosis Date  . ALLERGIC RHINITIS 04/01/2007  . Diabetes mellitus   . DIABETES MELLITUS, TYPE II 04/01/2007  . 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 Hypercholesteremia   . HYPERCHOLESTEROLEMIA 02/07/2008  . Hypertension   . HYPERTENSION 04/01/2007  . MICROCYTOSIS 02/07/2008  . OSTEOARTHRITIS, SPINE 02/07/2008  . THYROID CYST 02/07/2008   No past surgical history on file.  reports that he has been smoking cigarettes. He has been smoking about 1.00 pack per day. He has never used smokeless tobacco. He reports that he does not drink alcohol or use drugs. family history includes Cancer in his mother. No Known Allergies Current Outpatient Medications on File Prior to Visit  Medication Sig Dispense Refill  . Accu-Chek FastClix Lancets MISC USE TO CHECK BLOOD SUGAR AS DIRECTED ONCE DAILY 102 each 2  . ACCU-CHEK GUIDE test strip USE AS DIRECTED TO TEST BLOOD SUGAR ONCE DAILY 100 strip 0  . amLODipine  (NORVASC) 10 MG tablet Take 1 tablet (10 mg total) by mouth daily. 90 tablet 1  . Blood Glucose Monitoring Suppl (ACCU-CHEK AVIVA) device Use to monitor glucose levels daily; E11.21 1 each 0  . candesartan-hydrochlorothiazide (ATACAND HCT) 32-12.5 MG tablet Take 1 tablet by mouth daily. 90 tablet 3  . metFORMIN (GLUCOPHAGE-XR) 500 MG 24 hr tablet TAKE 1 TABLET BY MOUTH EVERY DAY WITH BREAKFAST 90 tablet 0  . metoprolol succinate (TOPROL-XL) 50 MG 24 hr tablet TAKE 1 TABLET BY MOUTH DAILY. TAKE WITH OR IMMEDIATELY FOLLOWING A MEAL 90 tablet 2  . potassium chloride (KLOR-CON) 8 MEQ tablet TAKE 2 TABLETS(16 MEQ) BY MOUTH DAILY 180 tablet 3  . repaglinide (PRANDIN) 0.5 MG tablet Take 1 tablet (0.5 mg total) by mouth daily with supper. 30 tablet 11  . rosuvastatin (CRESTOR) 5 MG tablet TAKE 1 TABLET(5 MG) BY MOUTH DAILY 90 tablet 0  . tadalafil (CIALIS) 20 MG tablet Take 20 mg by mouth daily as needed for erectile dysfunction.     No current facility-administered medications on file prior to visit.   Review of Systems  Constitutional: Negative for other unusual diaphoresis or sweats HENT: Negative for ear discharge or swelling Eyes: Negative for other worsening visual disturbances Respiratory: Negative for stridor or other swelling  Gastrointestinal: Negative for worsening distension or other blood Genitourinary: Negative for retention or other urinary change Musculoskeletal: Negative for other MSK pain or swelling Skin: Negative for color change or other new lesions Neurological: Negative for worsening tremors and other numbness  Psychiatric/Behavioral:  Negative for worsening agitation or other fatigue All otherwise neg per pt     Objective:   Physical Exam BP 122/78   Pulse (!) 103   Temp 98.9 F (37.2 C) (Oral)   Resp 12   Ht 5\' 9"  (1.753 m)   Wt 255 lb (115.7 kg)   SpO2 99%   BMI 37.66 kg/m  VS noted,  Constitutional: Pt appears in NAD HENT: Head: NCAT.  Right Ear: External  ear normal.  Left Ear: External ear normal.  Eyes: . Pupils are equal, round, and reactive to light. Conjunctivae and EOM are normal Nose: without d/c or deformity Neck: Neck supple. Gross normal ROM Cardiovascular: Normal rate and regular rhythm.   Pulmonary/Chest: Effort normal and breath sounds without rales or wheezing.  Abd:  Soft, NT, ND, + BS, no organomegaly Spine nontender rright lumbar paravertebral tender + Neurological: Pt is alert. At baseline orientation, motor grossly intact Skin: Skin is warm. No rashes, other new lesions, no LE edema Psychiatric: Pt behavior is normal without agitation  All otherwise neg per pt Lab Results  Component Value Date   WBC 10.0 02/05/2019   HGB 15.3 02/05/2019   HCT 46.9 02/05/2019   PLT 236.0 02/05/2019   GLUCOSE 105 (H) 02/05/2019   CHOL 108 12/26/2018   TRIG 79.0 12/26/2018   HDL 36.80 (L) 12/26/2018   LDLCALC 56 12/26/2018   ALT 12 02/05/2019   AST 13 02/05/2019   NA 141 02/05/2019   K 3.8 02/05/2019   CL 102 02/05/2019   CREATININE 1.29 02/05/2019   BUN 19 02/05/2019   CO2 29 02/05/2019   TSH 1.07 12/26/2018   PSA 2.30 02/05/2019   HGBA1C 7.2 (A) 06/30/2019   MICROALBUR 20.0 (H) 02/05/2019         Assessment & Plan:

## 2019-08-27 ENCOUNTER — Encounter: Payer: Self-pay | Admitting: Internal Medicine

## 2019-08-27 LAB — URINALYSIS, ROUTINE W REFLEX MICROSCOPIC
Bilirubin Urine: NEGATIVE
Ketones, ur: NEGATIVE
Leukocytes,Ua: NEGATIVE
Nitrite: NEGATIVE
RBC / HPF: NONE SEEN (ref 0–?)
Specific Gravity, Urine: 1.02 (ref 1.000–1.030)
Total Protein, Urine: 30 — AB
Urine Glucose: NEGATIVE
Urobilinogen, UA: 0.2 (ref 0.0–1.0)
WBC, UA: NONE SEEN (ref 0–?)
pH: 5.5 (ref 5.0–8.0)

## 2019-08-31 ENCOUNTER — Encounter: Payer: Self-pay | Admitting: Internal Medicine

## 2019-08-31 NOTE — Assessment & Plan Note (Signed)
stable overall by history and exam, recent data reviewed with pt, and pt to continue medical treatment as before,  to f/u any worsening symptoms or concerns  

## 2019-08-31 NOTE — Assessment & Plan Note (Signed)
Mild, c/w msk strain, for mobic prn, tizanidine prn, check UA and  to f/u any worsening symptoms or concerns

## 2019-09-19 ENCOUNTER — Other Ambulatory Visit: Payer: Self-pay

## 2019-09-19 DIAGNOSIS — E1121 Type 2 diabetes mellitus with diabetic nephropathy: Secondary | ICD-10-CM

## 2019-09-19 MED ORDER — METFORMIN HCL ER 500 MG PO TB24: 500.0000 mg | ORAL_TABLET | Freq: Every day | ORAL | 0 refills | Status: DC

## 2019-10-27 ENCOUNTER — Other Ambulatory Visit: Payer: Self-pay

## 2019-10-29 ENCOUNTER — Encounter: Payer: Self-pay | Admitting: Endocrinology

## 2019-10-29 ENCOUNTER — Other Ambulatory Visit: Payer: Self-pay

## 2019-10-29 ENCOUNTER — Ambulatory Visit (INDEPENDENT_AMBULATORY_CARE_PROVIDER_SITE_OTHER): Payer: Medicare Other | Admitting: Endocrinology

## 2019-10-29 VITALS — BP 118/78 | HR 83 | Ht 69.0 in | Wt 261.0 lb

## 2019-10-29 DIAGNOSIS — E1121 Type 2 diabetes mellitus with diabetic nephropathy: Secondary | ICD-10-CM

## 2019-10-29 LAB — POCT GLYCOSYLATED HEMOGLOBIN (HGB A1C): Hemoglobin A1C: 7.1 % — AB (ref 4.0–5.6)

## 2019-10-29 MED ORDER — REPAGLINIDE 1 MG PO TABS: 1.0000 mg | ORAL_TABLET | Freq: Every day | ORAL | 3 refills | Status: DC

## 2019-10-29 NOTE — Patient Instructions (Addendum)
Please continue the same other medications. I have sent a prescription to your pharmacy, to increase the repaglinide.    Please come back for a follow-up appointment in 4 months.

## 2019-10-29 NOTE — Progress Notes (Signed)
Subjective:    Patient ID: Roberto Rivera, male    DOB: [birth year], 69 y.o.   MRN: [id number]  HPI Pt returns for f/u of diabetes mellitus: DM type: 2 Dx'ed: 2015. Complications: renal insuff Therapy: 2 oral meds DKA: never Severe hypoglycemia: never.  Pancreatitis: never Pancreatic imaging: never SDOH: he declines name brand meds, and also bromocriptine, due to cost.   Other: he has never taken insulin.  Interval history:  pt states he feels well in general.  He says cbg's are in the mid to high-100's.  He takes meds as rx'ed.   Past Medical History:  Diagnosis Date  . ALLERGIC RHINITIS 04/01/2007  . Diabetes mellitus   . DIABETES MELLITUS, TYPE II 04/01/2007  . 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 Hypercholesteremia   . HYPERCHOLESTEROLEMIA 02/07/2008  . Hypertension   . HYPERTENSION 04/01/2007  . MICROCYTOSIS 02/07/2008  . OSTEOARTHRITIS, SPINE 02/07/2008  . THYROID CYST 02/07/2008    No past surgical history on file.  Social History   Socioeconomic History  . Marital status: Married    Spouse name: Not on file  . Number of children: Not on file  . Years of education: Not on file  . Highest education level: Not on file  Occupational History  . 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 Smoking status: Current Every Day Smoker    Packs/day: 1.00    Types: Cigarettes  . Smokeless tobacco: Never Used  Substance and Sexual Activity  . Alcohol use: No    Alcohol/week: 0.0 standard drinks  . Drug use: No  . Sexual activity: Not on file  Other Topics Concern  . Not on file  Social History Narrative   Divorced x many years   Social Determinants of Health   Financial Resource Strain:   . Difficulty of Paying Living Expenses:   Food Insecurity:   . Worried About Programme researcher, broadcasting/film/video in the Last Year:   . Barista in the Last Year:   Transportation Needs:   . Freight forwarder (Medical):   Marland Kitchen Lack of Transportation (Non-Medical):    Physical Activity:   . Days of Exercise per Week:   . Minutes of Exercise per Session:   Stress:   . Feeling of Stress :   Social Connections:   . Frequency of Communication with Friends and Family:   . Frequency of Social Gatherings with Friends and Family:   . Attends Religious Services:   . Active Member of Clubs or Organizations:   . Attends Banker Meetings:   Marland Kitchen Marital Status:   Intimate Partner Violence:   . Fear of Current or Ex-Partner:   . Emotionally Abused:   Marland Kitchen Physically Abused:   . Sexually Abused:     Current Outpatient Medications on File Prior to Visit  Medication Sig Dispense Refill  . Accu-Chek FastClix Lancets MISC USE TO CHECK BLOOD SUGAR AS DIRECTED ONCE DAILY 102 each 2  . ACCU-CHEK GUIDE test strip USE AS DIRECTED TO TEST BLOOD SUGAR ONCE DAILY 100 strip 0  . amLODipine (NORVASC) 10 MG tablet Take 1 tablet (10 mg total) by mouth daily. 90 tablet 1  . Blood Glucose Monitoring Suppl (ACCU-CHEK AVIVA) device Use to monitor glucose levels daily; E11.21 1 each 0  . candesartan-hydrochlorothiazide (ATACAND HCT) 32-12.5 MG tablet Take 1 tablet by mouth daily. 90 tablet 3  . meloxicam (MOBIC) 15 MG tablet Take 1 tablet (15 mg total)  by mouth daily as needed for pain. 30 tablet 3  . metFORMIN (GLUCOPHAGE-XR) 500 MG 24 hr tablet Take 1 tablet (500 mg total) by mouth daily with breakfast. 90 tablet 0  . metoprolol succinate (TOPROL-XL) 50 MG 24 hr tablet TAKE 1 TABLET BY MOUTH DAILY. TAKE WITH OR IMMEDIATELY FOLLOWING A MEAL 90 tablet 2  . potassium chloride (KLOR-CON) 8 MEQ tablet TAKE 2 TABLETS(16 MEQ) BY MOUTH DAILY 180 tablet 3  . rosuvastatin (CRESTOR) 5 MG tablet TAKE 1 TABLET(5 MG) BY MOUTH DAILY 90 tablet 0  . tadalafil (CIALIS) 20 MG tablet Take 20 mg by mouth daily as needed for erectile dysfunction.    . tizanidine (ZANAFLEX) 2 MG capsule Take 1 capsule (2 mg total) by mouth 3 (three) times daily as needed for muscle spasms. 60 capsule 1   No  current facility-administered medications on file prior to visit.    No Known Allergies  Family History  Problem Relation Age of Onset  . Cancer Mother        had uncertain type of cancer    BP 118/78 (BP Location: Left Arm, Patient Position: Sitting, Cuff Size: Large)   Pulse 83   Ht 5\' 9"  (1.753 m)   Wt 261 lb (118.4 kg)   SpO2 94%   BMI 38.54 kg/m    Review of Systems He denies hypoglycemia    Objective:   Physical Exam VITAL SIGNS:  See vs page GENERAL: no distress Pulses: dorsalis pedis intact bilat.   MSK: no deformity of the feet CV: 1+ bilat leg edema, and bilat vv's Skin:  no ulcer on the feet.  normal color and temp on the feet. Neuro: sensation is intact to touch on the feet Ext: there is bilateral onychomycosis of the toenails  Lab Results  Component Value Date   CREATININE 1.29 02/05/2019   BUN 19 02/05/2019   NA 141 02/05/2019   K 3.8 02/05/2019   CL 102 02/05/2019   CO2 29 02/05/2019   Lab Results  Component Value Date   HGBA1C 7.1 (A) 10/29/2019        Assessment & Plan:  Type 2 DM: he would benefit from increased rx, if it can be done with a regimen that avoids or minimizes hypoglycemia.  He declines to increase frequency of repaglinide Renal insuff: repaglinide is preferred over SU Edema: This limits rx options  Patient Instructions  Please continue the same other medications. I have sent a prescription to your pharmacy, to increase the repaglinide.    Please come back for a follow-up appointment in 4 months.

## 2019-11-02 ENCOUNTER — Other Ambulatory Visit: Payer: Self-pay | Admitting: Internal Medicine

## 2019-11-02 NOTE — Telephone Encounter (Signed)
Please refill as per office routine med refill policy (all routine meds refilled for 3 mo or monthly per pt preference up to one year from last visit, then month to month grace period for 3 mo, then further med refills will have to be denied)  

## 2019-11-09 ENCOUNTER — Other Ambulatory Visit: Payer: Self-pay | Admitting: Endocrinology

## 2019-11-09 NOTE — Telephone Encounter (Signed)
Please forward refill request to pt's primary care provider.   

## 2019-11-10 NOTE — Telephone Encounter (Signed)
Per Dr. Ellison's request, I am forwarding this refill request. Please review and refill if appropriate.  

## 2019-11-10 NOTE — Telephone Encounter (Signed)
Please refill as per office routine med refill policy (all routine meds refilled for 3 mo or monthly per pt preference up to one year from last visit, then month to month grace period for 3 mo, then further med refills will have to be denied)  

## 2019-12-17 DIAGNOSIS — E119 Type 2 diabetes mellitus without complications: Secondary | ICD-10-CM | POA: Diagnosis not present

## 2019-12-17 LAB — HM DIABETES EYE EXAM

## 2019-12-18 ENCOUNTER — Other Ambulatory Visit: Payer: Self-pay | Admitting: Internal Medicine

## 2019-12-18 ENCOUNTER — Other Ambulatory Visit: Payer: Self-pay | Admitting: Endocrinology

## 2019-12-18 DIAGNOSIS — E1121 Type 2 diabetes mellitus with diabetic nephropathy: Secondary | ICD-10-CM

## 2020-01-16 ENCOUNTER — Other Ambulatory Visit: Payer: Self-pay

## 2020-01-20 ENCOUNTER — Other Ambulatory Visit: Payer: Self-pay

## 2020-01-20 ENCOUNTER — Ambulatory Visit (INDEPENDENT_AMBULATORY_CARE_PROVIDER_SITE_OTHER): Payer: Medicare Other | Admitting: Endocrinology

## 2020-01-20 ENCOUNTER — Encounter: Payer: Self-pay | Admitting: Endocrinology

## 2020-01-20 VITALS — BP 158/98 | HR 94 | Ht 69.0 in | Wt 257.0 lb

## 2020-01-20 DIAGNOSIS — E1121 Type 2 diabetes mellitus with diabetic nephropathy: Secondary | ICD-10-CM

## 2020-01-20 LAB — POCT GLYCOSYLATED HEMOGLOBIN (HGB A1C): Hemoglobin A1C: 7.2 % — AB (ref 4.0–5.6)

## 2020-01-20 NOTE — Progress Notes (Signed)
Subjective:    Patient ID: Roberto Rivera, male    DOB: 02-03-1951, 69 y.o.   MRN: [id number]  HPI Pt returns for f/u of diabetes mellitus: DM type: 2 Dx'ed: 2015. Complications: renal insuff Therapy: 2 oral meds DKA: never Severe hypoglycemia: never.  Pancreatitis: never Pancreatic imaging: never SDOH: he declines name brand meds, and also bromocriptine, due to cost.   Other: he has never taken insulin.  Interval history:  pt states he feels well in general.  He says cbg's are in the 100's.  He takes meds as rx'ed.   Past Medical History:  Diagnosis Date  . ALLERGIC RHINITIS 04/01/2007  . Diabetes mellitus   . DIABETES MELLITUS, TYPE II 04/01/2007  . 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 Hypercholesteremia   . HYPERCHOLESTEROLEMIA 02/07/2008  . Hypertension   . HYPERTENSION 04/01/2007  . MICROCYTOSIS 02/07/2008  . OSTEOARTHRITIS, SPINE 02/07/2008  . THYROID CYST 02/07/2008    No past surgical history on file.  Social History   Socioeconomic History  . Marital status: Married    Spouse name: Not on file  . Number of children: Not on file  . Years of education: Not on file  . Highest education level: Not on file  Occupational History  . 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 Smoking status: Current Every Day Smoker    Packs/day: 1.00    Types: Cigarettes  . Smokeless tobacco: Never Used  Substance and Sexual Activity  . Alcohol use: No    Alcohol/week: 0.0 standard drinks  . Drug use: No  . Sexual activity: Not on file  Other Topics Concern  . Not on file  Social History Narrative   Divorced x many years   Social Determinants of Health   Financial Resource Strain:   . Difficulty of Paying Living Expenses:   Food Insecurity:   . Worried About Charity fundraiser in the Last Year:   . Arboriculturist in the Last Year:   Transportation Needs:   . Film/video editor (Medical):   Marland Kitchen Lack of Transportation (Non-Medical):   Physical  Activity:   . Days of Exercise per Week:   . Minutes of Exercise per Session:   Stress:   . Feeling of Stress :   Social Connections:   . Frequency of Communication with Friends and Family:   . Frequency of Social Gatherings with Friends and Family:   . Attends Religious Services:   . Active Member of Clubs or Organizations:   . Attends Archivist Meetings:   Marland Kitchen Marital Status:   Intimate Partner Violence:   . Fear of Current or Ex-Partner:   . Emotionally Abused:   Marland Kitchen Physically Abused:   . Sexually Abused:     Current Outpatient Medications on File Prior to Visit  Medication Sig Dispense Refill  . Accu-Chek FastClix Lancets MISC USE TO CHECK BLOOD SUGAR AS DIRECTED ONCE DAILY 102 each 2  . ACCU-CHEK GUIDE test strip USE AS DIRECTED TO TEST BLOOD SUGAR ONCE DAILY 100 strip 0  . amLODipine (NORVASC) 10 MG tablet TAKE 1 TABLET(10 MG) BY MOUTH DAILY 90 tablet 1  . candesartan-hydrochlorothiazide (ATACAND HCT) 32-12.5 MG tablet Take 1 tablet by mouth daily. Annual appt due in June must see provider for future refills 90 tablet 0  . meloxicam (MOBIC) 15 MG tablet Take 1 tablet (15 mg total) by mouth daily as needed for pain. 30 tablet 3  .  metFORMIN (GLUCOPHAGE-XR) 500 MG 24 hr tablet TAKE 1 TABLET(500 MG) BY MOUTH DAILY WITH BREAKFAST 90 tablet 0  . metoprolol succinate (TOPROL-XL) 50 MG 24 hr tablet TAKE 1 TABLET BY MOUTH DAILY. TAKE WITH OR IMMEDIATELY FOLLOWING A MEAL 90 tablet 2  . potassium chloride (KLOR-CON) 8 MEQ tablet TAKE 2 TABLETS(16 MEQ) BY MOUTH DAILY 180 tablet 3  . repaglinide (PRANDIN) 1 MG tablet Take 1 tablet (1 mg total) by mouth daily with supper. 90 tablet 3  . rosuvastatin (CRESTOR) 5 MG tablet TAKE 1 TABLET(5 MG) BY MOUTH DAILY 90 tablet 3  . tadalafil (CIALIS) 20 MG tablet Take 20 mg by mouth daily as needed for erectile dysfunction.    . tizanidine (ZANAFLEX) 2 MG capsule Take 1 capsule (2 mg total) by mouth 3 (three) times daily as needed for muscle  spasms. 60 capsule 1  . Blood Glucose Monitoring Suppl (ACCU-CHEK AVIVA) device Use to monitor glucose levels daily; E11.21 1 each 0   No current facility-administered medications on file prior to visit.    No Known Allergies  Family History  Problem Relation Age of Onset  . Cancer Mother        had uncertain type of cancer    BP (!) 158/98   Pulse 94   Ht 5\' 9"  (1.753 m)   Wt 257 lb (116.6 kg)   SpO2 95%   BMI 37.95 kg/m    Review of Systems He denies hypoglycemia    Objective:   Physical Exam VITAL SIGNS:  See vs page GENERAL: no distress Pulses: dorsalis pedis intact bilat.   MSK: no deformity of the feet CV: 1+ bilat leg edema Skin:  no ulcer on the feet, but the skin is dry and scaly.  normal color and temp on the feet. Neuro: sensation is intact to touch on the feet.  Ext: there is bilateral onychomycosis of the toenails.    Lab Results  Component Value Date   HGBA1C 7.2 (A) 01/20/2020   Lab Results  Component Value Date   CREATININE 1.29 02/05/2019   BUN 19 02/05/2019   NA 141 02/05/2019   K 3.8 02/05/2019   CL 102 02/05/2019   CO2 29 02/05/2019        Assessment & Plan:  Type 2 DM: well-controlled HTN: is noted today Edema: This limits rx options  Patient Instructions  Your blood pressure is high today.  Please see your primary care provider soon, to have it rechecked Please continue the same medications. check your blood sugar once a day.  vary the time of day when you check, between before the 3 meals, and at bedtime.  also check if you have symptoms of your blood sugar being too high or too low.  please keep a record of the readings and bring it to your next appointment here (or you can bring the meter itself).  You can write it on any piece of paper.  please call 02/07/2019 sooner if your blood sugar goes below 70, or if you have a lot of readings over 200. Please come back for a follow-up appointment in 4 months.

## 2020-01-20 NOTE — Patient Instructions (Addendum)
Your blood pressure is high today.  Please see your primary care provider soon, to have it rechecked Please continue the same medications.   check your blood sugar once a day.  vary the time of day when you check, between before the 3 meals, and at bedtime.  also check if you have symptoms of your blood sugar being too high or too low.  please keep a record of the readings and bring it to your next appointment here (or you can bring the meter itself).  You can write it on any piece of paper.  please call us sooner if your blood sugar goes below 70, or if you have a lot of readings over 200.   Please come back for a follow-up appointment in 4 months.   

## 2020-01-28 ENCOUNTER — Ambulatory Visit (INDEPENDENT_AMBULATORY_CARE_PROVIDER_SITE_OTHER): Payer: Medicare Other | Admitting: Internal Medicine

## 2020-01-28 ENCOUNTER — Other Ambulatory Visit: Payer: Self-pay

## 2020-01-28 ENCOUNTER — Encounter: Payer: Self-pay | Admitting: Internal Medicine

## 2020-01-28 VITALS — BP 140/76 | HR 77 | Temp 98.3°F | Ht 69.0 in | Wt 257.0 lb

## 2020-01-28 DIAGNOSIS — I10 Essential (primary) hypertension: Secondary | ICD-10-CM | POA: Diagnosis not present

## 2020-01-28 DIAGNOSIS — E1121 Type 2 diabetes mellitus with diabetic nephropathy: Secondary | ICD-10-CM | POA: Diagnosis not present

## 2020-01-28 DIAGNOSIS — Z0000 Encounter for general adult medical examination without abnormal findings: Secondary | ICD-10-CM

## 2020-01-28 DIAGNOSIS — E559 Vitamin D deficiency, unspecified: Secondary | ICD-10-CM | POA: Diagnosis not present

## 2020-01-28 DIAGNOSIS — E538 Deficiency of other specified B group vitamins: Secondary | ICD-10-CM

## 2020-01-28 DIAGNOSIS — Z23 Encounter for immunization: Secondary | ICD-10-CM

## 2020-01-28 LAB — HEPATIC FUNCTION PANEL
ALT: 11 U/L (ref 0–53)
AST: 14 U/L (ref 0–37)
Albumin: 4 g/dL (ref 3.5–5.2)
Alkaline Phosphatase: 93 U/L (ref 39–117)
Bilirubin, Direct: 0.1 mg/dL (ref 0.0–0.3)
Total Bilirubin: 0.5 mg/dL (ref 0.2–1.2)
Total Protein: 6.7 g/dL (ref 6.0–8.3)

## 2020-01-28 LAB — LIPID PANEL
Cholesterol: 95 mg/dL (ref 0–200)
HDL: 31.4 mg/dL — ABNORMAL LOW (ref >39.00)
LDL Cholesterol: 51 mg/dL (ref 0–99)
NonHDL: 63.57
Total CHOL/HDL Ratio: 3
Triglycerides: 65 mg/dL (ref 0.0–149.0)
VLDL: 13 mg/dL (ref 0.0–40.0)

## 2020-01-28 LAB — TSH: TSH: 1.64 u[IU]/mL (ref 0.35–4.50)

## 2020-01-28 LAB — BASIC METABOLIC PANEL
BUN: 22 mg/dL (ref 6–23)
CO2: 30 mEq/L (ref 19–32)
Calcium: 9.5 mg/dL (ref 8.4–10.5)
Chloride: 102 mEq/L (ref 96–112)
Creatinine, Ser: 1.4 mg/dL (ref 0.40–1.50)
GFR: 60.93 mL/min (ref >60.00)
Glucose, Bld: 175 mg/dL — ABNORMAL HIGH (ref 70–99)
Potassium: 3.7 mEq/L (ref 3.5–5.1)
Sodium: 138 mEq/L (ref 135–145)

## 2020-01-28 LAB — CBC WITH DIFFERENTIAL/PLATELET
Basophils Absolute: 0 10*3/uL (ref 0.0–0.1)
Basophils Relative: 0.3 % (ref 0.0–3.0)
Eosinophils Absolute: 0.1 10*3/uL (ref 0.0–0.7)
Eosinophils Relative: 1.7 % (ref 0.0–5.0)
HCT: 44.9 % (ref 39.0–52.0)
Hemoglobin: 14.7 g/dL (ref 13.0–17.0)
Lymphocytes Relative: 38.6 % (ref 12.0–46.0)
Lymphs Abs: 3.2 10*3/uL (ref 0.7–4.0)
MCHC: 32.8 g/dL (ref 30.0–36.0)
MCV: 78.5 fl (ref 78.0–100.0)
Monocytes Absolute: 0.7 10*3/uL (ref 0.1–1.0)
Monocytes Relative: 8.4 % (ref 3.0–12.0)
Neutro Abs: 4.3 10*3/uL (ref 1.4–7.7)
Neutrophils Relative %: 51 % (ref 43.0–77.0)
Platelets: 246 10*3/uL (ref 150.0–400.0)
RBC: 5.72 Mil/uL (ref 4.22–5.81)
RDW: 16 % — ABNORMAL HIGH (ref 11.5–15.5)
WBC: 8.4 10*3/uL (ref 4.0–10.5)

## 2020-01-28 LAB — PSA: PSA: 1.64 ng/mL (ref 0.10–4.00)

## 2020-01-28 LAB — VITAMIN B12: Vitamin B-12: 317 pg/mL (ref 211–911)

## 2020-01-28 LAB — VITAMIN D 25 HYDROXY (VIT D DEFICIENCY, FRACTURES): VITD: 19.63 ng/mL — ABNORMAL LOW (ref 30.00–100.00)

## 2020-01-28 MED ORDER — METOPROLOL SUCCINATE ER 50 MG PO TB24: ORAL_TABLET | ORAL | 3 refills | Status: DC

## 2020-01-28 MED ORDER — CANDESARTAN CILEXETIL-HCTZ 32-25 MG PO TABS: ORAL_TABLET | ORAL | 3 refills | Status: DC

## 2020-01-28 MED ORDER — POTASSIUM CHLORIDE ER 8 MEQ PO TBCR: EXTENDED_RELEASE_TABLET | ORAL | 3 refills | Status: DC

## 2020-01-28 MED ORDER — ROSUVASTATIN CALCIUM 5 MG PO TABS: ORAL_TABLET | ORAL | 3 refills | Status: DC

## 2020-01-28 MED ORDER — AMLODIPINE BESYLATE 10 MG PO TABS: ORAL_TABLET | ORAL | 3 refills | Status: DC

## 2020-01-28 NOTE — Progress Notes (Signed)
Subjective:    Patient ID: Roberto Rivera, male    DOB: March 19, 1951, 69 y.o.   MRN: [id number]  HPI  Here for wellness and f/u;  Overall doing ok;  Pt denies Chest pain, worsening SOB, DOE, wheezing, orthopnea, PND, worsening LE edema, palpitations, dizziness or syncope.  Pt denies neurological change such as new headache, facial or extremity weakness.  Pt denies polydipsia, polyuria, or low sugar symptoms. Pt states overall good compliance with treatment and medications, good tolerability, and has been trying to follow appropriate diet.  Pt denies worsening depressive symptoms, suicidal ideation or panic. No fever, night sweats, wt loss, loss of appetite, or other constitutional symptoms.  Pt states good ability with ADL's, has low fall risk, home safety reviewed and adequate, no other significant changes in hearing or vision, and only occasionally active with exercise. Hesitates to take the covid vaccine as wife had right leg weakness after one dose, and advised to not do the second shot, from the doctor who worked on her spine. Has intermittent right foot and distal leg swelling and squishy foot, concerned it may be related to a higher dose of BB per Dr Loanne Drilling, bc he watched a youtube video from what he thought was a dotor who mentioned this.  .  BP has been persistently mild elevated as well, despite current meds.  Has been on higher HCT in the past, now on candesartan HCT 32/12.5. Past Medical History:  Diagnosis Date  . ALLERGIC RHINITIS 04/01/2007  . Diabetes mellitus   . DIABETES MELLITUS, TYPE II 04/01/2007  . 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 Hypercholesteremia   . HYPERCHOLESTEROLEMIA 02/07/2008  . Hypertension   . HYPERTENSION 04/01/2007  . MICROCYTOSIS 02/07/2008  . OSTEOARTHRITIS, SPINE 02/07/2008  . THYROID CYST 02/07/2008   History reviewed. No pertinent surgical history.  reports that he has been smoking cigarettes. He has been smoking about 1.00 pack per day. He has never used  smokeless tobacco. He reports that he does not drink alcohol and does not use drugs. family history includes Cancer in his mother. No Known Allergies Current Outpatient Medications on File Prior to Visit  Medication Sig Dispense Refill  . Accu-Chek FastClix Lancets MISC USE TO CHECK BLOOD SUGAR AS DIRECTED ONCE DAILY 102 each 2  . metFORMIN (GLUCOPHAGE-XR) 500 MG 24 hr tablet TAKE 1 TABLET(500 MG) BY MOUTH DAILY WITH BREAKFAST 90 tablet 0  . repaglinide (PRANDIN) 1 MG tablet Take 1 tablet (1 mg total) by mouth daily with supper. 90 tablet 3  . tadalafil (CIALIS) 20 MG tablet Take 20 mg by mouth daily as needed for erectile dysfunction.    . tizanidine (ZANAFLEX) 2 MG capsule Take 1 capsule (2 mg total) by mouth 3 (three) times daily as needed for muscle spasms. 60 capsule 1  . Blood Glucose Monitoring Suppl (ACCU-CHEK AVIVA) device Use to monitor glucose levels daily; E11.21 1 each 0   No current facility-administered medications on file prior to visit.   Review of Systems All otherwise neg per pt     Objective:   Physical Exam BP 140/76 (BP Location: Left Arm, Patient Position: Sitting, Cuff Size: Large)   Pulse 77   Temp 98.3 F (36.8 C) (Oral)   Ht 5\' 9"  (1.753 m)   Wt 257 lb (116.6 kg)   SpO2 96%   BMI 37.95 kg/m  VS noted,  Constitutional: Pt appears in NAD HENT: Head: NCAT.  Right Ear: External ear normal.  Left Ear: External ear normal.  Eyes: . Pupils are equal, round, and reactive to light. Conjunctivae and EOM are normal Nose: without d/c or deformity Neck: Neck supple. Gross normal ROM Cardiovascular: Normal rate and regular rhythm.   Pulmonary/Chest: Effort normal and breath sounds without rales or wheezing.  Abd:  Soft, NT, ND, + BS, no organomegaly Neurological: Pt is alert. At baseline orientation, motor grossly intact Skin: Skin is warm. No rashes, other new lesions, no LE edema Psychiatric: Pt behavior is normal without agitation  All otherwise neg per  pt  Lab Results  Component Value Date   WBC 8.4 01/28/2020   HGB 14.7 01/28/2020   HCT 44.9 01/28/2020   PLT 246.0 01/28/2020   GLUCOSE 175 (H) 01/28/2020   CHOL 95 01/28/2020   TRIG 65.0 01/28/2020   HDL 31.40 (L) 01/28/2020   LDLCALC 51 01/28/2020   ALT 11 01/28/2020   AST 14 01/28/2020   NA 138 01/28/2020   K 3.7 01/28/2020   CL 102 01/28/2020   CREATININE 1.40 01/28/2020   BUN 22 01/28/2020   CO2 30 01/28/2020   TSH 1.64 01/28/2020   PSA 1.64 01/28/2020   HGBA1C 7.2 (A) 01/20/2020   MICROALBUR 14.5 (H) 01/28/2020      Assessment & Plan:

## 2020-01-28 NOTE — Assessment & Plan Note (Signed)
Stable, to f/u endo

## 2020-01-28 NOTE — Patient Instructions (Addendum)
You had the Pneumovax pneumonia shot today  Ok to increase the candesartan - HCT to the 32/25 mg per day  Please continue all other medications as before, and refills have been done if requested.  Please have the pharmacy call with any other refills you may need.  Please continue your efforts at being more active, low cholesterol diet, and weight control.  You are otherwise up to date with prevention measures today.  Please keep your appointments with your specialists as you may have planned  Please go to the LAB at the blood drawing area for the tests to be done  You will be contacted by phone if any changes need to be made immediately.  Otherwise, you will receive a letter about your results with an explanation, but please check with MyChart first.  Please remember to sign up for MyChart if you have not done so, as this will be important to you in the future with finding out test results, communicating by private email, and scheduling acute appointments online when needed.  Please make an Appointment to return in 6 months, or sooner if needed

## 2020-01-29 LAB — URINALYSIS, ROUTINE W REFLEX MICROSCOPIC
Bilirubin Urine: NEGATIVE
Hgb urine dipstick: NEGATIVE
Ketones, ur: NEGATIVE
Leukocytes,Ua: NEGATIVE
Nitrite: NEGATIVE
Specific Gravity, Urine: 1.02 (ref 1.000–1.030)
Urine Glucose: 100 — AB
Urobilinogen, UA: 0.2 (ref 0.0–1.0)
pH: 5.5 (ref 5.0–8.0)

## 2020-01-29 LAB — MICROALBUMIN / CREATININE URINE RATIO
Creatinine,U: 67.2 mg/dL
Microalb Creat Ratio: 21.5 mg/g (ref 0.0–30.0)
Microalb, Ur: 14.5 mg/dL — ABNORMAL HIGH (ref 0.0–1.9)

## 2020-01-31 ENCOUNTER — Other Ambulatory Visit: Payer: Self-pay | Admitting: Endocrinology

## 2020-01-31 ENCOUNTER — Encounter: Payer: Self-pay | Admitting: Internal Medicine

## 2020-01-31 ENCOUNTER — Other Ambulatory Visit: Payer: Self-pay | Admitting: Internal Medicine

## 2020-01-31 MED ORDER — VITAMIN D (ERGOCALCIFEROL) 1.25 MG (50000 UNIT) PO CAPS: 50000.0000 [IU] | ORAL_CAPSULE | ORAL | 0 refills | Status: DC

## 2020-02-01 ENCOUNTER — Encounter: Payer: Self-pay | Admitting: Internal Medicine

## 2020-02-01 NOTE — Assessment & Plan Note (Signed)
Ok for increased candsartan hct 32.25

## 2020-02-01 NOTE — Assessment & Plan Note (Signed)

## 2020-02-09 ENCOUNTER — Other Ambulatory Visit: Payer: Self-pay

## 2020-02-09 NOTE — Telephone Encounter (Signed)
1.Medication Requested:Vitamin D, Ergocalciferol, (DRISDOL) 1.25 MG (50000 UNIT) CAPS capsule  2. Pharmacy (Name, Street, City):[facility] [phone] - [place], [place] - [address] AT [address]  3. On Med List: Yes   4. Last Visit with PCP: 1.5.2021   5. Next visit date with PCP: n/a   Agent: Please be advised that RX refills may take up to 3 business days. We ask that you follow-up with your pharmacy.

## 2020-02-13 NOTE — Telephone Encounter (Signed)
Tried to call patient. No vm to leave a message.   erx was sent in on 01/31/2020.   Pt should start day vitamin D (per PCP) 2000 units daily.

## 2020-03-03 ENCOUNTER — Ambulatory Visit: Payer: Medicare Other | Admitting: Endocrinology

## 2020-03-17 ENCOUNTER — Other Ambulatory Visit: Payer: Self-pay | Admitting: Internal Medicine

## 2020-03-17 ENCOUNTER — Other Ambulatory Visit: Payer: Self-pay | Admitting: Endocrinology

## 2020-03-17 DIAGNOSIS — E1121 Type 2 diabetes mellitus with diabetic nephropathy: Secondary | ICD-10-CM

## 2020-03-17 NOTE — Telephone Encounter (Signed)
Please refill as per office routine med refill policy (all routine meds refilled for 3 mo or monthly per pt preference up to one year from last visit, then month to month grace period for 3 mo, then further med refills will have to be denied)  

## 2020-05-08 ENCOUNTER — Other Ambulatory Visit: Payer: Self-pay | Admitting: Internal Medicine

## 2020-05-08 NOTE — Telephone Encounter (Signed)
Please change to OTC Vitamin D3 at 2000 units per day, indefinitely.  

## 2020-05-26 ENCOUNTER — Encounter: Payer: Self-pay | Admitting: Endocrinology

## 2020-05-26 ENCOUNTER — Ambulatory Visit (INDEPENDENT_AMBULATORY_CARE_PROVIDER_SITE_OTHER): Payer: Medicare Other | Admitting: Endocrinology

## 2020-05-26 ENCOUNTER — Other Ambulatory Visit: Payer: Self-pay

## 2020-05-26 VITALS — BP 140/88 | HR 82 | Ht 69.0 in | Wt 261.0 lb

## 2020-05-26 DIAGNOSIS — E1121 Type 2 diabetes mellitus with diabetic nephropathy: Secondary | ICD-10-CM

## 2020-05-26 LAB — POCT GLYCOSYLATED HEMOGLOBIN (HGB A1C): Hemoglobin A1C: 7.9 % — AB (ref 4.0–5.6)

## 2020-05-26 MED ORDER — REPAGLINIDE 1 MG PO TABS
1.0000 mg | ORAL_TABLET | Freq: Two times a day (BID) | ORAL | 3 refills | Status: DC
Start: 2020-05-26 — End: 2021-02-09

## 2020-05-26 NOTE — Patient Instructions (Addendum)
Your blood pressure is high today.  Please see your primary care provider soon, to have it rechecked I have sent a prescription to your pharmacy, to increase the repaglinide to twice a day (breakfast and supper) Please continue the same metformin check your blood sugar once a day.  vary the time of day when you check, between before the 3 meals, and at bedtime.  also check if you have symptoms of your blood sugar being too high or too low.  please keep a record of the readings and bring it to your next appointment here (or you can bring the meter itself).  You can write it on any piece of paper.  please call us sooner if your blood sugar goes below 70, or if you have a lot of readings over 200.   Please come back for a follow-up appointment in 4 months.

## 2020-05-26 NOTE — Progress Notes (Signed)
Subjective:    Patient ID: Roberto Rivera, male    DOB: 07-Aug-1951, 69 y.o.   MRN: [id number]  HPI Pt returns for f/u of diabetes mellitus: DM type: 2 Dx'ed: 2015. Complications: renal insuff Therapy: 2 oral meds DKA: never Severe hypoglycemia: never.  Pancreatitis: never Pancreatic imaging: never SDOH: he declines name brand meds, and also bromocriptine, due to cost.   Other: he has never taken insulin; edema limits rx options; renal insuff limits metformin dosage.  Interval history:  pt states he feels well in general.  He says cbg's vary from 117-200.  He takes meds as rx'ed.   Past Medical History:  Diagnosis Date  . ALLERGIC RHINITIS 04/01/2007  . Diabetes mellitus   . DIABETES MELLITUS, TYPE II 04/01/2007  . 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 Hypercholesteremia   . HYPERCHOLESTEROLEMIA 02/07/2008  . Hypertension   . HYPERTENSION 04/01/2007  . MICROCYTOSIS 02/07/2008  . OSTEOARTHRITIS, SPINE 02/07/2008  . THYROID CYST 02/07/2008    No past surgical history on file.  Social History   Socioeconomic History  . Marital status: Married    Spouse name: Not on file  . Number of children: Not on file  . Years of education: Not on file  . Highest education level: Not on file  Occupational History  . 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 Smoking status: Current Every Day Smoker    Packs/day: 1.00    Types: Cigarettes  . Smokeless tobacco: Never Used  Substance and Sexual Activity  . Alcohol use: No    Alcohol/week: 0.0 standard drinks  . Drug use: No  . Sexual activity: Not on file  Other Topics Concern  . Not on file  Social History Narrative   Divorced x many years   Social Determinants of Health   Financial Resource Strain:   . Difficulty of Paying Living Expenses: Not on file  Food Insecurity:   . Worried About Programme researcher, broadcasting/film/video in the Last Year: Not on file  . Ran Out of Food in the Last Year: Not on file  Transportation  Needs:   . Lack of Transportation (Medical): Not on file  . Lack of Transportation (Non-Medical): Not on file  Physical Activity:   . Days of Exercise per Week: Not on file  . Minutes of Exercise per Session: Not on file  Stress:   . Feeling of Stress : Not on file  Social Connections:   . Frequency of Communication with Friends and Family: Not on file  . Frequency of Social Gatherings with Friends and Family: Not on file  . Attends Religious Services: Not on file  . Active Member of Clubs or Organizations: Not on file  . Attends Banker Meetings: Not on file  . Marital Status: Not on file  Intimate Partner Violence:   . Fear of Current or Ex-Partner: Not on file  . Emotionally Abused: Not on file  . Physically Abused: Not on file  . Sexually Abused: Not on file    Current Outpatient Medications on File Prior to Visit  Medication Sig Dispense Refill  . Accu-Chek FastClix Lancets MISC USE TO CHECK BLOOD SUGAR AS DIRECTED ONCE DAILY 102 each 2  . ACCU-CHEK GUIDE test strip USE AS DIRECTED TO TEST BLOOD SUGAR ONCE DAILY 100 strip 0  . amLODipine (NORVASC) 10 MG tablet TAKE 1 TABLET(10 MG) BY MOUTH DAILY 90 tablet 3  . Blood Glucose Monitoring Suppl (ACCU-CHEK AVIVA) device  Use to monitor glucose levels daily; E11.21 1 each 0  . Candesartan Cilexetil-HCTZ 32-25 MG TABS 1 tab by mouth once daily 90 tablet 3  . metFORMIN (GLUCOPHAGE-XR) 500 MG 24 hr tablet TAKE 1 TABLET(500 MG) BY MOUTH DAILY WITH BREAKFAST 90 tablet 0  . metoprolol succinate (TOPROL-XL) 50 MG 24 hr tablet TAKE 1 TABLET BY MOUTH DAILY. TAKE WITH OR IMMEDIATELY FOLLOWING A MEAL 90 tablet 3  . potassium chloride (KLOR-CON) 8 MEQ tablet 2 tab by mouth once daily 180 tablet 3  . rosuvastatin (CRESTOR) 5 MG tablet 1 tab by mouth once daily 90 tablet 3  . tadalafil (CIALIS) 20 MG tablet Take 20 mg by mouth daily as needed for erectile dysfunction.    . tizanidine (ZANAFLEX) 2 MG capsule Take 1 capsule (2 mg total)  by mouth 3 (three) times daily as needed for muscle spasms. 60 capsule 1  . Vitamin D, Ergocalciferol, (DRISDOL) 1.25 MG (50000 UNIT) CAPS capsule Take 1 capsule (50,000 Units total) by mouth every 7 (seven) days. 12 capsule 0   No current facility-administered medications on file prior to visit.    No Known Allergies  Family History  Problem Relation Age of Onset  . Cancer Mother        had uncertain type of cancer    BP 140/88   Pulse 82   Ht 5\' 9"  (1.753 m)   Wt 261 lb (118.4 kg)   SpO2 98%   BMI 38.54 kg/m    Review of Systems He denies hypoglycemia.      Objective:   Physical Exam VITAL SIGNS:  See vs page GENERAL: no distress Pulses: dorsalis pedis intact bilat.   MSK: no deformity of the feet CV: 1+ bilat leg edema Skin:  no ulcer on the feet.  normal color and temp on the feet.   Neuro: sensation is intact to touch on the feet.   Ext: there is bilateral onychomycosis of the toenails.    Lab Results  Component Value Date   HGBA1C 7.9 (A) 05/26/2020   Lab Results  Component Value Date   CREATININE 1.40 01/28/2020   BUN 22 01/28/2020   NA 138 01/28/2020   K 3.7 01/28/2020   CL 102 01/28/2020   CO2 30 01/28/2020       Assessment & Plan:  HTN: is noted today Type 2 DM, with CRI: uncontrolled.  Patient Instructions  Your blood pressure is high today.  Please see your primary care provider soon, to have it rechecked I have sent a prescription to your pharmacy, to increase the repaglinide to twice a day (breakfast and supper) Please continue the same metformin check your blood sugar once a day.  vary the time of day when you check, between before the 3 meals, and at bedtime.  also check if you have symptoms of your blood sugar being too high or too low.  please keep a record of the readings and bring it to your next appointment here (or you can bring the meter itself).  You can write it on any piece of paper.  please call 03/29/2020 sooner if your blood sugar goes  below 70, or if you have a lot of readings over 200.   Please come back for a follow-up appointment in 4 months.

## 2020-06-30 ENCOUNTER — Ambulatory Visit (HOSPITAL_COMMUNITY)
Admission: EM | Admit: 2020-06-30 | Discharge: 2020-06-30 | Disposition: A | Discharge status: Home / Self Care | Payer: Medicare Other | Attending: Internal Medicine | Admitting: Internal Medicine

## 2020-06-30 ENCOUNTER — Inpatient Hospital Stay (HOSPITAL_COMMUNITY)
Admission: EM | Admit: 2020-06-30 | Discharge: 2020-07-02 | DRG: 065 | Disposition: A | Discharge status: Home / Self Care | Payer: Medicare Other | Attending: Internal Medicine | Admitting: Internal Medicine

## 2020-06-30 ENCOUNTER — Encounter (HOSPITAL_COMMUNITY): Payer: Self-pay

## 2020-06-30 ENCOUNTER — Observation Stay (HOSPITAL_COMMUNITY): Payer: Medicare Other

## 2020-06-30 ENCOUNTER — Encounter (HOSPITAL_COMMUNITY): Payer: Self-pay | Admitting: Internal Medicine

## 2020-06-30 ENCOUNTER — Emergency Department (HOSPITAL_COMMUNITY): Payer: Medicare Other

## 2020-06-30 ENCOUNTER — Other Ambulatory Visit: Payer: Self-pay

## 2020-06-30 DIAGNOSIS — Z720 Tobacco use: Secondary | ICD-10-CM | POA: Diagnosis present

## 2020-06-30 DIAGNOSIS — Z7984 Long term (current) use of oral hypoglycemic drugs: Secondary | ICD-10-CM

## 2020-06-30 DIAGNOSIS — I77810 Thoracic aortic ectasia: Secondary | ICD-10-CM | POA: Diagnosis present

## 2020-06-30 DIAGNOSIS — E1159 Type 2 diabetes mellitus with other circulatory complications: Secondary | ICD-10-CM | POA: Diagnosis not present

## 2020-06-30 DIAGNOSIS — E1169 Type 2 diabetes mellitus with other specified complication: Secondary | ICD-10-CM | POA: Diagnosis present

## 2020-06-30 DIAGNOSIS — Z79899 Other long term (current) drug therapy: Secondary | ICD-10-CM | POA: Diagnosis not present

## 2020-06-30 DIAGNOSIS — F17210 Nicotine dependence, cigarettes, uncomplicated: Secondary | ICD-10-CM | POA: Diagnosis not present

## 2020-06-30 DIAGNOSIS — I152 Hypertension secondary to endocrine disorders: Secondary | ICD-10-CM | POA: Diagnosis present

## 2020-06-30 DIAGNOSIS — N1832 Chronic kidney disease, stage 3b: Secondary | ICD-10-CM | POA: Diagnosis present

## 2020-06-30 DIAGNOSIS — Z20822 Contact with and (suspected) exposure to covid-19: Secondary | ICD-10-CM | POA: Diagnosis present

## 2020-06-30 DIAGNOSIS — N179 Acute kidney failure, unspecified: Secondary | ICD-10-CM | POA: Diagnosis not present

## 2020-06-30 DIAGNOSIS — E669 Obesity, unspecified: Secondary | ICD-10-CM | POA: Diagnosis present

## 2020-06-30 DIAGNOSIS — R531 Weakness: Secondary | ICD-10-CM | POA: Diagnosis not present

## 2020-06-30 DIAGNOSIS — I63411 Cerebral infarction due to embolism of right middle cerebral artery: Secondary | ICD-10-CM | POA: Diagnosis not present

## 2020-06-30 DIAGNOSIS — Z8673 Personal history of transient ischemic attack (TIA), and cerebral infarction without residual deficits: Secondary | ICD-10-CM | POA: Diagnosis not present

## 2020-06-30 DIAGNOSIS — Z6834 Body mass index (BMI) 34.0-34.9, adult: Secondary | ICD-10-CM | POA: Diagnosis not present

## 2020-06-30 DIAGNOSIS — R29700 NIHSS score 0: Secondary | ICD-10-CM | POA: Diagnosis not present

## 2020-06-30 DIAGNOSIS — E1121 Type 2 diabetes mellitus with diabetic nephropathy: Secondary | ICD-10-CM | POA: Diagnosis not present

## 2020-06-30 DIAGNOSIS — I6389 Other cerebral infarction: Secondary | ICD-10-CM | POA: Diagnosis not present

## 2020-06-30 DIAGNOSIS — E1122 Type 2 diabetes mellitus with diabetic chronic kidney disease: Secondary | ICD-10-CM | POA: Diagnosis present

## 2020-06-30 DIAGNOSIS — G459 Transient cerebral ischemic attack, unspecified: Secondary | ICD-10-CM | POA: Diagnosis not present

## 2020-06-30 DIAGNOSIS — I480 Paroxysmal atrial fibrillation: Secondary | ICD-10-CM | POA: Diagnosis not present

## 2020-06-30 DIAGNOSIS — I639 Cerebral infarction, unspecified: Secondary | ICD-10-CM

## 2020-06-30 DIAGNOSIS — I6601 Occlusion and stenosis of right middle cerebral artery: Secondary | ICD-10-CM | POA: Diagnosis not present

## 2020-06-30 DIAGNOSIS — N189 Chronic kidney disease, unspecified: Secondary | ICD-10-CM | POA: Diagnosis not present

## 2020-06-30 DIAGNOSIS — F419 Anxiety disorder, unspecified: Secondary | ICD-10-CM | POA: Diagnosis present

## 2020-06-30 DIAGNOSIS — E7800 Pure hypercholesterolemia, unspecified: Secondary | ICD-10-CM | POA: Diagnosis not present

## 2020-06-30 DIAGNOSIS — E785 Hyperlipidemia, unspecified: Secondary | ICD-10-CM | POA: Diagnosis not present

## 2020-06-30 DIAGNOSIS — I10 Essential (primary) hypertension: Secondary | ICD-10-CM | POA: Diagnosis present

## 2020-06-30 DIAGNOSIS — J309 Allergic rhinitis, unspecified: Secondary | ICD-10-CM | POA: Diagnosis not present

## 2020-06-30 DIAGNOSIS — I352 Nonrheumatic aortic (valve) stenosis with insufficiency: Secondary | ICD-10-CM | POA: Diagnosis not present

## 2020-06-30 DIAGNOSIS — I129 Hypertensive chronic kidney disease with stage 1 through stage 4 chronic kidney disease, or unspecified chronic kidney disease: Secondary | ICD-10-CM | POA: Diagnosis not present

## 2020-06-30 DIAGNOSIS — E119 Type 2 diabetes mellitus without complications: Secondary | ICD-10-CM

## 2020-06-30 LAB — DIFFERENTIAL
Abs Immature Granulocytes: 0.03 10*3/uL (ref 0.00–0.07)
Basophils Absolute: 0.1 10*3/uL (ref 0.0–0.1)
Basophils Relative: 0 %
Eosinophils Absolute: 0.1 10*3/uL (ref 0.0–0.5)
Eosinophils Relative: 1 %
Immature Granulocytes: 0 %
Lymphocytes Relative: 37 %
Lymphs Abs: 4.1 10*3/uL — ABNORMAL HIGH (ref 0.7–4.0)
Monocytes Absolute: 0.8 10*3/uL (ref 0.1–1.0)
Monocytes Relative: 7 %
Neutro Abs: 6.1 10*3/uL (ref 1.7–7.7)
Neutrophils Relative %: 55 %

## 2020-06-30 LAB — COMPREHENSIVE METABOLIC PANEL
ALT: 14 U/L (ref 0–44)
AST: 17 U/L (ref 15–41)
Albumin: 3.7 g/dL (ref 3.5–5.0)
Alkaline Phosphatase: 82 U/L (ref 38–126)
Anion gap: 13 (ref 5–15)
BUN: 20 mg/dL (ref 8–23)
CO2: 24 mmol/L (ref 22–32)
Calcium: 9.6 mg/dL (ref 8.9–10.3)
Chloride: 98 mmol/L (ref 98–111)
Creatinine, Ser: 1.74 mg/dL — ABNORMAL HIGH (ref 0.61–1.24)
GFR, Estimated: 42 mL/min — ABNORMAL LOW (ref >60)
Glucose, Bld: 150 mg/dL — ABNORMAL HIGH (ref 70–99)
Potassium: 3.5 mmol/L (ref 3.5–5.1)
Sodium: 135 mmol/L (ref 135–145)
Total Bilirubin: 0.8 mg/dL (ref 0.3–1.2)
Total Protein: 7.3 g/dL (ref 6.5–8.1)

## 2020-06-30 LAB — CBC
HCT: 49.5 % (ref 39.0–52.0)
Hemoglobin: 15.8 g/dL (ref 13.0–17.0)
MCH: 25.4 pg — ABNORMAL LOW (ref 26.0–34.0)
MCHC: 31.9 g/dL (ref 30.0–36.0)
MCV: 79.7 fL — ABNORMAL LOW (ref 80.0–100.0)
Platelets: 239 10*3/uL (ref 150–400)
RBC: 6.21 MIL/uL — ABNORMAL HIGH (ref 4.22–5.81)
RDW: 15.8 % — ABNORMAL HIGH (ref 11.5–15.5)
WBC: 11.1 10*3/uL — ABNORMAL HIGH (ref 4.0–10.5)
nRBC: 0 % (ref 0.0–0.2)

## 2020-06-30 LAB — I-STAT CHEM 8, ED
BUN: 22 mg/dL (ref 8–23)
Calcium, Ion: 1.22 mmol/L (ref 1.15–1.40)
Chloride: 99 mmol/L (ref 98–111)
Creatinine, Ser: 1.7 mg/dL — ABNORMAL HIGH (ref 0.61–1.24)
Glucose, Bld: 144 mg/dL — ABNORMAL HIGH (ref 70–99)
HCT: 51 % (ref 39.0–52.0)
Hemoglobin: 17.3 g/dL — ABNORMAL HIGH (ref 13.0–17.0)
Potassium: 3.5 mmol/L (ref 3.5–5.1)
Sodium: 139 mmol/L (ref 135–145)
TCO2: 25 mmol/L (ref 22–32)

## 2020-06-30 LAB — HEMOGLOBIN A1C
Hgb A1c MFr Bld: 7.3 % — ABNORMAL HIGH (ref 4.8–5.6)
Mean Plasma Glucose: 162.81 mg/dL

## 2020-06-30 LAB — APTT: aPTT: 31 seconds (ref 24–36)

## 2020-06-30 LAB — CBG MONITORING, ED
Glucose-Capillary: 114 mg/dL — ABNORMAL HIGH (ref 70–99)
Glucose-Capillary: 123 mg/dL — ABNORMAL HIGH (ref 70–99)

## 2020-06-30 LAB — RESPIRATORY PANEL BY RT PCR (FLU A&B, COVID)
Influenza A by PCR: NEGATIVE
Influenza B by PCR: NEGATIVE
SARS Coronavirus 2 by RT PCR: NEGATIVE

## 2020-06-30 LAB — PROTIME-INR
INR: 1 (ref 0.8–1.2)
Prothrombin Time: 13 seconds (ref 11.4–15.2)

## 2020-06-30 IMAGING — DX DG CHEST 1V PORT
2 series · 2 of 2 positions shown · non-contrast
Comparison: None.

CLINICAL DATA: Left upper extremity weakness, hypertension, tobacco
abuse

EXAM:
PORTABLE CHEST 1 VIEW

[chest ap (1 of 2)]
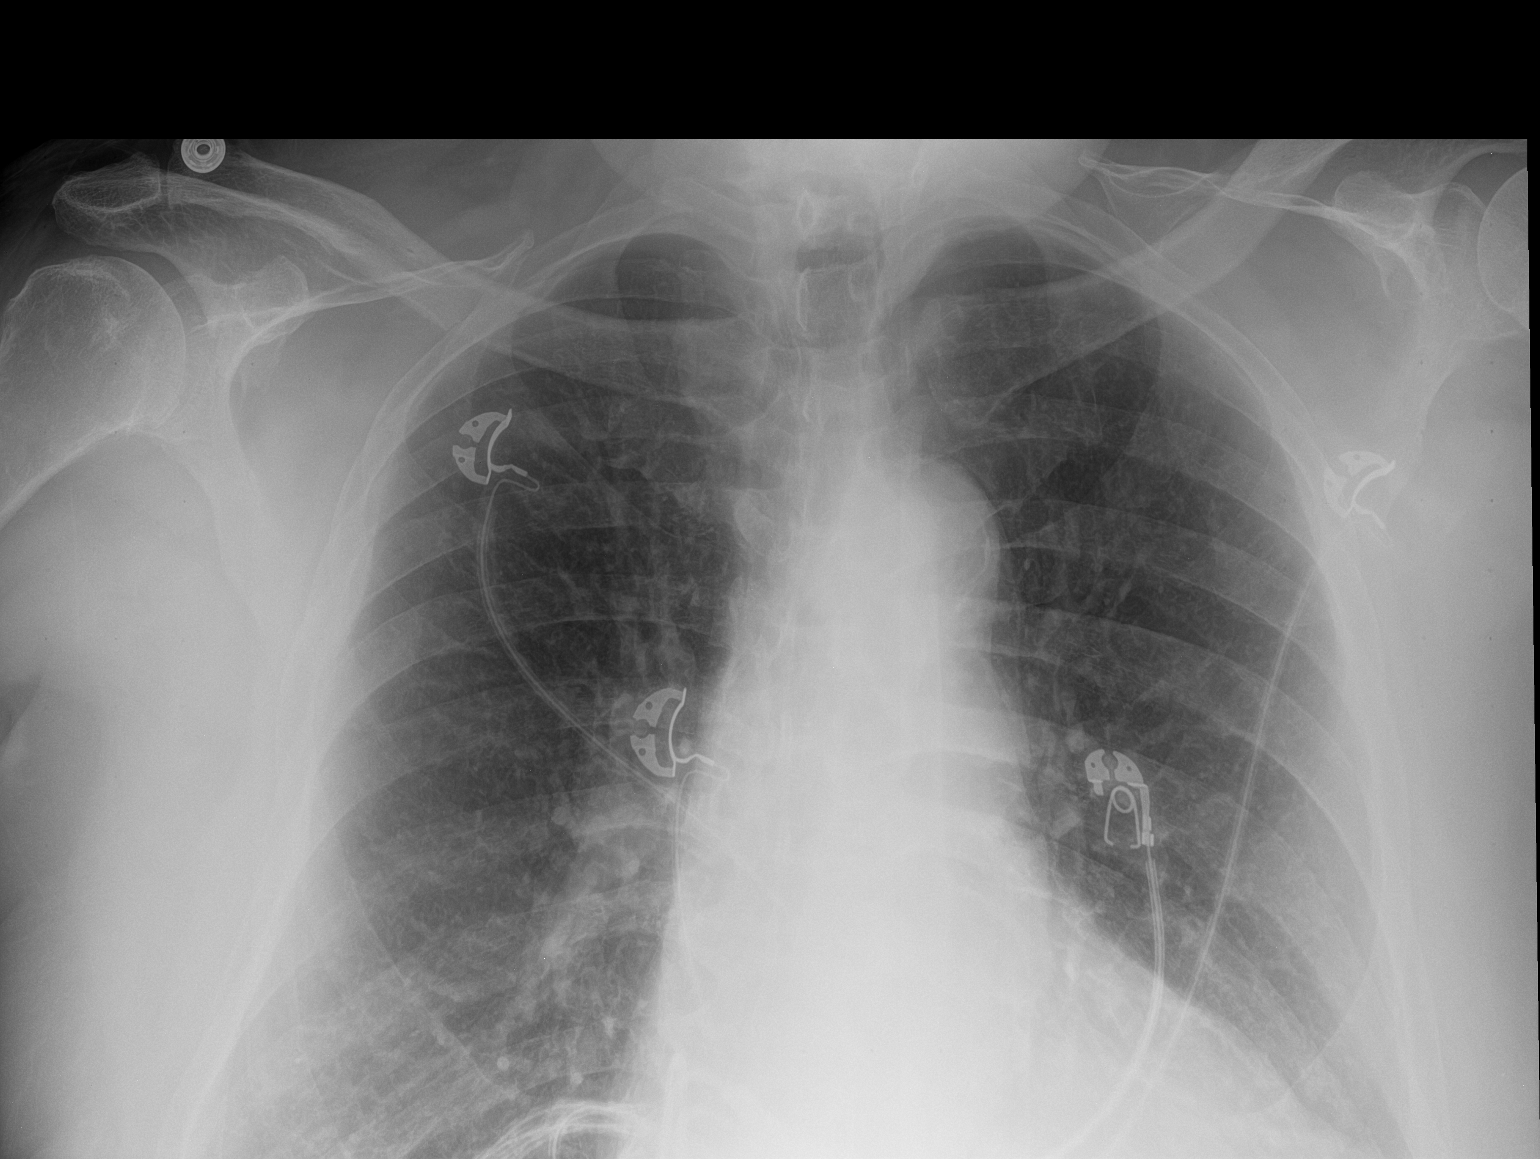

[chest ap (2 of 2)]
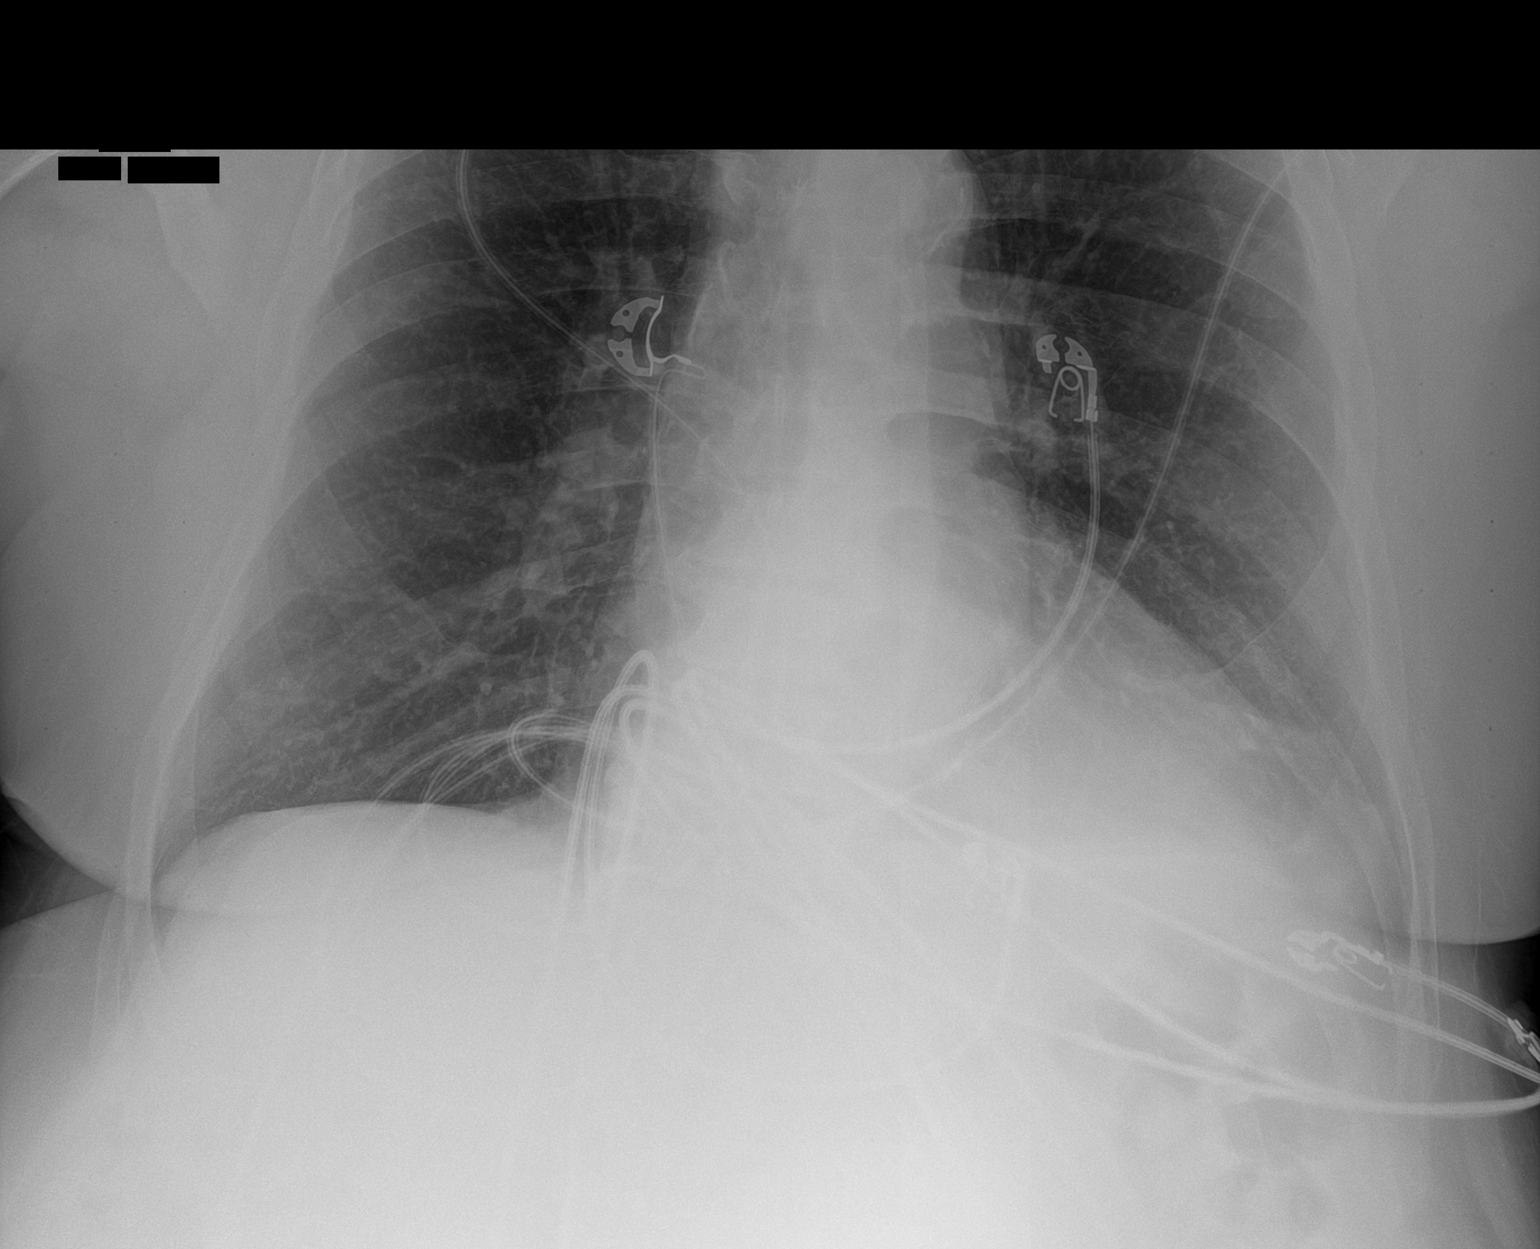

[2 of 2 positions shown; findings below may reference images not displayed]

FINDINGS: Two frontal views of the chest demonstrate an unremarkable cardiac
silhouette. No airspace disease, effusion, or pneumothorax.
Interstitial prominence likely due to tobacco abuse.
IMPRESSION: 1. No acute intrathoracic process.

## 2020-06-30 IMAGING — MR MR MRA HEAD W/O CM
1 series · 20 of 48 positions shown · non-contrast
Comparison: Brain MRI [DATE]

CLINICAL DATA: Stroke.  Right precentral gyrus infarct.

EXAM:
MRA HEAD WITHOUT CONTRAST
TECHNIQUE: Angiographic images of the Circle of Willis were obtained using MRA
technique without intravenous contrast.

[Series 5: 3d cow · axial · 0.5mm · 0.41mm/px · z∈[-42,+37]mm · 20 of 172 slices shown]
[im 1/172]
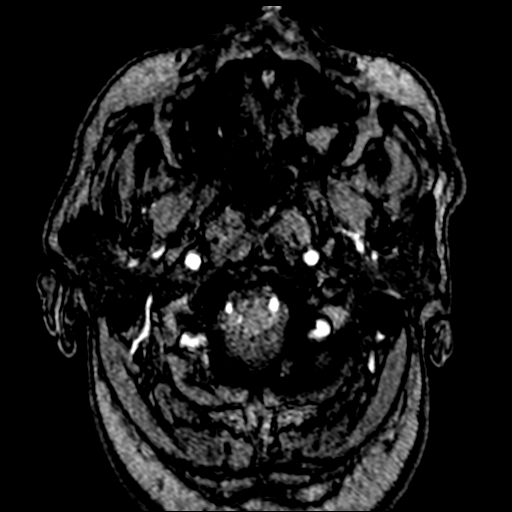
[im 4/172]
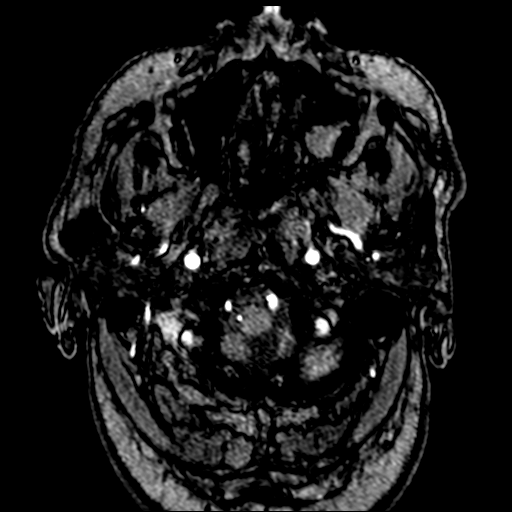
[im 8/172]
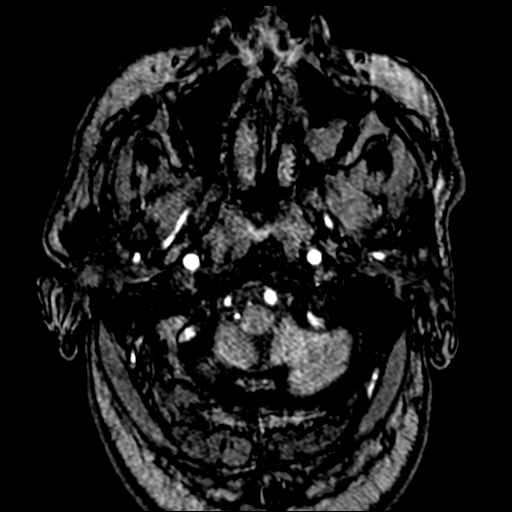
[im 11/172]
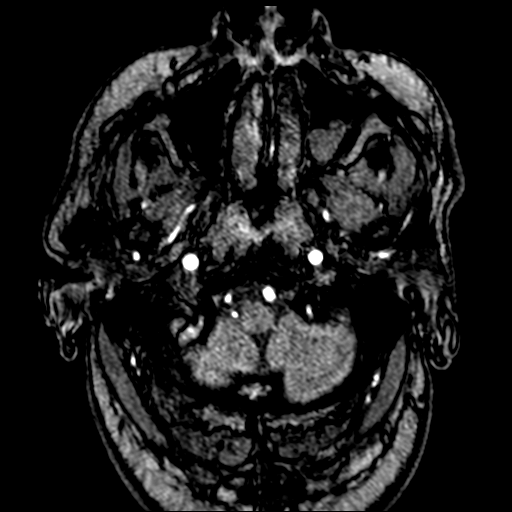
[im 15/172]
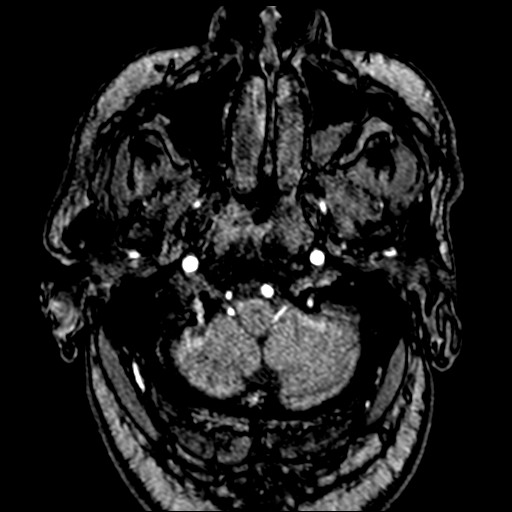
[im 19/172]
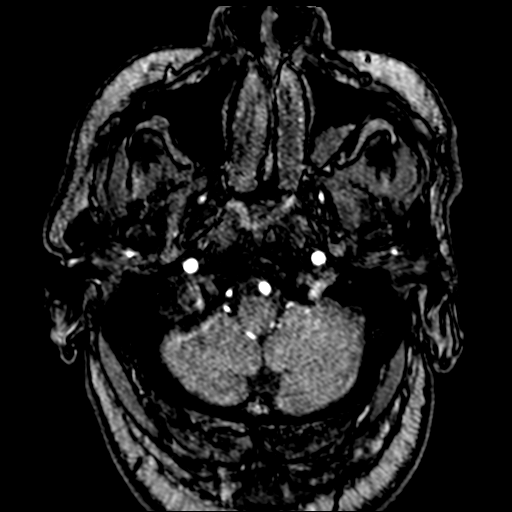
[im 22/172]
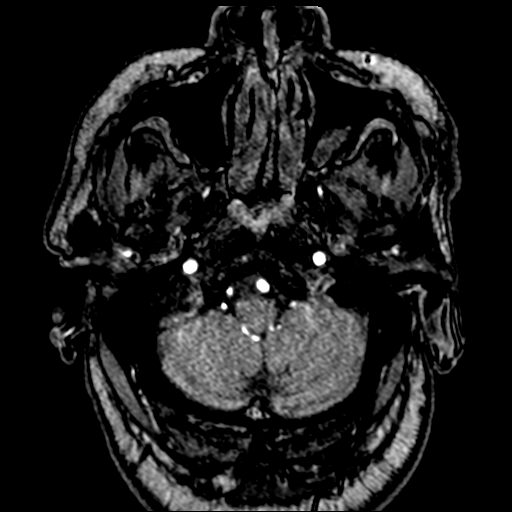
[im 26/172]
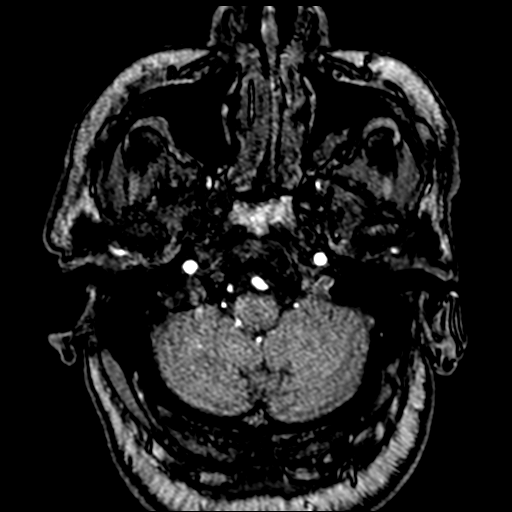
[im 30/172]
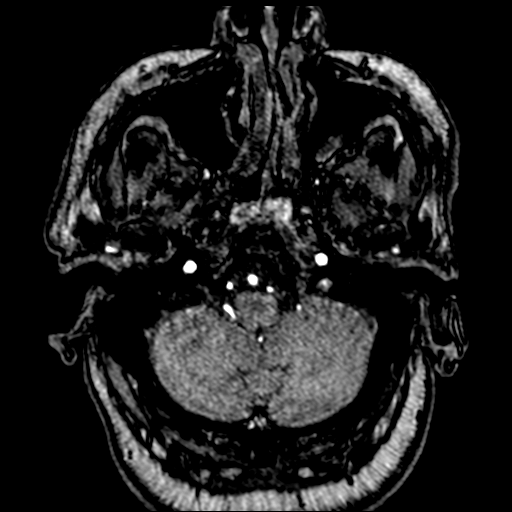
[im 33/172]
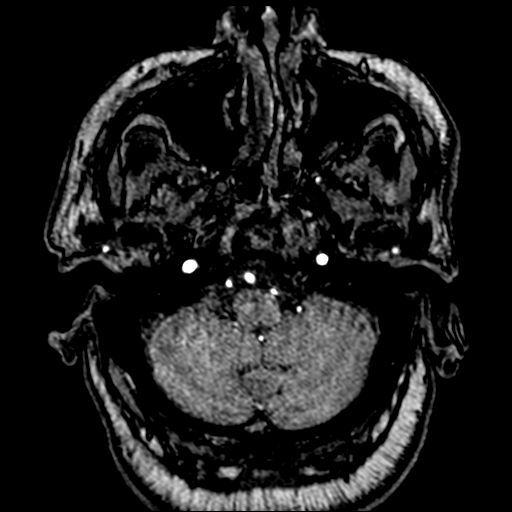
[im 37/172]
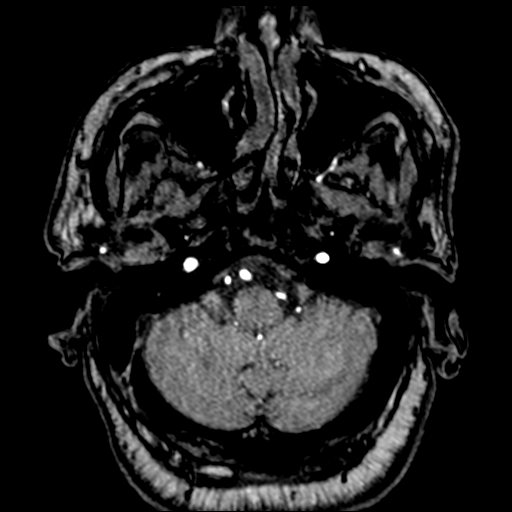
[im 41/172]
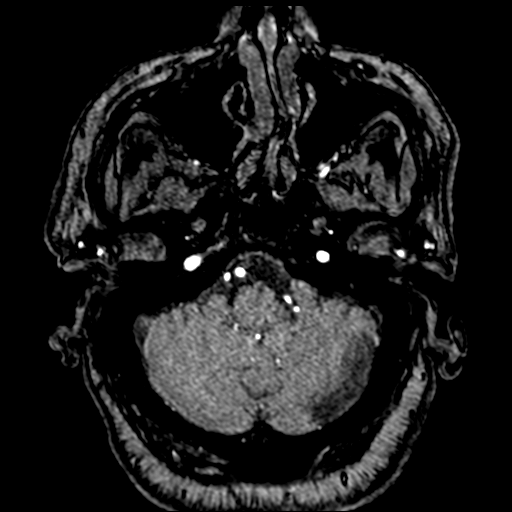
[im 55/172]
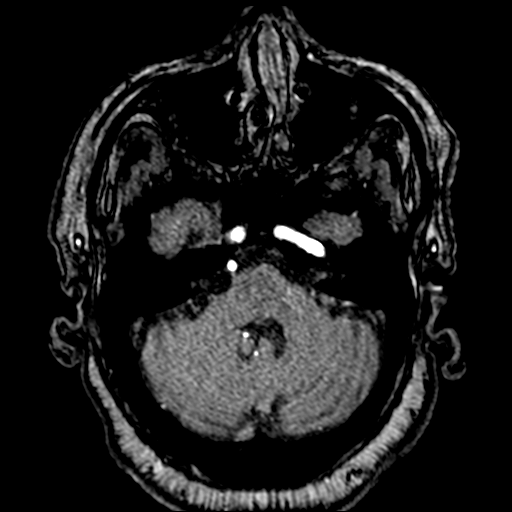
[im 77/172]
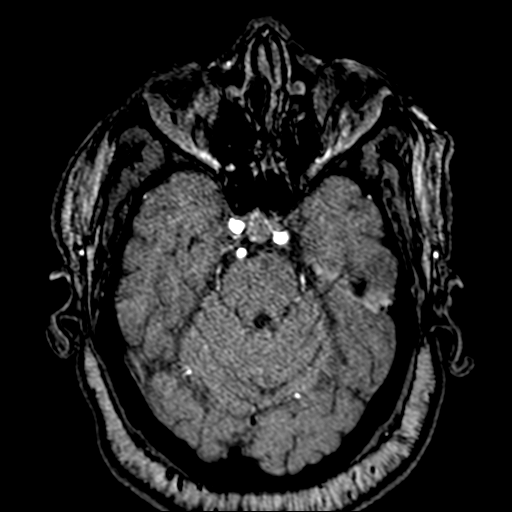
[im 88/172]
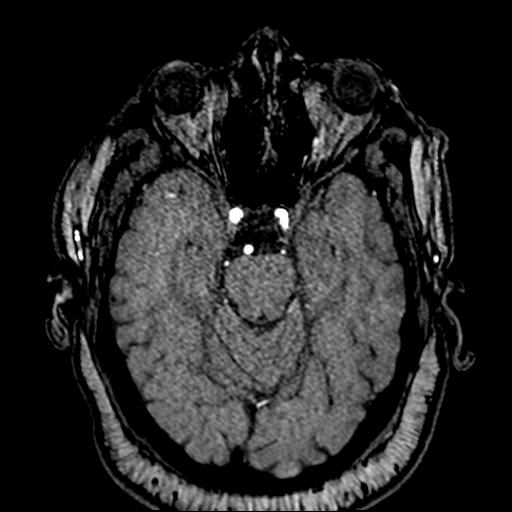
[im 99/172]
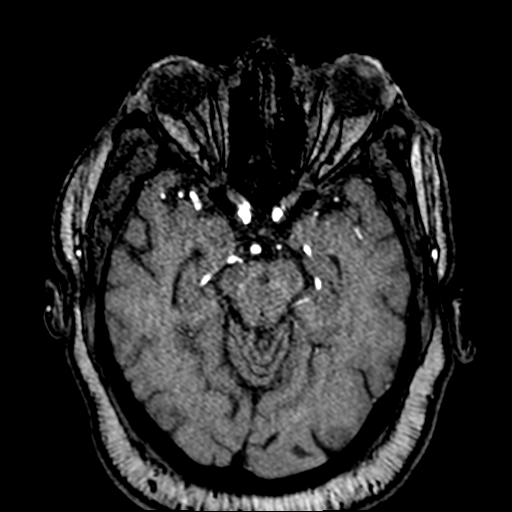
[im 121/172]
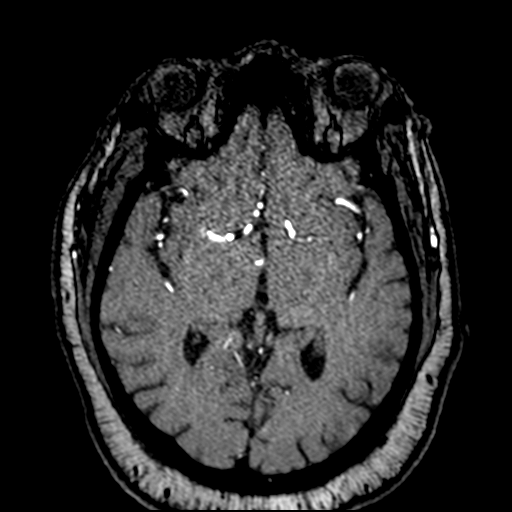
[im 142/172]
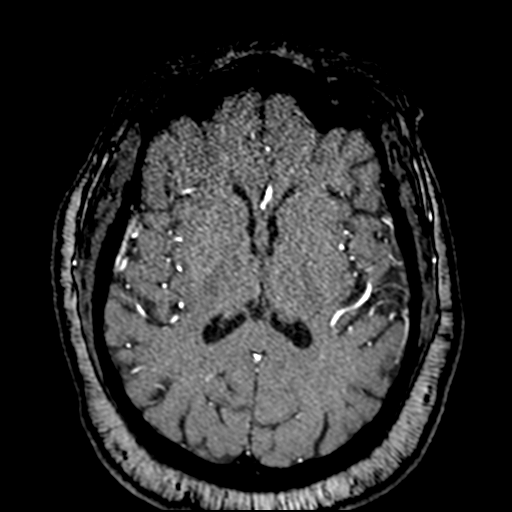
[im 146/172]
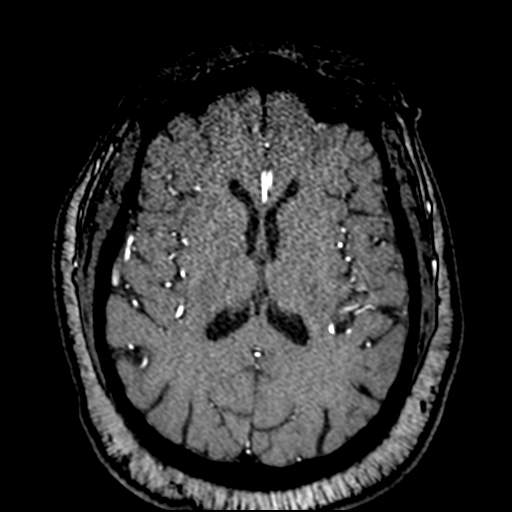
[im 164/172]
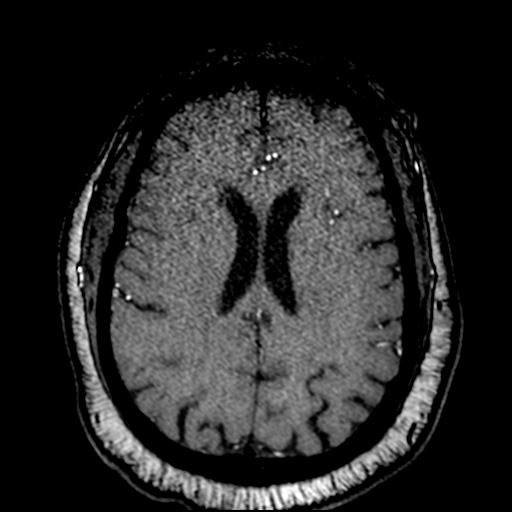

[20 of 48 positions shown; findings below may reference images not displayed]

FINDINGS: POSTERIOR CIRCULATION:

--Vertebral arteries: Normal

--Inferior cerebellar arteries: Normal.

--Basilar artery: Normal.

--Superior cerebellar arteries: Normal.

--Posterior cerebral arteries: Normal.

ANTERIOR CIRCULATION:

--Intracranial internal carotid arteries: Normal.

--Anterior cerebral arteries (ACA): Normal.

--Middle cerebral arteries (MCA): There is severe stenosis or short
segment occlusion of the distal M1 segment of the right MCA. The
distal branches are patent. Left MCA is normal.

ANATOMIC VARIANTS: None
IMPRESSION: Severe stenosis versus short segment occlusion of the distal M1
segment of the right MCA.

## 2020-06-30 IMAGING — CT CT HEAD W/O CM
4 series · 16 of 47 positions shown, 18 images · non-contrast
Comparison: No pertinent prior exams are available for comparison.

CLINICAL DATA: Transient ischemic attack. Intermittent weakness in
left arm, radiating to back of neck and left ear for 1 week.

EXAM:
CT HEAD WITHOUT CONTRAST
TECHNIQUE: Contiguous axial images were obtained from the base of the skull
through the vertex without intravenous contrast.

[Series 3: head without · axial · non-contrast · 0.42mm/px · z∈[+1263,+1393]mm · 7 of 36 slices shown, 9 images]
[im 5/36  brain]
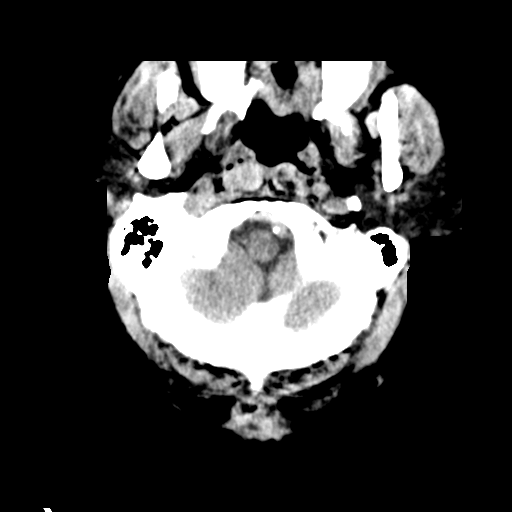
[im 5/36  bone]
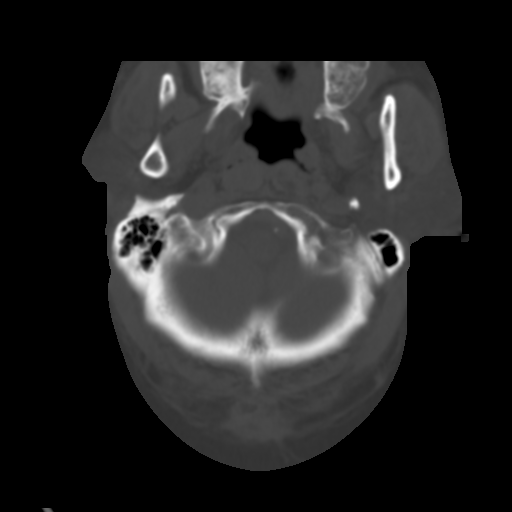
[im 9/36  brain]
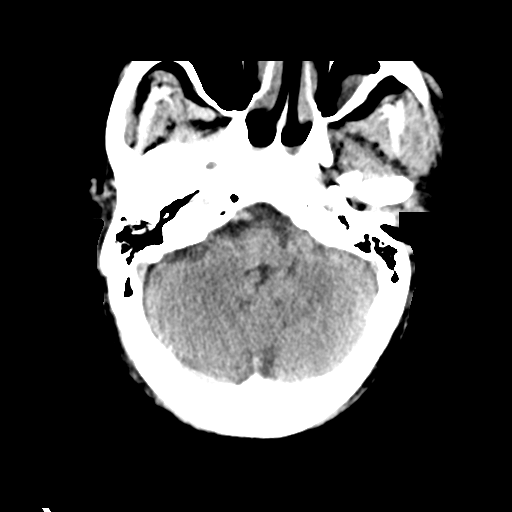
[im 14/36  brain]
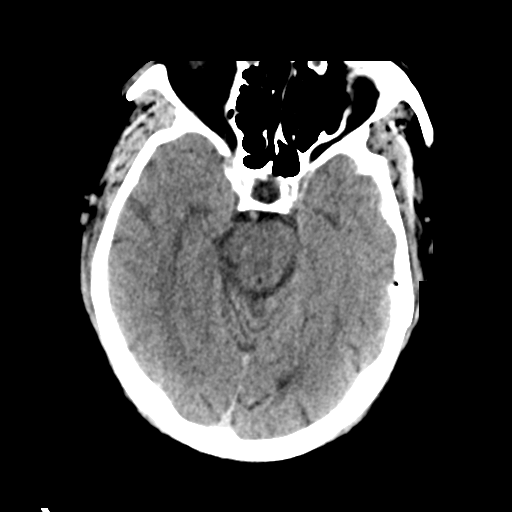
[im 18/36  brain]
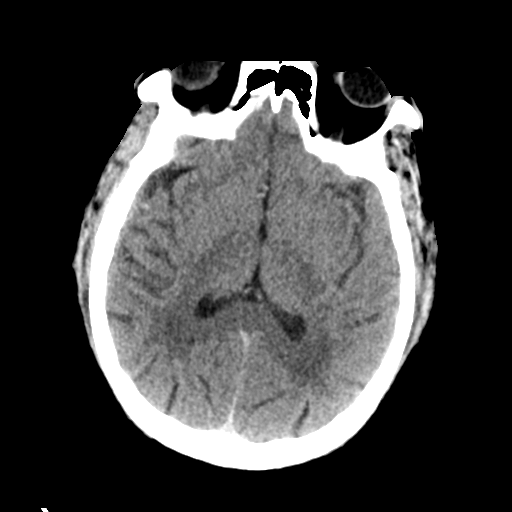
[im 22/36  brain]
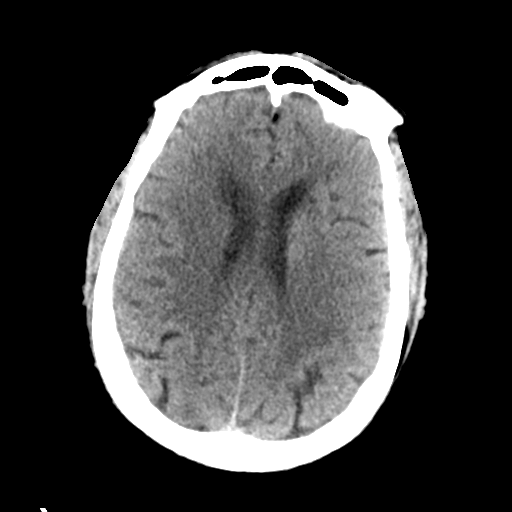
[im 22/36  bone]
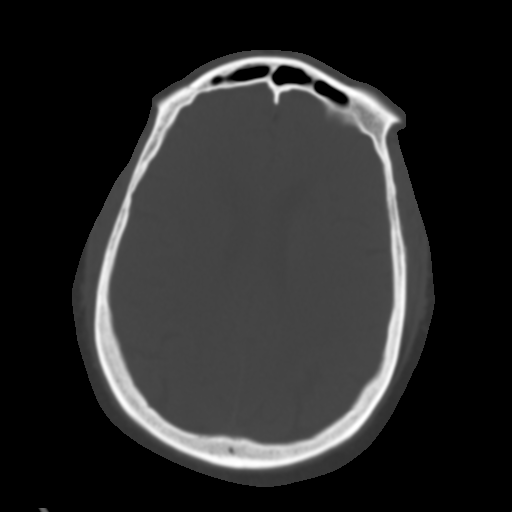
[im 27/36  brain]
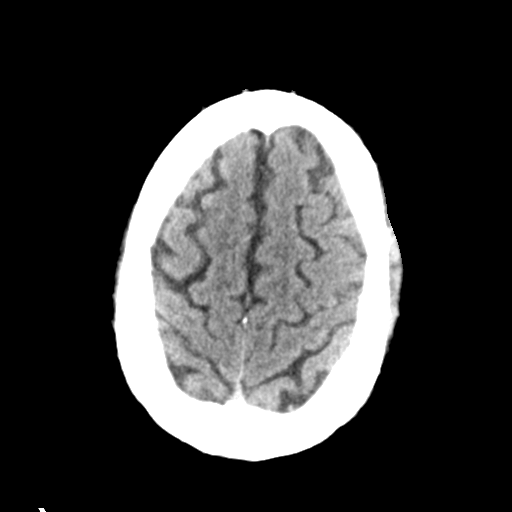
[im 31/36  brain]
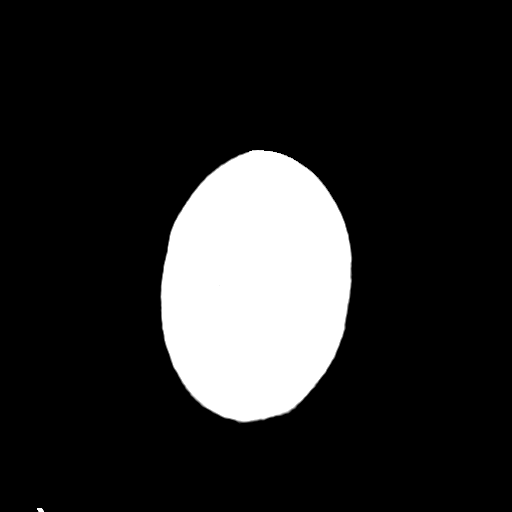

[Series 4: head bone · axial · 0.42mm/px · z∈[+1259,+1295]mm · 3 of 88 slices shown]
[im 9/88  bone]
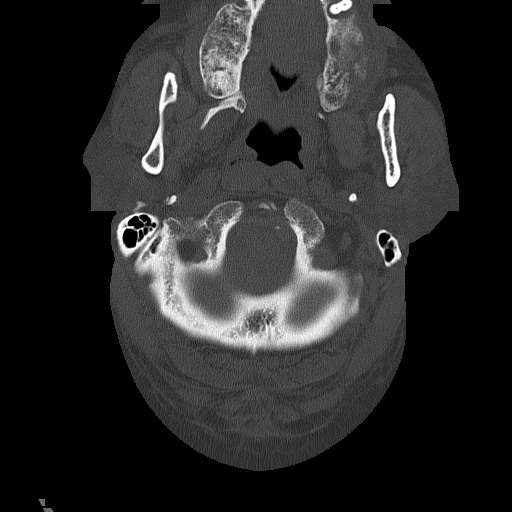
[im 18/88  bone]
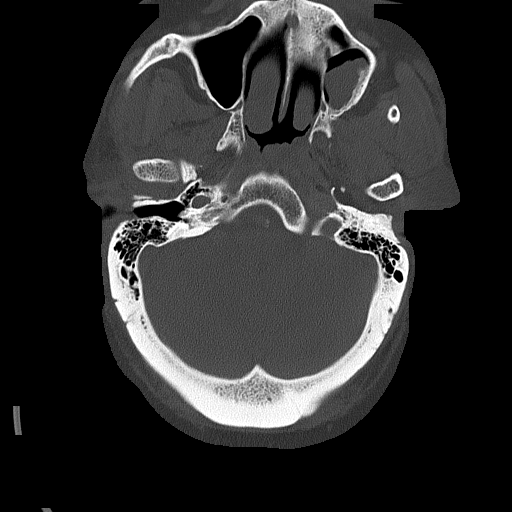
[im 27/88  bone]
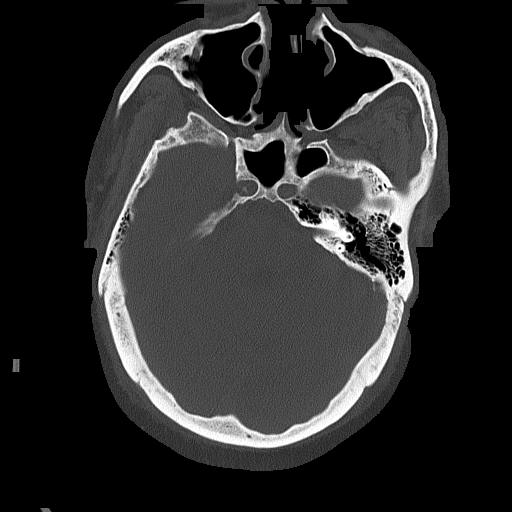

[Series 5: head without cor · coronal · non-contrast · 0.31mm/px · 3 of 67 slices shown]
[im 23/67  brain]
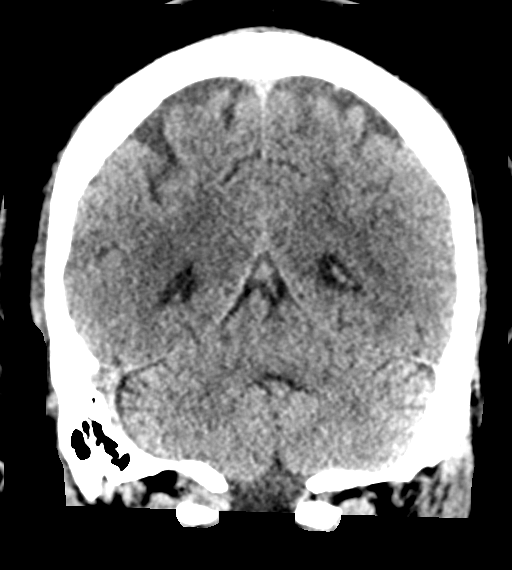
[im 30/67  brain]
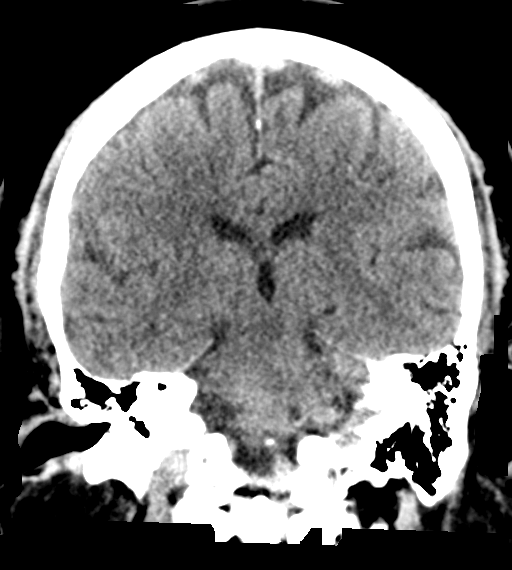
[im 37/67  brain]
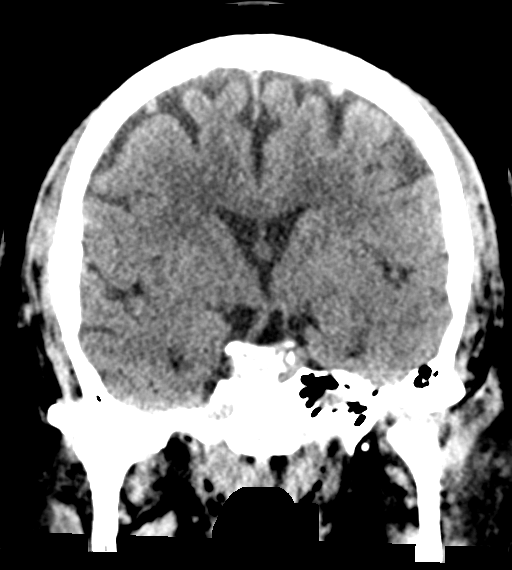

[Series 6: head without sag · sagittal · non-contrast · 0.34mm/px · 3 of 57 slices shown]
[im 19/57  brain]
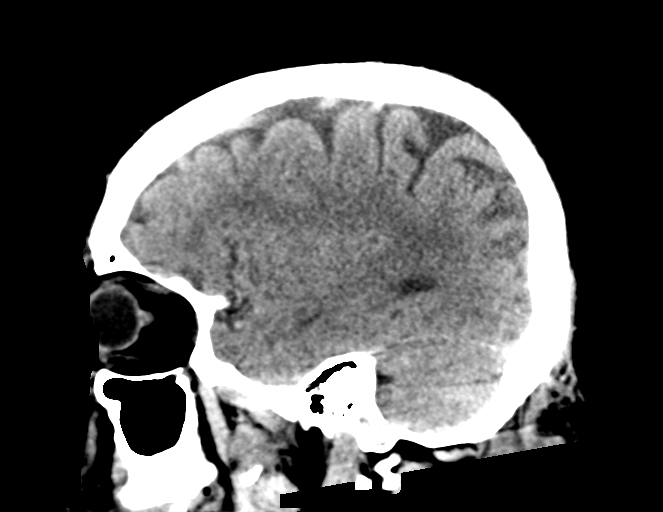
[im 29/57  brain]
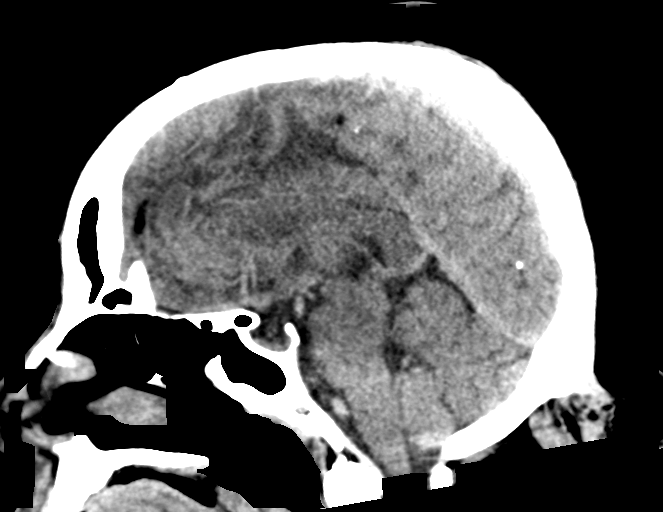
[im 38/57  brain]
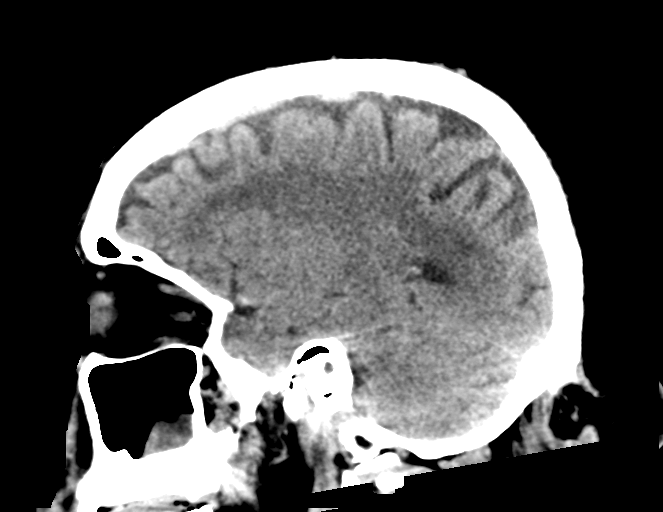

[16 of 47 positions shown; findings below may reference images not displayed]

FINDINGS: Brain:

Mild generalized cerebral atrophy.

Moderate ill-defined hypoattenuation within the cerebral white
matter is nonspecific, but compatible with chronic small vessel
ischemic disease.

There is no acute intracranial hemorrhage.

No demarcated cortical infarct.

No extra-axial fluid collection.

No evidence of intracranial mass.

No midline shift.

Vascular: No hyperdense vessel.  Atherosclerotic calcifications.

Skull: Normal. Negative for fracture or focal lesion.

Sinuses/Orbits: Visualized orbits show no acute finding. Moderate
mucosal thickening within the inferior left maxillary sinus. Mild
scattered paranasal sinus mucosal thickening elsewhere.
IMPRESSION: No evidence of acute intracranial abnormality.

Mild cerebral atrophy with moderate chronic small vessel ischemic
disease.

Paranasal mucosal thickening as described, most notably left
maxillary.

## 2020-06-30 IMAGING — MR MR HEAD W/O CM
12 of 13 series · 44 of 48 positions shown · non-contrast
Comparison: Head CT [DATE]

CLINICAL DATA: Transient ischemia with intermittent left arm
weakness.

EXAM:
MRI HEAD WITHOUT CONTRAST
TECHNIQUE: Multiplanar, multiecho pulse sequences of the brain and surrounding
structures were obtained without intravenous contrast.

[Series 5: DWI · axial · 3.0mm · 0.88mm/px · z∈[-51,+94]mm · 7 of 100 slices shown (1 of 4)]
[im 1/100]
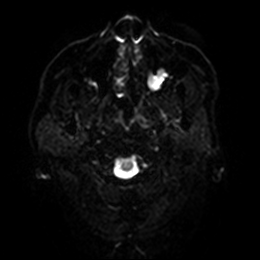
[im 17/100]
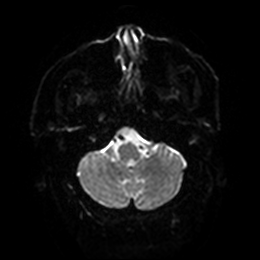
[im 34/100]
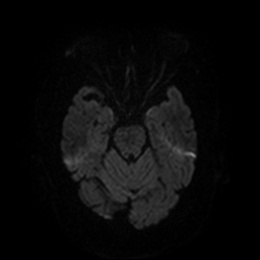
[im 50/100]
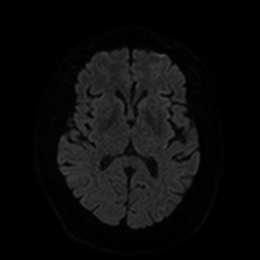
[im 67/100]
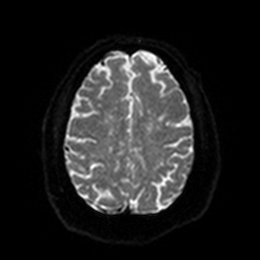
[im 83/100]
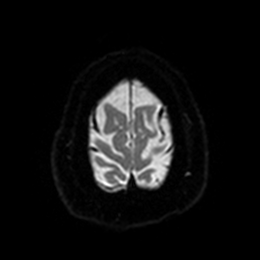
[im 100/100]
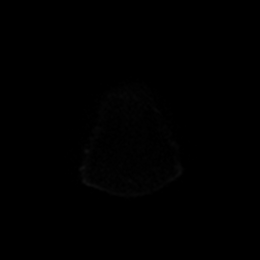

[Series 6: DWI · axial · 3.0mm · 0.88mm/px · z∈[-51,+94]mm · 4 of 50 slices shown (2 of 4)]
[im 1/50]
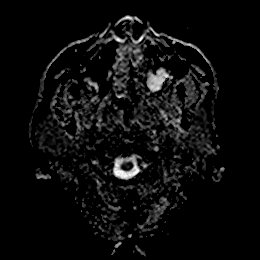
[im 17/50]
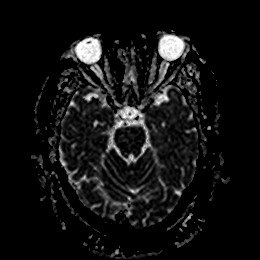
[im 33/50]
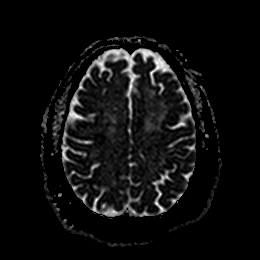
[im 50/50]
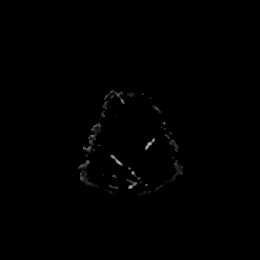

[Series 7: DWI · coronal · 4.0mm · 0.88mm/px · 6 of 72 slices shown (3 of 4)]
[im 1/72]
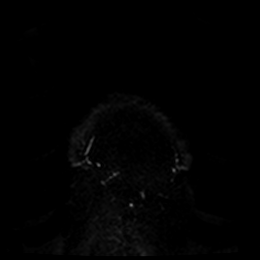
[im 15/72]
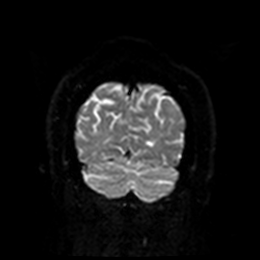
[im 29/72]
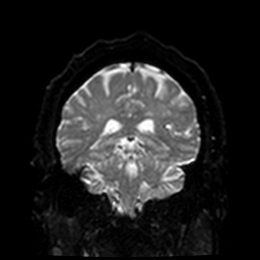
[im 43/72]
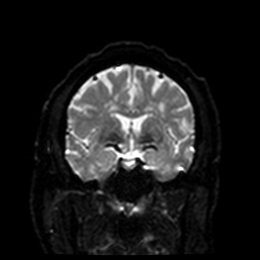
[im 57/72]
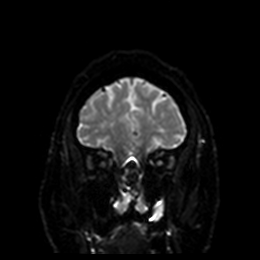
[im 72/72]
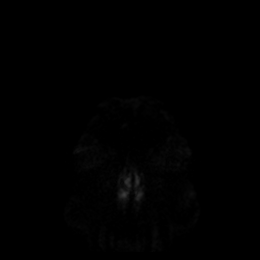

[Series 8: DWI · coronal · 4.0mm · 0.88mm/px · 3 of 36 slices shown (4 of 4)]
[im 1/36]
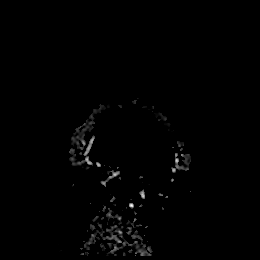
[im 18/36]
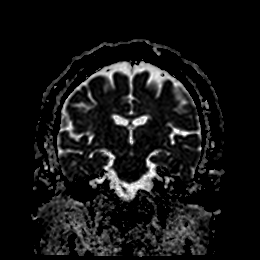
[im 36/36]
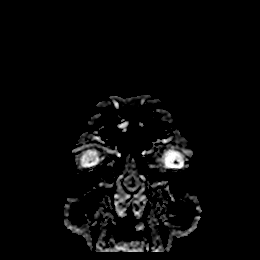

[Series 9: T1 · sagittal · 5.0mm · 0.75mm/px · 2 of 23 slices shown]
[im 1/23]
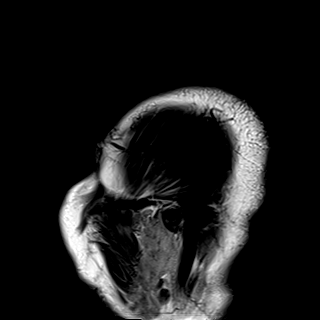
[im 23/23]
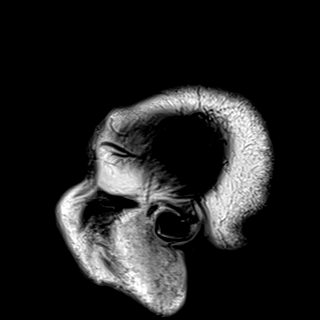

[Series 10: T2 · axial · 5.0mm · 0.72mm/px · z∈[-51,+91]mm · 2 of 25 slices shown (1 of 2)]
[im 1/25]
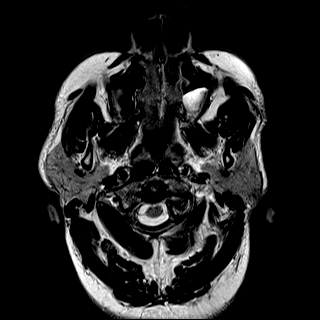
[im 25/25]
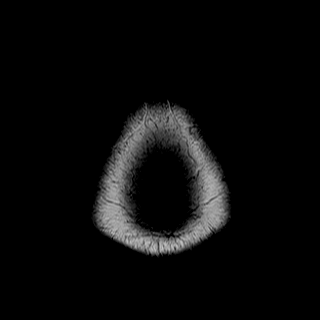

[Series 11: FLAIR · axial · 5.0mm · 0.45mm/px · z∈[-51,+91]mm · 2 of 25 slices shown]
[im 1/25]
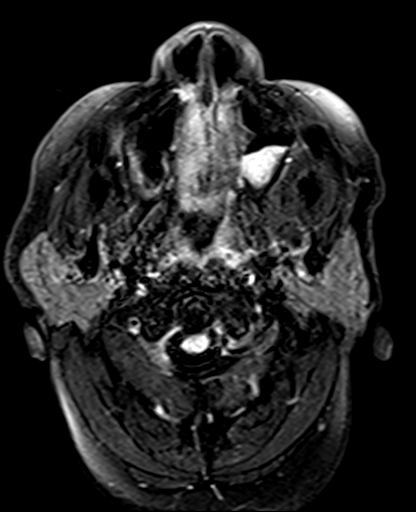
[im 25/25]
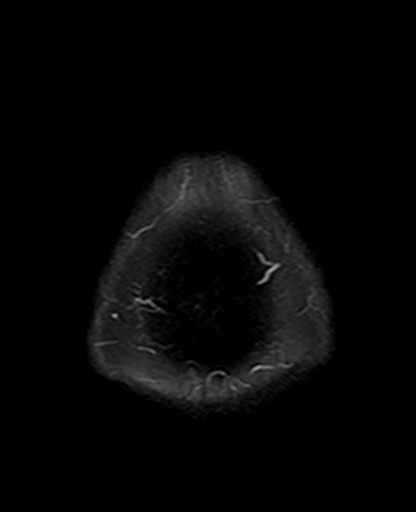

[Series 12: mag_images · axial · 3.0mm · 0.90mm/px · z∈[-55,+96]mm · 4 of 52 slices shown]
[im 1/52]
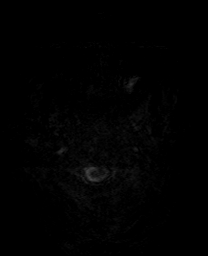
[im 18/52]
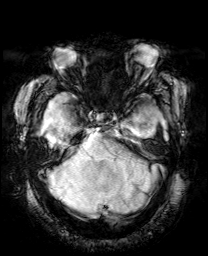
[im 35/52]
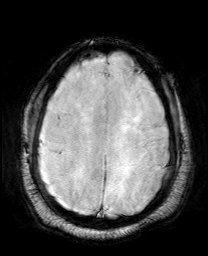
[im 52/52]
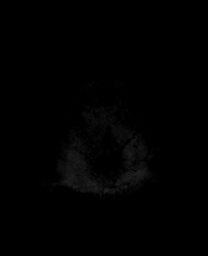

[Series 13: pha_images · axial · 3.0mm · 0.90mm/px · z∈[-55,+96]mm · 4 of 51 slices shown]
[im 1/51]
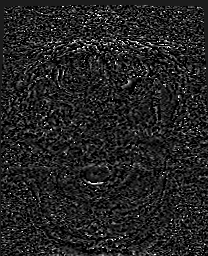
[im 17/51]
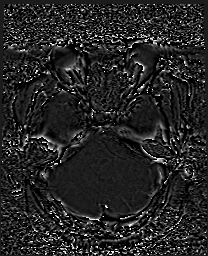
[im 34/51]
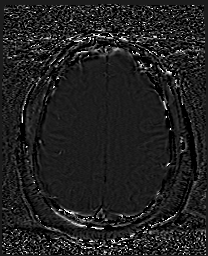
[im 51/51]
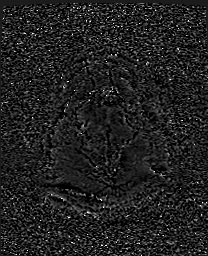

[Series 14: swi_images · axial · 3.0mm · 0.90mm/px · z∈[-55,+96]mm · 4 of 52 slices shown]
[im 1/52]
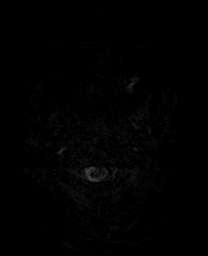
[im 18/52]
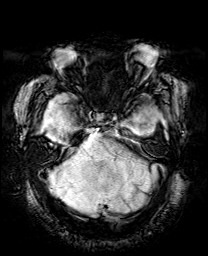
[im 35/52]
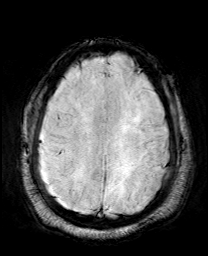
[im 52/52]
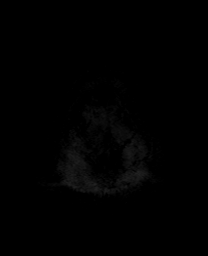

[Series 15: mip_images(sw) · axial · 24.0mm · 0.90mm/px · z∈[-45,+85]mm · 4 of 45 slices shown]
[im 1/45]
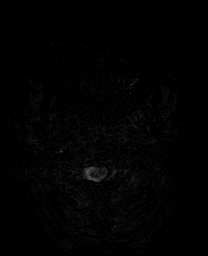
[im 15/45]
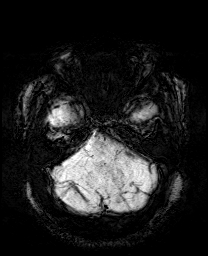
[im 30/45]
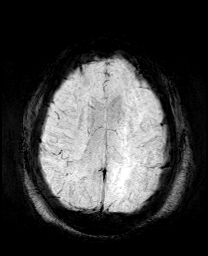
[im 45/45]
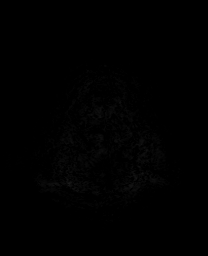

[Series 17: T2 · coronal · 5.0mm · 0.43mm/px · 2 of 29 slices shown (2 of 2)]
[im 1/29]
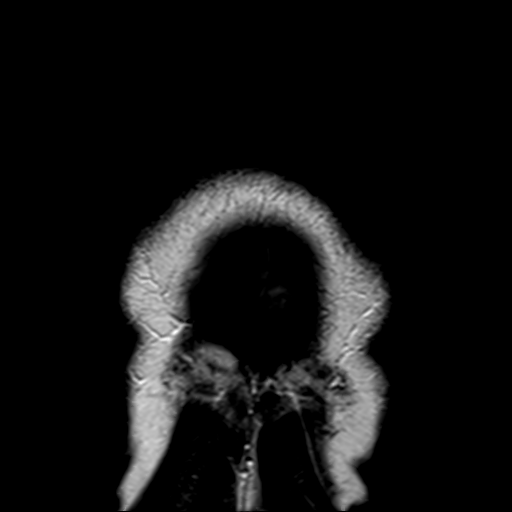
[im 29/29]
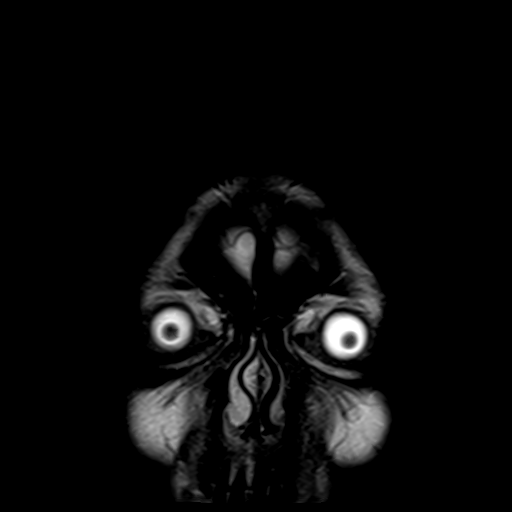

[44 of 48 positions shown; findings below may reference images not displayed]

FINDINGS: Brain: Small acute infarct of the base of the right precentral
gyrus. Small area of abnormal diffusion restriction in the right
occipital cortex. Multifocal hyperintense T2-weighted signal within
the white matter. There is generalized atrophy without lobar
predilection. No chronic microhemorrhage. Normal midline structures.

Vascular: Normal flow voids.

Skull and upper cervical spine: Normal marrow signal.

Sinuses/Orbits: Left maxillary retention cyst.  Normal orbits.

Other: None.
IMPRESSION: 1. Small acute infarct of the base of the right precentral gyrus.
2. Small area of acute ischemia in the right occipital cortex.
3. Generalized atrophy and findings of chronic small vessel disease.

## 2020-06-30 MED ORDER — ASPIRIN 325 MG PO TABS
325.0000 mg | ORAL_TABLET | Freq: Every day | ORAL | Status: DC
  Administered 2020-06-30 – 2020-07-01 (×2): 325 mg via ORAL
  Filled 2020-06-30 (×2): qty 1

## 2020-06-30 MED ORDER — CLOPIDOGREL BISULFATE 75 MG PO TABS
75.0000 mg | ORAL_TABLET | Freq: Every day | ORAL | Status: DC
  Administered 2020-06-30 – 2020-07-01 (×2): 75 mg via ORAL
  Filled 2020-06-30 (×2): qty 1

## 2020-06-30 MED ORDER — ACETAMINOPHEN 650 MG RE SUPP: 650.0000 mg | RECTAL | Status: DC | PRN

## 2020-06-30 MED ORDER — ROSUVASTATIN CALCIUM 5 MG PO TABS: 10.0000 mg | ORAL_TABLET | Freq: Every day | ORAL | Status: DC

## 2020-06-30 MED ORDER — LABETALOL HCL 5 MG/ML IV SOLN: 10.0000 mg | INTRAVENOUS | Status: DC | PRN

## 2020-06-30 MED ORDER — SODIUM CHLORIDE 0.9 % IV BOLUS
1000.0000 mL | Freq: Once | INTRAVENOUS | Status: AC
  Administered 2020-06-30: 1000 mL via INTRAVENOUS

## 2020-06-30 MED ORDER — ACETAMINOPHEN 325 MG PO TABS: 650.0000 mg | ORAL_TABLET | ORAL | Status: DC | PRN

## 2020-06-30 MED ORDER — INSULIN ASPART 100 UNIT/ML ~~LOC~~ SOLN
0.0000 [IU] | Freq: Three times a day (TID) | SUBCUTANEOUS | Status: DC
  Administered 2020-07-01: 1 [IU] via SUBCUTANEOUS
  Administered 2020-07-01: 2 [IU] via SUBCUTANEOUS

## 2020-06-30 MED ORDER — ENOXAPARIN SODIUM 40 MG/0.4ML ~~LOC~~ SOLN
40.0000 mg | SUBCUTANEOUS | Status: DC
  Administered 2020-06-30: 40 mg via SUBCUTANEOUS
  Filled 2020-06-30: qty 0.4

## 2020-06-30 MED ORDER — STROKE: EARLY STAGES OF RECOVERY BOOK: Freq: Once | Status: AC

## 2020-06-30 MED ORDER — ACETAMINOPHEN 160 MG/5ML PO SOLN: 650.0000 mg | ORAL | Status: DC | PRN

## 2020-06-30 MED ORDER — SENNOSIDES-DOCUSATE SODIUM 8.6-50 MG PO TABS: 1.0000 | ORAL_TABLET | Freq: Every evening | ORAL | Status: DC | PRN

## 2020-06-30 NOTE — ED Notes (Signed)
Pt returned from MRI, pt ambulatory to restroom, tolerated well.

## 2020-06-30 NOTE — Consult Note (Addendum)
NEUROLOGY CONSULTATION NOTE   Date of service: June 30, 2020 Patient Name: Roberto Rivera MRN:  [id number] DOB:  1951-03-04 Reason for consult: "Stroke on MRI"  History of Present Illness  Roberto Rivera is a 69 y.o. male with PMH significant for DM2, HTN, HLD, OA, smoking who woke up in the morning and felt left hand was clumsy, noted this while washing his hands. Symptoms self resolved. LKW last night. Had similar episode a week ago.   He had MRI Brain which demonstrated a R precentral and R occipital infarct.  He denies any prior strokes, no arm or leg weakness, no numbness. He does smoke daily. Endorses family hs of strokes.  NIHSS: 0 MRS: 0 LKW: 2100 on 06/29/20, symptoms completely resolved. TPA: not a candidate, symptoms resolved Thrombectomy: Not a candidate, symptoms resolved.   ROS   Constitutional Denies weight loss, fever and chills.  HEENT Denies changes in vision and hearing.  Respiratory Denies SOB and cough.  CV Denies palpitations and CP  GI Denies abdominal pain, nausea, vomiting and diarrhea.  GU Denies dysuria and urinary frequency.  MSK Denies myalgia and joint pain.   Skin Denies rash and pruritus.   Neurological Denies headache and syncope.   Psychiatric Denies recent changes in mood. Denies anxiety and depression.    Past History   Past Medical History:  Diagnosis Date  . ALLERGIC RHINITIS 04/01/2007  . Diabetes mellitus   . DIABETES MELLITUS, TYPE II 04/01/2007  . 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 Hypercholesteremia   . HYPERCHOLESTEROLEMIA 02/07/2008  . Hypertension   . HYPERTENSION 04/01/2007  . MICROCYTOSIS 02/07/2008  . OSTEOARTHRITIS, SPINE 02/07/2008  . THYROID CYST 02/07/2008   No past surgical history on file. Family History  Problem Relation Age of Onset  . Cancer Mother        had uncertain type of cancer   Social History   Socioeconomic History  . Marital status: Married    Spouse name: Not on file  . Number of children: Not on  file  . Years of education: Not on file  . Highest education level: Not on file  Occupational History  . 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 Smoking status: Current Every Day Smoker    Packs/day: 1.00    Types: Cigarettes  . Smokeless tobacco: Never Used  Substance and Sexual Activity  . Alcohol use: No    Alcohol/week: 0.0 standard drinks  . Drug use: No  . Sexual activity: Not on file  Other Topics Concern  . Not on file  Social History Narrative   Divorced x many years   Social Determinants of Health   Financial Resource Strain:   . Difficulty of Paying Living Expenses: Not on file  Food Insecurity:   . Worried About Programme researcher, broadcasting/film/video in the Last Year: Not on file  . Ran Out of Food in the Last Year: Not on file  Transportation Needs:   . Lack of Transportation (Medical): Not on file  . Lack of Transportation (Non-Medical): Not on file  Physical Activity:   . Days of Exercise per Week: Not on file  . Minutes of Exercise per Session: Not on file  Stress:   . Feeling of Stress : Not on file  Social Connections:   . Frequency of Communication with Friends and Family: Not on file  . Frequency of Social Gatherings with Friends and Family: Not on file  . Attends Religious Services: Not  on file  . Active Member of Clubs or Organizations: Not on file  . Attends Banker Meetings: Not on file  . Marital Status: Not on file   No Known Allergies  Medications  (Not in a hospital admission)    Vitals   Vitals:   06/30/20 1452 06/30/20 1656  BP: (!) 141/91 133/79  Pulse: 88 76  Resp: 18 19  Temp: 98 F (36.7 C) 98.5 F (36.9 C)  TempSrc: Oral Oral  SpO2: 98% 99%     There is no height or weight on file to calculate BMI.  Physical Exam   General: Laying comfortably in bed; in no acute distress. HENT: Normal oropharynx and mucosa. Normal external appearance of ears and nose. Neck: Supple, no pain or tenderness CV: No  JVD. No peripheral edema. Pulmonary: Symmetric Chest rise. Normal respiratory effort. Abdomen: Soft to touch, non-tender. Ext: No cyanosis, edema, or deformity Skin: No rash. Normal palpation of skin.  Musculoskeletal: Normal digits and nails by inspection. No clubbing.  Neurologic Examination  Mental status/Cognition: Alert, oriented to self, place, month and year, good attention. Speech/language: Fluent, comprehension intact, object naming intact, repetition intact. Cranial nerves:   CN II Pupils equal and reactive to light, no VF deficits    CN III,IV,VI EOM intact, no gaze preference or deviation, no nystagmus    CN V normal sensation in V1, V2, and V3 segments bilaterally    CN VII no asymmetry, no nasolabial fold flattening    CN VIII normal hearing to speech    CN IX & X normal palatal elevation, no uvular deviation    CN XI 5/5 head turn and 5/5 shoulder shrug bilaterally    CN XII midline tongue protrusion    Motor:  Muscle bulk: normal, tone normal, pronator drift none tremor none Mvmt Root Nerve  Muscle Right Left Comments  SA C5/6 Ax Deltoid 5 5   EF C5/6 Mc Biceps 5 5   EE C6/7/8 Rad Triceps 5 5   WF C6/7 Med FCR 5 5   WE C7/8 PIN ECU 5 5   F Ab C8/T1 U ADM/FDI 5 5   HF L1/2/3 Fem Illopsoas 5 5   KE L2/3/4 Fem Quad 5 5   DF L4/5 D Peron Tib Ant 5 5   PF S1/2 Tibial Grc/Sol 5 5    Reflexes:  Right Left Comments  Pectoralis      Biceps (C5/6) 1 1   Brachioradialis (C5/6) 1 1    Triceps (C6/7) 1 1    Patellar (L3/4) 1 1    Achilles (S1) 1 1    Hoffman      Plantar     Jaw jerk    Sensation:  Light touch Intact BL   Pin prick    Temperature    Vibration   Proprioception    Coordination/Complex Motor:  - Finger to Nose intact BL - Heel to shin intact BL - Rapid alternating movement slowed in L hand - Gait: deferred.  Labs   CBC:  Recent Labs  Lab 06/30/20 1456 06/30/20 1527  WBC 11.1*  --   NEUTROABS 6.1  --   HGB 15.8 17.3*  HCT 49.5 51.0   MCV 79.7*  --   PLT 239  --     Basic Metabolic Panel:  Lab Results  Component Value Date   NA 139 06/30/2020   K 3.5 06/30/2020   CO2 24 06/30/2020   GLUCOSE 144 (H) 06/30/2020  BUN 22 06/30/2020   CREATININE 1.70 (H) 06/30/2020   CALCIUM 9.6 06/30/2020   GFRNONAA 42 (L) 06/30/2020   GFRAA 74 05/31/2007   Lipid Panel:  Lab Results  Component Value Date   LDLCALC 51 01/28/2020   HgbA1c:  Lab Results  Component Value Date   HGBA1C 7.9 (A) 05/26/2020   Urine Drug Screen: No results found for: LABOPIA, COCAINSCRNUR, LABBENZ, AMPHETMU, THCU, LABBARB  Alcohol Level No results found for: [facility]   MR BRAIN WO CONTRAST IMPRESSION: 1. Small acute infarct of the base of the right precentral gyrus. 2. Small area of acute ischemia in the right occipital cortex. 3. Generalized atrophy and findings of chronic small vessel disease.   Impression   Chavez Rosol is a 69 y.o. male with PMH significant for DM2, HTN, HLD, OA, smoking who presents wit ha 30 mins episode of L hand clumsiness and found to have 2 small strokes in the R precentral gyrus and R occipital cortex. His neurologic examination is notable for mild L hand clumsiness with rapid alternating movements.  Recommendations  Plan:  - Frequent Neuro checks per stroke unit protocol - Recommend Vascular imaging with MRA Angio Head without contrast and US Carotid doppler - Recommend obtaining TTE - Recommend obtaining Lipid panel with LDL - Please start statin if LDL > 70 - Recommend HbA1c - Antithrombotic - Aspirin 325mg  daily along with Plavix x 3 weeks, then Aspirin alone. - Recommend DVT ppx - SBP goal - permissive hypertension first 24 h < 220/110. Held home meds.  - Recommend Telemetry monitoring for arrythmia - Recommend bedside swallow screen prior to PO intake. - Stroke education booklet - Recommend PT/OT/SLP consult  ______________________________________________________________________   Thank you for the  opportunity to take part in the care of this patient. If you have any further questions, please contact the neurology consultation attending.  Signed,  [facility] Pager Number [phone] [phone]

## 2020-06-30 NOTE — H&P (Signed)
History and Physical    Roberto Rivera KZS:[id number] DOB: 07-Mar-1951 DOA: 06/30/2020  PCP: Corwin Levins, MD  Patient coming from: Home via urgent care  I have personally briefly reviewed patient's old medical records in [facility]  Chief Complaint: Left hand weakness  HPI: Roberto Rivera is a 69 y.o. male with medical history significant for T2DM, CKD stage III, HTN, HLD, and tobacco use who presents to the ED for evaluation of left hand weakness.  Patient states over the last 2 weeks he has noticed intermittent tingling sensation up and down his left arm as well as around his ear.  He says this most notably has occurred when he is using the computer.  Last night he went to bed in his usual state of health and when he woke this morning ~0930 he had new weakness in his left hand with feeling of slow movement and poor coordination.  He checked his blood pressure and saw his systolic BP in the 140s.  He took his home antihypertensives and rechecked his blood pressure with systolic in the 120s.  He says he thinks he might have had some left leg weakness but is not sure.  He initially went to urgent care for further evaluation and subsequently was directed to the ED.  He says his left hand weakness and discoordination has completely resolved now.  He otherwise denies any lightheadedness/dizziness, change in vision, nausea, vomiting, chest pain, palpitation, dyspnea, cough, abdominal pain, dysuria, or diarrhea.  He denies any weakness or change in sensation in his other extremities.  He is a current smoker of 1 pack/day for nearly 50 years.  He reports a history of stroke in his sister.  He is not currently on any antiplatelet therapy.  ED Course:  Initial vitals showed BP 141/91, pulse 88, RR 18, temp 98.0 Fahrenheit, SPO2 98% on room air.  Labs show sodium 135, potassium 3.5, bicarb 24, BUN 20, creatinine 1.74 (baseline creatinine ~1.4-1.5), serum glucose 150, LFTs within normal limits, WBC 11.1,  hemoglobin 15.8, platelets 239,000.  SARS-CoV-2 PCR panel is ordered and pending.  CT head without contrast was negative for evidence of acute intracranial abnormality.  Portable chest x-ray was negative for focal consolidation, edema, or effusion.  MRI brain without contrast showed small acute infarct of the base of the right precentral gyrus and small area of acute ischemia in the right occipital cortex.  Generalized atrophy and findings of chronic small vessel disease are noted.  Patient was given 1 L normal saline.  EDP consulted on-call neurology.  The hospitalist service was consulted to admit for further evaluation management.  Review of Systems: All systems reviewed and are negative except as documented in history of present illness above.   Past Medical History:  Diagnosis Date  . ALLERGIC RHINITIS 04/01/2007  . Diabetes mellitus   . DIABETES MELLITUS, TYPE II 04/01/2007  . 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 Hypercholesteremia   . HYPERCHOLESTEROLEMIA 02/07/2008  . Hypertension   . HYPERTENSION 04/01/2007  . MICROCYTOSIS 02/07/2008  . OSTEOARTHRITIS, SPINE 02/07/2008  . THYROID CYST 02/07/2008    No past surgical history on file.  Social History:  reports that he has been smoking cigarettes. He has been smoking about 1.00 pack per day. He has never used smokeless tobacco. He reports that he does not drink alcohol and does not use drugs.  No Known Allergies  Family History  Problem Relation Age of Onset  . Cancer Mother  had uncertain type of cancer     Prior to Admission medications   Medication Sig Start Date End Date Taking? Authorizing Provider  Accu-Chek FastClix Lancets MISC USE TO CHECK BLOOD SUGAR AS DIRECTED ONCE DAILY 03/24/19   Romero Belling, MD  ACCU-CHEK GUIDE test strip USE AS DIRECTED TO TEST BLOOD SUGAR ONCE DAILY 01/31/20   Romero Belling, MD  amLODipine (NORVASC) 10 MG tablet TAKE 1 TABLET(10 MG) BY MOUTH DAILY 01/28/20   Corwin Levins, MD    Blood Glucose Monitoring Suppl (ACCU-CHEK AVIVA) device Use to monitor glucose levels daily; E11.21 12/27/18 12/27/19  Romero Belling, MD  Candesartan Cilexetil-HCTZ 32-25 MG TABS 1 tab by mouth once daily 01/28/20   Corwin Levins, MD  metFORMIN (GLUCOPHAGE-XR) 500 MG 24 hr tablet TAKE 1 TABLET(500 MG) BY MOUTH DAILY WITH BREAKFAST 03/17/20   Romero Belling, MD  metoprolol succinate (TOPROL-XL) 50 MG 24 hr tablet TAKE 1 TABLET BY MOUTH DAILY. TAKE WITH OR IMMEDIATELY FOLLOWING A MEAL 01/28/20   Corwin Levins, MD  potassium chloride (KLOR-CON) 8 MEQ tablet 2 tab by mouth once daily 01/28/20   Corwin Levins, MD  repaglinide (PRANDIN) 1 MG tablet Take 1 tablet (1 mg total) by mouth 2 (two) times daily before a meal. 05/26/20   Romero Belling, MD  rosuvastatin (CRESTOR) 5 MG tablet 1 tab by mouth once daily 01/28/20   Corwin Levins, MD  tadalafil (CIALIS) 20 MG tablet Take 20 mg by mouth daily as needed for erectile dysfunction.    [provider]  tizanidine (ZANAFLEX) 2 MG capsule Take 1 capsule (2 mg total) by mouth 3 (three) times daily as needed for muscle spasms. 08/26/19   Corwin Levins, MD  Vitamin D, Ergocalciferol, (DRISDOL) 1.25 MG (50000 UNIT) CAPS capsule Take 1 capsule (50,000 Units total) by mouth every 7 (seven) days. 01/31/20   Corwin Levins, MD    Physical Exam: Vitals:   06/30/20 1452 06/30/20 1656  BP: (!) 141/91 133/79  Pulse: 88 76  Resp: 18 19  Temp: 98 F (36.7 C) 98.5 F (36.9 C)  TempSrc: Oral Oral  SpO2: 98% 99%   Constitutional: NAD, calm, comfortable Eyes: PERRL, lids and conjunctivae normal ENMT: Mucous membranes are moist. Posterior pharynx clear of any exudate or lesions.poor dentition.  Neck: normal, supple, no masses. Respiratory: clear to auscultation bilaterally, no wheezing, no crackles. Normal respiratory effort. No accessory muscle use.  Cardiovascular: Regular rate and rhythm, no murmurs / rubs / gallops. No extremity edema. 2+ pedal pulses. Abdomen: no  tenderness, no masses palpated. No hepatosplenomegaly. Bowel sounds positive.  Musculoskeletal: no clubbing / cyanosis. No joint deformity upper and lower extremities. Good ROM, no contractures. Normal muscle tone.  Skin: no rashes, lesions, ulcers. No induration Neurologic: CN 2-12 grossly intact. Sensation intact, DTR normal. Strength 5/5 in all 4.  FTN intact. Psychiatric: Normal judgment and insight. Alert and oriented x 3. Normal mood.   Labs on Admission: I have personally reviewed following labs and imaging studies  CBC: Recent Labs  Lab 06/30/20 1456 06/30/20 1527  WBC 11.1*  --   NEUTROABS 6.1  --   HGB 15.8 17.3*  HCT 49.5 51.0  MCV 79.7*  --   PLT 239  --    Basic Metabolic Panel: Recent Labs  Lab 06/30/20 1456 06/30/20 1527  NA 135 139  K 3.5 3.5  CL 98 99  CO2 24  --   GLUCOSE 150* 144*  BUN 20 22  CREATININE 1.74* 1.70*  CALCIUM 9.6  --    GFR: CrCl cannot be calculated (Unknown ideal weight.). Liver Function Tests: Recent Labs  Lab 06/30/20 1456  AST 17  ALT 14  ALKPHOS 82  BILITOT 0.8  PROT 7.3  ALBUMIN 3.7   No results for input(s): LIPASE, AMYLASE in the last 168 hours. No results for input(s): AMMONIA in the last 168 hours. Coagulation Profile: Recent Labs  Lab 06/30/20 1619  INR 1.0   Cardiac Enzymes: No results for input(s): CKTOTAL, CKMB, CKMBINDEX, TROPONINI in the last 168 hours. BNP (last 3 results) No results for input(s): PROBNP in the last 8760 hours. HbA1C: No results for input(s): HGBA1C in the last 72 hours. CBG: Recent Labs  Lab 06/30/20 1702  GLUCAP 114*   Lipid Profile: No results for input(s): CHOL, HDL, LDLCALC, TRIG, CHOLHDL, LDLDIRECT in the last 72 hours. Thyroid Function Tests: No results for input(s): TSH, T4TOTAL, FREET4, T3FREE, THYROIDAB in the last 72 hours. Anemia Panel: No results for input(s): VITAMINB12, FOLATE, FERRITIN, TIBC, IRON, RETICCTPCT in the last 72 hours. Urine analysis:    Component  Value Date/Time   COLORURINE YELLOW 01/28/2020 0956   APPEARANCEUR CLEAR 01/28/2020 0956   LABSPEC 1.020 01/28/2020 0956   PHURINE 5.5 01/28/2020 0956   GLUCOSEU 100 (A) 01/28/2020 0956   HGBUR NEGATIVE 01/28/2020 0956   BILIRUBINUR NEGATIVE 01/28/2020 0956   KETONESUR NEGATIVE 01/28/2020 0956   PROTEINUR 30 (A) 01/17/2018 1139   UROBILINOGEN 0.2 01/28/2020 0956   NITRITE NEGATIVE 01/28/2020 0956   LEUKOCYTESUR NEGATIVE 01/28/2020 0956    Radiological Exams on Admission: CT HEAD WO CONTRAST  Result Date: 06/30/2020 CLINICAL DATA:  Transient ischemic attack. Intermittent weakness in left arm, radiating to back of neck and left ear for 1 week. EXAM: CT HEAD WITHOUT CONTRAST TECHNIQUE: Contiguous axial images were obtained from the base of the skull through the vertex without intravenous contrast. COMPARISON:  No pertinent prior exams are available for comparison. FINDINGS: Brain: Mild generalized cerebral atrophy. Moderate ill-defined hypoattenuation within the cerebral white matter is nonspecific, but compatible with chronic small vessel ischemic disease. There is no acute intracranial hemorrhage. No demarcated cortical infarct. No extra-axial fluid collection. No evidence of intracranial mass. No midline shift. Vascular: No hyperdense vessel.  Atherosclerotic calcifications. Skull: Normal. Negative for fracture or focal lesion. Sinuses/Orbits: Visualized orbits show no acute finding. Moderate mucosal thickening within the inferior left maxillary sinus. Mild scattered paranasal sinus mucosal thickening elsewhere. IMPRESSION: No evidence of acute intracranial abnormality. Mild cerebral atrophy with moderate chronic small vessel ischemic disease. Paranasal mucosal thickening as described, most notably left maxillary. Electronically Signed   By: Jackey Loge DO   On: 06/30/2020 16:17   MR BRAIN WO CONTRAST  Result Date: 06/30/2020 CLINICAL DATA:  Transient ischemia with intermittent left arm  weakness. EXAM: MRI HEAD WITHOUT CONTRAST TECHNIQUE: Multiplanar, multiecho pulse sequences of the brain and surrounding structures were obtained without intravenous contrast. COMPARISON:  Head CT 06/30/2020 FINDINGS: Brain: Small acute infarct of the base of the right precentral gyrus. Small area of abnormal diffusion restriction in the right occipital cortex. Multifocal hyperintense T2-weighted signal within the white matter. There is generalized atrophy without lobar predilection. No chronic microhemorrhage. Normal midline structures. Vascular: Normal flow voids. Skull and upper cervical spine: Normal marrow signal. Sinuses/Orbits: Left maxillary retention cyst.  Normal orbits. Other: None. IMPRESSION: 1. Small acute infarct of the base of the right precentral gyrus. 2. Small area of acute ischemia in the right occipital cortex.  3. Generalized atrophy and findings of chronic small vessel disease. Electronically Signed   By: Deatra RobinsonKevin  Herman M.D.   On: 06/30/2020 19:04   DG Chest Port 1 View  Result Date: 06/30/2020 CLINICAL DATA:  Left upper extremity weakness, hypertension, tobacco abuse EXAM: PORTABLE CHEST 1 VIEW COMPARISON:  None. FINDINGS: Two frontal views of the chest demonstrate an unremarkable cardiac silhouette. No airspace disease, effusion, or pneumothorax. Interstitial prominence likely due to tobacco abuse. IMPRESSION: 1. No acute intrathoracic process. Electronically Signed   By: Sharlet SalinaMichael  Brown M.D.   On: 06/30/2020 18:41    EKG: Personally reviewed. Normal sinus rhythm without acute ischemic changes.  T wave inversions inferolateral leads no longer present when compared to prior.  Assessment/Plan Principal Problem:   Acute CVA (cerebrovascular accident) (HCC) Active Problems:   Hypertension associated with diabetes (HCC)   Diabetes (HCC)   Hyperlipidemia associated with type 2 diabetes mellitus (HCC)   Acute kidney injury superimposed on chronic kidney disease (HCC)  Roberto Rivera is a  69 y.o. male with medical history significant for T2DM, CKD stage III, HTN, HLD, and tobacco use who is admitted with acute right precentral and right occipital strokes.  Acute CVA: MRI brain showing acute right precentral and right occipital strokes.  Symptoms of left hand weakness and poor coordination resolved at time of admission. -Neurology consulted -Started on aspirin 325 mg and Plavix 75 mg daily -Obtain echocardiogram -Carotid Dopplers -Monitor on telemetry, continue neurochecks -Increase home rosuvastatin 10 mg daily -PT/OT/SLP eval -Allow permissive hypertension for now -A1c 7.9% on 05/26/2020, check lipid panel  AKI on CKD stage III: Creatinine 1.74 on admission compared to most recent creatinine 1.40 on 01/28/20.  Likely multifactorial from medication effect and comorbidities.  He was given 1 L NS in the ED. -Holding home Metformin, candesartan-HCTZ -Repeat labs in a.m.  Hypertension: Holding home amlodipine, candesartan-HCTZ, and Toprol-XL for now to allow for permissive hypertension.  Type 2 diabetes: Hemoglobin A1c 7.9% on 05/26/20.  Holding home Metformin and Prandin for now.  Place on sensitive SSI and adjust as needed.  Hyperlipidemia: Increase home rosuvastatin to 10 mg daily.  Tobacco use: Reports smoking 1 pack/day for nearly 50 years.  Patient advised on smoking cessation.  He declines nicotine patch at time of admission.   DVT prophylaxis: Lovenox Code Status: Full code, confirmed with patient Family Communication: Discussed with patient, he has discussed with family Disposition Plan: From home and likely discharge to home pending stroke work-up Consults called: Neurology Admission status:  Status is: Observation  The patient remains OBS appropriate and will d/c before 2 midnights.  Dispo: The patient is from: Home              Anticipated d/c is to: Home              Anticipated d/c date is: 1 day              Patient currently is not medically stable  to d/c.  Darreld McleanVishal Sherrill Mckamie MD [facility]  If 7PM-7AM, please contact night-coverage [email or web address]  06/30/2020, 7:37 PM

## 2020-06-30 NOTE — ED Triage Notes (Signed)
Pt sent here from [facility] for further eval of weakness and tingling in L arm. Symptoms began at 0930 this morning and have resolved completely. NIHSS 0 in triage.

## 2020-06-30 NOTE — ED Provider Notes (Signed)
[facility]   CSN: [id number] Arrival date & time: 06/30/20  1446     History Chief Complaint  Patient presents with  . Weakness  . Tingling    Roberto ClevelandRoy Rivera is a 69 y.o. male.  HPI   Roberto Rivera is a 69 y.o. gentleman with PMHx type II DM with proteinuria and CKD stage III, HTN, HLD, hypokalemia, microcytosis, and thyroid cyst, presenting due to an episode of tingling and weakness of the right upper extremity earlier this morning that has resolved. He states that he woke up and reached over to take his pills this morning, but noticed left ear tingling and abnormal sensation "as if his muscles were pulling" around his left ear. He then noticed tingling in his left neck and throughout his left arm, and his left hand felt weak. He states his left hand felt clumsy as if he didn't have full control over it and that it would not move as quickly as his right hand when he tried to wash his hands. He is unsure how long his symptoms lasted but states that he now feels back to his usual self, without symptoms. He notes a similar episode of ear, left neck, and left arm tingling and numbness once last week that lasted about 20 minutes. He has had short episodes of ear tingling and abnormal sensation in the past lasting minutes at a time. He notes that he had left upper back pain last week, but attributed his pain to gas pain, as his stomach was louder than usual and his pain resolved after taking Rolace. He endorses feeling shaky which he attributes to anxiety, but otherwise denies any difficulty speaking, other numbness or tingling, CP, palpitations, nausea, vomiting, SOB, cough, or any other symptoms. He denies any known neurological or cardiac history.   Past Medical History:  Diagnosis Date  . ALLERGIC RHINITIS 04/01/2007  . Diabetes mellitus   . DIABETES MELLITUS, TYPE II 04/01/2007  . 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 Hypercholesteremia   .  HYPERCHOLESTEROLEMIA 02/07/2008  . Hypertension   . HYPERTENSION 04/01/2007  . MICROCYTOSIS 02/07/2008  . OSTEOARTHRITIS, SPINE 02/07/2008  . THYROID CYST 02/07/2008    Patient Active Problem List   Diagnosis Date Noted  . Acute CVA (cerebrovascular accident) (HCC) 06/30/2020  . Hyperlipidemia associated with type 2 diabetes mellitus (HCC) 06/30/2020  . Acute kidney injury superimposed on chronic kidney disease (HCC) 06/30/2020  . Low back pain 08/26/2019  . CKD (chronic kidney disease) stage 3, GFR 30-59 ml/min (HCC) 02/05/2019  . Left groin pain 02/05/2019  . Hypokalemia 06/26/2017  . Back pain 05/24/2017  . Abnormal ECG 04/25/2017  . Screening examination for infectious disease 03/10/2016  . Skin nodule 02/26/2015  . Preventative health care 04/26/2013  . Rash and nonspecific skin eruption 04/26/2013  . Left knee pain 04/11/2013  . Diabetes (HCC) 10/13/2011  . Screening for prostate cancer 10/13/2011  . Encounter for long-term (current) use of other medications 10/13/2011  . SMOKER 02/04/2010  . Edema 02/04/2010  . PROTEINURIA, MILD 01/29/2009  . THYROID CYST 02/07/2008  . HYPERCHOLESTEROLEMIA 02/07/2008  . 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 OSTEOARTHRITIS, SPINE 02/07/2008  . MICROCYTOSIS 02/07/2008  . Hypertension associated with diabetes (HCC) 04/01/2007  . ALLERGIC RHINITIS 04/01/2007    No past surgical history on file.     Family History  Problem Relation Age of Onset  . Cancer Mother        had uncertain type of  cancer    Social History   Tobacco Use  . Smoking status: Current Every Day Smoker    Packs/day: 1.00    Types: Cigarettes  . Smokeless tobacco: Never Used  Substance Use Topics  . Alcohol use: No    Alcohol/week: 0.0 standard drinks  . Drug use: No    Home Medications Prior to Admission medications   Medication Sig Start Date End Date Taking? Authorizing Provider  Accu-Chek FastClix Lancets MISC USE TO CHECK BLOOD SUGAR AS  DIRECTED ONCE DAILY 03/24/19   Romero Belling, MD  ACCU-CHEK GUIDE test strip USE AS DIRECTED TO TEST BLOOD SUGAR ONCE DAILY 01/31/20   Romero Belling, MD  amLODipine (NORVASC) 10 MG tablet TAKE 1 TABLET(10 MG) BY MOUTH DAILY 01/28/20   Corwin Levins, MD  Blood Glucose Monitoring Suppl (ACCU-CHEK AVIVA) device Use to monitor glucose levels daily; E11.21 12/27/18 12/27/19  Romero Belling, MD  Candesartan Cilexetil-HCTZ 32-25 MG TABS 1 tab by mouth once daily 01/28/20   Corwin Levins, MD  metFORMIN (GLUCOPHAGE-XR) 500 MG 24 hr tablet TAKE 1 TABLET(500 MG) BY MOUTH DAILY WITH BREAKFAST 03/17/20   Romero Belling, MD  metoprolol succinate (TOPROL-XL) 50 MG 24 hr tablet TAKE 1 TABLET BY MOUTH DAILY. TAKE WITH OR IMMEDIATELY FOLLOWING A MEAL 01/28/20   Corwin Levins, MD  potassium chloride (KLOR-CON) 8 MEQ tablet 2 tab by mouth once daily 01/28/20   Corwin Levins, MD  repaglinide (PRANDIN) 1 MG tablet Take 1 tablet (1 mg total) by mouth 2 (two) times daily before a meal. 05/26/20   Romero Belling, MD  rosuvastatin (CRESTOR) 5 MG tablet 1 tab by mouth once daily 01/28/20   Corwin Levins, MD  tadalafil (CIALIS) 20 MG tablet Take 20 mg by mouth daily as needed for erectile dysfunction.    [provider]  tizanidine (ZANAFLEX) 2 MG capsule Take 1 capsule (2 mg total) by mouth 3 (three) times daily as needed for muscle spasms. 08/26/19   Corwin Levins, MD  Vitamin D, Ergocalciferol, (DRISDOL) 1.25 MG (50000 UNIT) CAPS capsule Take 1 capsule (50,000 Units total) by mouth every 7 (seven) days. 01/31/20   Corwin Levins, MD    Allergies    Patient has no known allergies.  Review of Systems   Review of Systems: 10 point review of systems otherwise negative except as noted above in HPI.   Physical Exam Updated Vital Signs BP 135/73   Pulse 84   Temp 98.5 F (36.9 C) (Oral)   Resp 18   SpO2 99%   Physical Exam   General: Patient appears well. No acute distress. Eyes: Sclera non-icteric. No conjunctival injection.    HENT: There is bilateral non-tender lymphadenopathy of the submandibular lymph nodes. Neck is otherwise supple. No nasal discharge. Moist mucus membranes. Respiratory: There are end-expiratory wheezes throughout the right lung. The left lung is CTA. No rales or rhonchi. No tachypnea or increased work of breathing.  Cardiovascular: Regular rate and rhythm. No murmurs, rubs, or gallops. No significant lower extremity edema.  Abdominal: Abdomen is obese and distended, but soft, without tenderness to palpation, guarding, or rebound. Bowel sounds intact. Neurological: Patient is alert and oriented. CN II-XII are grossly intact. Sensation is grossly intact to light touch in all four extremities.  Musculoskeletal: Strength is 5/5 in all four extremities with normal muscle bulk and tone. No tenderness to palpation of the left upper extremity, shoulder, or spine. Skin: There is a firm 2x2cm lesion without  fluctuance, erythema, tenderness, or active drainage on the right upper back. There are many seborrheic keratoses on patient's back. No other lesions or rashes.  Psych: Normal affect. Normal tone of voice.   ED Results / Procedures / Treatments   Labs (all labs ordered are listed, but only abnormal results are displayed) Labs Reviewed  CBC - Abnormal; Notable for the following components:      Result Value   WBC 11.1 (*)    RBC 6.21 (*)    MCV 79.7 (*)    MCH 25.4 (*)    RDW 15.8 (*)    All other components within normal limits  DIFFERENTIAL - Abnormal; Notable for the following components:   Lymphs Abs 4.1 (*)    All other components within normal limits  COMPREHENSIVE METABOLIC PANEL - Abnormal; Notable for the following components:   Glucose, Bld 150 (*)    Creatinine, Ser 1.74 (*)    GFR, Estimated 42 (*)    All other components within normal limits  I-STAT CHEM 8, ED - Abnormal; Notable for the following components:   Creatinine, Ser 1.70 (*)    Glucose, Bld 144 (*)    Hemoglobin 17.3  (*)    All other components within normal limits  CBG MONITORING, ED - Abnormal; Notable for the following components:   Glucose-Capillary 114 (*)    All other components within normal limits  RESPIRATORY PANEL BY RT PCR (FLU A&B, COVID)  PROTIME-INR  APTT    EKG EKG Interpretation  Date/Time:  Wednesday June 30 2020 14:52:55 EST Ventricular Rate:  82 PR Interval:  166 QRS Duration: 98 QT Interval:  392 QTC Calculation: 457 R Axis:   -4 Text Interpretation: Normal sinus rhythm Nonspecific T wave abnormality Abnormal ECG No previous ECGs available Confirmed by Richardean Canal [phone]) on 06/30/2020 4:37:44 PM   Radiology CT HEAD WO CONTRAST  Result Date: 06/30/2020 CLINICAL DATA:  Transient ischemic attack. Intermittent weakness in left arm, radiating to back of neck and left ear for 1 week. EXAM: CT HEAD WITHOUT CONTRAST TECHNIQUE: Contiguous axial images were obtained from the base of the skull through the vertex without intravenous contrast. COMPARISON:  No pertinent prior exams are available for comparison. FINDINGS: Brain: Mild generalized cerebral atrophy. Moderate ill-defined hypoattenuation within the cerebral white matter is nonspecific, but compatible with chronic small vessel ischemic disease. There is no acute intracranial hemorrhage. No demarcated cortical infarct. No extra-axial fluid collection. No evidence of intracranial mass. No midline shift. Vascular: No hyperdense vessel.  Atherosclerotic calcifications. Skull: Normal. Negative for fracture or focal lesion. Sinuses/Orbits: Visualized orbits show no acute finding. Moderate mucosal thickening within the inferior left maxillary sinus. Mild scattered paranasal sinus mucosal thickening elsewhere. IMPRESSION: No evidence of acute intracranial abnormality. Mild cerebral atrophy with moderate chronic small vessel ischemic disease. Paranasal mucosal thickening as described, most notably left maxillary. Electronically Signed    By: Jackey Loge DO   On: 06/30/2020 16:17   MR BRAIN WO CONTRAST  Result Date: 06/30/2020 CLINICAL DATA:  Transient ischemia with intermittent left arm weakness. EXAM: MRI HEAD WITHOUT CONTRAST TECHNIQUE: Multiplanar, multiecho pulse sequences of the brain and surrounding structures were obtained without intravenous contrast. COMPARISON:  Head CT 06/30/2020 FINDINGS: Brain: Small acute infarct of the base of the right precentral gyrus. Small area of abnormal diffusion restriction in the right occipital cortex. Multifocal hyperintense T2-weighted signal within the white matter. There is generalized atrophy without lobar predilection. No chronic microhemorrhage. Normal midline structures. Vascular: Normal  flow voids. Skull and upper cervical spine: Normal marrow signal. Sinuses/Orbits: Left maxillary retention cyst.  Normal orbits. Other: None. IMPRESSION: 1. Small acute infarct of the base of the right precentral gyrus. 2. Small area of acute ischemia in the right occipital cortex. 3. Generalized atrophy and findings of chronic small vessel disease. Electronically Signed   By: Deatra Robinson M.D.   On: 06/30/2020 19:04   DG Chest Port 1 View  Result Date: 06/30/2020 CLINICAL DATA:  Left upper extremity weakness, hypertension, tobacco abuse EXAM: PORTABLE CHEST 1 VIEW COMPARISON:  None. FINDINGS: Two frontal views of the chest demonstrate an unremarkable cardiac silhouette. No airspace disease, effusion, or pneumothorax. Interstitial prominence likely due to tobacco abuse. IMPRESSION: 1. No acute intrathoracic process. Electronically Signed   By: Sharlet Salina M.D.   On: 06/30/2020 18:41    Procedures Procedures (including critical care time)  Medications Ordered in ED Medications  aspirin tablet 325 mg (has no administration in time range)  clopidogrel (PLAVIX) tablet 75 mg (has no administration in time range)  sodium chloride 0.9 % bolus 1,000 mL (1,000 mLs Intravenous New Bag/Given 06/30/20  1757)    ED Course  I have reviewed the triage vital signs and the nursing notes.  Pertinent labs & imaging results that were available during my care of the patient were reviewed by me and considered in my medical decision making (see chart for details).    MDM Rules/Calculators/A&P                          Patient's presentation of short, episodic right ear and upper extremity tingling with weakness that has resolved is concerning for TIA. He has multiple risk factors including HTN, HLD, DM, TUD, and abdominal obesity. CT head did show moderate chonic small vessel ischemia and mild cerebellar atrophy, but no acute intracranial abnormalities. Patient's right arm numbness and tingling could be explained by neuropathy, especially in the setting of recent left upper back pain; however, this would not explain patient's symptoms surrounding his ear. He denies any CP, SOB, diaphoresis, or other findings concerning for cardiac cause, and EKG did not show findings concerning for acute ischemia. Will check MRI brain without contrast to further assess.   CBC does show an elevated WBC of 11.1 with very mild lymphocytosis. He does have diffuse end-expiratory wheezing of the right lung, but the left lung is clear, without LE edema or signs of volume overload. Will check CXR to rule out pneumonia.   7:12pm: CXR shows interstitial prominence, likely due to tobacco abuse, but no acute intrathoracic process.  MR brain without contrast shows small acute infarcts of the right occipital cortex and the right precentral gyrus in addition to generalized atrophy and chronic small vessel disease. Patient's symptoms can be explained by the above, with acute ischemic stroke.   7:30pm: Neurology have been consulted who will see patient. Roberto Rivera will be admitted to the hospital for further workup of acute ischemic stroke.   Final Clinical Impression(s) / ED Diagnoses Final diagnoses:  Cerebrovascular accident (CVA),  unspecified mechanism [facility])    Rx / DC Orders ED Discharge Orders    None     Glenford Bayley, MD 06/30/2020, 7:49 PM Pager: [phone]    Glenford Bayley, MD 06/30/20 1949    Charlynne Pander, MD 06/30/20 2042

## 2020-06-30 NOTE — ED Triage Notes (Signed)
Pt reports having intermittent weakness in the left arm , radiates to the back of the neck and left ear x 1 week. States he noticed the weakness in the left arm when he was trying to grab a water bottle. Denies chest pain, vision changes, diarrhea, abdominal pain.   Pt think the weakness may be related to repaglinide 0.5 mg, he started 3 weeks ago. Blood sugar was 122 mg/dL before breakfast, BP 022/336

## 2020-07-01 ENCOUNTER — Encounter (HOSPITAL_COMMUNITY): Payer: Self-pay | Admitting: Internal Medicine

## 2020-07-01 ENCOUNTER — Observation Stay (HOSPITAL_COMMUNITY): Payer: Medicare Other

## 2020-07-01 DIAGNOSIS — E1159 Type 2 diabetes mellitus with other circulatory complications: Secondary | ICD-10-CM | POA: Diagnosis not present

## 2020-07-01 DIAGNOSIS — I63411 Cerebral infarction due to embolism of right middle cerebral artery: Secondary | ICD-10-CM | POA: Diagnosis present

## 2020-07-01 DIAGNOSIS — R29700 NIHSS score 0: Secondary | ICD-10-CM | POA: Diagnosis present

## 2020-07-01 DIAGNOSIS — E1122 Type 2 diabetes mellitus with diabetic chronic kidney disease: Secondary | ICD-10-CM | POA: Diagnosis present

## 2020-07-01 DIAGNOSIS — Z7984 Long term (current) use of oral hypoglycemic drugs: Secondary | ICD-10-CM | POA: Diagnosis not present

## 2020-07-01 DIAGNOSIS — E669 Obesity, unspecified: Secondary | ICD-10-CM | POA: Diagnosis present

## 2020-07-01 DIAGNOSIS — N189 Chronic kidney disease, unspecified: Secondary | ICD-10-CM

## 2020-07-01 DIAGNOSIS — E7800 Pure hypercholesterolemia, unspecified: Secondary | ICD-10-CM | POA: Diagnosis present

## 2020-07-01 DIAGNOSIS — R531 Weakness: Secondary | ICD-10-CM | POA: Diagnosis present

## 2020-07-01 DIAGNOSIS — Z6834 Body mass index (BMI) 34.0-34.9, adult: Secondary | ICD-10-CM | POA: Diagnosis not present

## 2020-07-01 DIAGNOSIS — Z79899 Other long term (current) drug therapy: Secondary | ICD-10-CM | POA: Diagnosis not present

## 2020-07-01 DIAGNOSIS — E1169 Type 2 diabetes mellitus with other specified complication: Secondary | ICD-10-CM

## 2020-07-01 DIAGNOSIS — I77810 Thoracic aortic ectasia: Secondary | ICD-10-CM | POA: Diagnosis present

## 2020-07-01 DIAGNOSIS — I129 Hypertensive chronic kidney disease with stage 1 through stage 4 chronic kidney disease, or unspecified chronic kidney disease: Secondary | ICD-10-CM | POA: Diagnosis present

## 2020-07-01 DIAGNOSIS — E785 Hyperlipidemia, unspecified: Secondary | ICD-10-CM | POA: Diagnosis present

## 2020-07-01 DIAGNOSIS — F17210 Nicotine dependence, cigarettes, uncomplicated: Secondary | ICD-10-CM | POA: Diagnosis present

## 2020-07-01 DIAGNOSIS — I639 Cerebral infarction, unspecified: Secondary | ICD-10-CM | POA: Diagnosis not present

## 2020-07-01 DIAGNOSIS — E1121 Type 2 diabetes mellitus with diabetic nephropathy: Secondary | ICD-10-CM | POA: Diagnosis not present

## 2020-07-01 DIAGNOSIS — N1832 Chronic kidney disease, stage 3b: Secondary | ICD-10-CM | POA: Diagnosis present

## 2020-07-01 DIAGNOSIS — I352 Nonrheumatic aortic (valve) stenosis with insufficiency: Secondary | ICD-10-CM | POA: Diagnosis not present

## 2020-07-01 DIAGNOSIS — J309 Allergic rhinitis, unspecified: Secondary | ICD-10-CM | POA: Diagnosis present

## 2020-07-01 DIAGNOSIS — N179 Acute kidney failure, unspecified: Secondary | ICD-10-CM | POA: Diagnosis not present

## 2020-07-01 DIAGNOSIS — I6389 Other cerebral infarction: Secondary | ICD-10-CM | POA: Diagnosis not present

## 2020-07-01 DIAGNOSIS — F419 Anxiety disorder, unspecified: Secondary | ICD-10-CM | POA: Diagnosis present

## 2020-07-01 DIAGNOSIS — I480 Paroxysmal atrial fibrillation: Secondary | ICD-10-CM | POA: Diagnosis present

## 2020-07-01 DIAGNOSIS — Z20822 Contact with and (suspected) exposure to covid-19: Secondary | ICD-10-CM | POA: Diagnosis present

## 2020-07-01 LAB — HIV ANTIBODY (ROUTINE TESTING W REFLEX): HIV Screen 4th Generation wRfx: NONREACTIVE

## 2020-07-01 LAB — CBC
HCT: 42.9 % (ref 39.0–52.0)
Hemoglobin: 13.8 g/dL (ref 13.0–17.0)
MCH: 25.2 pg — ABNORMAL LOW (ref 26.0–34.0)
MCHC: 32.2 g/dL (ref 30.0–36.0)
MCV: 78.4 fL — ABNORMAL LOW (ref 80.0–100.0)
Platelets: 209 10*3/uL (ref 150–400)
RBC: 5.47 MIL/uL (ref 4.22–5.81)
RDW: 15.4 % (ref 11.5–15.5)
WBC: 9.2 10*3/uL (ref 4.0–10.5)
nRBC: 0 % (ref 0.0–0.2)

## 2020-07-01 LAB — ECHOCARDIOGRAM COMPLETE
AR max vel: 3.06 cm2
AV Area VTI: 3.16 cm2
AV Area mean vel: 3.31 cm2
AV Mean grad: 14 mmHg
AV Peak grad: 21.5 mmHg
Ao pk vel: 2.32 m/s
Area-P 1/2: 2.6 cm2
Height: 71 in
P 1/2 time: 518 msec
S' Lateral: 2.9 cm
Weight: 3978.86 oz

## 2020-07-01 LAB — BASIC METABOLIC PANEL
Anion gap: 9 (ref 5–15)
BUN: 16 mg/dL (ref 8–23)
CO2: 25 mmol/L (ref 22–32)
Calcium: 8.9 mg/dL (ref 8.9–10.3)
Chloride: 104 mmol/L (ref 98–111)
Creatinine, Ser: 1.52 mg/dL — ABNORMAL HIGH (ref 0.61–1.24)
GFR, Estimated: 50 mL/min — ABNORMAL LOW (ref >60)
Glucose, Bld: 121 mg/dL — ABNORMAL HIGH (ref 70–99)
Potassium: 3.1 mmol/L — ABNORMAL LOW (ref 3.5–5.1)
Sodium: 138 mmol/L (ref 135–145)

## 2020-07-01 LAB — LIPID PANEL
Cholesterol: 79 mg/dL (ref 0–200)
HDL: 26 mg/dL — ABNORMAL LOW (ref >40)
LDL Cholesterol: 39 mg/dL (ref 0–99)
Total CHOL/HDL Ratio: 3 RATIO
Triglycerides: 72 mg/dL (ref <150)
VLDL: 14 mg/dL (ref 0–40)

## 2020-07-01 LAB — GLUCOSE, CAPILLARY
Glucose-Capillary: 116 mg/dL — ABNORMAL HIGH (ref 70–99)
Glucose-Capillary: 132 mg/dL — ABNORMAL HIGH (ref 70–99)
Glucose-Capillary: 181 mg/dL — ABNORMAL HIGH (ref 70–99)
Glucose-Capillary: 114 mg/dL — ABNORMAL HIGH (ref 70–99)

## 2020-07-01 MED ORDER — APIXABAN 5 MG PO TABS
5.0000 mg | ORAL_TABLET | Freq: Two times a day (BID) | ORAL | Status: DC
  Administered 2020-07-01 – 2020-07-02 (×2): 5 mg via ORAL
  Filled 2020-07-01 (×3): qty 1

## 2020-07-01 MED ORDER — METOPROLOL TARTRATE 25 MG PO TABS: 25.0000 mg | ORAL_TABLET | Freq: Three times a day (TID) | ORAL | Status: DC

## 2020-07-01 MED ORDER — METOPROLOL TARTRATE 25 MG PO TABS
25.0000 mg | ORAL_TABLET | Freq: Three times a day (TID) | ORAL | Status: DC
  Administered 2020-07-01 – 2020-07-02 (×2): 25 mg via ORAL
  Filled 2020-07-01 (×2): qty 1

## 2020-07-01 MED ORDER — METOPROLOL TARTRATE 5 MG/5ML IV SOLN: 5.0000 mg | INTRAVENOUS | Status: DC | PRN

## 2020-07-01 MED ORDER — DILTIAZEM HCL 30 MG PO TABS
30.0000 mg | ORAL_TABLET | Freq: Three times a day (TID) | ORAL | Status: DC
  Administered 2020-07-01 (×2): 30 mg via ORAL
  Filled 2020-07-01 (×2): qty 1

## 2020-07-01 MED ORDER — METOPROLOL TARTRATE 5 MG/5ML IV SOLN
INTRAVENOUS | Status: AC
  Administered 2020-07-01: 5 mg via INTRAVENOUS
  Filled 2020-07-01: qty 5

## 2020-07-01 MED ORDER — ASPIRIN EC 81 MG PO TBEC: 81.0000 mg | DELAYED_RELEASE_TABLET | Freq: Every day | ORAL | Status: DC

## 2020-07-01 MED ORDER — ROSUVASTATIN CALCIUM 20 MG PO TABS: 20.0000 mg | ORAL_TABLET | Freq: Every day | ORAL | Status: DC

## 2020-07-01 MED ORDER — STROKE: EARLY STAGES OF RECOVERY BOOK
Status: AC
  Filled 2020-07-01: qty 1

## 2020-07-01 MED ORDER — ROSUVASTATIN CALCIUM 5 MG PO TABS
5.0000 mg | ORAL_TABLET | Freq: Every day | ORAL | Status: DC
  Administered 2020-07-01: 5 mg via ORAL
  Filled 2020-07-01: qty 1

## 2020-07-01 MED ORDER — POTASSIUM CHLORIDE CRYS ER 20 MEQ PO TBCR
40.0000 meq | EXTENDED_RELEASE_TABLET | Freq: Once | ORAL | Status: AC
  Administered 2020-07-01: 40 meq via ORAL
  Filled 2020-07-01: qty 2

## 2020-07-01 NOTE — Progress Notes (Signed)
Informed by RN-patient now in Afib RVR with the rates in the 120's range. Vitals stable 12 lead EKG confirms Afib  Plan Start metoprolol 25 mg TID, with prn IV Metoprolol if heart rate >120-d/w Dr Genelle Bal to start Eliquis. If rate uncontrolled with beta blocker-will start cardizem gtt.

## 2020-07-01 NOTE — Progress Notes (Addendum)
PROGRESS NOTE        PATIENT DETAILS Name: Roberto Rivera Age: 69 y.o. Sex: male Date of Birth: 1951-05-28 Admit Date: 06/30/2020 Admitting Physician Charlsie Quest, MD ZOX:WRUE, [name], MD  Brief Narrative: Patient is a 69 y.o. male with history of DM-2, HTN, HLD, CKD stage IIIb, tobacco use-who presented with left-sided weakness-further evaluation revealed acute CVA.  Significant events: 11/10>> admit for left-hand weakness-found to have acute CVA on MRI.  Significant studies: 11/10>> CT head: No acute intracranial abnormality. 11/10>> MRI brain: Acute infarct in the right precentral gyrus, small acute ischemia in the right occipital cortex. 11/10>> MRI brain: Severe stenosis vs short segment occlusion of distal M1 of the right MCA. 11/10>> A1c: 7.3 11/11>> LDL: 39 11/11>> carotid Doppler: No significant stenosis 11/11>> Echo: Pending  Antimicrobial therapy: None  Microbiology data: None  Procedures : None  Consults: Neurology  DVT Prophylaxis : enoxaparin (LOVENOX) injection 40 mg Start: 06/30/20 2015  Subjective: Left hand weakness has resolved.  Speech is clear.  No chest pain or shortness of breath.  Assessment/Plan: Acute CVA: Felt to be embolic etiology-work-up as above-spoke with neurology-apparently TEE scheduled for tomorrow.  Telemetry negative for atrial fibrillation.  Left upper extremity weakness is completely resolved.  On aspirin/Plavix/high intensity statin-await further recommendations from neurology.  AKI on CKD stage IIIb: AKI likely hemodynamically mediated-improved with supportive care.  Avoid nephrotoxic agents.  Follow electrolytes periodically.  HLD: On statin.  HTN: BP stable-allow permissive hypertension-slowly resume antihypertensives accordingly.  DM-2: CBG stable-continue SSI-resume oral hypoglycemic agents on discharge-follow with PCP for further optimization.  Recent Labs    06/30/20 2258 07/01/20 0601  07/01/20 1229  GLUCAP 123* 116* 132*   Tobacco abuse: Counseled  Obesity: Estimated body mass index is 34.68 kg/m as calculated from the following:   Height as of this encounter:  (1.803 m).   Weight as of this encounter: 112.8 kg.    Diet: Diet Order            Diet heart healthy/carb modified Room service appropriate? Yes; Fluid consistency: Thin  Diet effective now                  Code Status: Full code   Family Communication: None at bedside  Disposition Plan: Status is: Observation  The patient will require care spanning > 2 midnights and should be moved to inpatient because: Inpatient level of care appropriate due to severity of illness  Dispo: The patient is from: Home              Anticipated d/c is to: Home              Anticipated d/c date is: 1 day              Patient currently is not medically stable to d/c.   Barriers to Discharge: Ongoing CVA work-up-TEE scheduled for tomorrow.  Antimicrobial agents: Anti-infectives (From admission, onward)   None       Time spent: 25-minutes-Greater than 50% of this time was spent in counseling, explanation of diagnosis, planning of further management, and coordination of care.  MEDICATIONS: Scheduled Meds: . aspirin  325 mg Oral Daily  . clopidogrel  75 mg Oral Daily  . enoxaparin (LOVENOX) injection  40 mg Subcutaneous Q24H  . insulin aspart  0-9 Units Subcutaneous TID WC  .  rosuvastatin  20 mg Oral q1800   Continuous Infusions: PRN Meds:.acetaminophen **OR** acetaminophen (TYLENOL) oral liquid 160 mg/5 mL **OR** acetaminophen, labetalol, senna-docusate   PHYSICAL EXAM: Vital signs: Vitals:   07/01/20 0430 07/01/20 0630 07/01/20 0719 07/01/20 1226  BP: 123/65 116/72 130/89 131/86  Pulse: 72 72 73 73  Resp:  18 18 18   Temp: 98 F (36.7 C) 97.8 F (36.6 C) 98.3 F (36.8 C) 97.9 F (36.6 C)  TempSrc: Oral Oral Oral Oral  SpO2: 100% 98% 97% 98%  Weight:      Height:       Filed  Weights   07/01/20 0231  Weight: 112.8 kg   Body mass index is 34.68 kg/m.   Gen Exam:Alert awake-not in any distress HEENT:atraumatic, normocephalic Chest: B/L clear to auscultation anteriorly CVS:S1S2 regular Abdomen:soft non tender, non distended Extremities:no edema Neurology: Non focal Skin: no rash  I have personally reviewed following labs and imaging studies  LABORATORY DATA: CBC: Recent Labs  Lab 06/30/20 1456 06/30/20 1527 07/01/20 0314  WBC 11.1*  --  9.2  NEUTROABS 6.1  --   --   HGB 15.8 17.3* 13.8  HCT 49.5 51.0 42.9  MCV 79.7*  --  78.4*  PLT 239  --  209    Basic Metabolic Panel: Recent Labs  Lab 06/30/20 1456 06/30/20 1527 07/01/20 0314  NA 135 139 138  K 3.5 3.5 3.1*  CL 98 99 104  CO2 24  --  25  GLUCOSE 150* 144* 121*  BUN 20 22 16   CREATININE 1.74* 1.70* 1.52*  CALCIUM 9.6  --  8.9    GFR: Estimated Creatinine Clearance: 59.4 mL/min (A) (by C-G formula based on SCr of 1.52 mg/dL (H)).  Liver Function Tests: Recent Labs  Lab 06/30/20 1456  AST 17  ALT 14  ALKPHOS 82  BILITOT 0.8  PROT 7.3  ALBUMIN 3.7   No results for input(s): LIPASE, AMYLASE in the last 168 hours. No results for input(s): AMMONIA in the last 168 hours.  Coagulation Profile: Recent Labs  Lab 06/30/20 1619  INR 1.0    Cardiac Enzymes: No results for input(s): CKTOTAL, CKMB, CKMBINDEX, TROPONINI in the last 168 hours.  BNP (last 3 results) No results for input(s): PROBNP in the last 8760 hours.  Lipid Profile: Recent Labs    07/01/20 0314  CHOL 79  HDL 26*  LDLCALC 39  TRIG 72  CHOLHDL 3.0    Thyroid Function Tests: No results for input(s): TSH, T4TOTAL, FREET4, T3FREE, THYROIDAB in the last 72 hours.  Anemia Panel: No results for input(s): VITAMINB12, FOLATE, FERRITIN, TIBC, IRON, RETICCTPCT in the last 72 hours.  Urine analysis:    Component Value Date/Time   COLORURINE YELLOW 01/28/2020 0956   APPEARANCEUR CLEAR 01/28/2020 0956     LABSPEC 1.020 01/28/2020 0956   PHURINE 5.5 01/28/2020 0956   GLUCOSEU 100 (A) 01/28/2020 0956   HGBUR NEGATIVE 01/28/2020 0956   BILIRUBINUR NEGATIVE 01/28/2020 0956   KETONESUR NEGATIVE 01/28/2020 0956   PROTEINUR 30 (A) 01/17/2018 1139   UROBILINOGEN 0.2 01/28/2020 0956   NITRITE NEGATIVE 01/28/2020 0956   LEUKOCYTESUR NEGATIVE 01/28/2020 0956    Sepsis Labs: Lactic Acid, Venous No results found for: LATICACIDVEN  MICROBIOLOGY: Recent Results (from the past 240 hour(s))  Respiratory Panel by RT PCR (Flu A&B, Covid) - Nasopharyngeal Swab     Status: None   Collection Time: 06/30/20  7:46 PM   Specimen: Nasopharyngeal Swab  Result Value Ref Range Status  SARS Coronavirus 2 by RT PCR NEGATIVE NEGATIVE Final    Comment: (NOTE) SARS-CoV-2 target nucleic acids are NOT DETECTED.  The SARS-CoV-2 RNA is generally detectable in upper respiratoy specimens during the acute phase of infection. The lowest concentration of SARS-CoV-2 viral copies this assay can detect is 131 copies/mL. A negative result does not preclude SARS-Cov-2 infection and should not be used as the sole basis for treatment or other patient management decisions. A negative result may occur with  improper specimen collection/handling, submission of specimen other than nasopharyngeal swab, presence of viral mutation(s) within the areas targeted by this assay, and inadequate number of viral copies (<131 copies/mL). A negative result must be combined with clinical observations, patient history, and epidemiological information. The expected result is Negative.  Fact Sheet for Patients:  https://www.moore.com/  Fact Sheet for Healthcare Providers:  https://www.young.biz/  This test is no t yet approved or cleared by the [place] FDA and  has been authorized for detection and/or diagnosis of SARS-CoV-2 by FDA under an Emergency Use Authorization (EUA). This EUA will  remain  in effect (meaning this test can be used) for the duration of the COVID-19 declaration under Section 564(b)(1) of the Act, 21 U.S.C. section 360bbb-3(b)(1), unless the authorization is terminated or revoked sooner.     Influenza A by PCR NEGATIVE NEGATIVE Final   Influenza B by PCR NEGATIVE NEGATIVE Final    Comment: (NOTE) The Xpert Xpress SARS-CoV-2/FLU/RSV assay is intended as an aid in  the diagnosis of influenza from Nasopharyngeal swab specimens and  should not be used as a sole basis for treatment. Nasal washings and  aspirates are unacceptable for Xpert Xpress SARS-CoV-2/FLU/RSV  testing.  Fact Sheet for Patients: https://www.moore.com/  Fact Sheet for Healthcare Providers: https://www.young.biz/  This test is not yet approved or cleared by the [place] FDA and  has been authorized for detection and/or diagnosis of SARS-CoV-2 by  FDA under an Emergency Use Authorization (EUA). This EUA will remain  in effect (meaning this test can be used) for the duration of the  Covid-19 declaration under Section 564(b)(1) of the Act, 21  U.S.C. section 360bbb-3(b)(1), unless the authorization is  terminated or revoked. Performed at [facility], [address] [place], [place] [address]     RADIOLOGY STUDIES/RESULTS: CT HEAD WO CONTRAST  Result Date: 06/30/2020 CLINICAL DATA:  Transient ischemic attack. Intermittent weakness in left arm, radiating to back of neck and left ear for 1 week. EXAM: CT HEAD WITHOUT CONTRAST TECHNIQUE: Contiguous axial images were obtained from the base of the skull through the vertex without intravenous contrast. COMPARISON:  No pertinent prior exams are available for comparison. FINDINGS: Brain: Mild generalized cerebral atrophy. Moderate ill-defined hypoattenuation within the cerebral white matter is nonspecific, but compatible with chronic small vessel ischemic disease. There is no acute  intracranial hemorrhage. No demarcated cortical infarct. No extra-axial fluid collection. No evidence of intracranial mass. No midline shift. Vascular: No hyperdense vessel.  Atherosclerotic calcifications. Skull: Normal. Negative for fracture or focal lesion. Sinuses/Orbits: Visualized orbits show no acute finding. Moderate mucosal thickening within the inferior left maxillary sinus. Mild scattered paranasal sinus mucosal thickening elsewhere. IMPRESSION: No evidence of acute intracranial abnormality. Mild cerebral atrophy with moderate chronic small vessel ischemic disease. Paranasal mucosal thickening as described, most notably left maxillary. Electronically Signed   By: Jackey Loge DO   On: 06/30/2020 16:17   MR ANGIO HEAD WO CONTRAST  Result Date: 06/30/2020 CLINICAL DATA:  Stroke.  Right  precentral gyrus infarct. EXAM: MRA HEAD WITHOUT CONTRAST TECHNIQUE: Angiographic images of the Circle of Willis were obtained using MRA technique without intravenous contrast. COMPARISON:  Brain MRI 07/01/2019 FINDINGS: POSTERIOR CIRCULATION: --Vertebral arteries: Normal --Inferior cerebellar arteries: Normal. --Basilar artery: Normal. --Superior cerebellar arteries: Normal. --Posterior cerebral arteries: Normal. ANTERIOR CIRCULATION: --Intracranial internal carotid arteries: Normal. --Anterior cerebral arteries (ACA): Normal. --Middle cerebral arteries (MCA): There is severe stenosis or short segment occlusion of the distal M1 segment of the right MCA. The distal branches are patent. Left MCA is normal. ANATOMIC VARIANTS: None IMPRESSION: Severe stenosis versus short segment occlusion of the distal M1 segment of the right MCA. Electronically Signed   By: Deatra Robinson M.D.   On: 06/30/2020 22:43   MR BRAIN WO CONTRAST  Result Date: 06/30/2020 CLINICAL DATA:  Transient ischemia with intermittent left arm weakness. EXAM: MRI HEAD WITHOUT CONTRAST TECHNIQUE: Multiplanar, multiecho pulse sequences of the brain and  surrounding structures were obtained without intravenous contrast. COMPARISON:  Head CT 06/30/2020 FINDINGS: Brain: Small acute infarct of the base of the right precentral gyrus. Small area of abnormal diffusion restriction in the right occipital cortex. Multifocal hyperintense T2-weighted signal within the white matter. There is generalized atrophy without lobar predilection. No chronic microhemorrhage. Normal midline structures. Vascular: Normal flow voids. Skull and upper cervical spine: Normal marrow signal. Sinuses/Orbits: Left maxillary retention cyst.  Normal orbits. Other: None. IMPRESSION: 1. Small acute infarct of the base of the right precentral gyrus. 2. Small area of acute ischemia in the right occipital cortex. 3. Generalized atrophy and findings of chronic small vessel disease. Electronically Signed   By: Deatra Robinson M.D.   On: 06/30/2020 19:04   DG Chest Port 1 View  Result Date: 06/30/2020 CLINICAL DATA:  Left upper extremity weakness, hypertension, tobacco abuse EXAM: PORTABLE CHEST 1 VIEW COMPARISON:  None. FINDINGS: Two frontal views of the chest demonstrate an unremarkable cardiac silhouette. No airspace disease, effusion, or pneumothorax. Interstitial prominence likely due to tobacco abuse. IMPRESSION: 1. No acute intrathoracic process. Electronically Signed   By: Sharlet Salina M.D.   On: 06/30/2020 18:41   VAS US CAROTID (at [facility] and [facility] only)  Result Date: 07/01/2020 Carotid Arterial Duplex Study Indications:       CVA. Risk Factors:      Hypertension, hyperlipidemia, Diabetes. Comparison Study:  No prior study Performing Technologist: Gertie Fey MHA, RDMS, RVT, RDCS  Examination Guidelines: A complete evaluation includes B-mode imaging, spectral Doppler, color Doppler, and power Doppler as needed of all accessible portions of each vessel. Bilateral testing is considered an integral part of a complete examination. Limited examinations for reoccurring indications may be  performed as noted.  Right Carotid Findings: +----------+--------+--------+--------+-----------------------+--------+           PSV cm/sEDV cm/sStenosisPlaque Description     Comments +----------+--------+--------+--------+-----------------------+--------+ CCA Prox  78      14                                              +----------+--------+--------+--------+-----------------------+--------+ CCA Distal40      11                                              +----------+--------+--------+--------+-----------------------+--------+ ICA Prox  17  7               smooth and heterogenous         +----------+--------+--------+--------+-----------------------+--------+ ICA Distal46      20                                              +----------+--------+--------+--------+-----------------------+--------+ ECA       59      13                                              +----------+--------+--------+--------+-----------------------+--------+ +----------+--------+-------+----------------+-------------------+           PSV cm/sEDV cmsDescribe        Arm Pressure (mmHG) +----------+--------+-------+----------------+-------------------+ [id number]             Multiphasic, WNL                    +----------+--------+-------+----------------+-------------------+ +---------+--------+--+--------+--+---------+ VertebralPSV cm/s42EDV cm/s12Antegrade +---------+--------+--+--------+--+---------+  Left Carotid Findings: +----------+--------+--------+--------+----------------------+--------+           PSV cm/sEDV cm/sStenosisPlaque Description    Comments +----------+--------+--------+--------+----------------------+--------+ CCA Prox  93      18                                             +----------+--------+--------+--------+----------------------+--------+ CCA Distal49      15                                              +----------+--------+--------+--------+----------------------+--------+ ICA Prox  18      7               focal and heterogenous         +----------+--------+--------+--------+----------------------+--------+ ICA Distal43      18                                             +----------+--------+--------+--------+----------------------+--------+ ECA       58      9                                              +----------+--------+--------+--------+----------------------+--------+ +----------+--------+--------+----------------+-------------------+           PSV cm/sEDV cm/sDescribe        Arm Pressure (mmHG) +----------+--------+--------+----------------+-------------------+ [id number]              Multiphasic, WNL                    +----------+--------+--------+----------------+-------------------+ +---------+--------+--+--------+-+---------+ VertebralPSV cm/s28EDV cm/s8Antegrade +---------+--------+--+--------+-+---------+   Summary: Right Carotid: Velocities in the right ICA are consistent with a 1-39% stenosis. Left Carotid: Velocities in the left ICA are consistent with a 1-39% stenosis. Vertebrals:  Bilateral vertebral arteries demonstrate antegrade flow. Subclavians: Normal flow hemodynamics were seen in bilateral subclavian  arteries. *See table(s) above for measurements and observations.  Electronically signed by Delia Heady MD on 07/01/2020 at 12:54:09 PM.    Final      LOS: 0 days   Jeoffrey Massed, MD  [facility]    To contact the attending provider between 7A-7P or the covering provider during after hours 7P-7A, please log into the web site [email or web address] and access using universal [place] password for that web site. If you do not have the password, please call the hospital operator.  07/01/2020, 1:18 PM

## 2020-07-01 NOTE — Progress Notes (Signed)
PT Cancellation Note  Patient Details Name: Roberto Rivera MRN: [id number] DOB: Mar 16, 1951   Cancelled Treatment:    Reason Eval/Treat Not Completed: PT screened, no needs identified, will sign off. Per discussion with OT the pt is ambulating around the room independently without issue and is functioning at his baseline level. Pt has no acute PT needs at this time. Acute PT signing off.   Arlyss Gandy 07/01/2020, 8:57 AM

## 2020-07-01 NOTE — Progress Notes (Signed)
ANTICOAGULATION CONSULT NOTE - Initial Consult  Pharmacy Consult for Eliquis Indication: Afib and CVA  No Known Allergies  Patient Measurements: Height: 5\' 11"  (180.3 cm) Weight: 112.8 kg (248 lb 10.9 oz) IBW/kg (Calculated) : 75.3  Vital Signs: Temp: 98.3 F (36.8 C) (11/11 1532) Temp Source: Oral (11/11 1532) BP: 141/104 (11/11 1532) Pulse Rate: 98 (11/11 1532)  Labs: Recent Labs    06/30/20 1456 06/30/20 1456 06/30/20 1527 06/30/20 1619 07/01/20 0314  HGB 15.8   < > 17.3*  --  13.8  HCT 49.5  --  51.0  --  42.9  PLT 239  --   --   --  209  APTT  --   --   --  31  --   LABPROT  --   --   --  13.0  --   INR  --   --   --  1.0  --   CREATININE 1.74*  --  1.70*  --  1.52*   < > = values in this interval not displayed.    Estimated Creatinine Clearance: 59.4 mL/min (A) (by C-G formula based on SCr of 1.52 mg/dL (H)).   Medical History: Past Medical History:  Diagnosis Date  . ALLERGIC RHINITIS 04/01/2007  . Diabetes mellitus   . DIABETES MELLITUS, TYPE II 04/01/2007  . 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 Hypercholesteremia   . HYPERCHOLESTEROLEMIA 02/07/2008  . Hypertension   . HYPERTENSION 04/01/2007  . MICROCYTOSIS 02/07/2008  . OSTEOARTHRITIS, SPINE 02/07/2008  . THYROID CYST 02/07/2008    Assessment: CC/HPI: CVA  PMH: DM-2, HTN, HLD, CKD stage IIIb, tobacco use  Anticoag:  New afib and s/p CVA. Scr 1.52 with CKD. Hgb 13.8. Plts 209  Goal of Therapy: Therapeutic oral anticoagulation   Plan:  D/c Lovenox Eliquis 5mg  BID   Annamaria Salah S. 02/09/2008, PharmD, BCPS Clinical Staff Pharmacist [email or web address] , Jlon Betker Stillinger 07/01/2020,4:40 PM

## 2020-07-01 NOTE — Progress Notes (Signed)
  Echocardiogram 2D Echocardiogram has been performed.  Tye Savoy 07/01/2020, 11:55 AM

## 2020-07-01 NOTE — Progress Notes (Signed)
Occupational Therapy Evaluation Patient Details Name: Roberto Rivera MRN: [id number] DOB: Dec 20, 1950 Today's Date: 07/01/2020    History of Present Illness y.o. male with medical history significant for T2DM, CKD stage III, HTN, HLD, and tobacco use who presents to the ED for evaluation of left hand weakness. MRI + small acute infarct R precentral gyrus; small acute infarct R occipital cortex   Clinical Impression   PTA pt lives with significant other and her brother and was independent with ADL/IADL tasks. Pt states "I feel better than I normally do". Pt functioning at baseline level. Pt reports his arm feels "normal". Pt states his vision was "a little blurry" but that has returned to "normal". Educated pt on signs/symtoms of stroke using BeFast. Pt verbalized understanding. Educated on lifestyle choices, such as smoking cessation and changes in diet/exercise to reduce risk of stroke. Pt verbalized understanding. OT signing off.     Follow Up Recommendations  No OT follow up    Equipment Recommendations  None recommended by OT    Recommendations for Other Services       Precautions / Restrictions Precautions Precautions: None      Mobility Bed Mobility Overal bed mobility: Independent                  Transfers Overall transfer level: Independent                    Balance Overall balance assessment: Independent                               Standardized Balance Assessment Standardized Balance Assessment : Dynamic Gait Index   Dynamic Gait Index Level Surface: Normal Change in Gait Speed: Normal Gait with Horizontal Head Turns: Normal Gait with Vertical Head Turns: Normal Gait and Pivot Turn: Normal Step Over Obstacle: Normal Step Around Obstacles: Normal Steps: Normal Total Score: 24     ADL either performed or assessed with clinical judgement   ADL Overall ADL's : At baseline                                         Pt's girlfriend does most of the driving; pt does not work and is very sedentary - uses his computer throughout the day     Vision Baseline Vision/History: No visual deficits Vision Assessment?: No apparent visual deficits  Ptcomplained of blurred vision "about a week ago" but "I thought it was my diabetes". Per pt, vision has returned to normal. No field deficits noted. Follows with regular eye care.     Perception Perception Perception Tested?:  (no deficits)   Praxis Praxis Praxis tested?: Within functional limits    Pertinent Vitals/Pain Pain Assessment: No/denies pain     Hand Dominance Right   Extremity/Trunk Assessment Upper Extremity Assessment Upper Extremity Assessment: Overall WFL for tasks assessed   Lower Extremity Assessment Lower Extremity Assessment: Overall WFL for tasks assessed   Cervical / Trunk Assessment Cervical / Trunk Assessment: Other exceptions (arthritis in back)   Communication Communication Communication: No difficulties   Cognition Arousal/Alertness: Awake/alert Behavior During Therapy: WFL for tasks assessed/performed Overall Cognitive Status: Within Functional Limits for tasks assessed  General Comments       Exercises     Shoulder Instructions      Home Living Family/patient expects to be discharged to:: Private residence Living Arrangements: Spouse/significant other Available Help at Discharge: Family;Available 24 hours/day (girlfriend/girlfriend's brother) Type of Home: Apartment Home Access: Stairs to enter Entrance Stairs-Number of Steps: 3 Entrance Stairs-Rails: Right Home Layout: One level     Bathroom Shower/Tub: IT trainer: Handicapped height Bathroom Accessibility: Yes How Accessible: Accessible via walker Home Equipment: Crutches          Prior Functioning/Environment Level of Independence: Independent        Comments:  drives; independent with finances/medicaiton management        OT Problem List: Obesity      OT Treatment/Interventions:      OT Goals(Current goals can be found in the care plan section) Acute Rehab OT Goals Patient Stated Goal: to go home OT Goal Formulation: All assessment and education complete, DC therapy  OT Frequency:     Barriers to D/C:            Co-evaluation              AM-PAC OT "6 Clicks" Daily Activity     Outcome Measure Help from another person eating meals?: None Help from another person taking care of personal grooming?: None Help from another person toileting, which includes using toliet, bedpan, or urinal?: None Help from another person bathing (including washing, rinsing, drying)?: None Help from another person to put on and taking off regular upper body clothing?: None Help from another person to put on and taking off regular lower body clothing?: None 6 Click Score: 24   End of Session Nurse Communication: Mobility status  Activity Tolerance: Patient tolerated treatment well Patient left: in bed;with call bell/phone within reach  OT Visit Diagnosis: Muscle weakness (generalized) (M62.81)                Time: 9528-4132 OT Time Calculation (min): 19 min Charges:  OT General Charges $OT Visit: 1 Visit OT Evaluation $OT Eval Low Complexity: 1 Low  Luisa Dago, OT/L   Acute OT Clinical Specialist Acute Rehabilitation Services Pager [phone] Office [phone]   [facility] 07/01/2020, 8:38 AM

## 2020-07-01 NOTE — Progress Notes (Signed)
STROKE TEAM PROGRESS NOTE   INTERVAL HISTORY Patient just back from vascular lab.  I have personally reviewed history of presenting illness with the patient, electronic medical records and imaging films in PACS.  He developed transient left upper extremity clumsiness and weakness about a week ago.  MRI scan shows tiny punctate right precentral cortex as well as right occipital embolic strokes.  MRA of the brain shows right middle cerebral artery stenosis in the M1 segment.  LDL cholesterol is 39 mg percent and hemoglobin A1c 7.3.  Echocardiogram shows normal ejection fraction with severe dilatation of left atrium.  Carotid ultrasound shows no significant extracranial stenosis.  Vitals:   07/01/20 0231 07/01/20 0430 07/01/20 0630 07/01/20 0719  BP: (!) 138/96 123/65 116/72 130/89  Pulse: 73 72 72 73  Resp: 18  18 18   Temp: 98.6 F (37 C) 98 F (36.7 C) 97.8 F (36.6 C) 98.3 F (36.8 C)  TempSrc: Oral Oral Oral Oral  SpO2: 98% 100% 98% 97%  Weight: 112.8 kg     Height: 5\' 11"  (1.803 m)      CBC:  Recent Labs  Lab 06/30/20 1456 06/30/20 1456 06/30/20 1527 07/01/20 0314  WBC 11.1*  --   --  9.2  NEUTROABS 6.1  --   --   --   HGB 15.8   < > 17.3* 13.8  HCT 49.5   < > 51.0 42.9  MCV 79.7*  --   --  78.4*  PLT 239  --   --  209   < > = values in this interval not displayed.   Basic Metabolic Panel:  Recent Labs  Lab 06/30/20 1456 06/30/20 1456 06/30/20 1527 07/01/20 0314  NA 135   < > 139 138  K 3.5   < > 3.5 3.1*  CL 98   < > 99 104  CO2 24  --   --  25  GLUCOSE 150*   < > 144* 121*  BUN 20   < > 22 16  CREATININE 1.74*   < > 1.70* 1.52*  CALCIUM 9.6  --   --  8.9   < > = values in this interval not displayed.   Lipid Panel:  Recent Labs  Lab 07/01/20 0314  CHOL 79  TRIG 72  HDL 26*  CHOLHDL 3.0  VLDL 14  LDLCALC 39   HgbA1c:  Recent Labs  Lab 06/30/20 2222  HGBA1C 7.3*   Urine Drug Screen: No results for input(s): LABOPIA, COCAINSCRNUR, LABBENZ,  AMPHETMU, THCU, LABBARB in the last 168 hours.  Alcohol Level No results for input(s): ETH in the last 168 hours.  IMAGING past 24 hours CT HEAD WO CONTRAST  Result Date: 06/30/2020 CLINICAL DATA:  Transient ischemic attack. Intermittent weakness in left arm, radiating to back of neck and left ear for 1 week. EXAM: CT HEAD WITHOUT CONTRAST TECHNIQUE: Contiguous axial images were obtained from the base of the skull through the vertex without intravenous contrast. COMPARISON:  No pertinent prior exams are available for comparison. FINDINGS: Brain: Mild generalized cerebral atrophy. Moderate ill-defined hypoattenuation within the cerebral white matter is nonspecific, but compatible with chronic small vessel ischemic disease. There is no acute intracranial hemorrhage. No demarcated cortical infarct. No extra-axial fluid collection. No evidence of intracranial mass. No midline shift. Vascular: No hyperdense vessel.  Atherosclerotic calcifications. Skull: Normal. Negative for fracture or focal lesion. Sinuses/Orbits: Visualized orbits show no acute finding. Moderate mucosal thickening within the inferior left maxillary sinus. Mild scattered  paranasal sinus mucosal thickening elsewhere. IMPRESSION: No evidence of acute intracranial abnormality. Mild cerebral atrophy with moderate chronic small vessel ischemic disease. Paranasal mucosal thickening as described, most notably left maxillary. Electronically Signed   By: Jackey Loge DO   On: 06/30/2020 16:17   MR ANGIO HEAD WO CONTRAST  Result Date: 06/30/2020 CLINICAL DATA:  Stroke.  Right precentral gyrus infarct. EXAM: MRA HEAD WITHOUT CONTRAST TECHNIQUE: Angiographic images of the Circle of Willis were obtained using MRA technique without intravenous contrast. COMPARISON:  Brain MRI 07/01/2019 FINDINGS: POSTERIOR CIRCULATION: --Vertebral arteries: Normal --Inferior cerebellar arteries: Normal. --Basilar artery: Normal. --Superior cerebellar arteries: Normal.  --Posterior cerebral arteries: Normal. ANTERIOR CIRCULATION: --Intracranial internal carotid arteries: Normal. --Anterior cerebral arteries (ACA): Normal. --Middle cerebral arteries (MCA): There is severe stenosis or short segment occlusion of the distal M1 segment of the right MCA. The distal branches are patent. Left MCA is normal. ANATOMIC VARIANTS: None IMPRESSION: Severe stenosis versus short segment occlusion of the distal M1 segment of the right MCA. Electronically Signed   By: Deatra Robinson M.D.   On: 06/30/2020 22:43   MR BRAIN WO CONTRAST  Result Date: 06/30/2020 CLINICAL DATA:  Transient ischemia with intermittent left arm weakness. EXAM: MRI HEAD WITHOUT CONTRAST TECHNIQUE: Multiplanar, multiecho pulse sequences of the brain and surrounding structures were obtained without intravenous contrast. COMPARISON:  Head CT 06/30/2020 FINDINGS: Brain: Small acute infarct of the base of the right precentral gyrus. Small area of abnormal diffusion restriction in the right occipital cortex. Multifocal hyperintense T2-weighted signal within the white matter. There is generalized atrophy without lobar predilection. No chronic microhemorrhage. Normal midline structures. Vascular: Normal flow voids. Skull and upper cervical spine: Normal marrow signal. Sinuses/Orbits: Left maxillary retention cyst.  Normal orbits. Other: None. IMPRESSION: 1. Small acute infarct of the base of the right precentral gyrus. 2. Small area of acute ischemia in the right occipital cortex. 3. Generalized atrophy and findings of chronic small vessel disease. Electronically Signed   By: Deatra Robinson M.D.   On: 06/30/2020 19:04   DG Chest Port 1 View  Result Date: 06/30/2020 CLINICAL DATA:  Left upper extremity weakness, hypertension, tobacco abuse EXAM: PORTABLE CHEST 1 VIEW COMPARISON:  None. FINDINGS: Two frontal views of the chest demonstrate an unremarkable cardiac silhouette. No airspace disease, effusion, or pneumothorax.  Interstitial prominence likely due to tobacco abuse. IMPRESSION: 1. No acute intrathoracic process. Electronically Signed   By: Sharlet Salina M.D.   On: 06/30/2020 18:41   VAS US CAROTID (at [facility] and [facility] only)  Result Date: 07/01/2020 Carotid Arterial Duplex Study Indications:       CVA. Risk Factors:      Hypertension, hyperlipidemia, Diabetes. Comparison Study:  No prior study Performing Technologist: Gertie Fey MHA, RDMS, RVT, RDCS  Examination Guidelines: A complete evaluation includes B-mode imaging, spectral Doppler, color Doppler, and power Doppler as needed of all accessible portions of each vessel. Bilateral testing is considered an integral part of a complete examination. Limited examinations for reoccurring indications may be performed as noted.  Right Carotid Findings: +----------+--------+--------+--------+-----------------------+--------+           PSV cm/sEDV cm/sStenosisPlaque Description     Comments +----------+--------+--------+--------+-----------------------+--------+ CCA Prox  78      14                                              +----------+--------+--------+--------+-----------------------+--------+  CCA Distal40      11                                              +----------+--------+--------+--------+-----------------------+--------+ ICA Prox  17      7               smooth and heterogenous         +----------+--------+--------+--------+-----------------------+--------+ ICA Distal46      20                                              +----------+--------+--------+--------+-----------------------+--------+ ECA       59      13                                              +----------+--------+--------+--------+-----------------------+--------+ +----------+--------+-------+----------------+-------------------+           PSV cm/sEDV cmsDescribe        Arm Pressure (mmHG)  +----------+--------+-------+----------------+-------------------+ [id number]             Multiphasic, WNL                    +----------+--------+-------+----------------+-------------------+ +---------+--------+--+--------+--+---------+ VertebralPSV cm/s42EDV cm/s12Antegrade +---------+--------+--+--------+--+---------+  Left Carotid Findings: +----------+--------+--------+--------+----------------------+--------+           PSV cm/sEDV cm/sStenosisPlaque Description    Comments +----------+--------+--------+--------+----------------------+--------+ CCA Prox  93      18                                             +----------+--------+--------+--------+----------------------+--------+ CCA Distal49      15                                             +----------+--------+--------+--------+----------------------+--------+ ICA Prox  18      7               focal and heterogenous         +----------+--------+--------+--------+----------------------+--------+ ICA Distal43      18                                             +----------+--------+--------+--------+----------------------+--------+ ECA       58      9                                              +----------+--------+--------+--------+----------------------+--------+ +----------+--------+--------+----------------+-------------------+           PSV cm/sEDV cm/sDescribe        Arm Pressure (mmHG) +----------+--------+--------+----------------+-------------------+ [id number]              Multiphasic, WNL                    +----------+--------+--------+----------------+-------------------+ +---------+--------+--+--------+-+---------+  VertebralPSV cm/s28EDV cm/s8Antegrade +---------+--------+--+--------+-+---------+   Summary: Right Carotid: Velocities in the right ICA are consistent with a 1-39% stenosis. Left Carotid: Velocities in the left ICA are consistent with a 1-39% stenosis.  Vertebrals:  Bilateral vertebral arteries demonstrate antegrade flow. Subclavians: Normal flow hemodynamics were seen in bilateral subclavian              arteries. *See table(s) above for measurements and observations.     Preliminary     PHYSICAL EXAM Pleasant obese middle-age African-American male not in distress. . Afebrile. Head is nontraumatic. Neck is supple without bruit.    Cardiac exam no murmur or gallop. Lungs are clear to auscultation. Distal pulses are well felt. Neurological Exam ;  Awake  Alert oriented x 3. Normal speech and language.eye movements full without nystagmus.fundi were not visualized. Vision acuity and fields appear normal. Hearing is normal. Palatal movements are normal. Face symmetric. Tongue midline. Normal strength, tone, reflexes and coordination. Normal sensation. Gait deferred.  ASSESSMENT/PLAN [name] is a 69 y.o. male with history of DM2, HTN, HLD, CKD III, OA, smoking presenting with recurrent episode of L sided clumsiness and tingling.    Stroke:   R MCA and R occipital infarcts felt to be embolic secondary to unknown source though cannot rule out large vessel disease given R M2 stenosis   CT head No acute abnormality. Small vessel disease. Atrophy. Sinus dz.  MRI  Small R precentral gyrus infarct. Small R occipital cortex infarct. Small vessel disease. Atrophy.   MRA  Severe distal R M2 stenosis   Carotid Doppler  B ICA 1-39% stenosis, VAs antegrade   2D Echo EF 60-65%. No source of embolus. LA dilated  TEE to look for embolic source. Arranged with [facility] for tomorrow.  (I have made patient NPO after midnight tonight).  Bilateral lower extremity venous dopplers pending   If TEE negative, a  [facility] electrophysiologist will consult and consider placement of an implantable loop recorder to evaluate for atrial fibrillation as etiology of stroke. This has been explained to patient/family by  Dr. Pearlean Brownie and they are agreeable.  LDL 39  HgbA1c 7.3  VTE prophylaxis - Lovenox 40 mg sq daily   No antithrombotic prior to admission, now on aspirin 325 mg daily and clopidogrel 75 mg daily. Will change to Eliquis given new diagnosis of atrial fibrillation.    Therapy recommendations:  No therapy needs  Disposition:  Return home  Hypertension  Stable . Permissive hypertension (OK if < 220/120) but gradually normalize in 5-7 days . Long-term BP goal normotensive  Hyperlipidemia  Home meds:  crestor 5  Now on crestor 20  LDL 39, goal < 70  Will resume home dose crestor given LDL less than goal  Continue statin at discharge  Diabetes type II Uncontrolled  HgbA1c 7.3, goal < 7.0  Other Stroke Risk Factors  Advanced Age >/= 59   Cigarette smoker, advised to stop smoking  Obesity, Body mass index is 34.68 kg/m., BMI >/= 30 associated with increased stroke risk, recommend weight loss, diet and exercise as appropriate   Likely Obstructive sleep apnea, needs OP evaluation  Other Active Problems  AKI on CKD IIIb  Hospital day # 0 Patient presented with transient left upper extremity weakness and MRI shows embolic right frontal and occipital infarcts.  Strong suspicion of paroxysmal A. fib.  Check TEE and loop recorder.  Patient was subsequently found to have A. fib later in the  afternoon.  Recommend Eliquis for secondary stroke prevention discontinue aspirin and Plavix.  Aggressive risk factor modification.  Follow-up as an outpatient stroke clinic in 6 weeks.  Greater than 50% time during this 35-minute visit was spent on counseling and coordination of care about his embolic strokes and discussion about diagnosis of A. fib and answering questions.  Discussed with Dr. Jerral Ralph.  Stroke team will sign off.  Kindly call for questions.  To contact Stroke Continuity provider, please refer to [email or web address]. After hours, contact General Neurology

## 2020-07-01 NOTE — Progress Notes (Signed)
Pt c/o of "fast" heart rate; no other complaints; BP 141/104;  Pulse has been in the 116 -130's per CCMD.  MD notified; Stat EKG obtained. Med orders obtained.

## 2020-07-01 NOTE — Progress Notes (Signed)
SLP Cancellation Note  Patient Details Name: Roberto Rivera MRN: [id number] DOB: 07/18/1951   Cancelled treatment:       Reason Eval/Treat Not Completed: SLP screened, no needs identified, will sign off. Pt denied any acute changes in speech, language or cognition and no communication or cognitive-linguistic deficits were noted by OT. Formal evaluation not warranted at this time.   Roberto Rivera I. Roberto Clock, MS, CCC-SLP Acute Rehabilitation Services Office number [phone] Pager [phone]  Roberto Rivera 07/01/2020, 5:13 PM

## 2020-07-01 NOTE — Progress Notes (Signed)
Carotid artery duplex completed. Refer to "CV Proc" under chart review to view preliminary results.  07/01/2020 11:17 AM Eula Fried., MHA, RVT, RDCS, RDMS

## 2020-07-02 ENCOUNTER — Inpatient Hospital Stay (HOSPITAL_COMMUNITY): Payer: Medicare Other

## 2020-07-02 ENCOUNTER — Telehealth: Payer: Self-pay

## 2020-07-02 DIAGNOSIS — I639 Cerebral infarction, unspecified: Secondary | ICD-10-CM

## 2020-07-02 DIAGNOSIS — E1159 Type 2 diabetes mellitus with other circulatory complications: Secondary | ICD-10-CM

## 2020-07-02 DIAGNOSIS — I152 Hypertension secondary to endocrine disorders: Secondary | ICD-10-CM

## 2020-07-02 LAB — GLUCOSE, CAPILLARY
Glucose-Capillary: 115 mg/dL — ABNORMAL HIGH (ref 70–99)
Glucose-Capillary: 141 mg/dL — ABNORMAL HIGH (ref 70–99)

## 2020-07-02 SURGERY — ECHOCARDIOGRAM, TRANSESOPHAGEAL
Anesthesia: Monitor Anesthesia Care

## 2020-07-02 MED ORDER — METOPROLOL TARTRATE 50 MG PO TABS
25.0000 mg | ORAL_TABLET | Freq: Two times a day (BID) | ORAL | 1 refills | Status: DC
Start: 2020-07-02 — End: 2020-07-02

## 2020-07-02 MED ORDER — DILTIAZEM HCL ER COATED BEADS 120 MG PO CP24: 120.0000 mg | ORAL_CAPSULE | Freq: Every day | ORAL | 11 refills | Status: DC

## 2020-07-02 MED ORDER — APIXABAN 5 MG PO TABS
5.0000 mg | ORAL_TABLET | Freq: Two times a day (BID) | ORAL | 1 refills | Status: DC
Start: 2020-07-02 — End: 2020-08-26

## 2020-07-02 MED ORDER — METOPROLOL TARTRATE 50 MG PO TABS
50.0000 mg | ORAL_TABLET | Freq: Two times a day (BID) | ORAL | 1 refills | Status: DC
Start: 2020-07-02 — End: 2020-07-06

## 2020-07-02 NOTE — Progress Notes (Signed)
Patient d/c home with family.

## 2020-07-02 NOTE — Discharge Summary (Signed)
PATIENT DETAILS Name: Roberto Rivera Age: 69 y.o. Sex: male Date of Birth: 1951/04/22 MRN: [id number]. Admitting Physician: Roberto Rivera, Roberto Rivera LHT:DSKA, Roberto Rivera, Roberto Rivera  Admit Date: 06/30/2020 Discharge date: 07/02/2020  Recommendations for Outpatient Follow-up:  1. Follow up with PCP in 1-2 weeks 2. Please obtain CMP/CBC in one week 3. Please ensure follow-up with cardiology (Dr. Jacinto Rivera) and the stroke clinic.  Admitted From:  Home  Disposition: Home   Home Health: No  Equipment/Devices: None  Discharge Condition: Stable  CODE STATUS: FULL CODE  Diet recommendation:  Diet Order            Diet - low sodium heart healthy           Diet Carb Modified           Diet heart healthy/carb modified Room service appropriate? Yes; Fluid consistency: Thin  Diet effective now                  Brief Narrative: Patient is a 69 y.o. male with history of DM-2, HTN, HLD, CKD stage IIIb, tobacco use-who presented with left-sided weakness-further evaluation revealed acute CVA.  Significant events: 11/10>> admit for left-hand weakness-found to have acute CVA on MRI. 11/11>> A. fib with RVR  Significant studies: 11/10>> CT head: No acute intracranial abnormality. 11/10>> MRI brain: Acute infarct in the right precentral gyrus, small acute ischemia in the right occipital cortex. 11/10>> MRI brain: Severe stenosis vs short segment occlusion of distal M1 of the right MCA. 11/10>> A1c: 7.3 11/11>> LDL: 39 11/11>> carotid Doppler: No significant stenosis 11/11>> Echo: EF 60-65%, grade 1 diastolic dysfunction.  Antimicrobial therapy: None  Microbiology data: None  Procedures : None  Consults: Neurology, cardiology  Brief Hospital Course: Acute CVA: Felt to be embolic etiology-initially was in sinus rhythm-but on 11/11-developed A. fib with RVR for around 4 5 hours and then spontaneously converted back to sinus rhythm.  TEE/loop recorder was contemplated-however  since A. fib was discovered during this hospital stay-dispense has been canceled.  Per patient-he occasionally gets episodes of palpitations at home-this has been ongoing for the past several months.  He was initially on aspirin/Plavix-but since he has developed A. fib with RVR-after discussion with neurology-he has been switched to just Eliquis.  He presented with some mild left upper extremity weakness that has completely resolved.  Plans are to discharge home today on Eliquis-and have patient follow-up with neurology in the outpatient setting.   A. fib with RVR: Occurred on 11/11 for a few hours-started on beta-blocker and Cardizem-spontaneously converted back to sinus rhythm on 11/11 evening.  Given CHA2DS2-VASc of at least 4-he has been started on Eliquis.  He will follow with Dr. Jacinto Rivera as an outpatient.  AKI on CKD stage IIIb: AKI likely hemodynamically mediated-improved with supportive care.  Avoid nephrotoxic agents.  Follow electrolytes periodically.  HLD: On statin.  HTN: BP stable-initially permissive hypertension was allowed-antihypertensive agents will be resumed on discharge.  DM-2: CBG stable-continue SSI-resume oral hypoglycemic agents on discharge-follow with PCP for further optimization.  Obesity: Estimated body mass index is 34.68 kg/m as calculated from the following:   Height as of this encounter: 5\' 11"  (1.803 m).   Weight as of this encounter: 112.8 kg.    Discharge Diagnoses:  Principal Problem:   Acute CVA (cerebrovascular accident) [facility]) Active Problems:   Tobacco use   Hypertension associated with diabetes (HCC)   Diabetes (HCC)   Hyperlipidemia associated with type 2 diabetes mellitus (HCC)  Acute kidney injury superimposed on chronic kidney disease [facility])   Discharge Instructions:  Activity:  As tolerated   Discharge Instructions    Ambulatory referral to Neurology   Complete by: As directed    An appointment is requested in approximately: 4  weeks   Diet - low sodium heart healthy   Complete by: As directed    Diet Carb Modified   Complete by: As directed    Discharge instructions   Complete by: As directed    Follow with Primary Roberto Rivera  Roberto Rivera, Roberto Rivera in 1-2 weeks  Follow-up with cardiologist-Dr. Jacinto Rivera as instructed  Follow with the stroke clinic-they should be giving you a call with a follow-up appointment.  If you do not hear from them-please give them a call.  Please get a complete blood count and chemistry panel checked by your Primary Roberto Rivera at your next visit, and again as instructed by your Primary Roberto Rivera.  Get Medicines reviewed and adjusted: Please take all your medications with you for your next visit with your Primary Roberto Rivera  Laboratory/radiological data: Please request your Primary Roberto Rivera to go over all hospital tests and procedure/radiological results at the follow up, please ask your Primary Roberto Rivera to get all Hospital records sent to his/her office.  In some cases, they will be blood work, cultures and biopsy results pending at the time of your discharge. Please request that your primary care M.D. follows up on these results.  Also Note the following: If you experience worsening of your admission symptoms, develop shortness of breath, life threatening emergency, suicidal or homicidal thoughts you must seek medical attention immediately by calling 911 or calling your Roberto Rivera immediately  if symptoms less severe.  You must read complete instructions/literature along with all the possible adverse reactions/side effects for all the Medicines you take and that have been prescribed to you. Take any new Medicines after you have completely understood and accpet all the possible adverse reactions/side effects.   Do not drive when taking Pain medications or sleeping medications (Benzodaizepines)  Do not take more than prescribed Pain, Sleep and Anxiety Medications. It is not advisable to combine anxiety,sleep and pain medications without  talking with your primary care practitioner  Special Instructions: If you have smoked or chewed Tobacco  in the last 2 yrs please stop smoking, stop any regular Alcohol  and or any Recreational drug use.  Wear Seat belts while driving.  Please note: You were cared for by a hospitalist during your hospital stay. Once you are discharged, your primary care physician will handle any further medical issues. Please note that NO REFILLS for any discharge medications will be authorized once you are discharged, as it is imperative that you return to your primary care physician (or establish a relationship with a primary care physician if you do not have one) for your post hospital discharge needs so that they can reassess your need for medications and monitor your lab values.   Increase activity slowly   Complete by: As directed      Allergies as of 07/02/2020   No Known Allergies     Medication List    STOP taking these medications   amLODipine 10 MG tablet Commonly known as: NORVASC   Candesartan Cilexetil-HCTZ 32-25 MG Tabs   metoprolol succinate 50 MG 24 hr tablet Commonly known as: TOPROL-XL   potassium chloride 8 MEQ tablet Commonly known as: KLOR-CON   tizanidine 2 MG capsule Commonly known as: ZANAFLEX   Vitamin D (Ergocalciferol)  1.25 MG (50000 UNIT) Caps capsule Commonly known as: DRISDOL     TAKE these medications   Accu-Chek Aviva device Use to monitor glucose levels daily; E11.21   Accu-Chek FastClix Lancets Misc USE TO CHECK BLOOD SUGAR AS DIRECTED ONCE DAILY   Accu-Chek Guide test strip Generic drug: glucose blood USE AS DIRECTED TO TEST BLOOD SUGAR ONCE DAILY   apixaban 5 MG Tabs tablet Commonly known as: ELIQUIS Take 1 tablet (5 mg total) by mouth 2 (two) times daily.   diltiazem 120 MG 24 hr capsule Commonly known as: Cardizem CD Take 1 capsule (120 mg total) by mouth daily.   metFORMIN 500 MG 24 hr tablet Commonly known as: GLUCOPHAGE-XR TAKE 1  TABLET(500 MG) BY MOUTH DAILY WITH BREAKFAST What changed: See the new instructions.   metoprolol tartrate 50 MG tablet Commonly known as: LOPRESSOR Take 1 tablet (50 mg total) by mouth 2 (two) times daily.   repaglinide 1 MG tablet Commonly known as: PRANDIN Take 1 tablet (1 mg total) by mouth 2 (two) times daily before a meal.   rosuvastatin 5 MG tablet Commonly known as: CRESTOR 1 tab by mouth once daily What changed:   how much to take  how to take this  when to take this  additional instructions   tadalafil 20 MG tablet Commonly known as: CIALIS Take 20 mg by mouth daily as needed for erectile dysfunction.       Follow-up Information    Roberto Rivera, Roberto Rivera. Schedule an appointment as soon as possible for a visit in 1 week(s).   Specialties: Internal Medicine, Radiology Contact information: [address] [place] [address] [phone]        [facility]ates [facility], Roberto Rivera Follow up.   Specialty: Cardiology Why: office will call Contact information: [address] [place] [address] [phone]        [facility] Follow up.   Why: office will call Contact information: [address]     [address] [place] [address] [phone]             No Known Allergies     Other Procedures/Studies: CT HEAD WO CONTRAST  Result Date: 06/30/2020 CLINICAL DATA:  Transient ischemic attack. Intermittent weakness in left arm, radiating to back of neck and left ear for 1 week. EXAM: CT HEAD WITHOUT CONTRAST TECHNIQUE: Contiguous axial images were obtained from the base of the skull through the vertex without intravenous contrast. COMPARISON:  No pertinent prior exams are available for comparison. FINDINGS: Brain: Mild generalized cerebral atrophy. Moderate ill-defined hypoattenuation within the cerebral white matter is nonspecific, but compatible with chronic small vessel ischemic disease. There is no acute  intracranial hemorrhage. No demarcated cortical infarct. No extra-axial fluid collection. No evidence of intracranial mass. No midline shift. Vascular: No hyperdense vessel.  Atherosclerotic calcifications. Skull: Normal. Negative for fracture or focal lesion. Sinuses/Orbits: Visualized orbits show no acute finding. Moderate mucosal thickening within the inferior left maxillary sinus. Mild scattered paranasal sinus mucosal thickening elsewhere. IMPRESSION: No evidence of acute intracranial abnormality. Mild cerebral atrophy with moderate chronic small vessel ischemic disease. Paranasal mucosal thickening as described, most notably left maxillary. Electronically Signed   By: Jackey Loge DO   On: 06/30/2020 16:17   MR ANGIO HEAD WO CONTRAST  Result Date: 06/30/2020 CLINICAL DATA:  Stroke.  Right precentral gyrus infarct. EXAM: MRA HEAD WITHOUT CONTRAST TECHNIQUE: Angiographic images of the Circle of Willis were obtained using MRA technique without  intravenous contrast. COMPARISON:  Brain MRI 07/01/2019 FINDINGS: POSTERIOR CIRCULATION: --Vertebral arteries: Normal --Inferior cerebellar arteries: Normal. --Basilar artery: Normal. --Superior cerebellar arteries: Normal. --Posterior cerebral arteries: Normal. ANTERIOR CIRCULATION: --Intracranial internal carotid arteries: Normal. --Anterior cerebral arteries (ACA): Normal. --Middle cerebral arteries (MCA): There is severe stenosis or short segment occlusion of the distal M1 segment of the right MCA. The distal branches are patent. Left MCA is normal. ANATOMIC VARIANTS: None IMPRESSION: Severe stenosis versus short segment occlusion of the distal M1 segment of the right MCA. Electronically Signed   By: Deatra Robinson M.D.   On: 06/30/2020 22:43   MR BRAIN WO CONTRAST  Result Date: 06/30/2020 CLINICAL DATA:  Transient ischemia with intermittent left arm weakness. EXAM: MRI HEAD WITHOUT CONTRAST TECHNIQUE: Multiplanar, multiecho pulse sequences of the brain and  surrounding structures were obtained without intravenous contrast. COMPARISON:  Head CT 06/30/2020 FINDINGS: Brain: Small acute infarct of the base of the right precentral gyrus. Small area of abnormal diffusion restriction in the right occipital cortex. Multifocal hyperintense T2-weighted signal within the white matter. There is generalized atrophy without lobar predilection. No chronic microhemorrhage. Normal midline structures. Vascular: Normal flow voids. Skull and upper cervical spine: Normal marrow signal. Sinuses/Orbits: Left maxillary retention cyst.  Normal orbits. Other: None. IMPRESSION: 1. Small acute infarct of the base of the right precentral gyrus. 2. Small area of acute ischemia in the right occipital cortex. 3. Generalized atrophy and findings of chronic small vessel disease. Electronically Signed   By: Deatra Robinson M.D.   On: 06/30/2020 19:04   DG Chest Port 1 View  Result Date: 06/30/2020 CLINICAL DATA:  Left upper extremity weakness, hypertension, tobacco abuse EXAM: PORTABLE CHEST 1 VIEW COMPARISON:  None. FINDINGS: Two frontal views of the chest demonstrate an unremarkable cardiac silhouette. No airspace disease, effusion, or pneumothorax. Interstitial prominence likely due to tobacco abuse. IMPRESSION: 1. No acute intrathoracic process. Electronically Signed   By: Sharlet Salina M.D.   On: 06/30/2020 18:41   ECHOCARDIOGRAM COMPLETE  Result Date: 07/01/2020    ECHOCARDIOGRAM REPORT   Patient Name:   Roberto Rivera  Date of Exam: 07/01/2020 Medical Rec #:  [id number]  Height:       71.0 in Accession #:    [id number] Weight:       248.7 lb Date of Birth:  1951-02-08  BSA:          2.313 m Patient Age:    69 years   BP:           130/89 mmHg Patient Gender: M          HR:           75 bpm. Exam Location:  Inpatient Procedure: 2D Echo Indications:    Stroke I163.9  History:        Patient has prior history of Echocardiogram examinations, most                 recent 05/01/2017. Risk  Factors:Hypertension and Diabetes.  Sonographer:    Thurman Coyer RDCS (AE) Referring Phys: [id number] VISHAL R PATEL IMPRESSIONS  1. Left ventricular ejection fraction, by estimation, is 60 to 65%. The left ventricle has normal function. The left ventricle has no regional wall motion abnormalities. There is mild left ventricular hypertrophy. Left ventricular diastolic parameters are consistent with Grade I diastolic dysfunction (impaired relaxation).  2. Right ventricular systolic function is normal. The right ventricular size is normal. Tricuspid regurgitation signal is inadequate for assessing PA pressure.  3.  Left atrial size was severely dilated.  4. The mitral valve is normal in structure. No evidence of mitral valve regurgitation. No evidence of mitral stenosis. There was chordal systolic anterior motion noted (not valvular).  5. The aortic valve is tricuspid. Aortic valve regurgitation is mild. Mild aortic valve stenosis. Aortic valve mean gradient measures 14.0 mmHg.  6. Aortic dilatation noted. There is mild dilatation of the ascending aorta, measuring 40 mm.  7. The inferior vena cava is normal in size with greater than 50% respiratory variability, suggesting right atrial pressure of 3 mmHg. FINDINGS  Left Ventricle: Left ventricular ejection fraction, by estimation, is 60 to 65%. The left ventricle has normal function. The left ventricle has no regional wall motion abnormalities. The left ventricular internal cavity size was normal in size. There is  mild left ventricular hypertrophy. Left ventricular diastolic parameters are consistent with Grade I diastolic dysfunction (impaired relaxation). Right Ventricle: The right ventricular size is normal. No increase in right ventricular wall thickness. Right ventricular systolic function is normal. Tricuspid regurgitation signal is inadequate for assessing PA pressure. Left Atrium: Left atrial size was severely dilated. Right Atrium: Right atrial size was  normal in size. Pericardium: There is no evidence of pericardial effusion. Mitral Valve: The mitral valve is normal in structure. Mild mitral annular calcification. No evidence of mitral valve regurgitation. No evidence of mitral valve stenosis. Tricuspid Valve: The tricuspid valve is normal in structure. Tricuspid valve regurgitation is not demonstrated. Aortic Valve: The aortic valve is tricuspid. Aortic valve regurgitation is mild. Aortic regurgitation PHT measures 518 msec. Mild aortic stenosis is present. Aortic valve mean gradient measures 14.0 mmHg. Aortic valve peak gradient measures 21.5 mmHg. Aortic valve area, by VTI measures 3.16 cm. Pulmonic Valve: The pulmonic valve was normal in structure. Pulmonic valve regurgitation is not visualized. Aorta: The aortic root is normal in size and structure and aortic dilatation noted. There is mild dilatation of the ascending aorta, measuring 40 mm. Venous: The inferior vena cava is normal in size with greater than 50% respiratory variability, suggesting right atrial pressure of 3 mmHg. IAS/Shunts: No atrial level shunt detected by color flow Doppler.  LEFT VENTRICLE PLAX 2D LVIDd:         4.72 cm  Diastology LVIDs:         2.90 cm  LV e' medial:    3.98 cm/s LV PW:         1.46 cm  LV E/e' medial:  31.9 LV IVS:        1.48 cm  LV e' lateral:   4.73 cm/s LVOT diam:     2.30 cm  LV E/e' lateral: 26.8 LV SV:         142 LV SV Index:   61 LVOT Area:     4.15 cm  RIGHT VENTRICLE RV S prime:     16.30 cm/s TAPSE (M-mode): 1.8 cm LEFT ATRIUM            Index       RIGHT ATRIUM           Index LA diam:      4.00 cm  1.73 cm/m  RA Area:     17.70 cm LA Vol (A2C): 137.0 ml 59.23 ml/m RA Volume:   44.00 ml  19.02 ml/m  AORTIC VALVE AV Area (Vmax):    3.06 cm AV Area (Vmean):   3.31 cm AV Area (VTI):     3.16 cm AV Vmax:  232.00 cm/s AV Vmean:          164.667 cm/s AV VTI:            0.449 m AV Peak Grad:      21.5 mmHg AV Mean Grad:      14.0 mmHg LVOT Vmax:          171.00 cm/s LVOT Vmean:        131.000 cm/s LVOT VTI:          0.342 m LVOT/AV VTI ratio: 0.76 AI PHT:            518 msec  AORTA Ao Root diam: 3.50 cm MITRAL VALVE MV Area (PHT): 2.60 cm     SHUNTS MV Decel Time: 292 msec     Systemic VTI:  0.34 m MV E velocity: 127.00 cm/s  Systemic Diam: 2.30 cm MV A velocity: 122.00 cm/s MV E/A ratio:  1.04 Marca Anconaalton Mclean Roberto Rivera Electronically signed by Marca Anconaalton Mclean Roberto Rivera Signature Date/Time: 07/01/2020/3:10:50 PM    Final    [facility])  Result Date: 07/01/2020 Carotid Arterial Duplex Study Indications:       CVA. Risk Factors:      Hypertension, hyperlipidemia, Diabetes. Comparison Study:  No prior study Performing Technologist: Gertie FeyMichelle Simonetti MHA, RDMS, RVT, RDCS  Examination Guidelines: A complete evaluation includes B-mode imaging, spectral Doppler, color Doppler, and power Doppler as needed of all accessible portions of each vessel. Bilateral testing is considered an integral part of a complete examination. Limited examinations for reoccurring indications may be performed as noted.  Right Carotid Findings: +----------+--------+--------+--------+-----------------------+--------+           PSV cm/sEDV cm/sStenosisPlaque Description     Comments +----------+--------+--------+--------+-----------------------+--------+ CCA Prox  78      14                                              +----------+--------+--------+--------+-----------------------+--------+ CCA Distal40      11                                              +----------+--------+--------+--------+-----------------------+--------+ ICA Prox  17      7               smooth and heterogenous         +----------+--------+--------+--------+-----------------------+--------+ ICA Distal46      20                                              +----------+--------+--------+--------+-----------------------+--------+ ECA       59      13                                               +----------+--------+--------+--------+-----------------------+--------+ +----------+--------+-------+----------------+-------------------+           PSV cm/sEDV cmsDescribe        Arm Pressure (mmHG) +----------+--------+-------+----------------+-------------------+ [id number]             Multiphasic, WNL                    +----------+--------+-------+----------------+-------------------+ +---------+--------+--+--------+--+---------+  VertebralPSV cm/s42EDV cm/s12Antegrade +---------+--------+--+--------+--+---------+  Left Carotid Findings: +----------+--------+--------+--------+----------------------+--------+           PSV cm/sEDV cm/sStenosisPlaque Description    Comments +----------+--------+--------+--------+----------------------+--------+ CCA Prox  93      18                                             +----------+--------+--------+--------+----------------------+--------+ CCA Distal49      15                                             +----------+--------+--------+--------+----------------------+--------+ ICA Prox  18      7               focal and heterogenous         +----------+--------+--------+--------+----------------------+--------+ ICA Distal43      18                                             +----------+--------+--------+--------+----------------------+--------+ ECA       58      9                                              +----------+--------+--------+--------+----------------------+--------+ +----------+--------+--------+----------------+-------------------+           PSV cm/sEDV cm/sDescribe        Arm Pressure (mmHG) +----------+--------+--------+----------------+-------------------+ [id number]              Multiphasic, WNL                    +----------+--------+--------+----------------+-------------------+ +---------+--------+--+--------+-+---------+ VertebralPSV cm/s28EDV  cm/s8Antegrade +---------+--------+--+--------+-+---------+   Summary: Right Carotid: Velocities in the right ICA are consistent with a 1-39% stenosis. Left Carotid: Velocities in the left ICA are consistent with a 1-39% stenosis. Vertebrals:  Bilateral vertebral arteries demonstrate antegrade flow. Subclavians: Normal flow hemodynamics were seen in bilateral subclavian              arteries. *See table(s) above for measurements and observations.  Electronically signed by Delia Heady Roberto Rivera on 07/01/2020 at 12:54:09 PM.    Final      TODAY-DAY OF DISCHARGE:  Subjective:   Roberto Rivera today has no headache,no chest abdominal pain,no new weakness tingling or numbness, feels much better wants to go home today.   Objective:   Blood pressure (!) 152/85, pulse 70, temperature 98.1 F (36.7 C), temperature source Oral, resp. rate 18, height 5\' 11"  (1.803 m), weight 112.8 kg, SpO2 98 %. No intake or output data in the 24 hours ending 07/02/20 0940 Filed Weights   07/01/20 0231  Weight: 112.8 kg    Exam: Awake Alert, Oriented *3, No new F.N deficits, Normal affect [place].AT,PERRAL Supple Neck,No JVD, No cervical lymphadenopathy appriciated.  Symmetrical Chest wall movement, Good air movement bilaterally, CTAB RRR,No Gallops,Rubs or new Murmurs, No Parasternal Heave +ve B.Sounds, Abd Soft, Non tender, No organomegaly appriciated, No rebound -guarding or rigidity. No Cyanosis, Clubbing or edema, No new Rash or bruise   PERTINENT RADIOLOGIC STUDIES: CT HEAD WO CONTRAST  Result Date:  06/30/2020 CLINICAL DATA:  Transient ischemic attack. Intermittent weakness in left arm, radiating to back of neck and left ear for 1 week. EXAM: CT HEAD WITHOUT CONTRAST TECHNIQUE: Contiguous axial images were obtained from the base of the skull through the vertex without intravenous contrast. COMPARISON:  No pertinent prior exams are available for comparison. FINDINGS: Brain: Mild generalized cerebral atrophy. Moderate  ill-defined hypoattenuation within the cerebral white matter is nonspecific, but compatible with chronic small vessel ischemic disease. There is no acute intracranial hemorrhage. No demarcated cortical infarct. No extra-axial fluid collection. No evidence of intracranial mass. No midline shift. Vascular: No hyperdense vessel.  Atherosclerotic calcifications. Skull: Normal. Negative for fracture or focal lesion. Sinuses/Orbits: Visualized orbits show no acute finding. Moderate mucosal thickening within the inferior left maxillary sinus. Mild scattered paranasal sinus mucosal thickening elsewhere. IMPRESSION: No evidence of acute intracranial abnormality. Mild cerebral atrophy with moderate chronic small vessel ischemic disease. Paranasal mucosal thickening as described, most notably left maxillary. Electronically Signed   By: Jackey Loge DO   On: 06/30/2020 16:17   MR ANGIO HEAD WO CONTRAST  Result Date: 06/30/2020 CLINICAL DATA:  Stroke.  Right precentral gyrus infarct. EXAM: MRA HEAD WITHOUT CONTRAST TECHNIQUE: Angiographic images of the Circle of Willis were obtained using MRA technique without intravenous contrast. COMPARISON:  Brain MRI 07/01/2019 FINDINGS: POSTERIOR CIRCULATION: --Vertebral arteries: Normal --Inferior cerebellar arteries: Normal. --Basilar artery: Normal. --Superior cerebellar arteries: Normal. --Posterior cerebral arteries: Normal. ANTERIOR CIRCULATION: --Intracranial internal carotid arteries: Normal. --Anterior cerebral arteries (ACA): Normal. --Middle cerebral arteries (MCA): There is severe stenosis or short segment occlusion of the distal M1 segment of the right MCA. The distal branches are patent. Left MCA is normal. ANATOMIC VARIANTS: None IMPRESSION: Severe stenosis versus short segment occlusion of the distal M1 segment of the right MCA. Electronically Signed   By: Deatra Robinson M.D.   On: 06/30/2020 22:43   MR BRAIN WO CONTRAST  Result Date: 06/30/2020 CLINICAL DATA:   Transient ischemia with intermittent left arm weakness. EXAM: MRI HEAD WITHOUT CONTRAST TECHNIQUE: Multiplanar, multiecho pulse sequences of the brain and surrounding structures were obtained without intravenous contrast. COMPARISON:  Head CT 06/30/2020 FINDINGS: Brain: Small acute infarct of the base of the right precentral gyrus. Small area of abnormal diffusion restriction in the right occipital cortex. Multifocal hyperintense T2-weighted signal within the white matter. There is generalized atrophy without lobar predilection. No chronic microhemorrhage. Normal midline structures. Vascular: Normal flow voids. Skull and upper cervical spine: Normal marrow signal. Sinuses/Orbits: Left maxillary retention cyst.  Normal orbits. Other: None. IMPRESSION: 1. Small acute infarct of the base of the right precentral gyrus. 2. Small area of acute ischemia in the right occipital cortex. 3. Generalized atrophy and findings of chronic small vessel disease. Electronically Signed   By: Deatra Robinson M.D.   On: 06/30/2020 19:04   DG Chest Port 1 View  Result Date: 06/30/2020 CLINICAL DATA:  Left upper extremity weakness, hypertension, tobacco abuse EXAM: PORTABLE CHEST 1 VIEW COMPARISON:  None. FINDINGS: Two frontal views of the chest demonstrate an unremarkable cardiac silhouette. No airspace disease, effusion, or pneumothorax. Interstitial prominence likely due to tobacco abuse. IMPRESSION: 1. No acute intrathoracic process. Electronically Signed   By: Sharlet Salina M.D.   On: 06/30/2020 18:41   ECHOCARDIOGRAM COMPLETE  Result Date: 07/01/2020    ECHOCARDIOGRAM REPORT   Patient Name:   Roberto Rivera  Date of Exam: 07/01/2020 Medical Rec #:  [id number]  Height:       71.0 in  Accession #:    [id number] Weight:       248.7 lb Date of Birth:  1951/03/26  BSA:          2.313 m Patient Age:    69 years   BP:           130/89 mmHg Patient Gender: M          HR:           75 bpm. Exam Location:  Inpatient Procedure: 2D Echo  Indications:    Stroke I163.9  History:        Patient has prior history of Echocardiogram examinations, most                 recent 05/01/2017. Risk Factors:Hypertension and Diabetes.  Sonographer:    Thurman Coyer RDCS (AE) Referring Phys: [id number] VISHAL R PATEL IMPRESSIONS  1. Left ventricular ejection fraction, by estimation, is 60 to 65%. The left ventricle has normal function. The left ventricle has no regional wall motion abnormalities. There is mild left ventricular hypertrophy. Left ventricular diastolic parameters are consistent with Grade I diastolic dysfunction (impaired relaxation).  2. Right ventricular systolic function is normal. The right ventricular size is normal. Tricuspid regurgitation signal is inadequate for assessing PA pressure.  3. Left atrial size was severely dilated.  4. The mitral valve is normal in structure. No evidence of mitral valve regurgitation. No evidence of mitral stenosis. There was chordal systolic anterior motion noted (not valvular).  5. The aortic valve is tricuspid. Aortic valve regurgitation is mild. Mild aortic valve stenosis. Aortic valve mean gradient measures 14.0 mmHg.  6. Aortic dilatation noted. There is mild dilatation of the ascending aorta, measuring 40 mm.  7. The inferior vena cava is normal in size with greater than 50% respiratory variability, suggesting right atrial pressure of 3 mmHg. FINDINGS  Left Ventricle: Left ventricular ejection fraction, by estimation, is 60 to 65%. The left ventricle has normal function. The left ventricle has no regional wall motion abnormalities. The left ventricular internal cavity size was normal in size. There is  mild left ventricular hypertrophy. Left ventricular diastolic parameters are consistent with Grade I diastolic dysfunction (impaired relaxation). Right Ventricle: The right ventricular size is normal. No increase in right ventricular wall thickness. Right ventricular systolic function is normal. Tricuspid  regurgitation signal is inadequate for assessing PA pressure. Left Atrium: Left atrial size was severely dilated. Right Atrium: Right atrial size was normal in size. Pericardium: There is no evidence of pericardial effusion. Mitral Valve: The mitral valve is normal in structure. Mild mitral annular calcification. No evidence of mitral valve regurgitation. No evidence of mitral valve stenosis. Tricuspid Valve: The tricuspid valve is normal in structure. Tricuspid valve regurgitation is not demonstrated. Aortic Valve: The aortic valve is tricuspid. Aortic valve regurgitation is mild. Aortic regurgitation PHT measures 518 msec. Mild aortic stenosis is present. Aortic valve mean gradient measures 14.0 mmHg. Aortic valve peak gradient measures 21.5 mmHg. Aortic valve area, by VTI measures 3.16 cm. Pulmonic Valve: The pulmonic valve was normal in structure. Pulmonic valve regurgitation is not visualized. Aorta: The aortic root is normal in size and structure and aortic dilatation noted. There is mild dilatation of the ascending aorta, measuring 40 mm. Venous: The inferior vena cava is normal in size with greater than 50% respiratory variability, suggesting right atrial pressure of 3 mmHg. IAS/Shunts: No atrial level shunt detected by color flow Doppler.  LEFT VENTRICLE PLAX 2D LVIDd:  4.72 cm  Diastology LVIDs:         2.90 cm  LV e' medial:    3.98 cm/s LV PW:         1.46 cm  LV E/e' medial:  31.9 LV IVS:        1.48 cm  LV e' lateral:   4.73 cm/s LVOT diam:     2.30 cm  LV E/e' lateral: 26.8 LV SV:         142 LV SV Index:   61 LVOT Area:     4.15 cm  RIGHT VENTRICLE RV S prime:     16.30 cm/s TAPSE (M-mode): 1.8 cm LEFT ATRIUM            Index       RIGHT ATRIUM           Index LA diam:      4.00 cm  1.73 cm/m  RA Area:     17.70 cm LA Vol (A2C): 137.0 ml 59.23 ml/m RA Volume:   44.00 ml  19.02 ml/m  AORTIC VALVE AV Area (Vmax):    3.06 cm AV Area (Vmean):   3.31 cm AV Area (VTI):     3.16 cm AV Vmax:            232.00 cm/s AV Vmean:          164.667 cm/s AV VTI:            0.449 m AV Peak Grad:      21.5 mmHg AV Mean Grad:      14.0 mmHg LVOT Vmax:         171.00 cm/s LVOT Vmean:        131.000 cm/s LVOT VTI:          0.342 m LVOT/AV VTI ratio: 0.76 AI PHT:            518 msec  AORTA Ao Root diam: 3.50 cm MITRAL VALVE MV Area (PHT): 2.60 cm     SHUNTS MV Decel Time: 292 msec     Systemic VTI:  0.34 m MV E velocity: 127.00 cm/s  Systemic Diam: 2.30 cm MV A velocity: 122.00 cm/s MV E/A ratio:  1.04 Marca Ancona Roberto Rivera Electronically signed by Marca Ancona Roberto Rivera Signature Date/Time: 07/01/2020/3:10:50 PM    Final    VAS US CAROTID (at [facility] and [facility] only)  Result Date: 07/01/2020 Carotid Arterial Duplex Study Indications:       CVA. Risk Factors:      Hypertension, hyperlipidemia, Diabetes. Comparison Study:  No prior study Performing Technologist: Gertie Fey MHA, RDMS, RVT, RDCS  Examination Guidelines: A complete evaluation includes B-mode imaging, spectral Doppler, color Doppler, and power Doppler as needed of all accessible portions of each vessel. Bilateral testing is considered an integral part of a complete examination. Limited examinations for reoccurring indications may be performed as noted.  Right Carotid Findings: +----------+--------+--------+--------+-----------------------+--------+           PSV cm/sEDV cm/sStenosisPlaque Description     Comments +----------+--------+--------+--------+-----------------------+--------+ CCA Prox  78      14                                              +----------+--------+--------+--------+-----------------------+--------+ CCA Distal40      11                                              +----------+--------+--------+--------+-----------------------+--------+  ICA Prox  17      7               smooth and heterogenous         +----------+--------+--------+--------+-----------------------+--------+ ICA Distal46      20                                               +----------+--------+--------+--------+-----------------------+--------+ ECA       59      13                                              +----------+--------+--------+--------+-----------------------+--------+ +----------+--------+-------+----------------+-------------------+           PSV cm/sEDV cmsDescribe        Arm Pressure (mmHG) +----------+--------+-------+----------------+-------------------+ [id number]             Multiphasic, WNL                    +----------+--------+-------+----------------+-------------------+ +---------+--------+--+--------+--+---------+ VertebralPSV cm/s42EDV cm/s12Antegrade +---------+--------+--+--------+--+---------+  Left Carotid Findings: +----------+--------+--------+--------+----------------------+--------+           PSV cm/sEDV cm/sStenosisPlaque Description    Comments +----------+--------+--------+--------+----------------------+--------+ CCA Prox  93      18                                             +----------+--------+--------+--------+----------------------+--------+ CCA Distal49      15                                             +----------+--------+--------+--------+----------------------+--------+ ICA Prox  18      7               focal and heterogenous         +----------+--------+--------+--------+----------------------+--------+ ICA Distal43      18                                             +----------+--------+--------+--------+----------------------+--------+ ECA       58      9                                              +----------+--------+--------+--------+----------------------+--------+ +----------+--------+--------+----------------+-------------------+           PSV cm/sEDV cm/sDescribe        Arm Pressure (mmHG) +----------+--------+--------+----------------+-------------------+ [id number]              Multiphasic, WNL                     +----------+--------+--------+----------------+-------------------+ +---------+--------+--+--------+-+---------+ VertebralPSV cm/s28EDV cm/s8Antegrade +---------+--------+--+--------+-+---------+   Summary: Right Carotid: Velocities in the right ICA are consistent with a 1-39% stenosis. Left Carotid: Velocities in the left ICA are consistent with a 1-39% stenosis. Vertebrals:  Bilateral vertebral arteries demonstrate antegrade  flow. Subclavians: Normal flow hemodynamics were seen in bilateral subclavian              arteries. *See table(s) above for measurements and observations.  Electronically signed by Delia Heady Roberto Rivera on 07/01/2020 at 12:54:09 PM.    Final      PERTINENT LAB RESULTS: CBC: Recent Labs    06/30/20 1456 06/30/20 1456 06/30/20 1527 07/01/20 0314  WBC 11.1*  --   --  9.2  HGB 15.8   < > 17.3* 13.8  HCT 49.5   < > 51.0 42.9  PLT 239  --   --  209   < > = values in this interval not displayed.   CMET CMP     Component Value Date/Time   NA 138 07/01/2020 0314   K 3.1 (L) 07/01/2020 0314   CL 104 07/01/2020 0314   CO2 25 07/01/2020 0314   GLUCOSE 121 (H) 07/01/2020 0314   BUN 16 07/01/2020 0314   CREATININE 1.52 (H) 07/01/2020 0314   CALCIUM 8.9 07/01/2020 0314   PROT 7.3 06/30/2020 1456   ALBUMIN 3.7 06/30/2020 1456   AST 17 06/30/2020 1456   ALT 14 06/30/2020 1456   ALKPHOS 82 06/30/2020 1456   BILITOT 0.8 06/30/2020 1456   GFRNONAA 50 (L) 07/01/2020 0314   GFRAA 74 05/31/2007 1717    GFR Estimated Creatinine Clearance: 59.4 mL/min (A) (by C-G formula based on SCr of 1.52 mg/dL (H)). No results for input(s): LIPASE, AMYLASE in the last 72 hours. No results for input(s): CKTOTAL, CKMB, CKMBINDEX, TROPONINI in the last 72 hours. Invalid input(s): POCBNP No results for input(s): DDIMER in the last 72 hours. Recent Labs    06/30/20 2222  HGBA1C 7.3*   Recent Labs    07/01/20 0314  CHOL 79  HDL 26*  LDLCALC 39  TRIG 72  CHOLHDL 3.0   No  results for input(s): TSH, T4TOTAL, T3FREE, THYROIDAB in the last 72 hours.  Invalid input(s): FREET3 No results for input(s): VITAMINB12, FOLATE, FERRITIN, TIBC, IRON, RETICCTPCT in the last 72 hours. Coags: Recent Labs    06/30/20 1619  INR 1.0   Microbiology: Recent Results (from the past 240 hour(s))  Respiratory Panel by RT PCR (Flu A&B, Covid) - Nasopharyngeal Swab     Status: None   Collection Time: 06/30/20  7:46 PM   Specimen: Nasopharyngeal Swab  Result Value Ref Range Status   SARS Coronavirus 2 by RT PCR NEGATIVE NEGATIVE Final    Comment: (NOTE) SARS-CoV-2 target nucleic acids are NOT DETECTED.  The SARS-CoV-2 RNA is generally detectable in upper respiratoy specimens during the acute phase of infection. The lowest concentration of SARS-CoV-2 viral copies this assay can detect is 131 copies/mL. A negative result does not preclude SARS-Cov-2 infection and should not be used as the sole basis for treatment or other patient management decisions. A negative result may occur with  improper specimen collection/handling, submission of specimen other than nasopharyngeal swab, presence of viral mutation(s) within the areas targeted by this assay, and inadequate number of viral copies (<131 copies/mL). A negative result must be combined with clinical observations, patient history, and epidemiological information. The expected result is Negative.  Fact Sheet for Patients:  https://www.moore.com/  Fact Sheet for Healthcare Providers:  https://www.young.biz/  This test is no t yet approved or cleared by the [place] FDA and  has been authorized for detection and/or diagnosis of SARS-CoV-2 by FDA under an Emergency Use Authorization (EUA). This EUA will remain  in effect (meaning this test can be used) for the duration of the COVID-19 declaration under Section 564(b)(1) of the Act, 21 U.S.C. section 360bbb-3(b)(1), unless the  authorization is terminated or revoked sooner.     Influenza A by PCR NEGATIVE NEGATIVE Final   Influenza B by PCR NEGATIVE NEGATIVE Final    Comment: (NOTE) The Xpert Xpress SARS-CoV-2/FLU/RSV assay is intended as an aid in  the diagnosis of influenza from Nasopharyngeal swab specimens and  should not be used as a sole basis for treatment. Nasal washings and  aspirates are unacceptable for Xpert Xpress SARS-CoV-2/FLU/RSV  testing.  Fact Sheet for Patients: https://www.moore.com/  Fact Sheet for Healthcare Providers: https://www.young.biz/  This test is not yet approved or cleared by the [place] FDA and  has been authorized for detection and/or diagnosis of SARS-CoV-2 by  FDA under an Emergency Use Authorization (EUA). This EUA will remain  in effect (meaning this test can be used) for the duration of the  Covid-19 declaration under Section 564(b)(1) of the Act, 21  U.S.C. section 360bbb-3(b)(1), unless the authorization is  terminated or revoked. Performed at [facility], [address], [place], [place] [address]     FURTHER DISCHARGE INSTRUCTIONS:  Get Medicines reviewed and adjusted: Please take all your medications with you for your next visit with your Primary Roberto Rivera  Laboratory/radiological data: Please request your Primary Roberto Rivera to go over all hospital tests and procedure/radiological results at the follow up, please ask your Primary Roberto Rivera to get all Hospital records sent to his/her office.  In some cases, they will be blood work, cultures and biopsy results pending at the time of your discharge. Please request that your primary care M.D. goes through all the records of your hospital data and follows up on these results.  Also Note the following: If you experience worsening of your admission symptoms, develop shortness of breath, life threatening emergency, suicidal or homicidal thoughts you must seek medical attention  immediately by calling 911 or calling your Roberto Rivera immediately  if symptoms less severe.  You must read complete instructions/literature along with all the possible adverse reactions/side effects for all the Medicines you take and that have been prescribed to you. Take any new Medicines after you have completely understood and accpet all the possible adverse reactions/side effects.   Do not drive when taking Pain medications or sleeping medications (Benzodaizepines)  Do not take more than prescribed Pain, Sleep and Anxiety Medications. It is not advisable to combine anxiety,sleep and pain medications without talking with your primary care practitioner  Special Instructions: If you have smoked or chewed Tobacco  in the last 2 yrs please stop smoking, stop any regular Alcohol  and or any Recreational drug use.  Wear Seat belts while driving.  Please note: You were cared for by a hospitalist during your hospital stay. Once you are discharged, your primary care physician will handle any further medical issues. Please note that NO REFILLS for any discharge medications will be authorized once you are discharged, as it is imperative that you return to your primary care physician (or establish a relationship with a primary care physician if you do not have one) for your post hospital discharge needs so that they can reassess your need for medications and monitor your lab values.  Total Time spent coordinating discharge including counseling, education and face to face time equals 35 minutes.  SignedJeoffrey Massed 07/02/2020 9:40 AM

## 2020-07-02 NOTE — Telephone Encounter (Signed)
Pt called to inform us that he was prescribed Eliquis at the hospital. Pt mention it is too expensive for him and would like to know if you could prescribe him another blood thinner medication. Thank advise thank you

## 2020-07-02 NOTE — TOC Transition Note (Signed)
Transition of Care [facility]) - CM/SW Discharge Note   Patient Details  Name: Roberto Rivera MRN: [id number] Date of Birth: Jul 11, 1951  [facility] of [facility]) CM/SW Contact:  Kermit Balo, RN Phone Number: 07/02/2020, 10:30 AM   Clinical Narrative:    Pt is discharging home with self care. No f/u per PT/OT and no DME needs. CM provided him 30 day free Eliquis card.  Pt has transportation home.   Final next level of care: Home/Self Care Barriers to Discharge: No Barriers Identified   Patient Goals and CMS Choice        Discharge Placement                       Discharge Plan and Services                                     Social Determinants of Health (SDOH) Interventions     Readmission Risk Interventions No flowsheet data found.

## 2020-07-02 NOTE — Progress Notes (Signed)
Bilateral lower extremity venous duplex completed. Refer to "CV Proc" under chart review to view preliminary results.  07/02/2020 11:34 AM Eula Fried., MHA, RVT, RDCS, RDMS

## 2020-07-02 NOTE — Plan of Care (Signed)
Adequate for discharge.

## 2020-07-02 NOTE — Telephone Encounter (Signed)
He can finish one month supply, then we can switch to Warfarin when he comes here, but needs regular INR monitoring. Best to be on Eliquis

## 2020-07-02 NOTE — Consult Note (Signed)
CARDIOLOGY CONSULT NOTE  Patient ID: Roberto Rivera MRN: [id number] DOB/AGE: 11/19/1950 69 y.o.  Admit date: 06/30/2020 Referring Physician: Jeoffrey Massed, MD Primary Physician:  Corwin Levins, MD Reason for Consultation  Atrial fibrillation with RVR  Patient ID: Roberto Rivera, male    DOB: 09-12-1950, 69 y.o.   MRN: [id number]  Chief Complaint  Patient presents with  . Weakness  . Tingling   HPI:    Roberto Rivera  is a 69 y.o. AA male with history of type 2 diabetes, stage III chronic kidney disease, hypertension, hyperlipidemia, and tobacco use (smokes pack/day).  He presented to the emergency department 06/30/2020 with complaint of left hand numbness and weakness.  He reports that over the last 2-3 weeks he has noted occasional paresthesias around his left ear and down his left arm.  He also reports occasional episodes of palpitations lasting several minutes.  On 06/30/2020 he noted left arm weakness which prompted him to seek evaluation in the emergency department.  Upon evaluation in the emergency department MRI revealed small acute infarct at the base of the right precentral gyrus and right occipital cortex.   Echocardiogram showed normal ejection fraction with severe dilatation of left atrium and mild dilation of ascending aorta.  Carotid ultrasound showed no significant extracranial stenosis.  Cardiac monitoring 07/01/2020 revealed patient in atrial fibrillation with RVR. Cardiology was consulted for atrial fibrillation with RVR management.  He was started on Lopressor 25 mg 3 times daily as well as diltiazem and Eliquis.   Patient is presently doing well with resolution of left arm weakness and no recurrence of palpitations.   Past Medical History:  Diagnosis Date  . ALLERGIC RHINITIS 04/01/2007  . Diabetes mellitus   . DIABETES MELLITUS, TYPE II 04/01/2007  . 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 Hypercholesteremia   . HYPERCHOLESTEROLEMIA 02/07/2008  . Hypertension   . HYPERTENSION  04/01/2007  . MICROCYTOSIS 02/07/2008  . OSTEOARTHRITIS, SPINE 02/07/2008  . THYROID CYST 02/07/2008   History reviewed. No pertinent surgical history. Social History   Tobacco Use  . Smoking status: Current Every Day Smoker    Packs/day: 1.00    Types: Cigarettes  . Smokeless tobacco: Never Used  Substance Use Topics  . Alcohol use: No    Alcohol/week: 0.0 standard drinks    Marital Sttus: Married  ROS  Review of Systems  Constitutional: Negative for malaise/fatigue and weight gain.  HENT: Negative for ear pain and hearing loss.   Eyes: Negative for blurred vision and visual disturbance.  Cardiovascular: Negative for chest pain, claudication, leg swelling, near-syncope, orthopnea, palpitations, paroxysmal nocturnal dyspnea and syncope.  Respiratory: Negative for hemoptysis and shortness of breath.   Endocrine: Negative.   Hematologic/Lymphatic: Negative for bleeding problem. Does not bruise/bleed easily.  Skin: Negative.   Musculoskeletal: Negative.   Gastrointestinal: Negative for change in bowel habit, hematemesis, hematochezia and melena.  Genitourinary: Negative for hematuria.  Neurological: Negative for disturbances in coordination, dizziness, light-headedness, numbness, paresthesias, sensory change and weakness.  Psychiatric/Behavioral: Negative for altered mental status. The patient is not nervous/anxious.   Allergic/Immunologic: Negative.    Objective   Vitals with BMI 07/02/2020 07/02/2020 07/01/2020  Height - - -  Weight - - -  BMI - - -  Systolic 102 132 846  Diastolic 78 81 87  Pulse 62 65 77    Blood pressure 102/78, pulse 62, temperature 98.6 F (37 C), temperature source Oral, resp. rate 19, height  (1.803 m), weight 112.8 kg, SpO2 99 %.  Physical Exam Vitals reviewed.  Constitutional:      Appearance: He is obese.  HENT:     Head: Normocephalic and atraumatic.     Right Ear: External ear normal.     Left Ear: External ear normal.     Nose:  Nose normal.     Mouth/Throat:     Mouth: Mucous membranes are moist.  Eyes:     Extraocular Movements: Extraocular movements intact.     Conjunctiva/sclera: Conjunctivae normal.  Cardiovascular:     Rate and Rhythm: Normal rate and regular rhythm.     Pulses: Intact distal pulses.          Carotid pulses are 2+ on the right side and 2+ on the left side.      Radial pulses are 2+ on the right side and 2+ on the left side.       Popliteal pulses are 2+ on the right side and 2+ on the left side.       Dorsalis pedis pulses are 1+ on the right side and 2+ on the left side.       Posterior tibial pulses are 2+ on the right side and 2+ on the left side.     Heart sounds: S1 normal and S2 normal. No murmur heard.  No gallop.      Comments: No JVD.  Pulmonary:     Effort: Pulmonary effort is normal. No respiratory distress.     Breath sounds: No wheezing, rhonchi or rales.  Abdominal:     General: Bowel sounds are normal.     Palpations: Abdomen is soft.  Musculoskeletal:        General: Normal range of motion.     Cervical back: Normal range of motion.     Right lower leg: No edema.     Left lower leg: No edema.  Skin:    General: Skin is warm and dry.     Capillary Refill: Capillary refill takes less than 2 seconds.  Neurological:     General: No focal deficit present.     Mental Status: He is alert and oriented to person, place, and time.  Psychiatric:        Mood and Affect: Mood normal.        Behavior: Behavior normal.    Laboratory examination:   Recent Labs    01/28/20 0956 01/28/20 0956 06/30/20 1456 06/30/20 1527 07/01/20 0314  NA 138   < > 135 139 138  K 3.7   < > 3.5 3.5 3.1*  CL 102   < > 98 99 104  CO2 30  --  24  --  25  GLUCOSE 175*   < > 150* 144* 121*  BUN 22   < > 20 22 16   CREATININE 1.40   < > 1.74* 1.70* 1.52*  CALCIUM 9.5  --  9.6  --  8.9  GFRNONAA  --   --  42*  --  50*   < > = values in this interval not displayed.   estimated creatinine  clearance is 59.4 mL/min (A) (by C-G formula based on SCr of 1.52 mg/dL (H)).  CMP Latest Ref Rng & Units 07/01/2020 06/30/2020 06/30/2020  Glucose 70 - 99 mg/dL 13/05/2020) 034(V) 425(Z)  BUN 8 - 23 mg/dL 16 22 20   Creatinine 0.61 - 1.24 mg/dL 563(O) ) 7.56(E)  Sodium 135 - 145 mmol/L 138 139 135  Potassium 3.5 - 5.1 mmol/L 3.1(L) 3.5 3.5  Chloride  98 - 111 mmol/L 104 99 98  CO2 22 - 32 mmol/L 25 - 24  Calcium 8.9 - 10.3 mg/dL 8.9 - 9.6  Total Protein 6.5 - 8.1 g/dL - - 7.3  Total Bilirubin 0.3 - 1.2 mg/dL - - 0.8  Alkaline Phos 38 - 126 U/L - - 82  AST 15 - 41 U/L - - 17  ALT 0 - 44 U/L - - 14   CBC Latest Ref Rng & Units 07/01/2020 06/30/2020 06/30/2020  WBC 4.0 - 10.5 K/uL 9.2 - 11.1(H)  Hemoglobin 13.0 - 17.0 g/dL 16.1 17.3(H) 15.8  Hematocrit 39 - 52 % 42.9 51.0 49.5  Platelets 150 - 400 K/uL 209 - 239   Lipid Panel Recent Labs    01/28/20 0956 07/01/20 0314  CHOL 95 79  TRIG 65.0 72  LDLCALC 51 39  VLDL 13.0 14  HDL 31.40* 26*  CHOLHDL 3 3.0    HEMOGLOBIN A1C Lab Results  Component Value Date   HGBA1C 7.3 (H) 06/30/2020   MPG 162.81 06/30/2020   TSH Recent Labs    01/28/20 0956  TSH 1.64   BNP (last 3 results) No results for input(s): BNP in the last 8760 hours. Results for orders placed or performed during the hospital encounter of 06/30/20 (from the past 48 hour(s))  CBC     Status: Abnormal   Collection Time: 06/30/20  2:56 PM  Result Value Ref Range   WBC 11.1 (H) 4.0 - 10.5 K/uL   RBC 6.21 (H) 4.22 - 5.81 MIL/uL   Hemoglobin 15.8 13.0 - 17.0 g/dL   HCT 09.6 39 - 52 %   MCV 79.7 (L) 80.0 - 100.0 fL   MCH 25.4 (L) 26.0 - 34.0 pg   MCHC 31.9 30.0 - 36.0 g/dL   RDW 04.5 (H) 40.9 - 81.1 %   Platelets 239 150 - 400 K/uL   nRBC 0.0 0.0 - 0.2 %    Comment: Performed at [facility], [address], [place], [place] [address]  Differential     Status: Abnormal   Collection Time: 06/30/20  2:56 PM  Result Value Ref Range   Neutrophils  Relative % 55 %   Neutro Abs 6.1 1.7 - 7.7 K/uL   Lymphocytes Relative 37 %   Lymphs Abs 4.1 (H) 0.7 - 4.0 K/uL   Monocytes Relative 7 %   Monocytes Absolute 0.8 0.1 - 1.0 K/uL   Eosinophils Relative 1 %   Eosinophils Absolute 0.1 0.0 - 0.5 K/uL   Basophils Relative 0 %   Basophils Absolute 0.1 0.0 - 0.1 K/uL   Immature Granulocytes 0 %   Abs Immature Granulocytes 0.03 0.00 - 0.07 K/uL    Comment: Performed at [facility], [address] [place], [place] [address]  Comprehensive metabolic panel     Status: Abnormal   Collection Time: 06/30/20  2:56 PM  Result Value Ref Range   Sodium 135 135 - 145 mmol/L   Potassium 3.5 3.5 - 5.1 mmol/L   Chloride 98 98 - 111 mmol/L   CO2 24 22 - 32 mmol/L   Glucose, Bld 150 (H) 70 - 99 mg/dL    Comment: Glucose reference range applies only to samples taken after fasting for at least 8 hours.   BUN 20 8 - 23 mg/dL   Creatinine, Ser 1.30 (H) 0.61 - 1.24 mg/dL   Calcium 9.6 8.9 - 86.5 mg/dL   Total Protein 7.3 6.5 - 8.1 g/dL  Albumin 3.7 3.5 - 5.0 g/dL   AST 17 15 - 41 U/L   ALT 14 0 - 44 U/L   Alkaline Phosphatase 82 38 - 126 U/L   Total Bilirubin 0.8 0.3 - 1.2 mg/dL   GFR, Estimated 42 (L) >60 mL/min    Comment: (NOTE) Calculated using the CKD-EPI Creatinine Equation (2021)    Anion gap 13 5 - 15    Comment: Performed at [facility], [address] [place], [place] [address]  I-stat chem 8, ED     Status: Abnormal   Collection Time: 06/30/20  3:27 PM  Result Value Ref Range   Sodium 139 135 - 145 mmol/L   Potassium 3.5 3.5 - 5.1 mmol/L   Chloride 99 98 - 111 mmol/L   BUN 22 8 - 23 mg/dL   Creatinine, Ser 6.04 (H) 0.61 - 1.24 mg/dL   Glucose, Bld 540 (H) 70 - 99 mg/dL    Comment: Glucose reference range applies only to samples taken after fasting for at least 8 hours.   Calcium, Ion 1.22 1.15 - 1.40 mmol/L   TCO2 25 22 - 32 mmol/L   Hemoglobin 17.3 (H) 13.0 - 17.0 g/dL   HCT 98.1 39 - 52 %  Protime-INR     Status:  None   Collection Time: 06/30/20  4:19 PM  Result Value Ref Range   Prothrombin Time 13.0 11.4 - 15.2 seconds   INR 1.0 0.8 - 1.2    Comment: (NOTE) INR goal varies based on device and disease states. Performed at [facility], [address] [place], [place] [address]   APTT     Status: None   Collection Time: 06/30/20  4:19 PM  Result Value Ref Range   aPTT 31 24 - 36 seconds    Comment: Performed at [facility], [address] [place], [place] [address]  CBG monitoring, ED     Status: Abnormal   Collection Time: 06/30/20  5:02 PM  Result Value Ref Range   Glucose-Capillary 114 (H) 70 - 99 mg/dL    Comment: Glucose reference range applies only to samples taken after fasting for at least 8 hours.  Respiratory Panel by RT PCR (Flu A&B, Covid) - Nasopharyngeal Swab     Status: None   Collection Time: 06/30/20  7:46 PM   Specimen: Nasopharyngeal Swab  Result Value Ref Range   SARS Coronavirus 2 by RT PCR NEGATIVE NEGATIVE    Comment: (NOTE) SARS-CoV-2 target nucleic acids are NOT DETECTED.  The SARS-CoV-2 RNA is generally detectable in upper respiratoy specimens during the acute phase of infection. The lowest concentration of SARS-CoV-2 viral copies this assay can detect is 131 copies/mL. A negative result does not preclude SARS-Cov-2 infection and should not be used as the sole basis for treatment or other patient management decisions. A negative result may occur with  improper specimen collection/handling, submission of specimen other than nasopharyngeal swab, presence of viral mutation(s) within the areas targeted by this assay, and inadequate number of viral copies (<131 copies/mL). A negative result must be combined with clinical observations, patient history, and epidemiological information. The expected result is Negative.  Fact Sheet for Patients:  https://www.moore.com/  Fact Sheet for Healthcare Providers:    https://www.young.biz/  This test is no t yet approved or cleared by the [place] FDA and  has been authorized for detection and/or diagnosis of SARS-CoV-2 by FDA under an Emergency Use Authorization (EUA). This EUA will  remain  in effect (meaning this test can be used) for the duration of the COVID-19 declaration under Section 564(b)(1) of the Act, 21 U.S.C. section 360bbb-3(b)(1), unless the authorization is terminated or revoked sooner.     Influenza A by PCR NEGATIVE NEGATIVE   Influenza B by PCR NEGATIVE NEGATIVE    Comment: (NOTE) The Xpert Xpress SARS-CoV-2/FLU/RSV assay is intended as an aid in  the diagnosis of influenza from Nasopharyngeal swab specimens and  should not be used as a sole basis for treatment. Nasal washings and  aspirates are unacceptable for Xpert Xpress SARS-CoV-2/FLU/RSV  testing.  Fact Sheet for Patients: https://www.moore.com/  Fact Sheet for Healthcare Providers: https://www.young.biz/  This test is not yet approved or cleared by the [place] FDA and  has been authorized for detection and/or diagnosis of SARS-CoV-2 by  FDA under an Emergency Use Authorization (EUA). This EUA will remain  in effect (meaning this test can be used) for the duration of the  Covid-19 declaration under Section 564(b)(1) of the Act, 21  U.S.C. section 360bbb-3(b)(1), unless the authorization is  terminated or revoked. Performed at [facility], [address] [place], [place] [address]   Hemoglobin A1c     Status: Abnormal   Collection Time: 06/30/20 10:22 PM  Result Value Ref Range   Hgb A1c MFr Bld 7.3 (H) 4.8 - 5.6 %    Comment: (NOTE) Pre diabetes:          5.7%-6.4%  Diabetes:              >6.4%  Glycemic control for   <7.0% adults with diabetes    Mean Plasma Glucose 162.81 mg/dL    Comment: Performed at [facility], [address] [place], [place] [address]  CBG  monitoring, ED     Status: Abnormal   Collection Time: 06/30/20 10:58 PM  Result Value Ref Range   Glucose-Capillary 123 (H) 70 - 99 mg/dL    Comment: Glucose reference range applies only to samples taken after fasting for at least 8 hours.  HIV Antibody (routine testing w rflx)     Status: None   Collection Time: 07/01/20  3:14 AM  Result Value Ref Range   HIV Screen 4th Generation wRfx Non Reactive Non Reactive    Comment: Performed at [facility], [address] [place], [place] [address]  CBC     Status: Abnormal   Collection Time: 07/01/20  3:14 AM  Result Value Ref Range   WBC 9.2 4.0 - 10.5 K/uL   RBC 5.47 4.22 - 5.81 MIL/uL   Hemoglobin 13.8 13.0 - 17.0 g/dL   HCT 75.9 39 - 52 %   MCV 78.4 (L) 80.0 - 100.0 fL   MCH 25.2 (L) 26.0 - 34.0 pg   MCHC 32.2 30.0 - 36.0 g/dL   RDW 16.3 84.6 - 65.9 %   Platelets 209 150 - 400 K/uL   nRBC 0.0 0.0 - 0.2 %    Comment: Performed at [facility], [address], [place], [place] [address]  Basic metabolic panel     Status: Abnormal   Collection Time: 07/01/20  3:14 AM  Result Value Ref Range   Sodium 138 135 - 145 mmol/L   Potassium 3.1 (L) 3.5 - 5.1 mmol/L   Chloride 104 98 - 111 mmol/L   CO2 25 22 - 32 mmol/L   Glucose, Bld 121 (H) 70 - 99 mg/dL    Comment:  Glucose reference range applies only to samples taken after fasting for at least 8 hours.   BUN 16 8 - 23 mg/dL   Creatinine, Ser 1.61 (H) 0.61 - 1.24 mg/dL   Calcium 8.9 8.9 - 09.6 mg/dL   GFR, Estimated 50 (L) >60 mL/min    Comment: (NOTE) Calculated using the CKD-EPI Creatinine Equation (2021)    Anion gap 9 5 - 15    Comment: Performed at [facility], [address], [place], [place] [address]  Lipid panel     Status: Abnormal   Collection Time: 07/01/20  3:14 AM  Result Value Ref Range   Cholesterol 79 0 - 200 mg/dL   Triglycerides 72 <981 mg/dL   HDL 26 (L) >19 mg/dL   Total CHOL/HDL Ratio 3.0 RATIO   VLDL 14 0 - 40 mg/dL   LDL Cholesterol  39 0 - 99 mg/dL    Comment:        Total Cholesterol/HDL:CHD Risk Coronary Heart Disease Risk Table                     Men   Women  1/2 Average Risk   3.4   3.3  Average Risk       5.0   4.4  2 X Average Risk   9.6   7.1  3 X Average Risk  23.4   11.0        Use the calculated Patient Ratio above and the CHD Risk Table to determine the patient's CHD Risk.        ATP III CLASSIFICATION (LDL):  <100     mg/dL   Optimal  147-829  mg/dL   Near or Above                    Optimal  130-159  mg/dL   Borderline  562-130  mg/dL   High  >865     mg/dL   Very High Performed at [facility], [address] [place], [place] [address]   Glucose, capillary     Status: Abnormal   Collection Time: 07/01/20  6:01 AM  Result Value Ref Range   Glucose-Capillary 116 (H) 70 - 99 mg/dL    Comment: Glucose reference range applies only to samples taken after fasting for at least 8 hours.   Comment 1 Notify RN    Comment 2 Document in Chart   Glucose, capillary     Status: Abnormal   Collection Time: 07/01/20 12:29 PM  Result Value Ref Range   Glucose-Capillary 132 (H) 70 - 99 mg/dL    Comment: Glucose reference range applies only to samples taken after fasting for at least 8 hours.   Comment 1 Notify RN    Comment 2 Document in Chart   Glucose, capillary     Status: Abnormal   Collection Time: 07/01/20  3:37 PM  Result Value Ref Range   Glucose-Capillary 181 (H) 70 - 99 mg/dL    Comment: Glucose reference range applies only to samples taken after fasting for at least 8 hours.   Comment 1 Notify RN    Comment 2 Document in Chart   Glucose, capillary     Status: Abnormal   Collection Time: 07/01/20  9:02 PM  Result Value Ref Range   Glucose-Capillary 114 (H) 70 - 99 mg/dL    Comment: Glucose reference range applies only to samples taken after fasting for at least 8 hours.  Comment 1 Notify RN    Comment 2 Document in Chart   Glucose, capillary     Status: Abnormal   Collection Time:  07/02/20  6:33 AM  Result Value Ref Range   Glucose-Capillary 115 (H) 70 - 99 mg/dL    Comment: Glucose reference range applies only to samples taken after fasting for at least 8 hours.   Comment 1 Notify RN    Comment 2 Document in Chart     Medications and allergies  No Known Allergies   No current facility-administered medications on file prior to encounter.   Current Outpatient Medications on File Prior to Encounter  Medication Sig Dispense Refill  . amLODipine (NORVASC) 10 MG tablet TAKE 1 TABLET(10 MG) BY MOUTH DAILY (Patient taking differently: Take 10 mg by mouth daily. ) 90 tablet 3  . Candesartan Cilexetil-HCTZ 32-25 MG TABS 1 tab by mouth once daily (Patient taking differently: Take 1 tablet by mouth daily. ) 90 tablet 3  . metFORMIN (GLUCOPHAGE-XR) 500 MG 24 hr tablet TAKE 1 TABLET(500 MG) BY MOUTH DAILY WITH BREAKFAST (Patient taking differently: Take 500 mg by mouth daily with breakfast. ) 90 tablet 0  . metoprolol succinate (TOPROL-XL) 50 MG 24 hr tablet TAKE 1 TABLET BY MOUTH DAILY. TAKE WITH OR IMMEDIATELY FOLLOWING A MEAL (Patient taking differently: Take 50 mg by mouth daily. TAKE WITH OR IMMEDIATELY FOLLOWING A MEAL) 90 tablet 3  . potassium chloride (KLOR-CON) 8 MEQ tablet 2 tab by mouth once daily (Patient taking differently: Take 16 mEq by mouth daily. ) 180 tablet 3  . repaglinide (PRANDIN) 1 MG tablet Take 1 tablet (1 mg total) by mouth 2 (two) times daily before a meal. 180 tablet 3  . rosuvastatin (CRESTOR) 5 MG tablet 1 tab by mouth once daily (Patient taking differently: Take 5 mg by mouth daily. ) 90 tablet 3  . tadalafil (CIALIS) 20 MG tablet Take 20 mg by mouth daily as needed for erectile dysfunction.    . Accu-Chek FastClix Lancets MISC USE TO CHECK BLOOD SUGAR AS DIRECTED ONCE DAILY 102 each 2  . ACCU-CHEK GUIDE test strip USE AS DIRECTED TO TEST BLOOD SUGAR ONCE DAILY 100 strip 0  . Blood Glucose Monitoring Suppl (ACCU-CHEK AVIVA) device Use to monitor  glucose levels daily; E11.21 1 each 0  . tizanidine (ZANAFLEX) 2 MG capsule Take 1 capsule (2 mg total) by mouth 3 (three) times daily as needed for muscle spasms. (Patient not taking: Reported on 06/30/2020) 60 capsule 1  . Vitamin D, Ergocalciferol, (DRISDOL) 1.25 MG (50000 UNIT) CAPS capsule Take 1 capsule (50,000 Units total) by mouth every 7 (seven) days. (Patient not taking: Reported on 06/30/2020) 12 capsule 0   Scheduled Meds: . apixaban  5 mg Oral BID  . diltiazem  30 mg Oral Q8H  . insulin aspart  0-9 Units Subcutaneous TID WC  . metoprolol tartrate  25 mg Oral TID  . rosuvastatin  5 mg Oral q1800   Continuous Infusions: PRN Meds:.acetaminophen **OR** acetaminophen (TYLENOL) oral liquid 160 mg/5 mL **OR** acetaminophen, labetalol, metoprolol tartrate, senna-docusate   I/O last 3 completed shifts: In: 1000 [IV Piggyback:1000] Out: -  No intake/output data recorded.    Radiology:   CT HEAD WO CONTRAST 06/30/2020 CLINICAL DATA:  Transient ischemic attack. Intermittent weakness in left arm, radiating to back of neck and left ear for 1 week. EXAM: CT HEAD WITHOUT CONTRAST TECHNIQUE: Contiguous axial images were obtained from the base of the skull through the vertex  without intravenous contrast. COMPARISON:  No pertinent prior exams are available for comparison. FINDINGS: Brain: Mild generalized cerebral atrophy. Moderate ill-defined hypoattenuation within the cerebral white matter is nonspecific, but compatible with chronic small vessel ischemic disease. There is no acute intracranial hemorrhage. No demarcated cortical infarct. No extra-axial fluid collection. No evidence of intracranial mass. No midline shift. Vascular: No hyperdense vessel.  Atherosclerotic calcifications. Skull: Normal. Negative for fracture or focal lesion. Sinuses/Orbits: Visualized orbits show no acute finding. Moderate mucosal thickening within the inferior left maxillary sinus. Mild scattered paranasal sinus  mucosal thickening elsewhere. IMPRESSION: No evidence of acute intracranial abnormality. Mild cerebral atrophy with moderate chronic small vessel ischemic disease. Paranasal mucosal thickening as described, most notably left maxillary. Electronically Signed   By: Jackey Loge DO   On: 06/30/2020 16:17   MR ANGIO HEAD WO CONTRAST 06/30/2020 CLINICAL DATA:  Stroke.  Right precentral gyrus infarct. EXAM: MRA HEAD WITHOUT CONTRAST TECHNIQUE: Angiographic images of the Circle of Willis were obtained using MRA technique without intravenous contrast. COMPARISON:  Brain MRI 07/01/2019 FINDINGS: POSTERIOR CIRCULATION: --Vertebral arteries: Normal --Inferior cerebellar arteries: Normal. --Basilar artery: Normal. --Superior cerebellar arteries: Normal. --Posterior cerebral arteries: Normal. ANTERIOR CIRCULATION: --Intracranial internal carotid arteries: Normal. --Anterior cerebral arteries (ACA): Normal. --Middle cerebral arteries (MCA): There is severe stenosis or short segment occlusion of the distal M1 segment of the right MCA. The distal branches are patent. Left MCA is normal. ANATOMIC VARIANTS: None IMPRESSION: Severe stenosis versus short segment occlusion of the distal M1 segment of the right MCA. Electronically Signed   By: Deatra Robinson M.D.   On: 06/30/2020 22:43   MR BRAIN WO CONTRAST 06/30/2020 CLINICAL DATA:  Transient ischemia with intermittent left arm weakness. EXAM: MRI HEAD WITHOUT CONTRAST TECHNIQUE: Multiplanar, multiecho pulse sequences of the brain and surrounding structures were obtained without intravenous contrast. COMPARISON:  Head CT 06/30/2020 FINDINGS: Brain: Small acute infarct of the base of the right precentral gyrus. Small area of abnormal diffusion restriction in the right occipital cortex. Multifocal hyperintense T2-weighted signal within the white matter. There is generalized atrophy without lobar predilection. No chronic microhemorrhage. Normal midline structures. Vascular: Normal  flow voids. Skull and upper cervical spine: Normal marrow signal. Sinuses/Orbits: Left maxillary retention cyst.  Normal orbits. Other: None. IMPRESSION: 1. Small acute infarct of the base of the right precentral gyrus. 2. Small area of acute ischemia in the right occipital cortex. 3. Generalized atrophy and findings of chronic small vessel disease. Electronically Signed   By: Deatra Robinson M.D.   On: 06/30/2020 19:04   DG Chest Port 1 View 06/30/2020 CLINICAL DATA:  Left upper extremity weakness, hypertension, tobacco abuse EXAM: PORTABLE CHEST 1 VIEW COMPARISON:  None. FINDINGS: Two frontal views of the chest demonstrate an unremarkable cardiac silhouette. No airspace disease, effusion, or pneumothorax. Interstitial prominence likely due to tobacco abuse. IMPRESSION: 1. No acute intrathoracic process. Electronically Signed   By: Sharlet Salina M.D.   On: 06/30/2020 18:41   Cardiac Studies:   ECHOCARDIOGRAM COMPLETE 07/01/2020 1. Left ventricular ejection fraction, by estimation, is 60 to 65%. The left ventricle has normal function. The left ventricle has no regional wall motion abnormalities. There is mild left ventricular hypertrophy. Left ventricular diastolic parameters are consistent with Grade I diastolic dysfunction (impaired relaxation).   2. Right ventricular systolic function is normal. The right ventricular size is normal. Tricuspid regurgitation signal is inadequate for assessing PA pressure.   3. Left atrial size was severely dilated.   4. The mitral valve  is normal in structure. No evidence of mitral valve regurgitation. No evidence of mitral stenosis. There was chordal systolic anterior motion noted (not valvular).  5. The aortic valve is tricuspid. Aortic valve regurgitation is mild. Mild aortic valve stenosis. Aortic valve mean gradient measures 14.0 mmHg.   6. Aortic dilatation noted. There is mild dilatation of the ascending aorta, measuring 40 mm.  7. The inferior vena cava is normal  in size with greater than 50% respiratory variability, suggesting right atrial pressure of 3 mmHg.  VAS US CAROTID 07/01/2020 Right Carotid: Velocities in the right ICA are consistent with a 1-39% stenosis.  Left Carotid: Velocities in the left ICA are consistent with a 1-39% stenosis.  Vertebrals:  Bilateral vertebral arteries demonstrate antegrade flow.  Subclavians: Normal flow hemodynamics were seen in bilateral subclavian arteries.   EKG   07/02/2020: Sinus rhythm at a rate of 72 bpm, normal axis.  T wave inversions lateral leads, cannot exclude ischemia.  07/01/2020: Atrial fibrillation with rapid ventricular response at a rate of 115 bpm.  Normal axis.   Assessment   1. Acute CVA  2. Paroxysmal atrial fibrillation with RVR  3. Hypertension  4. Type II Diabetes Mellitus  5. Hyperlipidemia    Recommendations:   1. Acute CVA Paroxysmal atrial fibrillation is likely source of acute embolic CVA.  See below recommendations regarding atrial fibrillation management.  2. Paroxysmal atrial fibrillation with RVR  Patient is currently maintaining sinus rhythm.  Recommend patient discharged with Lopressor 50 mg twice daily and diltiazem 120 mg once daily.  Recommend oral anticoagulation with Eliquis.    3. Hypertension Patient's blood pressure was initially well controlled, however permissive hypertension was allowed in view of CVA. Recommend metoprolol tartrate 50 mg twice daily and diltiazem 100 mg daily at discharge.  Patient previously on amlodipine 10 mg and candesartan/HCT 32/25 mg.  We will follow-up in the office and reevaluate blood pressure control and need for resumption of antihypertensive medications.  4. Type II Diabetes Mellitus  Blood glucose has remained stable throughout hospital stay and review of previous A1c shows diabetes is well controlled.  Will defer further management to endocrinology and PCP outpatient.  5. Hyperlipidemia  Lipids are under excellent control,  recommend continuing rosuvastatin 5 mg daily.  Patient is feeling well and maintaining sinus rhythm, feel he is stable for discharge from a cardiovascular standpoint.  We will follow-up closely in the office for management of atrial fibrillation, hypertension, and hyperlipidemia.  Patient was seen in collaboration with Dr. Jacinto Halim. He also reviewed patient's chart and examined the patient. Dr. Jacinto Halim is in agreement of the plan.    Rayford Halsted, PA-C 07/02/2020, 10:37 AM Office: [phone]

## 2020-07-02 NOTE — Discharge Instructions (Signed)

## 2020-07-02 NOTE — Progress Notes (Signed)
STROKE TEAM PROGRESS NOTE   INTERVAL HISTORY He feels well would like to go home today.  Vitals:   07/01/20 1921 07/02/20 0016 07/02/20 0429 07/02/20 0831  BP: 121/87 132/81 102/78 (!) 152/85  Pulse: 77 65 62 70  Resp:  19 19 18   Temp: 98.1 F (36.7 C) 98.1 F (36.7 C) 98.6 F (37 C) 98.1 F (36.7 C)  TempSrc: Oral Oral Oral Oral  SpO2: 98% 99% 99% 98%  Weight:      Height:       CBC:  Recent Labs  Lab 06/30/20 1456 06/30/20 1456 06/30/20 1527 07/01/20 0314  WBC 11.1*  --   --  9.2  NEUTROABS 6.1  --   --   --   HGB 15.8   < > 17.3* 13.8  HCT 49.5   < > 51.0 42.9  MCV 79.7*  --   --  78.4*  PLT 239  --   --  209   < > = values in this interval not displayed.   Basic Metabolic Panel:  Recent Labs  Lab 06/30/20 1456 06/30/20 1456 06/30/20 1527 07/01/20 0314  NA 135   < > 139 138  K 3.5   < > 3.5 3.1*  CL 98   < > 99 104  CO2 24  --   --  25  GLUCOSE 150*   < > 144* 121*  BUN 20   < > 22 16  CREATININE 1.74*   < > 1.70* 1.52*  CALCIUM 9.6  --   --  8.9   < > = values in this interval not displayed.   Lipid Panel:  Recent Labs  Lab 07/01/20 0314  CHOL 79  TRIG 72  HDL 26*  CHOLHDL 3.0  VLDL 14  LDLCALC 39   HgbA1c:  Recent Labs  Lab 06/30/20 2222  HGBA1C 7.3*   Urine Drug Screen: No results for input(s): LABOPIA, COCAINSCRNUR, LABBENZ, AMPHETMU, THCU, LABBARB in the last 168 hours.  Alcohol Level No results for input(s): ETH in the last 168 hours.  IMAGING past 24 hours VAS 13/10/21 LOWER EXTREMITY VENOUS (DVT)  Result Date: 07/02/2020  Lower Venous DVT Study Indications: Stroke.  Comparison Study: No prior study Performing Technologist: 13/07/2020 MHA, RDMS, RVT, RDCS  Examination Guidelines: A complete evaluation includes B-mode imaging, spectral Doppler, color Doppler, and power Doppler as needed of all accessible portions of each vessel. Bilateral testing is considered an integral part of a complete examination. Limited examinations for  reoccurring indications may be performed as noted. The reflux portion of the exam is performed with the patient in reverse Trendelenburg.  +---------+---------------+---------+-----------+----------+--------------+ RIGHT    CompressibilityPhasicitySpontaneityPropertiesThrombus Aging +---------+---------------+---------+-----------+----------+--------------+ CFV      Full           Yes      Yes                                 +---------+---------------+---------+-----------+----------+--------------+ SFJ      Full                                                        +---------+---------------+---------+-----------+----------+--------------+ FV Prox  Full                                                        +---------+---------------+---------+-----------+----------+--------------+  FV Mid   Full                                                        +---------+---------------+---------+-----------+----------+--------------+ FV DistalFull                                                        +---------+---------------+---------+-----------+----------+--------------+ PFV      Full                                                        +---------+---------------+---------+-----------+----------+--------------+ POP      Full           Yes      Yes                                 +---------+---------------+---------+-----------+----------+--------------+ PTV      Full                                                        +---------+---------------+---------+-----------+----------+--------------+ PERO     Full                                                        +---------+---------------+---------+-----------+----------+--------------+   +---------+---------------+---------+-----------+----------+--------------+ LEFT     CompressibilityPhasicitySpontaneityPropertiesThrombus Aging  +---------+---------------+---------+-----------+----------+--------------+ CFV      Full           Yes      Yes                                 +---------+---------------+---------+-----------+----------+--------------+ SFJ      Full                                                        +---------+---------------+---------+-----------+----------+--------------+ FV Prox  Full                                                        +---------+---------------+---------+-----------+----------+--------------+ FV Mid   Full                                                        +---------+---------------+---------+-----------+----------+--------------+   FV DistalFull                                                        +---------+---------------+---------+-----------+----------+--------------+ PFV      Full                                                        +---------+---------------+---------+-----------+----------+--------------+ POP      Full           Yes      Yes                                 +---------+---------------+---------+-----------+----------+--------------+ PTV      Full                                                        +---------+---------------+---------+-----------+----------+--------------+ PERO     Full                                                        +---------+---------------+---------+-----------+----------+--------------+     Summary: RIGHT: - There is no evidence of deep vein thrombosis in the lower extremity.  - No cystic structure found in the popliteal fossa.  LEFT: - There is no evidence of deep vein thrombosis in the lower extremity.  - No cystic structure found in the popliteal fossa.  *See table(s) above for measurements and observations.    Preliminary     PHYSICAL EXAM Pleasant obese middle-age African-American male not in distress. . Afebrile. Head is nontraumatic. Neck is supple without bruit.     Cardiac exam no murmur or gallop. Lungs are clear to auscultation. Distal pulses are well felt. Neurological Exam ;  Awake  Alert oriented x 3. Normal speech and language.eye movements full without nystagmus.fundi were not visualized. Vision acuity and fields appear normal. Hearing is normal. Palatal movements are normal. Face symmetric. Tongue midline. Normal strength, tone, reflexes and coordination. Normal sensation. Gait deferred.  ASSESSMENT/PLAN [name] is a 69 y.o. male with history of DM2, HTN, HLD, CKD III, OA, smoking presenting with recurrent episode of L sided clumsiness and tingling.    Stroke:   R MCA and R occipital infarcts felt to be embolic secondary to unknown source though cannot rule out large vessel disease given R M2 stenosis   CT head No acute abnormality. Small vessel disease. Atrophy. Sinus dz.  MRI  Small R precentral gyrus infarct. Small R occipital cortex infarct. Small vessel disease. Atrophy.   MRA  Severe distal R M2 stenosis   Carotid Doppler  B ICA 1-39% stenosis, VAs antegrade   2D Echo EF 60-65%. No source of embolus. LA dilated  Canceled TEE and loop  LDL 39  HgbA1c 7.3  VTE prophylaxis - Lovenox  40 mg sq daily   No antithrombotic prior to admission, now on aspirin 325 mg daily and clopidogrel 75 mg daily. Changed to Eliquis given new diagnosis of atrial fibrillation.    Therapy recommendations:  No therapy needs  Disposition:  Return home Ok for d/c from stroke standpoint Follow-up Stroke Clinic at [facility] in 4 weeks.   Atrial Fibrillation, new onset in hospital  AF noted on tele  TEE and loop canceled - discussed with EP, cardiology and attending  CHA2DS2-VASc Score = at least 5 , ?2 oral anticoagulation recommended  Age in Years:  39-74   +1  Sex:  Male   0    Hypertension History:  yes   +1     Diabetes Mellitus:  yes   +1  Congestive Heart Failure History:  0  Vascular Disease History:  0      Stroke/TIA/Thromboembolism History:  yes   +2 . Continue Eliquis (apixaban) daily at discharge   Hypertension  Stable . Permissive hypertension (OK if < 220/120) but gradually normalize in 5-7 days . Long-term BP goal normotensive  Hyperlipidemia  Home meds:  crestor 5  Now on crestor 20  LDL 39, goal < 70  Will resume home dose crestor given LDL less than goal  Continue statin at discharge  Diabetes type II Uncontrolled  HgbA1c 7.3, goal < 7.0  Other Stroke Risk Factors  Advanced Age >/= 72   Cigarette smoker, advised to stop smoking  Obesity, Body mass index is 34.68 kg/m., BMI >/= 30 associated with increased stroke risk, recommend weight loss, diet and exercise as appropriate   Likely Obstructive sleep apnea, needs OP evaluation  Other Active Problems  AKI on CKD IIIb  Hospital day # 1  Personally examined patient and images, and have participated in and made any corrections needed to history, physical, neuro exam,assessment and plan as stated above.  I have personally obtained the history, evaluated lab date, reviewed imaging studies and agree with radiology interpretations.    Naomie Dean, MD Stroke Neurology  I spent 15 minutes of face-to-face and non-face-to-face time with patient. This included prechart review, lab review, study review, order entry, electronic health record documentation, patient education on the different diagnostic and therapeutic options, counseling and coordination of care, risks and benefits of management, compliance, or risk factor reduction    To contact Stroke Continuity provider, please refer to [email or web address]. After hours, contact General Neurology

## 2020-07-04 ENCOUNTER — Emergency Department (HOSPITAL_COMMUNITY)
Admission: EM | Admit: 2020-07-04 | Discharge: 2020-07-04 | Disposition: A | Discharge status: Home / Self Care | Payer: Medicare Other | Attending: Emergency Medicine | Admitting: Emergency Medicine

## 2020-07-04 ENCOUNTER — Other Ambulatory Visit: Payer: Self-pay

## 2020-07-04 ENCOUNTER — Encounter (HOSPITAL_COMMUNITY): Payer: Self-pay | Admitting: Emergency Medicine

## 2020-07-04 DIAGNOSIS — I10 Essential (primary) hypertension: Secondary | ICD-10-CM

## 2020-07-04 DIAGNOSIS — Z7984 Long term (current) use of oral hypoglycemic drugs: Secondary | ICD-10-CM | POA: Insufficient documentation

## 2020-07-04 DIAGNOSIS — F17210 Nicotine dependence, cigarettes, uncomplicated: Secondary | ICD-10-CM | POA: Insufficient documentation

## 2020-07-04 DIAGNOSIS — E1122 Type 2 diabetes mellitus with diabetic chronic kidney disease: Secondary | ICD-10-CM | POA: Insufficient documentation

## 2020-07-04 DIAGNOSIS — Z7982 Long term (current) use of aspirin: Secondary | ICD-10-CM | POA: Diagnosis not present

## 2020-07-04 DIAGNOSIS — N183 Chronic kidney disease, stage 3 unspecified: Secondary | ICD-10-CM | POA: Diagnosis not present

## 2020-07-04 DIAGNOSIS — I129 Hypertensive chronic kidney disease with stage 1 through stage 4 chronic kidney disease, or unspecified chronic kidney disease: Secondary | ICD-10-CM | POA: Insufficient documentation

## 2020-07-04 DIAGNOSIS — Z79899 Other long term (current) drug therapy: Secondary | ICD-10-CM | POA: Insufficient documentation

## 2020-07-04 HISTORY — DX: Cerebral infarction, unspecified: I63.9

## 2020-07-04 LAB — CBC
HCT: 48.5 % (ref 39.0–52.0)
Hemoglobin: 15.2 g/dL (ref 13.0–17.0)
MCH: 25.7 pg — ABNORMAL LOW (ref 26.0–34.0)
MCHC: 31.3 g/dL (ref 30.0–36.0)
MCV: 82.1 fL (ref 80.0–100.0)
Platelets: 210 10*3/uL (ref 150–400)
RBC: 5.91 MIL/uL — ABNORMAL HIGH (ref 4.22–5.81)
RDW: 15.6 % — ABNORMAL HIGH (ref 11.5–15.5)
WBC: 6.9 10*3/uL (ref 4.0–10.5)
nRBC: 0 % (ref 0.0–0.2)

## 2020-07-04 LAB — BASIC METABOLIC PANEL
Anion gap: 10 (ref 5–15)
BUN: 16 mg/dL (ref 8–23)
CO2: 23 mmol/L (ref 22–32)
Calcium: 9 mg/dL (ref 8.9–10.3)
Chloride: 104 mmol/L (ref 98–111)
Creatinine, Ser: 1.4 mg/dL — ABNORMAL HIGH (ref 0.61–1.24)
GFR, Estimated: 55 mL/min — ABNORMAL LOW (ref >60)
Glucose, Bld: 84 mg/dL (ref 70–99)
Potassium: 3.5 mmol/L (ref 3.5–5.1)
Sodium: 137 mmol/L (ref 135–145)

## 2020-07-04 MED ORDER — BENZONATATE 100 MG PO CAPS: 100.0000 mg | ORAL_CAPSULE | Freq: Three times a day (TID) | ORAL | 0 refills | Status: DC

## 2020-07-04 NOTE — ED Notes (Signed)
ED Provider at bedside. 

## 2020-07-04 NOTE — ED Notes (Signed)
Patient verbalizes understanding of discharge instructions. Opportunity for questioning and answers were provided. Arm band removed by staff, patient discharged from ED. 

## 2020-07-04 NOTE — ED Provider Notes (Signed)
[facility] EMERGENCY DEPARTMENT Provider Note   CSN: [id number] Arrival date & time: 07/04/20  3710     History Chief Complaint  Patient presents with  . Hypertension    Roberto Rivera is a 69 y.o. male.  HPI   Patient presents to the ED for evaluation of hypertension. Patient states he was in the hospital recently and was diagnosed with a stroke. He was admitted on November 10 and just left the hospital 2 days ago. Patient was hypertensive during hospitalization but permissive hypertension was allowed considering his acute stroke. Patient's medications were resumed when he left the hospital. Patient did notice his blood pressure was rather elevated today over 200. He checked it again and it was 180. Patient did take his medications before he came to the ED. He denied any acute neurologic changes. He is not having any chest pain or shortness of breath. He may have had some ringing in his ears initially  Past Medical History:  Diagnosis Date  . ALLERGIC RHINITIS 04/01/2007  . Diabetes mellitus   . DIABETES MELLITUS, TYPE II 04/01/2007  . 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 Hypercholesteremia   . HYPERCHOLESTEROLEMIA 02/07/2008  . Hypertension   . HYPERTENSION 04/01/2007  . MICROCYTOSIS 02/07/2008  . OSTEOARTHRITIS, SPINE 02/07/2008  . Stroke (HCC)   . THYROID CYST 02/07/2008    Patient Active Problem List   Diagnosis Date Noted  . Acute CVA (cerebrovascular accident) (HCC) 06/30/2020  . Hyperlipidemia associated with type 2 diabetes mellitus (HCC) 06/30/2020  . Acute kidney injury superimposed on chronic kidney disease (HCC) 06/30/2020  . Low back pain 08/26/2019  . CKD (chronic kidney disease) stage 3, GFR 30-59 ml/min (HCC) 02/05/2019  . Left groin pain 02/05/2019  . Hypokalemia 06/26/2017  . Back pain 05/24/2017  . Abnormal ECG 04/25/2017  . Screening examination for infectious disease 03/10/2016  . Skin nodule 02/26/2015  . Preventative health care  04/26/2013  . Rash and nonspecific skin eruption 04/26/2013  . Left knee pain 04/11/2013  . Diabetes (HCC) 10/13/2011  . Screening for prostate cancer 10/13/2011  . Encounter for long-term (current) use of other medications 10/13/2011  . Tobacco use 02/04/2010  . Edema 02/04/2010  . PROTEINURIA, MILD 01/29/2009  . THYROID CYST 02/07/2008  . HYPERCHOLESTEROLEMIA 02/07/2008  . 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 OSTEOARTHRITIS, SPINE 02/07/2008  . MICROCYTOSIS 02/07/2008  . Hypertension associated with diabetes (HCC) 04/01/2007  . ALLERGIC RHINITIS 04/01/2007    History reviewed. No pertinent surgical history.     Family History  Problem Relation Age of Onset  . Cancer Mother        had uncertain type of cancer  . Stroke Sister     Social History   Tobacco Use  . Smoking status: Current Every Day Smoker    Packs/day: 1.00    Types: Cigarettes  . Smokeless tobacco: Never Used  Substance Use Topics  . Alcohol use: No    Alcohol/week: 0.0 standard drinks  . Drug use: No    Home Medications Prior to Admission medications   Medication Sig Start Date End Date Taking? Authorizing Provider  Accu-Chek FastClix Lancets MISC USE TO CHECK BLOOD SUGAR AS DIRECTED ONCE DAILY 03/24/19   Romero Belling, MD  ACCU-CHEK GUIDE test strip USE AS DIRECTED TO TEST BLOOD SUGAR ONCE DAILY 01/31/20   Romero Belling, MD  apixaban (ELIQUIS) 5 MG TABS tablet Take 1 tablet (5 mg total) by mouth 2 (two) times daily. 07/02/20   Ghimire, [employer]  M, MD  benzonatate (TESSALON) 100 MG capsule Take 1 capsule (100 mg total) by mouth every 8 (eight) hours. 07/04/20   Linwood Dibbles, MD  Blood Glucose Monitoring Suppl (ACCU-CHEK AVIVA) device Use to monitor glucose levels daily; E11.21 12/27/18 12/27/19  Romero Belling, MD  diltiazem (CARDIZEM CD) 120 MG 24 hr capsule Take 1 capsule (120 mg total) by mouth daily. 07/02/20 07/02/21  Ghimire, Werner Lean, MD  metFORMIN (GLUCOPHAGE-XR) 500 MG 24 hr tablet TAKE 1  TABLET(500 MG) BY MOUTH DAILY WITH BREAKFAST Patient taking differently: Take 500 mg by mouth daily with breakfast.  03/17/20   Romero Belling, MD  metoprolol tartrate (LOPRESSOR) 50 MG tablet Take 1 tablet (50 mg total) by mouth 2 (two) times daily. 07/02/20   Ghimire, Werner Lean, MD  repaglinide (PRANDIN) 1 MG tablet Take 1 tablet (1 mg total) by mouth 2 (two) times daily before a meal. 05/26/20   Romero Belling, MD  rosuvastatin (CRESTOR) 5 MG tablet 1 tab by mouth once daily Patient taking differently: Take 5 mg by mouth daily.  01/28/20   Corwin Levins, MD  tadalafil (CIALIS) 20 MG tablet Take 20 mg by mouth daily as needed for erectile dysfunction.    [provider]    Allergies    Patient has no known allergies.  Review of Systems   Review of Systems  All other systems reviewed and are negative.   Physical Exam Updated Vital Signs BP (!) 149/82   Pulse 68   Temp 98.3 F (36.8 C) (Oral)   Resp 20   Ht 1.803 m (5\' 11" )   Wt 118.4 kg   SpO2 98%   BMI 36.40 kg/m   Physical Exam Vitals and nursing note reviewed.  Constitutional:      General: He is not in acute distress.    Appearance: He is well-developed.  HENT:     Head: Normocephalic and atraumatic.     Right Ear: External ear normal.     Left Ear: External ear normal.  Eyes:     General: No scleral icterus.       Right eye: No discharge.        Left eye: No discharge.     Conjunctiva/sclera: Conjunctivae normal.  Neck:     Trachea: No tracheal deviation.  Cardiovascular:     Rate and Rhythm: Normal rate and regular rhythm.  Pulmonary:     Effort: Pulmonary effort is normal. No respiratory distress.     Breath sounds: Normal breath sounds. No stridor. No wheezing or rales.  Abdominal:     General: Bowel sounds are normal. There is no distension.     Palpations: Abdomen is soft.     Tenderness: There is no abdominal tenderness. There is no guarding or rebound.  Musculoskeletal:        General: No  tenderness.     Cervical back: Neck supple.  Skin:    General: Skin is warm and dry.     Findings: No rash.  Neurological:     Mental Status: He is alert.     Cranial Nerves: No cranial nerve deficit (no facial droop, extraocular movements intact, no slurred speech).     Sensory: No sensory deficit.     Motor: No abnormal muscle tone or seizure activity.     Coordination: Coordination normal.     ED Results / Procedures / Treatments   Labs (all labs ordered are listed, but only abnormal results are displayed) Labs Reviewed  CBC - Abnormal; Notable for the following components:      Result Value   RBC 5.91 (*)    MCH 25.7 (*)    RDW 15.6 (*)    All other components within normal limits  BASIC METABOLIC PANEL - Abnormal; Notable for the following components:   Creatinine, Ser 1.40 (*)    GFR, Estimated 55 (*)    All other components within normal limits    EKG EKG Interpretation  Date/Time:  Sunday July 04 2020 06:58:59 EST Ventricular Rate:  67 PR Interval:  182 QRS Duration: 98 QT Interval:  434 QTC Calculation: 458 R Axis:   -17 Text Interpretation: Sinus rhythm with occasional Premature ventricular complexes Nonspecific T wave abnormality Abnormal ECG pvc new since last tracing Confirmed by Ishita Mcnerney ([id number]) on 07/04/2020 10:02:43 AM   Radiology No results found.  Procedures Procedures (including critical care time)  Medications Ordered in ED Medications - No data to display  ED Course  I have reviewed the triage vital signs and the nursing notes.  Pertinent labs & imaging results that were available during my care of the patient were reviewed by me and considered in my medical decision making (see chart for details).  Clinical Course as of Jul 04 1157  Sun Jul 04, 2020  1002 Pressure 148 systolic at the bedside   [JK]  1145 Labs reviewed.  Metabolic panel unremarkable.  Creatinine slightly elevated but consistent with previous values   [JK]  1145  Blood pressure has remained stable.   [JK]    Clinical Course User Index [JK] Tela Kotecki, MD   MDM Rules/Calculators/A&P                         Patient presented to ED for hypertension. Essentially asymptomatic. No signs of emergency medical condition associated with his hypertension. Patient just left the hospital recently after having a stroke. When the patient left the hospital he was instructed to discontinue his Norvasc and his candesartan. Patient was supposed to resume his hypertension medications per the dc summary  notes. His discharge paperwork however instructed him to resume Cardizem and metoprolol and stop his candesartan and Norvasc. I will have the patient restart his candesartan at this time as he has had persistent hypertension. He is already on a calcium channel blocker so I will have him hold off on restarting the Norvasc at this time. Recommend outpatient follow-up with his primary care doctor to review his blood pressure medications.  Incidental mild cough.  Non productive.  Will give an rx for tessalon to avoid decongestant medications  Final Clinical Impression(s) / ED Diagnoses Final diagnoses:  None    Rx / DC Orders ED Discharge Orders         Ordered    benzonatate (TESSALON) 100 MG capsule  Every 8 hours        11 /14/21 1157           05-28-1968, MD 07/04/20 1158

## 2020-07-04 NOTE — ED Triage Notes (Signed)
Pt reports BP 200/111 this morning.  Took BP medication and checked BP 15 min later and it was 198/111.  Reports R ear pain when he yawns.  Pt states he was admitted this past week for a stroke.  Denies chest pain and SOB.

## 2020-07-04 NOTE — Discharge Instructions (Signed)
Resume your candesartan blood pressure medication.  Follow up with Dr Jonny Ruiz to check on your blood pressure

## 2020-07-05 ENCOUNTER — Telehealth: Payer: Self-pay

## 2020-07-05 ENCOUNTER — Other Ambulatory Visit: Payer: Self-pay

## 2020-07-05 NOTE — Telephone Encounter (Signed)
Called patient, NA, no VM-box to leave message.

## 2020-07-05 NOTE — Telephone Encounter (Cosign Needed)
Transition Care Management Follow-up Telephone Call  Date of discharge and from where: 11/12/201 from [facility]  How have you been since you were released from the hospital? Feeling good; bp still elevated (this morning it was 169/98)  Any questions or concerns? Yes (very concerned about bp readings)  Items Reviewed:  Did the pt receive and understand the discharge instructions provided? Yes   Medications obtained and verified? Yes   Other? No   Any new allergies since your discharge? No   Dietary orders reviewed? Yes (low sodium heart healthy)  Do you have support at home? Yes  (wife and brother-in-law)  Home Care and Equipment/Supplies: Were home health services ordered? no If so, what is the name of the agency? n/a  Has the agency set up a time to come to the patient's home? not applicable Were any new equipment or medical supplies ordered?  No What is the name of the medical supply agency? n/a Were you able to get the supplies/equipment? not applicable Do you have any questions related to the use of the equipment or supplies? No  Functional Questionnaire: (I = Independent and D = Dependent) ADLs: I  Bathing/Dressing- I  Meal Prep- I  Eating- I  Maintaining continence- I  Transferring/Ambulation- I  Managing Meds- I  Follow up appointments reviewed:   PCP Hospital f/u appt confirmed? Yes  Scheduled to see Oliver Barre, MD on 07/06/2020 @ 3:00 pm.  Specialist [facility] f/u appt confirmed? Yes  Scheduled to see Pamella Pert, MD  on 07/12/2020 @ 9:00 am.  Are transportation arrangements needed? No   If their condition worsens, is the pt aware to call PCP or go to the Emergency Dept.? Yes  Was the patient provided with contact information for the PCP's office or ED? Yes  Was to pt encouraged to call back with questions or concerns? Yes

## 2020-07-05 NOTE — Telephone Encounter (Signed)
Called and reviewed with pt. Pt stated that a 30 days supply of Eliquis cost ~$140. Pt did have a free 30-day coupon card, but was unable to use it at the pharmacy. Pt picked up the $140 Eliquis and continues to tolerate it well without any noted diathesis. Pt stated that he already called [employer] and started a patient assistance application last Thursday. Waiting for a response back. Reviewed potentially considering warfarin due to cost concerns. Pt stated that he wants to wait and see how the patient assistance process goes before making a decision. Pt has an OV w/ Celeste on 07/12/20 and will discuss it further.

## 2020-07-06 ENCOUNTER — Telehealth: Payer: Self-pay

## 2020-07-06 ENCOUNTER — Ambulatory Visit (INDEPENDENT_AMBULATORY_CARE_PROVIDER_SITE_OTHER): Payer: Medicare Other | Admitting: Internal Medicine

## 2020-07-06 ENCOUNTER — Encounter: Payer: Self-pay | Admitting: Internal Medicine

## 2020-07-06 ENCOUNTER — Other Ambulatory Visit: Payer: Self-pay

## 2020-07-06 VITALS — BP 150/80 | HR 64 | Temp 98.2°F | Ht 71.0 in | Wt 252.0 lb

## 2020-07-06 DIAGNOSIS — E7800 Pure hypercholesterolemia, unspecified: Secondary | ICD-10-CM | POA: Diagnosis not present

## 2020-07-06 DIAGNOSIS — E1169 Type 2 diabetes mellitus with other specified complication: Secondary | ICD-10-CM

## 2020-07-06 DIAGNOSIS — E1121 Type 2 diabetes mellitus with diabetic nephropathy: Secondary | ICD-10-CM

## 2020-07-06 DIAGNOSIS — E1159 Type 2 diabetes mellitus with other circulatory complications: Secondary | ICD-10-CM | POA: Diagnosis not present

## 2020-07-06 DIAGNOSIS — N179 Acute kidney failure, unspecified: Secondary | ICD-10-CM | POA: Diagnosis not present

## 2020-07-06 DIAGNOSIS — N189 Chronic kidney disease, unspecified: Secondary | ICD-10-CM | POA: Diagnosis not present

## 2020-07-06 DIAGNOSIS — I480 Paroxysmal atrial fibrillation: Secondary | ICD-10-CM

## 2020-07-06 DIAGNOSIS — Z8673 Personal history of transient ischemic attack (TIA), and cerebral infarction without residual deficits: Secondary | ICD-10-CM

## 2020-07-06 DIAGNOSIS — E785 Hyperlipidemia, unspecified: Secondary | ICD-10-CM

## 2020-07-06 DIAGNOSIS — I152 Hypertension secondary to endocrine disorders: Secondary | ICD-10-CM

## 2020-07-06 MED ORDER — METOPROLOL TARTRATE 50 MG PO TABS
50.0000 mg | ORAL_TABLET | Freq: Two times a day (BID) | ORAL | 1 refills | Status: DC
Start: 2020-07-06 — End: 2020-08-16

## 2020-07-06 MED ORDER — METFORMIN HCL ER 500 MG PO TB24: ORAL_TABLET | ORAL | 1 refills | Status: DC

## 2020-07-06 NOTE — Progress Notes (Signed)
Subjective:    Patient ID: Roberto Rivera, male    DOB: 1951-07-09, 69 y.o.   MRN: [id number]  HPI  Here to f/u post hospn nov 10-11 who presented with left-sided weakness-further evaluation revealed acute CVA.  Symptoms were primarily left hand weakness; on nov 11 was noted to have afib with RVR.  Other testing includes A1c 7.3, LDL 39, carotid dopplers neg, Echo with normal EF, gr 1 DD.  Pt seen per cardiology, neurology.  CVA felt to be embolic pt placed on Eliquis with plan to f/u neurology.  Also had mild AKI felt to be low volume related and corrected.  BP elevated tx as permissive with BP meds to be resumed at d/c.  Pt remained on statin.  Pt felt to be stable for d/c with plan to f/u neurology stroke clinic and Cardiology Dr Jacinto Halim.  Pt has appts for both.did have elevated BP for which he was seen in ED.  Pt has no new complaints and left hand weakness essentially resolved with only very minor weakness persistent.  Pt denies chest pain, increased sob or doe, wheezing, orthopnea, PND, increased LE swelling, palpitations, dizziness or syncope.  Pt denies new neurological symptoms such as new headache, or facial or extremity weakness or numbness   Pt denies polydipsia, polyuria Past Medical History:  Diagnosis Date  . ALLERGIC RHINITIS 04/01/2007  . Diabetes mellitus   . DIABETES MELLITUS, TYPE II 04/01/2007  . 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 Hypercholesteremia   . HYPERCHOLESTEROLEMIA 02/07/2008  . Hypertension   . HYPERTENSION 04/01/2007  . MICROCYTOSIS 02/07/2008  . OSTEOARTHRITIS, SPINE 02/07/2008  . Stroke (HCC)   . THYROID CYST 02/07/2008   History reviewed. No pertinent surgical history.  reports that he quit smoking about 2 weeks ago. His smoking use included cigarettes. He smoked 1.00 pack per day. He has never used smokeless tobacco. He reports that he does not drink alcohol and does not use drugs. family history includes Cancer in his mother; Stroke in his sister. No Known  Allergies Current Outpatient Medications on File Prior to Visit  Medication Sig Dispense Refill  . Accu-Chek FastClix Lancets MISC USE TO CHECK BLOOD SUGAR AS DIRECTED ONCE DAILY 102 each 2  . ACCU-CHEK GUIDE test strip USE AS DIRECTED TO TEST BLOOD SUGAR ONCE DAILY 100 strip 0  . apixaban (ELIQUIS) 5 MG TABS tablet Take 1 tablet (5 mg total) by mouth 2 (two) times daily. 60 tablet 1  . benzonatate (TESSALON) 100 MG capsule Take 1 capsule (100 mg total) by mouth every 8 (eight) hours. (Patient taking differently: Take 100 mg by mouth as needed. ) 21 capsule 0  . Candesartan Cilexetil-HCTZ 32-25 MG TABS Take by mouth.    . diltiazem (CARDIZEM CD) 120 MG 24 hr capsule Take 1 capsule (120 mg total) by mouth daily. 30 capsule 11  . repaglinide (PRANDIN) 1 MG tablet Take 1 tablet (1 mg total) by mouth 2 (two) times daily before a meal. 180 tablet 3  . rosuvastatin (CRESTOR) 5 MG tablet 1 tab by mouth once daily (Patient taking differently: Take 5 mg by mouth daily. ) 90 tablet 3  . tadalafil (CIALIS) 20 MG tablet Take 20 mg by mouth daily as needed for erectile dysfunction.    . Blood Glucose Monitoring Suppl (ACCU-CHEK AVIVA) device Use to monitor glucose levels daily; E11.21 1 each 0   No current facility-administered medications on file prior to visit.   Transitional Care Management elements noted today: 1)  Date of D/C: as above 2)  Medication reconciliation:  done today at end visit 3)  Review of D/C summary or other information:  done today 4)  Review of need for f/u on pending diagnostic tests and treatments:  done today 5)  Review of need for Interaction with other providers who will assume or resume care of pt specific problems: done today 6)  Education of patient/family/guardian or caregiver: done today 7)  Assess for Establishment or Re-establishment of referrals and arranging for needed community resources:  done today 8)  Assess for Assistance in scheduling any required follow up with  community providers and services:  done today Review of Systems All otherwise neg per pt    Objective:   Physical Exam BP (!) 150/80 (BP Location: Left Arm, Patient Position: Sitting, Cuff Size: Large)   Pulse 64   Temp 98.2 F (36.8 C) (Oral)   Ht 5\' 11"  (1.803 m)   Wt 252 lb (114.3 kg)   SpO2 96%   BMI 35.15 kg/m  VS noted,  Constitutional: Pt appears in NAD HENT: Head: NCAT.  Right Ear: External ear normal.  Left Ear: External ear normal.  Eyes: . Pupils are equal, round, and reactive to light. Conjunctivae and EOM are normal Nose: without d/c or deformity Neck: Neck supple. Gross normal ROM Cardiovascular: Normal rate and regular rhythm.   Pulmonary/Chest: Effort normal and breath sounds without rales or wheezing.  Abd:  Soft, NT, ND, + BS, no organomegaly Neurological: Pt is alert. At baseline orientation, motor grossly intact Skin: Skin is warm. No rashes, other new lesions, no LE edema Psychiatric: Pt behavior is normal without agitation  All otherwise neg per pt Lab Results  Component Value Date   WBC 7.2 07/06/2020   HGB 17.0 07/06/2020   HCT 52.1 (H) 07/06/2020   PLT 210.0 07/06/2020   GLUCOSE 75 07/06/2020   CHOL 79 07/01/2020   TRIG 72 07/01/2020   HDL 26 (L) 07/01/2020   LDLCALC 39 07/01/2020   ALT 21 07/06/2020   AST 25 07/06/2020   NA 137 07/06/2020   K 3.3 (L) 07/06/2020   CL 98 07/06/2020   CREATININE 1.60 (H) 07/06/2020   BUN 21 07/06/2020   CO2 29 07/06/2020   TSH 1.64 01/28/2020   PSA 1.64 01/28/2020   INR 1.0 06/30/2020   HGBA1C 7.3 (H) 06/30/2020   MICROALBUR 14.5 (H) 01/28/2020      Assessment & Plan:

## 2020-07-06 NOTE — Telephone Encounter (Signed)
Toc  done today 07/06/20

## 2020-07-06 NOTE — Telephone Encounter (Signed)
Location of hospitalization: [place] Reason for hospitalization: HTN Date of discharge: 07/04/20 Date of first communication with patient: today Person contacting patient: Me Current symptoms: bp has been high 177/98 before meds Do you understand why you were in the Hospital: Yes Questions regarding discharge instructions: None Where were you discharged to: Home Medications reviewed: Yes Allergies reviewed: Yes Dietary changes reviewed: Yes. Discussed low fat and low salt diet.  Referals reviewed: NA Activities of Daily Living: Able to with mild limitations Any transportation issues/concerns: None Any patient concerns: None Confirmed importance & date/time of Follow up appt: Yes Confirmed with patient if condition begins to worsen call. Pt was given the office number and encouraged to call back with questions or concerns: Yes

## 2020-07-06 NOTE — Patient Instructions (Addendum)
Please continue all other medications as before, including the recent restart of the BP medicine  Please have the pharmacy call with any other refills you may need.  Please continue your efforts at being more active, low cholesterol diet, and weight control.  Please keep your appointments with your specialists as you may have planned  Please go to the LAB at the blood drawing area for the tests to be done  You will be contacted by phone if any changes need to be made immediately.  Otherwise, you will receive a letter about your results with an explanation, but please check with MyChart first.  Please remember to sign up for MyChart if you have not done so, as this will be important to you in the future with finding out test results, communicating by private email, and scheduling acute appointments online when needed.  Please make an Appointment to return in 4 months, or sooner if needed

## 2020-07-06 NOTE — Telephone Encounter (Signed)
-----   Message from April Harrington, [place] sent at 07/02/2020  4:44 PM EST ----- Regarding: TOC TOC needed per [facility]

## 2020-07-07 ENCOUNTER — Encounter: Payer: Self-pay | Admitting: Internal Medicine

## 2020-07-07 LAB — BASIC METABOLIC PANEL
BUN: 21 mg/dL (ref 6–23)
CO2: 29 mEq/L (ref 19–32)
Calcium: 10 mg/dL (ref 8.4–10.5)
Chloride: 98 mEq/L (ref 96–112)
Creatinine, Ser: 1.6 mg/dL — ABNORMAL HIGH (ref 0.40–1.50)
GFR: 43.93 mL/min — ABNORMAL LOW (ref >60.00)
Glucose, Bld: 75 mg/dL (ref 70–99)
Potassium: 3.3 mEq/L — ABNORMAL LOW (ref 3.5–5.1)
Sodium: 137 mEq/L (ref 135–145)

## 2020-07-07 LAB — CBC WITH DIFFERENTIAL/PLATELET
Basophils Absolute: 0 10*3/uL (ref 0.0–0.1)
Basophils Relative: 0.4 % (ref 0.0–3.0)
Eosinophils Absolute: 0.2 10*3/uL (ref 0.0–0.7)
Eosinophils Relative: 3.2 % (ref 0.0–5.0)
HCT: 52.1 % — ABNORMAL HIGH (ref 39.0–52.0)
Hemoglobin: 17 g/dL (ref 13.0–17.0)
Lymphocytes Relative: 43.2 % (ref 12.0–46.0)
Lymphs Abs: 3.1 10*3/uL (ref 0.7–4.0)
MCHC: 32.7 g/dL (ref 30.0–36.0)
MCV: 78.4 fl (ref 78.0–100.0)
Monocytes Absolute: 0.9 10*3/uL (ref 0.1–1.0)
Monocytes Relative: 12 % (ref 3.0–12.0)
Neutro Abs: 3 10*3/uL (ref 1.4–7.7)
Neutrophils Relative %: 41.2 % — ABNORMAL LOW (ref 43.0–77.0)
Platelets: 210 10*3/uL (ref 150.0–400.0)
RBC: 6.65 Mil/uL — ABNORMAL HIGH (ref 4.22–5.81)
RDW: 16 % — ABNORMAL HIGH (ref 11.5–15.5)
WBC: 7.2 10*3/uL (ref 4.0–10.5)

## 2020-07-07 LAB — HEPATIC FUNCTION PANEL
ALT: 21 U/L (ref 0–53)
AST: 25 U/L (ref 0–37)
Albumin: 4.5 g/dL (ref 3.5–5.2)
Alkaline Phosphatase: 100 U/L (ref 39–117)
Bilirubin, Direct: 0.1 mg/dL (ref 0.0–0.3)
Total Bilirubin: 0.5 mg/dL (ref 0.2–1.2)
Total Protein: 8.3 g/dL (ref 6.0–8.3)

## 2020-07-09 ENCOUNTER — Encounter: Payer: Self-pay | Admitting: Internal Medicine

## 2020-07-11 ENCOUNTER — Encounter: Payer: Self-pay | Admitting: Internal Medicine

## 2020-07-11 ENCOUNTER — Other Ambulatory Visit: Payer: Self-pay | Admitting: Internal Medicine

## 2020-07-11 DIAGNOSIS — I480 Paroxysmal atrial fibrillation: Secondary | ICD-10-CM

## 2020-07-11 HISTORY — DX: Paroxysmal atrial fibrillation: I48.0

## 2020-07-11 MED ORDER — POTASSIUM CHLORIDE ER 10 MEQ PO TBCR: 10.0000 meq | EXTENDED_RELEASE_TABLET | Freq: Every day | ORAL | 0 refills | Status: DC

## 2020-07-11 NOTE — Assessment & Plan Note (Signed)
With near resolved left hand weakness, cont same tx,  to f/u any worsening symptoms or concerns

## 2020-07-11 NOTE — Assessment & Plan Note (Signed)
Ok to resume candesartan-hct, cont all other meds and f/u BP at home

## 2020-07-11 NOTE — Assessment & Plan Note (Signed)
stable overall by history and exam, recent data reviewed with pt, and pt to continue medical treatment as before,  to f/u any worsening symptoms or concerns  

## 2020-07-11 NOTE — Assessment & Plan Note (Signed)
For f/u lab, avoid nephrotixins

## 2020-07-11 NOTE — Assessment & Plan Note (Signed)
Cont statin, stable overall by history and exam, recent data reviewed with pt, and pt to continue medical treatment as before,  to f/u any worsening symptoms or concerns

## 2020-07-12 ENCOUNTER — Ambulatory Visit: Payer: Medicare Other | Admitting: Student

## 2020-07-12 ENCOUNTER — Encounter: Payer: Self-pay | Admitting: Student

## 2020-07-12 ENCOUNTER — Other Ambulatory Visit: Payer: Self-pay

## 2020-07-12 VITALS — BP 138/72 | HR 61 | Resp 16 | Ht 71.0 in | Wt 251.8 lb

## 2020-07-12 DIAGNOSIS — I10 Essential (primary) hypertension: Secondary | ICD-10-CM | POA: Diagnosis not present

## 2020-07-12 DIAGNOSIS — Z8673 Personal history of transient ischemic attack (TIA), and cerebral infarction without residual deficits: Secondary | ICD-10-CM

## 2020-07-12 DIAGNOSIS — I480 Paroxysmal atrial fibrillation: Secondary | ICD-10-CM | POA: Diagnosis not present

## 2020-07-12 DIAGNOSIS — E7800 Pure hypercholesterolemia, unspecified: Secondary | ICD-10-CM | POA: Diagnosis not present

## 2020-07-12 DIAGNOSIS — E118 Type 2 diabetes mellitus with unspecified complications: Secondary | ICD-10-CM

## 2020-07-12 DIAGNOSIS — N1831 Chronic kidney disease, stage 3a: Secondary | ICD-10-CM | POA: Diagnosis not present

## 2020-07-12 NOTE — Progress Notes (Addendum)
Primary Physician/Referring:  Corwin Levins, MD  Patient ID: Roberto Rivera, male    DOB: [birth year], 69 y.o.   MRN: [id number]  Chief Complaint  Patient presents with  . New Patient (Initial Visit)  . Atrial Fibrillation   HPI:    Roberto Rivera  is a 69 y.o. AA male with history of type 2 diabetes, stage III chronic kidney disease, hypertension, hyperlipidemia, and tobacco use (smokes pack/day), and paroxysmal atrial fibrillation. He presented to [facility] ED 06/30/2020 with complaints of left hand numbness and weakness. He was diagnosed with small acute cerebral infarct. Cardiac monitoring revealed atrial fibrillation as the likely etiology. He now presents for follow up.   Patient is presently doing well, without known recurrence of atrial fibrillation.  Denies chest pain, shortness of breath, leg swelling, symptoms suggestive of CVA or TIA.  Patient has been monitoring his blood pressures at home and brings with him a log.  He did go to the emergency department on 07/04/2020 with asymptomatic hypertension.  At this time patient restarted candesartan/HCTZ and his blood pressure control has since improved.  Patient reports struggling with heart healthy diet restrictions he is continue to eat some some high salt foods but is focusing on weight loss.   Consult HPI 07/02/2020:  Roberto Rivera  is a 69 y.o. AA male with history of type 2 diabetes, stage III chronic kidney disease, hypertension, hyperlipidemia, and tobacco use (smokes pack/day).  He presented to the emergency department 06/30/2020 with complaint of left hand numbness and weakness.  He reports that over the last 2-3 weeks he has noted occasional paresthesias around his left ear and down his left arm.  He also reports occasional episodes of palpitations lasting several minutes.  On 06/30/2020 he noted left arm weakness which prompted him to seek evaluation in the emergency department.  Upon evaluation in the emergency department MRI revealed small  acute infarct at the base of the right precentral gyrus and right occipital cortex.   Echocardiogram showed normal ejection fraction with severe dilatation of left atrium and mild dilation of ascending aorta. Carotid ultrasound showed no significant extracranial stenosis.  Cardiac monitoring 07/01/2020 revealed patient in atrial fibrillation with RVR. Cardiology was consulted for atrial fibrillation with RVR management.  He was started on Lopressor 25 mg 3 times daily as well as diltiazem and Eliquis.   Patient is presently doing well with resolution of left arm weakness and no recurrence of palpitations.  Past Medical History:  Diagnosis Date  . ALLERGIC RHINITIS 04/01/2007  . Diabetes mellitus   . DIABETES MELLITUS, TYPE II 04/01/2007  . 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 Hypercholesteremia   . HYPERCHOLESTEROLEMIA 02/07/2008  . Hypertension   . HYPERTENSION 04/01/2007  . MICROCYTOSIS 02/07/2008  . OSTEOARTHRITIS, SPINE 02/07/2008  . PAF (paroxysmal atrial fibrillation) (HCC) 07/11/2020  . Stroke (HCC)   . THYROID CYST 02/07/2008   History reviewed. No pertinent surgical history. Family History  Problem Relation Age of Onset  . Cancer Mother        had uncertain type of cancer  . Stroke Sister     Social History   Tobacco Use  . Smoking status: Former Smoker    Packs/day: 1.00    Types: Cigarettes    Quit date: 06/23/2020    Years since quitting: 0.0  . Smokeless tobacco: Never Used  Substance Use Topics  . Alcohol use: No    Alcohol/week: 0.0 standard drinks   Marital Status: Married   ROS  Review of Systems  Constitutional: Negative for malaise/fatigue and weight gain.  Cardiovascular: Negative for chest pain, claudication, leg swelling, near-syncope, orthopnea, palpitations, paroxysmal nocturnal dyspnea and syncope.  Respiratory: Negative for shortness of breath.   Hematologic/Lymphatic: Does not bruise/bleed easily.  Gastrointestinal: Negative for melena.   Neurological: Negative for dizziness and weakness.    Objective  Blood pressure 138/72, pulse 61, resp. rate 16, height 5\' 11"  (1.803 m), weight 251 lb 12.8 oz (114.2 kg), SpO2 92 %.  Vitals with BMI 07/12/2020 07/06/2020 07/04/2020  Height 5\' 11"  5\' 11"  -  Weight 251 lbs 13 oz 252 lbs -  BMI 35.13 35.16 -  Systolic 138 150 448140  Diastolic 72 80 100  Pulse 61 64 65      Physical Exam Vitals reviewed.  HENT:     Head: Normocephalic and atraumatic.  Cardiovascular:     Rate and Rhythm: Normal rate and regular rhythm.     Pulses: Intact distal pulses.     Heart sounds: S1 normal and S2 normal. No murmur heard.  No gallop.      Comments: No JVD. Minimal bilateral lower leg edema bilaterally.  Pulmonary:     Effort: Pulmonary effort is normal. No respiratory distress.     Breath sounds: No wheezing, rhonchi or rales.  Musculoskeletal:     Right lower leg: No edema.     Left lower leg: No edema.  Neurological:     Mental Status: He is alert.    Laboratory examination:   Recent Labs    06/30/20 1456 06/30/20 1527 07/01/20 0314 07/04/20 1037 07/06/20 1614  NA 135   < > 138 137 137  K 3.5   < > 3.1* 3.5 3.3*  CL 98   < > 104 104 98  CO2 24   < > 25 23 29   GLUCOSE 150*   < > 121* 84 75  BUN 20   < > 16 16 21   CREATININE 1.74*   < > 1.52* 1.40* 1.60*  CALCIUM 9.6   < > 8.9 9.0 10.0  GFRNONAA 42*  --  50* 55*  --    < > = values in this interval not displayed.   estimated creatinine clearance is 56.8 mL/min (A) (by C-G formula based on SCr of 1.6 mg/dL (H)).  CMP Latest Ref Rng & Units 07/06/2020 07/04/2020 07/01/2020  Glucose 70 - 99 mg/dL 75 84 185(U121(H)  BUN 6 - 23 mg/dL 21 16 16   Creatinine 0.40 - 1.50 mg/dL 3.14(H1.60(H) 7.02(O1.40(H) 3.78(H1.52(H)  Sodium 135 - 145 mEq/L 137 137 138  Potassium 3.5 - 5.1 mEq/L 3.3(L) 3.5 3.1(L)  Chloride 96 - 112 mEq/L 98 104 104  CO2 19 - 32 mEq/L 29 23 25   Calcium 8.4 - 10.5 mg/dL 88.510.0 9.0 8.9  Total Protein 6.0 - 8.3 g/dL 8.3 - -  Total  Bilirubin 0.2 - 1.2 mg/dL 0.5 - -  Alkaline Phos 39 - 117 U/L 100 - -  AST 0 - 37 U/L 25 - -  ALT 0 - 53 U/L 21 - -   CBC Latest Ref Rng & Units 07/06/2020 07/04/2020 07/01/2020  WBC 4.0 - 10.5 K/uL 7.2 6.9 9.2  Hemoglobin 13.0 - 17.0 g/dL 02.717.0 74.115.2 28.713.8  Hematocrit 39 - 52 % 52.1(H) 48.5 42.9  Platelets 150 - 400 K/uL 210.0 210 209    Lipid Panel Recent Labs    01/28/20 0956 07/01/20 0314  CHOL 95 79  TRIG 65.0 72  LDLCALC 51  39  VLDL 13.0 14  HDL 31.40* 26*  CHOLHDL 3 3.0    HEMOGLOBIN A1C Lab Results  Component Value Date   HGBA1C 7.3 (H) 06/30/2020   MPG 162.81 06/30/2020   TSH Recent Labs    01/28/20 0956  TSH 1.64    External labs:  None   Medications and allergies  No Known Allergies   Outpatient Medications Prior to Visit  Medication Sig Dispense Refill  . Accu-Chek FastClix Lancets MISC USE TO CHECK BLOOD SUGAR AS DIRECTED ONCE DAILY 102 each 2  . ACCU-CHEK GUIDE test strip USE AS DIRECTED TO TEST BLOOD SUGAR ONCE DAILY 100 strip 0  . apixaban (ELIQUIS) 5 MG TABS tablet Take 1 tablet (5 mg total) by mouth 2 (two) times daily. 60 tablet 1  . benzonatate (TESSALON) 100 MG capsule Take 1 capsule (100 mg total) by mouth every 8 (eight) hours. (Patient taking differently: Take 100 mg by mouth as needed. ) 21 capsule 0  . Candesartan Cilexetil-HCTZ 32-25 MG TABS Take by mouth.    . diltiazem (CARDIZEM CD) 120 MG 24 hr capsule Take 1 capsule (120 mg total) by mouth daily. 30 capsule 11  . metFORMIN (GLUCOPHAGE-XR) 500 MG 24 hr tablet TAKE 1 TABLET(500 MG) BY MOUTH DAILY WITH BREAKFAST 90 tablet 1  . metoprolol tartrate (LOPRESSOR) 50 MG tablet Take 1 tablet (50 mg total) by mouth 2 (two) times daily. 60 tablet 1  . potassium chloride (KLOR-CON 10) 10 MEQ tablet Take 1 tablet (10 mEq total) by mouth daily for 5 days. 5 tablet 0  . repaglinide (PRANDIN) 1 MG tablet Take 1 tablet (1 mg total) by mouth 2 (two) times daily before a meal. 180 tablet 3  .  rosuvastatin (CRESTOR) 5 MG tablet 1 tab by mouth once daily (Patient taking differently: Take 5 mg by mouth daily. ) 90 tablet 3  . tadalafil (CIALIS) 20 MG tablet Take 20 mg by mouth daily as needed for erectile dysfunction.    . Blood Glucose Monitoring Suppl (ACCU-CHEK AVIVA) device Use to monitor glucose levels daily; E11.21 1 each 0   No facility-administered medications prior to visit.     Radiology:   No results found.  Cardiac Studies:   Venous Bilateral Ultrasound 07/02/2020: RIGHT:  - There is no evidence of deep vein thrombosis in the lower extremity.  - No cystic structure found in the popliteal fossa.  LEFT:  - There is no evidence of deep vein thrombosis in the lower extremity.  - No cystic structure found in the popliteal fossa.  ECHOCARDIOGRAM COMPLETE 07/01/2020 1. Left ventricular ejection fraction, by estimation, is 60 to 65%. The left ventricle has normal function. The left ventricle has no regional wall motion abnormalities. There is mild left ventricular hypertrophy. Left ventricular diastolic parameters are consistent with Grade I diastolic dysfunction (impaired relaxation).   2. Right ventricular systolic function is normal. The right ventricular size is normal. Tricuspid regurgitation signal is inadequate for assessing PA pressure.   3. Left atrial size was severely dilated.   4. The mitral valve is normal in structure. No evidence of mitral valve regurgitation. No evidence of mitral stenosis. There was chordal systolic anterior motion noted (not valvular).  5. The aortic valve is tricuspid. Aortic valve regurgitation is mild. Mild aortic valve stenosis. Aortic valve mean gradient measures 14.0 mmHg.   6. Aortic dilatation noted. There is mild dilatation of the ascending aorta, measuring 40 mm.  7. The inferior vena cava is normal in  size with greater than 50% respiratory variability, suggesting right atrial pressure of 3 mmHg.  VAS US CAROTID 07/01/2020 Right  Carotid: Velocities in the right ICA are consistent with a 1-39% stenosis.  Left Carotid: Velocities in the left ICA are consistent with a 1-39% stenosis.  Vertebrals:  Bilateral vertebral arteries demonstrate antegrade flow.  Subclavians: Normal flow hemodynamics were seen in bilateral subclavian arteries.   EKG:   07/12/2020: sinus bradycardia at a rate of 57 bpm with borderline 1st degree AV block, left atrial enlargement. Normal axis.  Nonspecific T wave abnormality.  07/02/2020: Sinus rhythm at a rate of 72 bpm, normal axis.  T wave inversions lateral leads, cannot exclude ischemia.  07/01/2020: Atrial fibrillation with rapid ventricular response at a rate of 115 bpm.  Normal axis.  Assessment     ICD-10-CM   1. PAF (paroxysmal atrial fibrillation) (HCC)  I48.0 EKG 12-Lead    PCV MYOCARDIAL PERFUSION WO LEXISCAN  2. Essential hypertension  I10 PCV MYOCARDIAL PERFUSION WO LEXISCAN  3. Stage 3a chronic kidney disease (HCC)  N18.31   4. History of cerebrovascular accident (CVA) due to embolism  Z86.73   5. Hypercholesteremia  E78.00 PCV MYOCARDIAL PERFUSION WO LEXISCAN  6. Type 2 diabetes mellitus with complication, without long-term current use of insulin (HCC)  E11.8      There are no discontinued medications.  No orders of the defined types were placed in this encounter.  This patients CHA2DS2-VASc Score 5 (HTN, DM, Age, Stroke) and yearly risk of stroke 7.2%.   Recommendations:   Roberto Rivera is a 69 y.o. . AA male with history of type 2 diabetes, stage III chronic kidney disease, hypertension, hyperlipidemia, and tobacco use, and paroxysmal atrial fibrillation.  Patient presents for follow-up of new onset paroxysmal atrial fibrillation which is the likely etiology of his recent stroke.  He is presently doing well without symptoms including shortness of breath, orthopnea, PND, palpitations.  He has had no known recurrence of atrial fibrillation since leaving the hospital.  He is  presently tolerating oral anticoagulation well without bleeding diathesis.  We will continue Eliquis as his CHA2DS2-VASc score is 5.  He is presently remaining sinus rhythm with a well-controlled rate.  We will continue diltiazem, Lopressor.  Patient does need ischemic work-up regarding potential etiology of atrial fibrillation, will obtain nuclear stress test.  Also patient does report he may snore, patient may benefit from evaluation of sleep apnea in the future.  Of note patient does have multiple risk factors for atrial fibrillation recurrence including hypertension, obesity.  Patient brings with him today home blood pressure readings which have significantly improved since restarting candesartan/HCT.  Systolic blood pressure remains in the 130s, and therefore slightly above goal.  Discussed with patient regarding management options including diet and lifestyle modification with weight loss or adjustments to antihypertensive medications.  Patient prefers at this time to attempt diet and lifestyle modifications as well as weight loss prior to making changes to antihypertensive medications.  Discussed with patient the risks of hypertension, he verbalized understanding.  Patient will continue to monitor his blood pressure on a regular basis at home and notify the office if pressures consistently remain >130 systolic.  At the present time we will continue candesartan/HCT, metoprolol, diltiazem.  Lipids are under excellent control, will continue rosuvastatin.  In regard to diabetes is presently well controlled as last A1c was 7.3%, will defer further management to PCP.  Patient reports he has stopped smoking since his admission to the hospital.  Encouraged him to continue to avoid tobacco use as it increases his cardiovascular risk factors, patient agreed.  During this visit I reviewed and updated: Tobacco history  allergies medication reconciliation  medical history  surgical history  family history   social history.  This note was created using a voice recognition software as a result there may be grammatical errors inadvertently enclosed that do not reflect the nature of this encounter. Every attempt is made to correct such errors.  Addendum: follow up in 3 months, or sooner if needed.    Roberto Halsted, PA-C 07/12/2020, 4:27 PM Office: [phone]

## 2020-07-14 MED ORDER — BENZONATATE 100 MG PO CAPS
100.0000 mg | ORAL_CAPSULE | Freq: Three times a day (TID) | ORAL | 0 refills | Status: DC
Start: 2020-07-14 — End: 2020-08-11

## 2020-07-14 NOTE — Addendum Note (Signed)
Addended by: Corwin Levins on: 07/14/2020 05:11 PM   Modules accepted: Orders

## 2020-07-19 ENCOUNTER — Other Ambulatory Visit: Payer: Self-pay | Admitting: *Deleted

## 2020-07-19 NOTE — Patient Outreach (Signed)
[facility]) Care Management  07/19/2020  Roberto Rivera 1951/05/11 [id number]   EMMI Red Alert   Date :07/16/20 Day : 13 Reason :Problem refilling medication  Insurance: [employer]   Patient with Hospital admission at [facility] 11/10-11/12 , Dx: CVA Atrial fib. PMHX significant for Hyperlipidemia, hypertension .   Outreach #1 Subjective: Successful outreach call to patient , explained reason for the call and automated EMMI outreach calls. Patient voiced understanding of receiving calls. He stated he wished that he could have explained what he meant by the  response.  Patient discussed hospital admission for CVA atrial fib and being started on Eliquis, he discussed wanting to continue on this medication versus other medications discussed with him.  He reports refill of medication was $147 and he wasn't expecting that price. He reports that he has current medication and taking as prescribed. HE states that when he attended Cardiology appointment on 11/22 they discussed trying to help him with getting assistance with medication. He states they have given him information and papers to complete and return to office, reinforced patient to complete requested information and turn in at office. Reinforced with patient importance of taking medication as prescribed and getting refill filled prior to last dose. He reports receiving copay card at discharge stated pharmacy did not accept he reports it being due to his insurance plan.  Discussed patient if he would be able to get prescription refilled at time due reports that he will make it work, reinforced notifying MD office if concerns.  Discussed [facility] pharmacy referral to help with follow up on cost concerns related to medication cost, patient is agreeable to any support.   Social Patient reports living at home with his wife, he reports being independent with care. Denies transportation concerns.   Conditions  Patient discussed  recent stroke, tolerating usual activity at home denies new concerns for stroke symptoms reviewed with action plan.  Patient discussed history of hypertension that he monitors blood pressure at  home and working on limiting salt.  Patient report diagnosis of atrial fib denies symptom of palpitation fast beat shortness of breath.  Patient has attended post discharge visit with PCP, Cardiology and has neurology appointment 08/11/20.    Discussed referral to pharmacy regarding cost of Eliquis, patient is agreeable.  Patient denies any other concerns at this time.   Plan Will place pharmacy referral for cost concerns related to Eliquis Will send successful outreach letter.     Egbert Garibaldi, RN, BSN  [facility] Care Management,Care Management Coordinator  [phone]- Mobile [phone]- Toll Free Main Office

## 2020-07-20 NOTE — Patient Outreach (Signed)
Referral from Egbert Garibaldi, RN to [email or web address] for medication assistance.

## 2020-08-04 ENCOUNTER — Other Ambulatory Visit: Payer: Self-pay

## 2020-08-04 ENCOUNTER — Ambulatory Visit: Payer: Medicare Other

## 2020-08-04 DIAGNOSIS — I10 Essential (primary) hypertension: Secondary | ICD-10-CM

## 2020-08-04 DIAGNOSIS — E7800 Pure hypercholesterolemia, unspecified: Secondary | ICD-10-CM

## 2020-08-04 DIAGNOSIS — I480 Paroxysmal atrial fibrillation: Secondary | ICD-10-CM

## 2020-08-09 NOTE — Progress Notes (Signed)
Spoke to patient regarding his echo he is going to call back to let us know his schedule so we can schedule him

## 2020-08-09 NOTE — Progress Notes (Signed)
Please inform patient his stress test was abnormal. Will discuss further in the office. Please schedule him for earlier appt, preferably in the next 1-2 weeks.

## 2020-08-11 ENCOUNTER — Encounter: Payer: Self-pay | Admitting: Adult Health

## 2020-08-11 ENCOUNTER — Ambulatory Visit: Payer: Medicare Other | Admitting: Adult Health

## 2020-08-11 VITALS — BP 145/72 | HR 59 | Ht 71.0 in | Wt 245.0 lb

## 2020-08-11 DIAGNOSIS — I10 Essential (primary) hypertension: Secondary | ICD-10-CM

## 2020-08-11 DIAGNOSIS — I480 Paroxysmal atrial fibrillation: Secondary | ICD-10-CM | POA: Diagnosis not present

## 2020-08-11 DIAGNOSIS — E785 Hyperlipidemia, unspecified: Secondary | ICD-10-CM | POA: Diagnosis not present

## 2020-08-11 DIAGNOSIS — I639 Cerebral infarction, unspecified: Secondary | ICD-10-CM

## 2020-08-11 DIAGNOSIS — Z9189 Other specified personal risk factors, not elsewhere classified: Secondary | ICD-10-CM

## 2020-08-11 DIAGNOSIS — E1165 Type 2 diabetes mellitus with hyperglycemia: Secondary | ICD-10-CM

## 2020-08-11 NOTE — Progress Notes (Signed)
[facility] [address] [place]. [place] [address] [phone]       HOSPITAL FOLLOW UP NOTE  [name] Date of Birth:  Nov 22, 1950 Medical Record Number:  [id number]   Reason for Referral:  hospital stroke follow up    SUBJECTIVE:   CHIEF COMPLAINT:  Chief Complaint  Patient presents with  . Hospitalization Follow-up    Tx rm, alone, pt states he is doing well,     HPI:   [name] is a 69 y.o. male with history of DM2, HTN, HLD, CKD III, OA, smoking  who presented on 06/30/2020 with recurrent episode of L sided clumsiness and tingling.    Personally reviewed hospitalization pertinent progress notes, lab work and imaging with summary provided.  Evaluated by Dr. Pearlean Brownie with stroke work-up revealing right MCA and right occipital infarcts felt to be embolic secondary to unknown source although cannot rule out large vessel disease given right M2 stenosis.  Recommended TEE and if negative placement of loop recorder but prior to procedure, evidence of new onset A. fib noted on telemetry.  CHA2DS2-VASc score at least 5 therefore Eliquis initiated.  HTN stable.  LDL 39 and recommended continuation of home dose Crestor 5 mg daily.  Uncontrolled DM with A1c 7.3.  Other stroke risk factors include advanced age, tobacco use, obesity and likely OSA recommending OP evaluation.  Evaluated by therapies and discharged home in stable condition without therapy needs.  Stroke:   R MCA and R occipital infarcts felt to be embolic secondary to unknown source though cannot rule out large vessel disease given R M2 stenosis   CT head No acute abnormality. Small vessel disease. Atrophy. Sinus dz.  MRI  Small R precentral gyrus infarct. Small R occipital cortex infarct. Small vessel disease. Atrophy.   MRA  Severe distal R M2 stenosis   Carotid Doppler  B ICA 1-39% stenosis, VAs antegrade   2D Echo EF 60-65%. No source of embolus. LA dilated  Canceled TEE and loop - found A.  fib on telemetry  LDL 39  HgbA1c 7.3  No antithrombotic prior to admission, now on aspirin 325 mg daily and clopidogrel 75 mg daily. Changed to Eliquis given new diagnosis of atrial fibrillation.    Therapy recommendations:  No therapy needs  Disposition:  Return home  Today, 08/11/2020, [name] is being seen for hospital follow-up unaccompanied.  He has been doing well since discharge and denies residual deficits as well as new recurrent stroke/TIA symptoms. He has returned back to all activities he was doing prior without difficulties. He has remained on Eliquis 5 mg twice daily without bleeding or bruising.  Remains on Crestor 5 mg daily without myalgias.  Blood pressure today 145/72.  Monitored at home and has been elevated - he questions accuracy of home cuff and brought to todays visit to compare. Glucose levels stable with recent 30 day average 103. He reports complete tobacco sensation. He does report insomnia, day time fatigue, snoring. Denies witnessed apneas.  He has not previously underwent sleep study.  He does report recently having stress test with abnormal results and has follow-up with cardiology on 08/25/2020 to further discuss.  No further concerns at this time.    ROS:   14 system review of systems performed and negative with exception of those listed in HPI  PMH:  Past Medical History:  Diagnosis Date  . ALLERGIC RHINITIS 04/01/2007  . Diabetes mellitus   . DIABETES MELLITUS, TYPE II 04/01/2007  .  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 Hypercholesteremia   . HYPERCHOLESTEROLEMIA 02/07/2008  . Hypertension   . HYPERTENSION 04/01/2007  . MICROCYTOSIS 02/07/2008  . OSTEOARTHRITIS, SPINE 02/07/2008  . PAF (paroxysmal atrial fibrillation) (HCC) 07/11/2020  . Stroke (HCC)   . THYROID CYST 02/07/2008    PSH: No past surgical history on file.  Social History:  Social History   Socioeconomic History  . Marital status: Married    Spouse name: Not on file  . Number  of children: 2  . Years of education: Not on file  . Highest education level: Not on file  Occupational History  . 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 Smoking status: Former Smoker    Packs/day: 1.00    Types: Cigarettes    Quit date: 06/23/2020    Years since quitting: 0.1  . Smokeless tobacco: Never Used  Vaping Use  . Vaping Use: Never used  Substance and Sexual Activity  . Alcohol use: No    Alcohol/week: 0.0 standard drinks  . Drug use: No  . Sexual activity: Not on file  Other Topics Concern  . Not on file  Social History Narrative   Divorced x many years   Social Determinants of Health   Financial Resource Strain: Not on file  Food Insecurity: Not on file  Transportation Needs: Not on file  Physical Activity: Not on file  Stress: Not on file  Social Connections: Not on file  Intimate Partner Violence: Not on file    Family History:  Family History  Problem Relation Age of Onset  . Cancer Mother        had uncertain type of cancer  . Stroke Sister     Medications:   Current Outpatient Medications on File Prior to Visit  Medication Sig Dispense Refill  . Accu-Chek FastClix Lancets MISC USE TO CHECK BLOOD SUGAR AS DIRECTED ONCE DAILY 102 each 2  . ACCU-CHEK GUIDE test strip USE AS DIRECTED TO TEST BLOOD SUGAR ONCE DAILY 100 strip 0  . apixaban (ELIQUIS) 5 MG TABS tablet Take 1 tablet (5 mg total) by mouth 2 (two) times daily. 60 tablet 1  . Candesartan Cilexetil-HCTZ 32-25 MG TABS Take by mouth.    . diltiazem (CARDIZEM CD) 120 MG 24 hr capsule Take 1 capsule (120 mg total) by mouth daily. 30 capsule 11  . metFORMIN (GLUCOPHAGE-XR) 500 MG 24 hr tablet TAKE 1 TABLET(500 MG) BY MOUTH DAILY WITH BREAKFAST 90 tablet 1  . metoprolol tartrate (LOPRESSOR) 50 MG tablet Take 1 tablet (50 mg total) by mouth 2 (two) times daily. 60 tablet 1  . repaglinide (PRANDIN) 1 MG tablet Take 1 tablet (1 mg total) by mouth 2 (two) times daily before a  meal. 180 tablet 3  . rosuvastatin (CRESTOR) 5 MG tablet 1 tab by mouth once daily (Patient taking differently: Take 5 mg by mouth daily.) 90 tablet 3  . tadalafil (CIALIS) 20 MG tablet Take 20 mg by mouth daily as needed for erectile dysfunction.    . Blood Glucose Monitoring Suppl (ACCU-CHEK AVIVA) device Use to monitor glucose levels daily; E11.21 1 each 0   No current facility-administered medications on file prior to visit.    Allergies:  No Known Allergies    OBJECTIVE:  Physical Exam  Vitals:   08/11/20 1010  BP: (!) 145/72  Pulse: (!) 59  Weight: 245 lb (111.1 kg)  Height: 5\' 11"  (1.803 m)   Body mass index is  34.17 kg/m. No exam data present  Post stroke PHQ 2/9 Depression screen PHQ 2/9 08/11/2020  Decreased Interest 0  Down, Depressed, Hopeless 0  PHQ - 2 Score 0     General: well developed, well nourished,  very pleasant middle-aged African-American male, seated, in no evident distress Head: head normocephalic and atraumatic.   Neck: supple with no carotid or supraclavicular bruits Cardiovascular: regular rate and rhythm, no murmurs Musculoskeletal: no deformity Skin:  no rash/petichiae Vascular:  Normal pulses all extremities   Neurologic Exam Mental Status: Awake and fully alert.   Fluent speech and language.  Oriented to place and time. Recent and remote memory intact. Attention span, concentration and fund of knowledge appropriate. Mood and affect appropriate.  Cranial Nerves: Fundoscopic exam reveals sharp disc margins. Pupils equal, briskly reactive to light. Extraocular movements full without nystagmus. Visual fields full to confrontation. Hearing intact. Facial sensation intact. Face, tongue, palate moves normally and symmetrically.  Motor: Normal bulk and tone. Normal strength in all tested extremity muscles Sensory.: intact to touch , pinprick , position and vibratory sensation.  Coordination: Rapid alternating movements normal in all extremities.  Finger-to-nose and heel-to-shin performed accurately bilaterally. Gait and Station: Arises from chair without difficulty. Stance is normal. Gait demonstrates normal stride length and balance without use of assistive device. Reflexes: 1+ and symmetric. Toes downgoing.     NIHSS  0 Modified Rankin  0      ASSESSMENT: Roberto Rivera is a 69 y.o. year old male presented with recurrent episode of left-sided clumsiness and tingling on 06/30/2020 with stroke work-up revealing right MCA and right occipital infarcts likely secondary to embolic secondary to unknown source vs large vessel disease given right M2 stenosis but found new onset A. fib during admission on telemetry. Vascular risk factors include new onset A. fib, HTN, HLD, DM, intracranial stenosis, tobacco use, obesity and likely sleep apnea.      PLAN:  1. R MCA and occipital strokes:  a. Recovered well without residual deficits.   b. Continue Eliquis (apixaban) daily  and Crestor 5 mg daily for secondary stroke prevention.   c. Discussed secondary stroke prevention measures and importance of close PCP follow up for aggressive stroke risk factor management  2. Atrial fibrillation, new onset: CHA2DS2-VASc score of at least 5 for age, HTN, DM and stroke.  Remains on Eliquis 5 mg twice daily 3. At risk for sleep apnea: Referral placed to [facility] sleep clinic for possible underlying sleep apnea.  At increased risk with new diagnosis of A. fib, recent stroke, obesity and HTN 4. HTN: BP goal <130/90.  Stable on Cardizem, metoprolol, and candesartan cilexetil-HCTZ per PCP/cards. Checked home blood pressure cuff which was accurate with manual reading. Advised to continue to monitor at home with recordings  5. HLD: LDL goal <70. Recent LDL 39 on Crestor 5 mg daily per PCP.  6. DMII: A1c goal<7.0. Recent A1c 7.3. Stable per patient on metformin and prandin per PCP 7. Tobacco use: congratulated on complete tobacco cessation and highly encouraged continued  abstinence     Follow up in 4 months or call earlier if needed   CC:  [facility] provider: Dr. Jerolyn Center, [name], MD    I spent 45 minutes of face-to-face and non-face-to-face time with patient.  This included previsit chart review including hospitalization pertinent progress notes, lab work and imaging, lab review, study review, order entry, electronic health record documentation, patient education regarding recent stroke including etiology, residual deficits, importance of managing stroke  risk factors, possible sleep apnea and answered all other questions to patient satisfaction   Ihor AustinJessica McCue, [facility] Providence HospitalGNP-BC  [facility] [address] [place], [place] [id number]  Phone [phone] Fax [phone] Note: This document was prepared with digital dictation and possible smart phrase technology. Any transcriptional errors that result from this process are unintentional.

## 2020-08-11 NOTE — Patient Instructions (Signed)
Continue Eliquis (apixaban) daily  and Crestor 5 mg daily for secondary stroke   Continue to follow with cardiology for atrial fibrillation and eliquis management  Referral placed to [facility] sleep clinic to further evaluate for possible underlying sleep apnea  Continue to follow up with PCP regarding cholesterol, blood pressure and diabetes management  Maintain strict control of hypertension with blood pressure goal below 130/90, diabetes with hemoglobin A1c goal below 7.0 % and cholesterol with LDL cholesterol (bad cholesterol) goal below 70 mg/dL.       Followup in the future with me in 4 months or call earlier if needed       Thank you for coming to see [place] at [facility]. I hope we have been able to provide you high quality care today.  You may receive a patient satisfaction survey over the next few weeks. We would appreciate your feedback and comments so that we may continue to improve ourselves and the health of our patients.    Stroke Prevention Some medical conditions and lifestyle choices can lead to a higher risk for a stroke. You can help to prevent a stroke by making nutrition, lifestyle, and other changes. What nutrition changes can be made?   Eat healthy foods. ? Choose foods that are high in fiber. These include:  Fresh fruits.  Fresh vegetables.  Whole grains. ? Eat at least 5 or more servings of fruits and vegetables each day. Try to fill half of your plate at each meal with fruits and vegetables. ? Choose lean protein foods. These include:  Lowfat (lean) cuts of meat.  Chicken without skin.  Fish.  Tofu.  Beans.  Nuts. ? Eat low-fat dairy products. ? Avoid foods that:  Are high in salt (sodium).  Have saturated fat.  Have trans fat.  Have cholesterol.  Are processed.  Are premade.  Follow eating guidelines as told by your doctor. These may include: ? Reducing how many calories you eat and drink each day. ? Limiting  how much salt you eat or drink each day to 1,500 milligrams (mg). ? Using only healthy fats for cooking. These include:  Olive oil.  Canola oil.  Sunflower oil. ? Counting how many carbohydrates you eat and drink each day. What lifestyle changes can be made?  Try to stay at a healthy weight. Talk to your doctor about what a good weight is for you.  Get at least 30 minutes of moderate physical activity at least 5 days a week. This can include: ? Fast walking. ? Biking. ? Swimming.  Do not use any products that have nicotine or tobacco. This includes cigarettes and e-cigarettes. If you need help quitting, ask your doctor. Avoid being around tobacco smoke in general.  Limit how much alcohol you drink to no more than 1 drink a day for nonpregnant women and 2 drinks a day for men. One drink equals 12 oz of beer, 5 oz of wine, or 1 oz of hard liquor.  Do not use drugs.  Avoid taking birth control pills. Talk to your doctor about the risks of taking birth control pills if: ? You are over 50 years old. ? You smoke. ? You get migraines. ? You have had a blood clot. What other changes can be made?  Manage your cholesterol. ? It is important to eat a healthy diet. ? If your cholesterol cannot be managed through your diet, you may also need to take medicines. Take medicines as told by your doctor.  Manage your diabetes. ? It is important to eat a healthy diet and to exercise regularly. ? If your blood sugar cannot be managed through diet and exercise, you may need to take medicines. Take medicines as told by your doctor.  Control your high blood pressure (hypertension). ? Try to keep your blood pressure below 130/80. This can help lower your risk of stroke. ? It is important to eat a healthy diet and to exercise regularly. ? If your blood pressure cannot be managed through diet and exercise, you may need to take medicines. Take medicines as told by your doctor. ? Ask your doctor if  you should check your blood pressure at home. ? Have your blood pressure checked every year. Do this even if your blood pressure is normal.  Talk to your doctor about getting checked for a sleep disorder. Signs of this can include: ? Snoring a lot. ? Feeling very tired.  Take over-the-counter and prescription medicines only as told by your doctor. These may include aspirin or blood thinners (antiplatelets or anticoagulants).  Make sure that any other medical conditions you have are managed. Where to find more information  American Stroke Association: www.strokeassociation.org  National Stroke Association: www.stroke.org Get help right away if:  You have any symptoms of stroke. "BE FAST" is an easy way to remember the main warning signs: ? B - Balance. Signs are dizziness, sudden trouble walking, or loss of balance. ? E - Eyes. Signs are trouble seeing or a sudden change in how you see. ? F - Face. Signs are sudden weakness or loss of feeling of the face, or the face or eyelid drooping on one side. ? A - Arms. Signs are weakness or loss of feeling in an arm. This happens suddenly and usually on one side of the body. ? S - Speech. Signs are sudden trouble speaking, slurred speech, or trouble understanding what people say. ? T - Time. Time to call emergency services. Write down what time symptoms started.  You have other signs of stroke, such as: ? A sudden, very bad headache with no known cause. ? Feeling sick to your stomach (nausea). ? Throwing up (vomiting). ? Jerky movements you cannot control (seizure). These symptoms may represent a serious problem that is an emergency. Do not wait to see if the symptoms will go away. Get medical help right away. Call your local emergency services (911 in the [place] Do not drive yourself to the hospital. Summary  You can prevent a stroke by eating healthy, exercising, not smoking, drinking less alcohol, and treating other health problems, such as  diabetes, high blood pressure, or high cholesterol.  Do not use any products that contain nicotine or tobacco, such as cigarettes and e-cigarettes.  Get help right away if you have any signs or symptoms of a stroke. This information is not intended to replace advice given to you by your health care provider. Make sure you discuss any questions you have with your health care provider. Document Revised: 10/03/2018 Document Reviewed: 11/08/2016 Elsevier Patient Education  2020 Elsevier Inc.  Sleep Apnea Sleep apnea affects breathing during sleep. It causes breathing to stop for a short time or to become shallow. It can also increase the risk of:  Heart attack.  Stroke.  Being very overweight (obese).  Diabetes.  Heart failure.  Irregular heartbeat. The goal of treatment is to help you breathe normally again. What are the causes? There are three kinds of sleep apnea:  Obstructive sleep apnea.  This is caused by a blocked or collapsed airway.  Central sleep apnea. This happens when the brain does not send the right signals to the muscles that control breathing.  Mixed sleep apnea. This is a combination of obstructive and central sleep apnea. The most common cause of this condition is a collapsed or blocked airway. This can happen if:  Your throat muscles are too relaxed.  Your tongue and tonsils are too large.  You are overweight.  Your airway is too small. What increases the risk?  Being overweight.  Smoking.  Having a small airway.  Being older.  Being male.  Drinking alcohol.  Taking medicines to calm yourself (sedatives or tranquilizers).  Having family members with the condition. What are the signs or symptoms?  Trouble staying asleep.  Being sleepy or tired during the day.  Getting angry a lot.  Loud snoring.  Headaches in the morning.  Not being able to focus your mind (concentrate).  Forgetting things.  Less interest in sex.  Mood  swings.  Personality changes.  Feelings of sadness (depression).  Waking up a lot during the night to pee (urinate).  Dry mouth.  Sore throat. How is this diagnosed?  Your medical history.  A physical exam.  A test that is done when you are sleeping (sleep study). The test is most often done in a sleep lab but may also be done at home. How is this treated?   Sleeping on your side.  Using a medicine to get rid of mucus in your nose (decongestant).  Avoiding the use of alcohol, medicines to help you relax, or certain pain medicines (narcotics).  Losing weight, if needed.  Changing your diet.  Not smoking.  Using a machine to open your airway while you sleep, such as: ? An oral appliance. This is a mouthpiece that shifts your lower jaw forward. ? A CPAP device. This device blows air through a mask when you breathe out (exhale). ? An EPAP device. This has valves that you put in each nostril. ? A BPAP device. This device blows air through a mask when you breathe in (inhale) and breathe out.  Having surgery if other treatments do not work. It is important to get treatment for sleep apnea. Without treatment, it can lead to:  High blood pressure.  Coronary artery disease.  In men, not being able to have an erection (impotence).  Reduced thinking ability. Follow these instructions at home: Lifestyle  Make changes that your doctor recommends.  Eat a healthy diet.  Lose weight if needed.  Avoid alcohol, medicines to help you relax, and some pain medicines.  Do not use any products that contain nicotine or tobacco, such as cigarettes, e-cigarettes, and chewing tobacco. If you need help quitting, ask your doctor. General instructions  Take over-the-counter and prescription medicines only as told by your doctor.  If you were given a machine to use while you sleep, use it only as told by your doctor.  If you are having surgery, make sure to tell your doctor you  have sleep apnea. You may need to bring your device with you.  Keep all follow-up visits as told by your doctor. This is important. Contact a doctor if:  The machine that you were given to use during sleep bothers you or does not seem to be working.  You do not get better.  You get worse. Get help right away if:  Your chest hurts.  You have trouble breathing in enough  air.  You have an uncomfortable feeling in your back, arms, or stomach.  You have trouble talking.  One side of your body feels weak.  A part of your face is hanging down. These symptoms may be an emergency. Do not wait to see if the symptoms will go away. Get medical help right away. Call your local emergency services (911 in the [place] Do not drive yourself to the hospital. Summary  This condition affects breathing during sleep.  The most common cause is a collapsed or blocked airway.  The goal of treatment is to help you breathe normally while you sleep. This information is not intended to replace advice given to you by your health care provider. Make sure you discuss any questions you have with your health care provider. Document Revised: 05/24/2018 Document Reviewed: 04/02/2018 Elsevier Patient Education  [place].

## 2020-08-12 NOTE — Progress Notes (Signed)
I agree with the above plan 

## 2020-08-16 ENCOUNTER — Other Ambulatory Visit: Payer: Self-pay

## 2020-08-16 ENCOUNTER — Telehealth: Payer: Self-pay | Admitting: Internal Medicine

## 2020-08-16 MED ORDER — ROSUVASTATIN CALCIUM 5 MG PO TABS
ORAL_TABLET | ORAL | 3 refills | Status: DC
Start: 2020-08-16 — End: 2020-11-03

## 2020-08-16 MED ORDER — METOPROLOL TARTRATE 50 MG PO TABS
50.0000 mg | ORAL_TABLET | Freq: Two times a day (BID) | ORAL | 1 refills | Status: DC
Start: 2020-08-16 — End: 2020-10-15

## 2020-08-16 NOTE — Telephone Encounter (Signed)
Spoke with Roberto Rivera and was bale to inform him of the details on how to take his meds along getting Roberto Rivera an appointment to see Roberto Rivera for his high levels of BP readings.

## 2020-08-16 NOTE — Telephone Encounter (Signed)
    Patient calling to confirm instructions for metoprolol tartrate

## 2020-08-18 ENCOUNTER — Other Ambulatory Visit: Payer: Self-pay | Admitting: Endocrinology

## 2020-08-24 NOTE — Progress Notes (Signed)
Primary Physician/Referring:  Corwin Levins, MD  Patient ID: Roberto Rivera, male    DOB: [birth year], 70 y.o.   MRN: [id number]  Chief Complaint  Patient presents with   Atrial Fibrillation   Results   HPI:    Roberto Rivera  is a 70 y.o. AA male with history of type 2 diabetes, stage III chronic kidney disease, hypertension, hyperlipidemia, and tobacco use (smokes pack/day), and paroxysmal atrial fibrillation. He presented to [facility] ED 06/30/2020 with complaints of left hand numbness and weakness. He was diagnosed with small acute cerebral infarct. Cardiac monitoring revealed atrial fibrillation as the likely etiology. He now presents for follow up.   Patient presents for follow up due to markedly abnormal stress test. Patient was recommended to come in for an earlier visit due to abnormal stress test, but there were scheduling difficulties. He  States he presently feels well. Denies chest pain, shortness of breath, leg swelling, symptoms suggestive of CVA or TIA. He does note that his blood pressure has been elevated above goal on home readings.   Past Medical History:  Diagnosis Date   ALLERGIC RHINITIS 04/01/2007   Diabetes mellitus    DIABETES MELLITUS, TYPE II 04/01/2007   ERECTILE DYSFUNCTION, ORGANIC 02/07/2008   Hypercholesteremia    HYPERCHOLESTEROLEMIA 02/07/2008   Hypertension    HYPERTENSION 04/01/2007   MICROCYTOSIS 02/07/2008   OSTEOARTHRITIS, SPINE 02/07/2008   PAF (paroxysmal atrial fibrillation) (HCC) 07/11/2020   Stroke (HCC)    THYROID CYST 02/07/2008   History reviewed. No pertinent surgical history. Family History  Problem Relation Age of Onset   Cancer Mother        had uncertain type of cancer   Stroke Sister     Social History   Tobacco Use   Smoking status: Former Smoker    Packs/day: 1.00    Types: Cigarettes    Quit date: 06/23/2020    Years since quitting: 0.1   Smokeless tobacco: Never Used  Substance Use Topics   Alcohol use: No     Alcohol/week: 0.0 standard drinks   Marital Status: Married   ROS  Review of Systems  Constitutional: Negative for malaise/fatigue and weight gain.  Cardiovascular: Negative for chest pain, claudication, leg swelling, near-syncope, orthopnea, palpitations, paroxysmal nocturnal dyspnea and syncope.  Respiratory: Negative for shortness of breath.   Hematologic/Lymphatic: Does not bruise/bleed easily.  Gastrointestinal: Negative for melena.  Neurological: Negative for dizziness and weakness.    Objective  Blood pressure (!) 152/82, pulse (!) 57, height 5\' 11"  (1.803 m), weight 248 lb (112.5 kg), SpO2 98 %.  Vitals with BMI 08/25/2020 08/11/2020 07/12/2020  Height 5\' 11"  5\' 11"  5\' 11"   Weight 248 lbs 245 lbs 251 lbs 13 oz  BMI 34.6 34.19 35.13  Systolic 152 145 07/14/2020  Diastolic 82 72 72  Pulse 57 59 61      Physical Exam Vitals reviewed.  HENT:     Head: Normocephalic and atraumatic.  Cardiovascular:     Rate and Rhythm: Normal rate and regular rhythm.     Pulses: Intact distal pulses.     Heart sounds: S1 normal and S2 normal. No murmur heard. No gallop.      Comments: No JVD. Trace bilateral lower leg edema.  Pulmonary:     Effort: Pulmonary effort is normal. No respiratory distress.     Breath sounds: No wheezing, rhonchi or rales.  Musculoskeletal:     Right lower leg: No edema.     Left lower  leg: No edema.  Neurological:     Mental Status: He is alert.    Laboratory examination:   Recent Labs    06/30/20 1456 06/30/20 1527 07/01/20 0314 07/04/20 1037 07/06/20 1614  NA 135   < > 138 137 137  K 3.5   < > 3.1* 3.5 3.3*  CL 98   < > 104 104 98  CO2 24  --  25 23 29   GLUCOSE 150*   < > 121* 84 75  BUN 20   < > 16 16 21   CREATININE 1.74*   < > 1.52* 1.40* 1.60*  CALCIUM 9.6  --  8.9 9.0 10.0  GFRNONAA 42*  --  50* 55*  --    < > = values in this interval not displayed.   CrCl cannot be calculated (Patient's most recent lab result is older than the maximum 21  days allowed.).  CMP Latest Ref Rng & Units 07/06/2020 07/04/2020 07/01/2020  Glucose 70 - 99 mg/dL 75 84 07/06/2020)  BUN 6 - 23 mg/dL 21 16 16   Creatinine 0.40 - 1.50 mg/dL 13/06/2020) 440(H) )  Sodium 135 - 145 mEq/L 137 137 138  Potassium 3.5 - 5.1 mEq/L 3.3(L) 3.5 3.1(L)  Chloride 96 - 112 mEq/L 98 104 104  CO2 19 - 32 mEq/L 29 23 25   Calcium 8.4 - 10.5 mg/dL 4.74(Q 9.0 8.9  Total Protein 6.0 - 8.3 g/dL 8.3 - -  Total Bilirubin 0.2 - 1.2 mg/dL 0.5 - -  Alkaline Phos 39 - 117 U/L 100 - -  AST 0 - 37 U/L 25 - -  ALT 0 - 53 U/L 21 - -   CBC Latest Ref Rng & Units 07/06/2020 07/04/2020 07/01/2020  WBC 4.0 - 10.5 K/uL 7.2 6.9 9.2  Hemoglobin 13.0 - 17.0 g/dL 64.3 07/08/2020 07/06/2020  Hematocrit 39.0 - 52.0 % 52.1(H) 48.5 42.9  Platelets 150.0 - 400.0 K/uL 210.0 210 209    Lipid Panel Recent Labs    01/28/20 0956 07/01/20 0314  CHOL 95 79  TRIG 65.0 72  LDLCALC 51 39  VLDL 13.0 14  HDL 31.40* 26*  CHOLHDL 3 3.0    HEMOGLOBIN A1C Lab Results  Component Value Date   HGBA1C 7.3 (H) 06/30/2020   MPG 162.81 06/30/2020   TSH Recent Labs    01/28/20 0956  TSH 1.64    External labs:  None   Medications and allergies  No Known Allergies   Outpatient Medications Prior to Visit  Medication Sig Dispense Refill   Accu-Chek FastClix Lancets MISC USE TO CHECK BLOOD SUGAR AS DIRECTED ONCE DAILY 102 each 2   ACCU-CHEK GUIDE test strip USE AS DIRECTED TO TEST BLOOD SUGAR ONCE DAILY 100 strip 0   apixaban (ELIQUIS) 5 MG TABS tablet Take 1 tablet (5 mg total) by mouth 2 (two) times daily. 60 tablet 1   Candesartan Cilexetil-HCTZ 32-25 MG TABS Take by mouth.     diltiazem (CARDIZEM CD) 120 MG 24 hr capsule Take 1 capsule (120 mg total) by mouth daily. 30 capsule 11   metFORMIN (GLUCOPHAGE-XR) 500 MG 24 hr tablet TAKE 1 TABLET(500 MG) BY MOUTH DAILY WITH BREAKFAST 90 tablet 1   metoprolol tartrate (LOPRESSOR) 50 MG tablet Take 1 tablet (50 mg total) by mouth 2 (two) times daily. 60  tablet 1   repaglinide (PRANDIN) 1 MG tablet Take 1 tablet (1 mg total) by mouth 2 (two) times daily before a meal. 180 tablet 3   rosuvastatin (  CRESTOR) 5 MG tablet 1 tab by mouth once daily 90 tablet 3   tadalafil (CIALIS) 20 MG tablet Take 20 mg by mouth daily as needed for erectile dysfunction.     Blood Glucose Monitoring Suppl (ACCU-CHEK AVIVA) device Use to monitor glucose levels daily; E11.21 1 each 0   No facility-administered medications prior to visit.     Radiology:   No results found.  Cardiac Studies:   PCV MYOCARDIAL PERFUSION WO LEXISCAN 08/04/2020 Nondiagnostic ECG stress due to pharmacologic stress. Resting EKG demonstrated atrial fibrillation with rapid ventricular response. Non-specific ST-T abnormality. Peak EKG/ demonstrated atrial fibrillation with rapid ventricular response. There is a reversible moderate defect in the lateral and inferior regions. consistent with severe ischemia. Overall LV systolic function is abnormal with regional wall motion abnormalities in the same area.  Stress LV EF: 34%. No previous exam available for comparison. High risk study.  Venous Bilateral Ultrasound 07/02/2020: RIGHT:  - There is no evidence of deep vein thrombosis in the lower extremity.  - No cystic structure found in the popliteal fossa.  LEFT:  - There is no evidence of deep vein thrombosis in the lower extremity.  - No cystic structure found in the popliteal fossa.  ECHOCARDIOGRAM COMPLETE 07/01/2020 1. Left ventricular ejection fraction, by estimation, is 60 to 65%. The left ventricle has normal function. The left ventricle has no regional wall motion abnormalities. There is mild left ventricular hypertrophy. Left ventricular diastolic parameters are consistent with Grade I diastolic dysfunction (impaired relaxation).   2. Right ventricular systolic function is normal. The right ventricular size is normal. Tricuspid regurgitation signal is inadequate for assessing  PA pressure.   3. Left atrial size was severely dilated.   4. The mitral valve is normal in structure. No evidence of mitral valve regurgitation. No evidence of mitral stenosis. There was chordal systolic anterior motion noted (not valvular).  5. The aortic valve is tricuspid. Aortic valve regurgitation is mild. Mild aortic valve stenosis. Aortic valve mean gradient measures 14.0 mmHg.   6. Aortic dilatation noted. There is mild dilatation of the ascending aorta, measuring 40 mm.  7. The inferior vena cava is normal in size with greater than 50% respiratory variability, suggesting right atrial pressure of 3 mmHg.  VAS US CAROTID 07/01/2020 Right Carotid: Velocities in the right ICA are consistent with a 1-39% stenosis.  Left Carotid: Velocities in the left ICA are consistent with a 1-39% stenosis.  Vertebrals:  Bilateral vertebral arteries demonstrate antegrade flow.  Subclavians: Normal flow hemodynamics were seen in bilateral subclavian arteries.   EKG:   07/12/2020: sinus bradycardia at a rate of 57 bpm with borderline 1st degree AV block, left atrial enlargement. Normal axis.  Nonspecific T wave abnormality.  07/02/2020: Sinus rhythm at a rate of 72 bpm, normal axis.  T wave inversions lateral leads, cannot exclude ischemia.  07/01/2020: Atrial fibrillation with rapid ventricular response at a rate of 115 bpm.  Normal axis.  Assessment     ICD-10-CM   1. Coronary artery disease involving native coronary artery of native heart without angina pectoris  I25.10   2. PAF (paroxysmal atrial fibrillation) (HCC)  I48.0   3. Essential hypertension  I10   4. Abnormal stress test  R94.39      There are no discontinued medications.  Meds ordered this encounter  Medications   nitroGLYCERIN (NITROSTAT) 0.4 MG SL tablet    Sig: Place 1 tablet (0.4 mg total) under the tongue every 5 (five) minutes as needed  for chest pain.    Dispense:  90 tablet    Refill:  3   This patients CHA2DS2-VASc  Score 5 (HTN, DM, Age, Stroke) and yearly risk of stroke 7.2%.   Recommendations:   Roberto Rivera is a 70 y.o. . AA male with history of type 2 diabetes, stage III chronic kidney disease, hypertension, hyperlipidemia, and tobacco use, and paroxysmal atrial fibrillation.  Patient presents for follow-up of new onset paroxysmal atrial fibrillation which is the likely etiology of his recent stroke.  He is presently doing well without symptoms including shortness of breath, orthopnea, PND, palpitations.  He has had no known recurrence of atrial fibrillation since leaving the hospital.  He is presently tolerating oral anticoagulation well without bleeding diathesis.  We will continue Eliquis as his CHA2DS2-VASc score is 5.  He is presently remaining sinus rhythm with a well-controlled rate.  We will continue diltiazem, Lopressor.  Patient does need ischemic work-up regarding potential etiology of atrial fibrillation, will obtain nuclear stress test.  Also patient does report he may snore, patient may benefit from evaluation of sleep apnea in the future.  Of note patient does have multiple risk factors for atrial fibrillation recurrence including hypertension, obesity.  Patient presents for follow up of marked abnormal stress test and hypertension. Reviewed and discussed with patient results of nuclear stress testing which revealed reversible moderate defect in the lateral and inferior regions consistent with severe ischemia. Recommend patient undergo cardiac catheterization ideally as soon as possible. Heart catheterization procedure was explained to the patient in detail. The indication, alternatives, risks and benefits were reviewed. Complications including but not limited to bleeding, infection, acute kidney injury, blood transfusion, heart rhythm disturbances, contrast (dye) reaction, damage to the arteries or nerves in the legs or hands, cerebrovascular accident, myocardial infarction, need for emergent bypass  surgery, blood clots in the legs, possible need for emergent blood transfusion, and rarely death were reviewed and discussed with the patient. The patient voices understanding and wishes to discuss with his PCP and his family prior to going forward with the procedure. Advised patient that he is at high risk for myocardial infarction/injury, patient verbalized understanding. Advised him to let us know as soon as possible about his decision regarding cardiac catheterization. In the meantime, advised patient to avoid exerting himself. S/L NTG was prescribed and explained how to and when to use it and to notify us if there is change in frequency of use. Interaction with cialis-like agents was discussed.  In regard to hypertension, blood pressure is elevated above goal. Discussed option of making adjustments to patient's antihypertensive medications, however, patient is seeing his PCP in 1 week and prefers to wait for PCP's recommendations regarding blood pressure management. Advised patient I would recommend making adjustments to antihypertensives today, patient verbalized understanding and prefers to hold off. Counseled patient regarding signs and symptoms that would warrant patient be emergently evaluated in the ED, he verbalized understanding.   Follow up in 4 weeks.    This was a 45-minute encounter with face-to-face counseling, medical records review, coordination of care, explanation of complex medical issues, complex medical decision making, addressing all patient questions and concerns.     Rayford Halsted, PA-C 08/25/2020, 12:45 PM Office: [phone]

## 2020-08-25 ENCOUNTER — Other Ambulatory Visit: Payer: Self-pay

## 2020-08-25 ENCOUNTER — Encounter: Payer: Self-pay | Admitting: Student

## 2020-08-25 ENCOUNTER — Ambulatory Visit: Payer: Medicare Other | Admitting: Student

## 2020-08-25 VITALS — BP 152/82 | HR 57 | Ht 71.0 in | Wt 248.0 lb

## 2020-08-25 DIAGNOSIS — I2510 Atherosclerotic heart disease of native coronary artery without angina pectoris: Secondary | ICD-10-CM

## 2020-08-25 DIAGNOSIS — I10 Essential (primary) hypertension: Secondary | ICD-10-CM | POA: Diagnosis not present

## 2020-08-25 DIAGNOSIS — I480 Paroxysmal atrial fibrillation: Secondary | ICD-10-CM | POA: Diagnosis not present

## 2020-08-25 DIAGNOSIS — R9439 Abnormal result of other cardiovascular function study: Secondary | ICD-10-CM

## 2020-08-25 MED ORDER — NITROGLYCERIN 0.4 MG SL SUBL: 0.4000 mg | SUBLINGUAL_TABLET | SUBLINGUAL | 3 refills | Status: AC | PRN

## 2020-08-25 NOTE — Patient Instructions (Signed)
Coronary Angioplasty  Coronary angioplasty is a procedure to widen a narrowed or blocked blood vessel of the heart (coronary artery). The artery is usually blocked by cholesterol buildup (plaques) in the lining of the artery walls. When a vessel in the heart becomes partially blocked, there is decreased blood flow to that area. This may lead to chest pain or a heart attack (myocardial infarction). Tell a health care provider about:  Any allergies you have, including allergies to shellfish or contrast dye.  All medicines you are taking, including vitamins, herbs, eye drops, creams, and over-the-counter medicines.  Any problems you or family members have had with anesthetic medicines.  Any blood disorders you have.  Any surgeries you have had.  Any medical conditions you have.  Whether you are pregnant or may be pregnant. What are the risks? Generally, this is a safe procedure. However, problems may occur, including:  Damage to other structures or organs. This may include damage to blood vessels, leading to rupture or bleeding.  Infection, bleeding, or bruising at the site where a small, thin tube (catheter) will be inserted.  Allergic reaction to the dye or contrast that is used.  Kidney damage from the dye or contrast that is used.  Blood clots that can lead to a stroke or heart attack.  Bleeding into the abdomen (retroperitoneal bleeding). What happens before the procedure? Staying hydrated Follow instructions from your health care provider about hydration, which may include:  Up to 2 hours before the procedure - you may continue to drink clear liquids, such as water, clear fruit juice, black coffee, and plain tea. Eating and drinking restrictions Follow instructions from your health care provider about eating and drinking, which may include:  8 hours before the procedure - stop eating heavy meals or foods such as meat, fried foods, or fatty foods.  6 hours before the  procedure - stop eating light meals or foods, such as toast or cereal.  2 hours before the procedure - stop drinking clear liquids. Medicines  Ask your health care provider about: ? Changing or stopping your regular medicines. This is especially important if you are taking diabetes medicines or blood thinners. ? Whether aspirin is recommended before this procedure.  Ask your health care provider if you can take a sip of water with any approved medicines the morning of the procedure. General instructions  Plan to have someone take you home from the hospital or clinic.  If you will be going home right after the procedure, plan to have someone with you for 24 hours. What happens during the procedure?  To reduce your risk of infection: ? Your health care team will wash or sanitize their hands. ? A germ-killing solution (antiseptic) will be used to wash the area where the catheter will be inserted. Hair may be removed from this area. The catheter may be inserted in:  Your groin area. This is the most common area.  The fold of your arm, near your elbow.  Your wrist.  An IV tube will be inserted into one of your veins.  You will be given a medicine to help you relax (sedative).  You will be given a medicine to numb the area where the catheter will be inserted (local anesthetic).  The catheter will be inserted into an artery.  The catheter will be guided to the narrowed or blocked artery using a type of X-ray (fluoroscopy).  When the catheter is near the heart, dye will be injected that makes the  narrowing or blockage visible on the X-ray.  Once the catheter is positioned at the narrowed or blocked portion of the blood vessel, a balloon will be inflated to make the artery wider. Expanding the balloon will crush the plaques into the wall of the vessel and improve the blood flow.  The artery may be made wider by removing plaques using a drill, laser, or other tools.  When the blood  flow is better, the balloon will be deflated and the catheter will be removed.  A stent may be placed. This is common in this procedure.  After the catheter is removed, a special dressing will be placed over the insertion site. What happens after the procedure?  You will need to keep the area still for a few hours, or as long as directed by your health care provider. If the procedure was done in the groin, you will be instructed not to bend or cross your legs.  The insertion site will be checked often.  The pulse in your feet or wrist will be checked often.  Additional blood tests, X-rays, and an electrocardiogram (ECG) may be done.  Do not drive for 24 hours if you were given a sedative. This information is not intended to replace advice given to you by your health care provider. Make sure you discuss any questions you have with your health care provider. Document Revised: 07/20/2017 Document Reviewed: 05/05/2016 Elsevier Patient Education  2020 [employer].

## 2020-08-26 ENCOUNTER — Other Ambulatory Visit: Payer: Self-pay

## 2020-08-26 MED ORDER — APIXABAN 5 MG PO TABS
5.0000 mg | ORAL_TABLET | Freq: Two times a day (BID) | ORAL | 3 refills | Status: DC
Start: 2020-08-26 — End: 2020-10-13

## 2020-08-30 ENCOUNTER — Telehealth: Payer: Self-pay | Admitting: Student

## 2020-08-30 NOTE — Telephone Encounter (Signed)
Spoke to patient regarding reccommended heart catheterization in view of abnormal stress test. Patient still prefers to hold off on moving forward with cath until he talks to his PCP. Encouraged patient to let us know if he has additional questions. Again cautioned him regarding risks of not doing the cath, patient verbalized understanding.

## 2020-09-01 ENCOUNTER — Other Ambulatory Visit: Payer: Self-pay

## 2020-09-01 ENCOUNTER — Encounter: Payer: Self-pay | Admitting: Internal Medicine

## 2020-09-01 ENCOUNTER — Ambulatory Visit (INDEPENDENT_AMBULATORY_CARE_PROVIDER_SITE_OTHER): Payer: Medicare Other | Admitting: Internal Medicine

## 2020-09-01 VITALS — BP 168/72 | HR 56 | Temp 98.9°F | Wt 244.6 lb

## 2020-09-01 DIAGNOSIS — E785 Hyperlipidemia, unspecified: Secondary | ICD-10-CM | POA: Diagnosis not present

## 2020-09-01 DIAGNOSIS — E1169 Type 2 diabetes mellitus with other specified complication: Secondary | ICD-10-CM | POA: Diagnosis not present

## 2020-09-01 DIAGNOSIS — Z0001 Encounter for general adult medical examination with abnormal findings: Secondary | ICD-10-CM

## 2020-09-01 DIAGNOSIS — L609 Nail disorder, unspecified: Secondary | ICD-10-CM | POA: Diagnosis not present

## 2020-09-01 DIAGNOSIS — R9439 Abnormal result of other cardiovascular function study: Secondary | ICD-10-CM | POA: Diagnosis not present

## 2020-09-01 DIAGNOSIS — I480 Paroxysmal atrial fibrillation: Secondary | ICD-10-CM

## 2020-09-01 DIAGNOSIS — E559 Vitamin D deficiency, unspecified: Secondary | ICD-10-CM | POA: Diagnosis not present

## 2020-09-01 DIAGNOSIS — E1159 Type 2 diabetes mellitus with other circulatory complications: Secondary | ICD-10-CM | POA: Diagnosis not present

## 2020-09-01 DIAGNOSIS — E1121 Type 2 diabetes mellitus with diabetic nephropathy: Secondary | ICD-10-CM | POA: Diagnosis not present

## 2020-09-01 DIAGNOSIS — I152 Hypertension secondary to endocrine disorders: Secondary | ICD-10-CM | POA: Diagnosis not present

## 2020-09-01 DIAGNOSIS — Z0000 Encounter for general adult medical examination without abnormal findings: Secondary | ICD-10-CM

## 2020-09-01 DIAGNOSIS — N1831 Chronic kidney disease, stage 3a: Secondary | ICD-10-CM | POA: Diagnosis not present

## 2020-09-01 DIAGNOSIS — E538 Deficiency of other specified B group vitamins: Secondary | ICD-10-CM

## 2020-09-01 LAB — CBC WITH DIFFERENTIAL/PLATELET
Basophils Absolute: 0 10*3/uL (ref 0.0–0.1)
Basophils Relative: 0.3 % (ref 0.0–3.0)
Eosinophils Absolute: 0.2 10*3/uL (ref 0.0–0.7)
Eosinophils Relative: 1.6 % (ref 0.0–5.0)
HCT: 45.8 % (ref 39.0–52.0)
Hemoglobin: 15 g/dL (ref 13.0–17.0)
Lymphocytes Relative: 34.7 % (ref 12.0–46.0)
Lymphs Abs: 3.3 10*3/uL (ref 0.7–4.0)
MCHC: 32.8 g/dL (ref 30.0–36.0)
MCV: 77.7 fl — ABNORMAL LOW (ref 78.0–100.0)
Monocytes Absolute: 0.9 10*3/uL (ref 0.1–1.0)
Monocytes Relative: 10.1 % (ref 3.0–12.0)
Neutro Abs: 5 10*3/uL (ref 1.4–7.7)
Neutrophils Relative %: 53.3 % (ref 43.0–77.0)
Platelets: 216 10*3/uL (ref 150.0–400.0)
RBC: 5.9 Mil/uL — ABNORMAL HIGH (ref 4.22–5.81)
RDW: 15.9 % — ABNORMAL HIGH (ref 11.5–15.5)
WBC: 9.4 10*3/uL (ref 4.0–10.5)

## 2020-09-01 LAB — HEPATIC FUNCTION PANEL
ALT: 12 U/L (ref 0–53)
AST: 14 U/L (ref 0–37)
Albumin: 4.4 g/dL (ref 3.5–5.2)
Alkaline Phosphatase: 92 U/L (ref 39–117)
Bilirubin, Direct: 0.2 mg/dL (ref 0.0–0.3)
Total Bilirubin: 0.6 mg/dL (ref 0.2–1.2)
Total Protein: 7.2 g/dL (ref 6.0–8.3)

## 2020-09-01 LAB — URINALYSIS, ROUTINE W REFLEX MICROSCOPIC
Bilirubin Urine: NEGATIVE
Hgb urine dipstick: NEGATIVE
Ketones, ur: NEGATIVE
Leukocytes,Ua: NEGATIVE
Nitrite: NEGATIVE
RBC / HPF: NONE SEEN (ref 0–?)
Specific Gravity, Urine: <=1.005 — AB (ref 1.000–1.030)
Total Protein, Urine: NEGATIVE
Urine Glucose: NEGATIVE
Urobilinogen, UA: 0.2 (ref 0.0–1.0)
WBC, UA: NONE SEEN (ref 0–?)
pH: 6.5 (ref 5.0–8.0)

## 2020-09-01 LAB — MICROALBUMIN / CREATININE URINE RATIO
Creatinine,U: 32.4 mg/dL
Microalb Creat Ratio: 22.8 mg/g (ref 0.0–30.0)
Microalb, Ur: 7.4 mg/dL — ABNORMAL HIGH (ref 0.0–1.9)

## 2020-09-01 LAB — LIPID PANEL
Cholesterol: 100 mg/dL (ref 0–200)
HDL: 39.1 mg/dL (ref >39.00)
LDL Cholesterol: 48 mg/dL (ref 0–99)
NonHDL: 61.2
Total CHOL/HDL Ratio: 3
Triglycerides: 66 mg/dL (ref 0.0–149.0)
VLDL: 13.2 mg/dL (ref 0.0–40.0)

## 2020-09-01 LAB — VITAMIN B12: Vitamin B-12: 344 pg/mL (ref 211–911)

## 2020-09-01 LAB — BASIC METABOLIC PANEL
BUN: 35 mg/dL — ABNORMAL HIGH (ref 6–23)
CO2: 33 mEq/L — ABNORMAL HIGH (ref 19–32)
Calcium: 9.8 mg/dL (ref 8.4–10.5)
Chloride: 100 mEq/L (ref 96–112)
Creatinine, Ser: 1.71 mg/dL — ABNORMAL HIGH (ref 0.40–1.50)
GFR: 40.52 mL/min — ABNORMAL LOW (ref >60.00)
Glucose, Bld: 89 mg/dL (ref 70–99)
Potassium: 3.7 mEq/L (ref 3.5–5.1)
Sodium: 138 mEq/L (ref 135–145)

## 2020-09-01 LAB — HEMOGLOBIN A1C: Hgb A1c MFr Bld: 6.1 % (ref 4.6–6.5)

## 2020-09-01 LAB — PSA: PSA: 2.07 ng/mL (ref 0.10–4.00)

## 2020-09-01 LAB — VITAMIN D 25 HYDROXY (VIT D DEFICIENCY, FRACTURES): VITD: 42.2 ng/mL (ref 30.00–100.00)

## 2020-09-01 LAB — TSH: TSH: 1.66 u[IU]/mL (ref 0.35–4.50)

## 2020-09-01 MED ORDER — DILTIAZEM HCL ER COATED BEADS 180 MG PO CP24: 180.0000 mg | ORAL_CAPSULE | Freq: Every day | ORAL | 3 refills | Status: DC

## 2020-09-01 NOTE — Patient Instructions (Signed)
Ok to increase the diltiazem to 180 mg per day  Please continue all other medications as before, and refills have been done if requested.  Please have the pharmacy call with any other refills you may need.  Please continue your efforts at being more active, low cholesterol diet, and weight control.  You are otherwise up to date with prevention measures today.  Please keep your appointments with your specialists as you may have planned - the heart procedure  You will be contacted regarding the referral for: podiatry  Please go to the LAB at the blood drawing area for the tests to be done  You will be contacted by phone if any changes need to be made immediately.  Otherwise, you will receive a letter about your results with an explanation, but please check with MyChart first.  Please remember to sign up for MyChart if you have not done so, as this will be important to you in the future with finding out test results, communicating by private email, and scheduling acute appointments online when needed.  Please make an Appointment to return in 6 months, or sooner if needed

## 2020-09-01 NOTE — Progress Notes (Signed)
Established Patient Office Visit  Subjective:  Patient ID: Roberto Rivera, male    DOB: 24-Mar-1951  Age: 70 y.o. MRN: [id number]       Chief Complaint:: wellness exam and Hypertension (Pt states his BP has been running high) as well as hx of PAF, and c/o worsening toenails       HPI:  Roberto Rivera is a 70 y.o. male here for wellness exam, BP has been elevated in the 150's on average at home it seems; Pt denies chest pain, increased sob or doe, wheezing, orthopnea, PND, increased LE swelling, palpitations, dizziness or syncope.  Did have abnormal stress test but denies symptoms.  Pt reluctant to move forward on further cath or other tx.  Also has worsening nail fungus it seems to multiple nails, asks for podiatry referral Wt Readings from Last 3 Encounters:  09/01/20 244 lb 9.6 oz (110.9 kg)  08/25/20 248 lb (112.5 kg)  08/11/20 245 lb (111.1 kg)   BP Readings from Last 3 Encounters:  09/01/20 (!) 168/72  08/25/20 (!) 152/82  08/11/20 (!) 145/72      Past Medical History:  Diagnosis Date  . ALLERGIC RHINITIS 04/01/2007  . Diabetes mellitus   . DIABETES MELLITUS, TYPE II 04/01/2007  . 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 Hypercholesteremia   . HYPERCHOLESTEROLEMIA 02/07/2008  . Hypertension   . HYPERTENSION 04/01/2007  . MICROCYTOSIS 02/07/2008  . OSTEOARTHRITIS, SPINE 02/07/2008  . PAF (paroxysmal atrial fibrillation) (HCC) 07/11/2020  . Stroke (HCC)   . THYROID CYST 02/07/2008   History reviewed. No pertinent surgical history.  reports that he quit smoking about 2 months ago. His smoking use included cigarettes. He smoked 1.00 pack per day. He has never used smokeless tobacco. He reports that he does not drink alcohol and does not use drugs. family history includes Cancer in his mother; Stroke in his sister. No Known Allergies Current Outpatient Medications on File Prior to Visit  Medication Sig Dispense Refill  . Accu-Chek FastClix Lancets MISC USE TO CHECK BLOOD SUGAR AS  DIRECTED ONCE DAILY 102 each 2  . ACCU-CHEK GUIDE test strip USE AS DIRECTED TO TEST BLOOD SUGAR ONCE DAILY 100 strip 0  . apixaban (ELIQUIS) 5 MG TABS tablet Take 1 tablet (5 mg total) by mouth 2 (two) times daily. 180 tablet 3  . Candesartan Cilexetil-HCTZ 32-25 MG TABS Take by mouth.    . metFORMIN (GLUCOPHAGE-XR) 500 MG 24 hr tablet TAKE 1 TABLET(500 MG) BY MOUTH DAILY WITH BREAKFAST 90 tablet 1  . metoprolol tartrate (LOPRESSOR) 50 MG tablet Take 1 tablet (50 mg total) by mouth 2 (two) times daily. 60 tablet 1  . nitroGLYCERIN (NITROSTAT) 0.4 MG SL tablet Place 1 tablet (0.4 mg total) under the tongue every 5 (five) minutes as needed for chest pain. 90 tablet 3  . repaglinide (PRANDIN) 1 MG tablet Take 1 tablet (1 mg total) by mouth 2 (two) times daily before a meal. 180 tablet 3  . rosuvastatin (CRESTOR) 5 MG tablet 1 tab by mouth once daily 90 tablet 3  . tadalafil (CIALIS) 20 MG tablet Take 20 mg by mouth daily as needed for erectile dysfunction.    . Blood Glucose Monitoring Suppl (ACCU-CHEK AVIVA) device Use to monitor glucose levels daily; E11.21 1 each 0   No current facility-administered medications on file prior to visit.        ROS:  All others reviewed and negative.  Objective        PE:  BP Marland Kitchen)  168/72 (BP Location: Left Arm)   Pulse (!) 56   Temp 98.9 F (37.2 C) (Oral)   Wt 244 lb 9.6 oz (110.9 kg)   SpO2 97%   BMI 34.11 kg/m                 Constitutional: Pt appears in NAD               HENT: Head: NCAT.                Right Ear: External ear normal.                 Left Ear: External ear normal.                Eyes: . Pupils are equal, round, and reactive to light. Conjunctivae and EOM are normal               Nose: without d/c or deformity               Neck: Neck supple. Gross normal ROM               Cardiovascular: Normal rate and regular rhythm.                 Pulmonary/Chest: Effort normal and breath sounds without rales or wheezing.                Abd:   Soft, NT, ND, + BS, no organomegaly               Neurological: Pt is alert. At baseline orientation, motor grossly intact               Skin: Skin is warm. No rashes, no other new lesions, LE edema - none               Psychiatric: Pt behavior is normal without agitation   Assessment/Plan:  Roberto Rivera is a 70 y.o. Black or African American [2] male with  has a past medical history of ALLERGIC RHINITIS (04/01/2007), Diabetes mellitus, DIABETES MELLITUS, TYPE II (04/01/2007), ERECTILE DYSFUNCTION, ORGANIC (02/07/2008), Hypercholesteremia, HYPERCHOLESTEROLEMIA (02/07/2008), Hypertension, HYPERTENSION (04/01/2007), MICROCYTOSIS (02/07/2008), OSTEOARTHRITIS, SPINE (02/07/2008), PAF (paroxysmal atrial fibrillation) (HCC) (07/11/2020), Stroke (HCC), and THYROID CYST (02/07/2008).    Assessment Plan  See notes Labs/data reviewed for each problem:  Micro: none  Cardiac tracings I have personally interpreted today:  none  Pertinent Radiological findings (summarize):  -  Lexiscan (Walking with mod Bruce)Tetrofosmin Stress Test  08/04/2020: Nondiagnostic ECG stress due to pharmacologic stress. Resting EKG demonstrated atrial fibrillation with rapid ventricular response. Non-specific ST-T abnormality. Peak EKG/ demonstrated atrial fibrillation with rapid ventricular response. There is a reversible moderate defect in the lateral and inferior regions. consistent with severe ischemia. Overall LV systolic function is abnormal with regional wall motion abnormalities in the same area.  Stress LV EF: 34%.  No previous exam available for comparison. High risk study.    There are no preventive care reminders to display for this patient.  There are no preventive care reminders to display for this patient.  Lab Results  Component Value Date   TSH 1.66 09/01/2020   Lab Results  Component Value Date   WBC 9.4 09/01/2020   HGB 15.0 09/01/2020   HCT 45.8 09/01/2020   MCV 77.7 (L) 09/01/2020   PLT 216.0  09/01/2020   Lab Results  Component Value Date   NA 138 09/01/2020   K 3.7 09/01/2020   CO2  33 (H) 09/01/2020   GLUCOSE 89 09/01/2020   BUN 35 (H) 09/01/2020   CREATININE 1.71 (H) 09/01/2020   BILITOT 0.6 09/01/2020   ALKPHOS 92 09/01/2020   AST 14 09/01/2020   ALT 12 09/01/2020   PROT 7.2 09/01/2020   ALBUMIN 4.4 09/01/2020   CALCIUM 9.8 09/01/2020   ANIONGAP 10 07/04/2020   GFR 40.52 (L) 09/01/2020   Lab Results  Component Value Date   CHOL 100 09/01/2020   Lab Results  Component Value Date   HDL 39.10 09/01/2020   Lab Results  Component Value Date   LDLCALC 48 09/01/2020   Lab Results  Component Value Date   TRIG 66.0 09/01/2020   Lab Results  Component Value Date   CHOLHDL 3 09/01/2020   Lab Results  Component Value Date   HGBA1C 6.1 09/01/2020      Assessment & Plan:   Problem List Items Addressed This Visit      High   PAF (paroxysmal atrial fibrillation) (HCC)    Stable regular rate and volume today, continue to follow      Relevant Medications   diltiazem (CARDIZEM CD) 180 MG 24 hr capsule   Encounter for well adult exam with abnormal findings - Primary    Overall doing well, age appropriate education and counseling updated, referrals for preventative services and immunizations addressed, dietary and smoking counseling addressed, most recent labs reviewed.  I have personally reviewed and have noted:  1) the patient's medical and social history 2) The pt's use of alcohol, tobacco, and illicit drugs 3) The patient's current medications and supplements 4) Functional ability including ADL's, fall risk, home safety risk, hearing and visual impairment 5) Diet and physical activities 6) Evidence for depression or mood disorder 7) The patient's height, weight, and BMI have been recorded in the chart  I have made referrals, and provided counseling and education based on review of the above       Relevant Orders   PSA (Completed)     Medium    Hypertension associated with diabetes (HCC)    BP Readings from Last 3 Encounters:  09/01/20 (!) 168/72  08/25/20 (!) 152/82  08/11/20 (!) 145/72   Uncontrolled o/w symptomatically Sstable, pt to increase card cd to 180qd  Current Outpatient Medications (Endocrine & Metabolic):  .  metFORMIN (GLUCOPHAGE-XR) 500 MG 24 hr tablet, TAKE 1 TABLET(500 MG) BY MOUTH DAILY WITH BREAKFAST .  repaglinide (PRANDIN) 1 MG tablet, Take 1 tablet (1 mg total) by mouth 2 (two) times daily before a meal.  Current Outpatient Medications (Cardiovascular):  .  Candesartan Cilexetil-HCTZ 32-25 MG TABS, Take by mouth. .  diltiazem (CARDIZEM CD) 180 MG 24 hr capsule, Take 1 capsule (180 mg total) by mouth daily. .  metoprolol tartrate (LOPRESSOR) 50 MG tablet, Take 1 tablet (50 mg total) by mouth 2 (two) times daily. .  nitroGLYCERIN (NITROSTAT) 0.4 MG SL tablet, Place 1 tablet (0.4 mg total) under the tongue every 5 (five) minutes as needed for chest pain. .  rosuvastatin (CRESTOR) 5 MG tablet, 1 tab by mouth once daily .  tadalafil (CIALIS) 20 MG tablet, Take 20 mg by mouth daily as needed for erectile dysfunction.    Current Outpatient Medications (Hematological):  .  apixaban (ELIQUIS) 5 MG TABS tablet, Take 1 tablet (5 mg total) by mouth 2 (two) times daily.  Current Outpatient Medications (Other):  Marland Kitchen  Accu-Chek FastClix Lancets MISC, USE TO CHECK BLOOD SUGAR AS DIRECTED ONCE DAILY .  ACCU-CHEK GUIDE test strip, USE AS DIRECTED TO TEST BLOOD SUGAR ONCE DAILY .  Blood Glucose Monitoring Suppl (ACCU-CHEK AVIVA) device, Use to monitor glucose levels daily; E11.21       Relevant Medications   diltiazem (CARDIZEM CD) 180 MG 24 hr capsule   Hyperlipidemia associated with type 2 diabetes mellitus (HCC)    Lab Results  Component Value Date   LDLCALC 48 09/01/2020   Stable, pt to continue current statin  - crestor 5      Diabetes (HCC)    Lab Results  Component Value Date   HGBA1C 6.1 09/01/2020    Stable, pt to continue current medical treatment - metformin      Relevant Orders   Microalbumin / creatinine urine ratio (Completed)   Hemoglobin A1c (Completed)   Lipid panel (Completed)   CBC with Differential/Platelet (Completed)   Hepatic function panel (Completed)   TSH (Completed)   Urinalysis, Routine w reflex microscopic (Completed)   Basic metabolic panel (Completed)   CKD (chronic kidney disease) stage 3, GFR 30-59 ml/min (HCC)    Lab Results  Component Value Date   CREATININE 1.71 (H) 09/01/2020   Stable overall, cont to avoid nephrotoxins       Abnormal stress test    Pt with recent high risk abnormal stress test, d/w pt, reassured and encouraged f/u with cardiology as recommende, but remains somewhat hesitant        Low   Nail disorder    Also for podiatry referral per pt request      Relevant Orders   Ambulatory referral to Podiatry    Other Visit Diagnoses    B12 deficiency       Relevant Orders   Vitamin B12 (Completed)   Vitamin D deficiency       Relevant Orders   VITAMIN D 25 Hydroxy (Vit-D Deficiency, Fractures) (Completed)      Meds ordered this encounter  Medications  . diltiazem (CARDIZEM CD) 180 MG 24 hr capsule    Sig: Take 1 capsule (180 mg total) by mouth daily.    Dispense:  90 capsule    Refill:  3    Follow-up: Return in about 6 months (around 03/01/2021).   Oliver BarreJames Dayani Winbush, MD 09/05/2020 6:59 PM [facility]

## 2020-09-04 DIAGNOSIS — Z20822 Contact with and (suspected) exposure to covid-19: Secondary | ICD-10-CM | POA: Diagnosis not present

## 2020-09-05 ENCOUNTER — Encounter: Payer: Self-pay | Admitting: Internal Medicine

## 2020-09-05 DIAGNOSIS — R9439 Abnormal result of other cardiovascular function study: Secondary | ICD-10-CM | POA: Insufficient documentation

## 2020-09-05 NOTE — Assessment & Plan Note (Addendum)
Stable regular rate and volume today, continue to follow

## 2020-09-05 NOTE — Assessment & Plan Note (Signed)
Lab Results  Component Value Date   CREATININE 1.71 (H) 09/01/2020   Stable overall, cont to avoid nephrotoxins

## 2020-09-05 NOTE — Assessment & Plan Note (Signed)
Lab Results  Component Value Date   LDLCALC 48 09/01/2020   Stable, pt to continue current statin  - crestor 5

## 2020-09-05 NOTE — Assessment & Plan Note (Signed)

## 2020-09-05 NOTE — Assessment & Plan Note (Signed)
Lab Results  Component Value Date   HGBA1C 6.1 09/01/2020   Stable, pt to continue current medical treatment - metformin

## 2020-09-05 NOTE — Assessment & Plan Note (Signed)
BP Readings from Last 3 Encounters:  09/01/20 (!) 168/72  08/25/20 (!) 152/82  08/11/20 (!) 145/72   Uncontrolled o/w symptomatically Sstable, pt to increase card cd to 180qd  Current Outpatient Medications (Endocrine & Metabolic):  .  metFORMIN (GLUCOPHAGE-XR) 500 MG 24 hr tablet, TAKE 1 TABLET(500 MG) BY MOUTH DAILY WITH BREAKFAST .  repaglinide (PRANDIN) 1 MG tablet, Take 1 tablet (1 mg total) by mouth 2 (two) times daily before a meal.  Current Outpatient Medications (Cardiovascular):  .  Candesartan Cilexetil-HCTZ 32-25 MG TABS, Take by mouth. .  diltiazem (CARDIZEM CD) 180 MG 24 hr capsule, Take 1 capsule (180 mg total) by mouth daily. .  metoprolol tartrate (LOPRESSOR) 50 MG tablet, Take 1 tablet (50 mg total) by mouth 2 (two) times daily. .  nitroGLYCERIN (NITROSTAT) 0.4 MG SL tablet, Place 1 tablet (0.4 mg total) under the tongue every 5 (five) minutes as needed for chest pain. .  rosuvastatin (CRESTOR) 5 MG tablet, 1 tab by mouth once daily .  tadalafil (CIALIS) 20 MG tablet, Take 20 mg by mouth daily as needed for erectile dysfunction.    Current Outpatient Medications (Hematological):  .  apixaban (ELIQUIS) 5 MG TABS tablet, Take 1 tablet (5 mg total) by mouth 2 (two) times daily.  Current Outpatient Medications (Other):  Marland Kitchen  Accu-Chek FastClix Lancets MISC, USE TO CHECK BLOOD SUGAR AS DIRECTED ONCE DAILY .  ACCU-CHEK GUIDE test strip, USE AS DIRECTED TO TEST BLOOD SUGAR ONCE DAILY .  Blood Glucose Monitoring Suppl (ACCU-CHEK AVIVA) device, Use to monitor glucose levels daily; E11.21

## 2020-09-05 NOTE — Assessment & Plan Note (Signed)
Pt with recent high risk abnormal stress test, d/w pt, reassured and encouraged f/u with cardiology as recommende, but remains somewhat hesitant

## 2020-09-05 NOTE — Assessment & Plan Note (Signed)
Also for podiatry referral per pt request

## 2020-09-08 DIAGNOSIS — Z20822 Contact with and (suspected) exposure to covid-19: Secondary | ICD-10-CM | POA: Diagnosis not present

## 2020-09-08 DIAGNOSIS — Z1152 Encounter for screening for COVID-19: Secondary | ICD-10-CM | POA: Diagnosis not present

## 2020-09-15 ENCOUNTER — Ambulatory Visit: Payer: Medicare Other | Admitting: Student

## 2020-09-19 ENCOUNTER — Other Ambulatory Visit: Payer: Self-pay

## 2020-09-19 ENCOUNTER — Ambulatory Visit (HOSPITAL_COMMUNITY)
Admission: EM | Admit: 2020-09-19 | Discharge: 2020-09-19 | Disposition: A | Discharge status: Home / Self Care | Payer: Medicare Other | Attending: Emergency Medicine | Admitting: Emergency Medicine

## 2020-09-19 ENCOUNTER — Encounter (HOSPITAL_COMMUNITY): Payer: Self-pay

## 2020-09-19 DIAGNOSIS — M545 Low back pain, unspecified: Secondary | ICD-10-CM

## 2020-09-19 MED ORDER — METHOCARBAMOL 500 MG PO TABS: 500.0000 mg | ORAL_TABLET | Freq: Two times a day (BID) | ORAL | 0 refills | Status: DC

## 2020-09-19 NOTE — Progress Notes (Signed)
Primary Physician/Referring:  Corwin Levins, MD  Patient ID: Roberto Rivera, male    DOB: 11/07/1950, 70 y.o.   MRN: [id number]  Chief Complaint  Patient presents with  . Atrial Fibrillation  . Hypertension  . Follow-up    3 months   HPI:    Roberto Rivera  is a 70 y.o. AA male with history of type 2 diabetes, stage III chronic kidney disease, hypertension, hyperlipidemia, and tobacco use (smokes pack/day), and paroxysmal atrial fibrillation. He presented to [facility] ED 06/30/2020 with complaints of left hand numbness and weakness. He was diagnosed with small acute cerebral infarct. Cardiac monitoring revealed atrial fibrillation as the likely etiology.  Patient presents for 4 week follow up hypertension and abnormal stress test. At last visit patient opted for PCP to manage his blood pressure and he had elected to hold off on cardiac catheterization in order to do research on the procedure.  Since last visit patient has seen his PCP who increase diltiazem from 120 mg to 180 mg daily.  Patient's home blood pressure readings remain elevated above goal.  Overall patient states he feels well, denies chest pain, shortness of breath, leg swelling, symptoms suggestive of CVA or TIA.  Patient denies dyspnea, orthopnea, PND.  BP, patient is more amenable to proceeding with cardiac catheterization.    Past Medical History:  Diagnosis Date  . ALLERGIC RHINITIS 04/01/2007  . Diabetes mellitus   . DIABETES MELLITUS, TYPE II 04/01/2007  . 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 Hypercholesteremia   . HYPERCHOLESTEROLEMIA 02/07/2008  . Hypertension   . HYPERTENSION 04/01/2007  . MICROCYTOSIS 02/07/2008  . OSTEOARTHRITIS, SPINE 02/07/2008  . PAF (paroxysmal atrial fibrillation) (HCC) 07/11/2020  . Stroke (HCC)   . THYROID CYST 02/07/2008   History reviewed. No pertinent surgical history. Family History  Problem Relation Age of Onset  . Cancer Mother        had uncertain type of cancer  . Stroke Sister      Social History   Tobacco Use  . Smoking status: Former Smoker    Packs/day: 1.00    Types: Cigarettes    Quit date: 06/23/2020    Years since quitting: 0.2  . Smokeless tobacco: Never Used  Substance Use Topics  . Alcohol use: No    Alcohol/week: 0.0 standard drinks   Marital Status: Married   ROS  Review of Systems  Constitutional: Negative for malaise/fatigue and weight gain.  Cardiovascular: Negative for chest pain, claudication, leg swelling, near-syncope, orthopnea, palpitations, paroxysmal nocturnal dyspnea and syncope.  Respiratory: Negative for shortness of breath.   Hematologic/Lymphatic: Does not bruise/bleed easily.  Gastrointestinal: Negative for melena.  Neurological: Negative for dizziness and weakness.    Objective  Blood pressure (!) 145/90, pulse 60, temperature 97.7 F (36.5 C), temperature source Temporal, resp. rate 16, height 5\' 11"  (1.803 m), weight 241 lb 3.2 oz (109.4 kg), SpO2 98 %.  Vitals with BMI 09/20/2020 09/20/2020 09/19/2020  Height - 5\' 11"  -  Weight - 241 lbs 3 oz -  BMI - 33.66 -  Systolic 145 167 929  Diastolic 90 74 92  Pulse 60 60 61      Physical Exam Vitals reviewed.  HENT:     Head: Normocephalic and atraumatic.  Cardiovascular:     Rate and Rhythm: Regular rhythm. Bradycardia present.     Pulses: Intact distal pulses.     Heart sounds: S1 normal and S2 normal. No murmur heard. No gallop.  Comments: No JVD. Trace bilateral lower leg edema.  Pulmonary:     Effort: Pulmonary effort is normal. No respiratory distress.     Breath sounds: No wheezing, rhonchi or rales.  Neurological:     Mental Status: He is alert.    Laboratory examination:   Recent Labs    06/30/20 1456 06/30/20 1527 07/01/20 0314 07/04/20 1037 07/06/20 1614 09/01/20 1006  NA 135   < > 138 137 137 138  K 3.5   < > 3.1* 3.5 3.3* 3.7  CL 98   < > 104 104 98 100  CO2 24  --  25 23 29  33*  GLUCOSE 150*   < > 121* 84 75 89  BUN 20   < > 16 16  21  35*  CREATININE 1.74*   < > 1.52* 1.40* 1.60* 1.71*  CALCIUM 9.6  --  8.9 9.0 10.0 9.8  GFRNONAA 42*  --  50* 55*  --   --    < > = values in this interval not displayed.   estimated creatinine clearance is 52 mL/min (A) (by C-G formula based on SCr of 1.71 mg/dL (H)).  CMP Latest Ref Rng & Units 09/01/2020 07/06/2020 07/04/2020  Glucose 70 - 99 mg/dL 89 75 84  BUN 6 - 23 mg/dL 07/08/2020) 21 16  Creatinine 0.40 - 1.50 mg/dL 07/06/2020) 76(B) 3.41(P)  Sodium 135 - 145 mEq/L 138 137 137  Potassium 3.5 - 5.1 mEq/L 3.7 3.3(L) 3.5  Chloride 96 - 112 mEq/L 100 98 104  CO2 19 - 32 mEq/L 33(H) 29 23  Calcium 8.4 - 10.5 mg/dL 9.8 3.79(K 9.0  Total Protein 6.0 - 8.3 g/dL 7.2 8.3 -  Total Bilirubin 0.2 - 1.2 mg/dL 0.6 0.5 -  Alkaline Phos 39 - 117 U/L 92 100 -  AST 0 - 37 U/L 14 25 -  ALT 0 - 53 U/L 12 21 -   CBC Latest Ref Rng & Units 09/01/2020 07/06/2020 07/04/2020  WBC 4.0 - 10.5 K/uL 9.4 7.2 6.9  Hemoglobin 13.0 - 17.0 g/dL 07/08/2020 07/06/2020 32.9  Hematocrit 39.0 - 52.0 % 45.8 52.1(H) 48.5  Platelets 150.0 - 400.0 K/uL 216.0 210.0 210    Lipid Panel Recent Labs    01/28/20 0956 07/01/20 0314 09/01/20 1006  CHOL 95 79 100  TRIG 65.0 72 66.0  LDLCALC 51 39 48  VLDL 13.0 14 13.2  HDL 31.40* 26* 39.10  CHOLHDL 3 3.0 3    HEMOGLOBIN 13/11/21 Lab Results  Component Value Date   HGBA1C 6.1 09/01/2020   MPG 162.81 06/30/2020   TSH Recent Labs    01/28/20 0956 09/01/20 1006  TSH 1.64 1.66    External labs:  None   Medications and allergies  No Known Allergies   Outpatient Medications Prior to Visit  Medication Sig Dispense Refill  . Accu-Chek FastClix Lancets MISC USE TO CHECK BLOOD SUGAR AS DIRECTED ONCE DAILY 102 each 2  . ACCU-CHEK GUIDE test strip USE AS DIRECTED TO TEST BLOOD SUGAR ONCE DAILY 100 strip 0  . apixaban (ELIQUIS) 5 MG TABS tablet Take 1 tablet (5 mg total) by mouth 2 (two) times daily. 180 tablet 3  . Candesartan Cilexetil-HCTZ 32-25 MG TABS Take 1 tablet by mouth  daily.    03/29/20 diltiazem (CARDIZEM CD) 180 MG 24 hr capsule Take 1 capsule (180 mg total) by mouth daily. 90 capsule 3  . metFORMIN (GLUCOPHAGE-XR) 500 MG 24 hr tablet TAKE 1 TABLET(500 MG) BY MOUTH DAILY  WITH BREAKFAST (Patient taking differently: Take 500 mg by mouth daily with breakfast. TAKE 1 TABLET(500 MG) BY MOUTH DAILY WITH BREAKFAST) 90 tablet 1  . methocarbamol (ROBAXIN) 500 MG tablet Take 1 tablet (500 mg total) by mouth 2 (two) times daily. 20 tablet 0  . metoprolol tartrate (LOPRESSOR) 50 MG tablet Take 1 tablet (50 mg total) by mouth 2 (two) times daily. 60 tablet 1  . nitroGLYCERIN (NITROSTAT) 0.4 MG SL tablet Place 1 tablet (0.4 mg total) under the tongue every 5 (five) minutes as needed for chest pain. 90 tablet 3  . repaglinide (PRANDIN) 1 MG tablet Take 1 tablet (1 mg total) by mouth 2 (two) times daily before a meal. 180 tablet 3  . rosuvastatin (CRESTOR) 5 MG tablet 1 tab by mouth once daily (Patient taking differently: Take 5 mg by mouth at bedtime. 1 tab by mouth once daily) 90 tablet 3  . tadalafil (CIALIS) 20 MG tablet Take 20 mg by mouth daily as needed for erectile dysfunction.    . Blood Glucose Monitoring Suppl (ACCU-CHEK AVIVA) device Use to monitor glucose levels daily; E11.21 1 each 0   No facility-administered medications prior to visit.     Radiology:   No results found.  Cardiac Studies:   PCV MYOCARDIAL PERFUSION WO LEXISCAN 08/04/2020 Nondiagnostic ECG stress due to pharmacologic stress. Resting EKG demonstrated atrial fibrillation with rapid ventricular response. Non-specific ST-T abnormality. Peak EKG/ demonstrated atrial fibrillation with rapid ventricular response. There is a reversible moderate defect in the lateral and inferior regions. consistent with severe ischemia. Overall LV systolic function is abnormal with regional wall motion abnormalities in the same area.  Stress LV EF: 34%. No previous exam available for comparison. High risk  study.  Venous Bilateral Ultrasound 07/02/2020: RIGHT:  - There is no evidence of deep vein thrombosis in the lower extremity.  - No cystic structure found in the popliteal fossa.  LEFT:  - There is no evidence of deep vein thrombosis in the lower extremity.  - No cystic structure found in the popliteal fossa.  ECHOCARDIOGRAM COMPLETE 07/01/2020 1. Left ventricular ejection fraction, by estimation, is 60 to 65%. The left ventricle has normal function. The left ventricle has no regional wall motion abnormalities. There is mild left ventricular hypertrophy. Left ventricular diastolic parameters are consistent with Grade I diastolic dysfunction (impaired relaxation).   2. Right ventricular systolic function is normal. The right ventricular size is normal. Tricuspid regurgitation signal is inadequate for assessing PA pressure.   3. Left atrial size was severely dilated.   4. The mitral valve is normal in structure. No evidence of mitral valve regurgitation. No evidence of mitral stenosis. There was chordal systolic anterior motion noted (not valvular).  5. The aortic valve is tricuspid. Aortic valve regurgitation is mild. Mild aortic valve stenosis. Aortic valve mean gradient measures 14.0 mmHg.   6. Aortic dilatation noted. There is mild dilatation of the ascending aorta, measuring 40 mm.  7. The inferior vena cava is normal in size with greater than 50% respiratory variability, suggesting right atrial pressure of 3 mmHg.  VAS US CAROTID 07/01/2020 Right Carotid: Velocities in the right ICA are consistent with a 1-39% stenosis.  Left Carotid: Velocities in the left ICA are consistent with a 1-39% stenosis.  Vertebrals:  Bilateral vertebral arteries demonstrate antegrade flow.  Subclavians: Normal flow hemodynamics were seen in bilateral subclavian arteries.   EKG:   09/20/2020: Sinus bradycardia at a rate of 57 bpm with borderline first-degree AV block.  Left atrial enlargement.  Left axis,  left anterior fascicular block.  Poor R wave progression, cannot exclude anteroseptal infarct old.  Nonspecific T wave abnormality.  07/12/2020: sinus bradycardia at a rate of 57 bpm with borderline 1st degree AV block, left atrial enlargement. Normal axis.  Nonspecific T wave abnormality.  07/02/2020: Sinus rhythm at a rate of 72 bpm, normal axis.  T wave inversions lateral leads, cannot exclude ischemia.  07/01/2020: Atrial fibrillation with rapid ventricular response at a rate of 115 bpm.  Normal axis.  Assessment     ICD-10-CM   1. Coronary artery disease involving native coronary artery of native heart without angina pectoris  I25.10 hydrALAZINE (APRESOLINE) 25 MG tablet  2. Essential hypertension  I10 EKG 12-Lead    hydrALAZINE (APRESOLINE) 25 MG tablet  3. Abnormal stress test  R94.39 Novel Coronavirus, NAA ([facility])     Medications Discontinued During This Encounter  Medication Reason  . Blood Glucose Monitoring Suppl (ACCU-CHEK AVIVA) device Patient Preference    Meds ordered this encounter  Medications  . hydrALAZINE (APRESOLINE) 25 MG tablet    Sig: Take 1 tablet (25 mg total) by mouth 3 (three) times daily.    Dispense:  90 tablet    Refill:  3   This patients CHA2DS2-VASc Score 5 (HTN, DM, Age, Stroke) and yearly risk of stroke 7.2%.   Recommendations:   Burnice Oestreicher is a 70 y.o. . AA male with history of type 2 diabetes, stage III chronic kidney disease, hypertension, hyperlipidemia, and tobacco use, and paroxysmal atrial fibrillation.  Patient presents for 4-week follow-up of abnormal stress test and hypertension. Patient remains asymptomatic without chest pain. Previously discussed with patient that I recommend coronary angiography to further evaluate for underlying coronary artery disease in view of markedly abnormal nuclear stress test.  However patient previously preferred to hold off on the procedure until he had been able to research it and discuss with his PCP.   Patient patient now prefers to proceed with heart catheterization.  Heart catheterization procedure was explained to the patient in detail. The indication, alternatives, risks and benefits were reviewed. Complications including but not limited to bleeding, infection, acute kidney injury, blood transfusion, heart rhythm disturbances, contrast (dye) reaction, damage to the arteries or nerves in the legs or hands, cerebrovascular accident, myocardial infarction, need for emergent bypass surgery, blood clots in the legs, possible need for emergent blood transfusion, and rarely death were reviewed and discussed with the patient.Patient will be scheduled for first available cardiac catheterization.   In regard to hypertension, patient's blood pressure remains elevated above goal.  Therefore we will add hydralazine 25 mg 3 times daily.  Patient will monitor his blood pressure at home and bring with him a written log to his next office visit.  Follow-up in 4 weeks, sooner if needed, for coronary artery disease and hypertension.   Rayford Halsted, PA-C 09/21/2020, 2:05 PM Office: [phone]

## 2020-09-19 NOTE — Discharge Instructions (Signed)
Take Tylenol as needed for discomfort.    Take the muscle relaxer as needed for muscle spasm; Do not drive, operate machinery, or drink alcohol with this medication as it can cause drowsiness.   Follow up with your primary care provider or an orthopedist if your symptoms are not improving.     

## 2020-09-19 NOTE — ED Triage Notes (Signed)
Pt c/o lower back pain X 3 days. Pt states it hurts more getting out of the bed and he states he struggles to get out of bed. Pt states it hurts to bend, move side to side, lean back, and lean forward.

## 2020-09-19 NOTE — ED Provider Notes (Signed)
[facility]    CSN: [id number] Arrival date & time: 09/19/20  1030      History   Chief Complaint Chief Complaint  Patient presents with  . Back Pain    HPI Roberto Rivera is a 70 y.o. male.   Patient presents with bilateral lower back pain x3 days.  He denies falls or injury.  He states the pain is worse with movement and changing positions.  He states this is similar to a previous episode of back pain a few years ago which was diagnosed as arthritis.  No treatments attempted at home.  He denies numbness, weakness, paresthesias, saddle anesthesia, loss of bowel/bladder control, redness, bruising, wounds, abdominal pain, dysuria, or other symptoms.  His medical history includes hypertension, atrial fibrillation, CKD, diabetes, CVA, back pain, osteoarthritis.  The history is provided by the patient and medical records.    Past Medical History:  Diagnosis Date  . ALLERGIC RHINITIS 04/01/2007  . Diabetes mellitus   . DIABETES MELLITUS, TYPE II 04/01/2007  . 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 Hypercholesteremia   . HYPERCHOLESTEROLEMIA 02/07/2008  . Hypertension   . HYPERTENSION 04/01/2007  . MICROCYTOSIS 02/07/2008  . OSTEOARTHRITIS, SPINE 02/07/2008  . PAF (paroxysmal atrial fibrillation) (HCC) 07/11/2020  . Stroke (HCC)   . THYROID CYST 02/07/2008    Patient Active Problem List   Diagnosis Date Noted  . Abnormal stress test 09/05/2020  . Nail disorder 09/01/2020  . PAF (paroxysmal atrial fibrillation) (HCC) 07/11/2020  . History of stroke 07/06/2020  . Acute CVA (cerebrovascular accident) (HCC) 06/30/2020  . Hyperlipidemia associated with type 2 diabetes mellitus (HCC) 06/30/2020  . Acute kidney injury superimposed on chronic kidney disease (HCC) 06/30/2020  . Low back pain 08/26/2019  . CKD (chronic kidney disease) stage 3, GFR 30-59 ml/min (HCC) 02/05/2019  . Left groin pain 02/05/2019  . Hypokalemia 06/26/2017  . Back pain 05/24/2017  . Abnormal ECG  04/25/2017  . Screening examination for infectious disease 03/10/2016  . Skin nodule 02/26/2015  . Encounter for well adult exam with abnormal findings 04/26/2013  . Rash and nonspecific skin eruption 04/26/2013  . Left knee pain 04/11/2013  . Diabetes (HCC) 10/13/2011  . Screening for prostate cancer 10/13/2011  . Encounter for long-term (current) use of other medications 10/13/2011  . Tobacco use 02/04/2010  . Edema 02/04/2010  . PROTEINURIA, MILD 01/29/2009  . THYROID CYST 02/07/2008  . 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 OSTEOARTHRITIS, SPINE 02/07/2008  . MICROCYTOSIS 02/07/2008  . Hypertension associated with diabetes (HCC) 04/01/2007  . ALLERGIC RHINITIS 04/01/2007    History reviewed. No pertinent surgical history.     Home Medications    Prior to Admission medications   Medication Sig Start Date End Date Taking? Authorizing Provider  apixaban (ELIQUIS) 5 MG TABS tablet Take 1 tablet (5 mg total) by mouth 2 (two) times daily. 08/26/20  Yes Cantwell, Celeste C, PA-C  metFORMIN (GLUCOPHAGE-XR) 500 MG 24 hr tablet TAKE 1 TABLET(500 MG) BY MOUTH DAILY WITH BREAKFAST 07/06/20  Yes Corwin Levins, MD  methocarbamol (ROBAXIN) 500 MG tablet Take 1 tablet (500 mg total) by mouth 2 (two) times daily. 09/19/20  Yes Mickie Bail, NP  metoprolol tartrate (LOPRESSOR) 50 MG tablet Take 1 tablet (50 mg total) by mouth 2 (two) times daily. 08/16/20  Yes Corwin Levins, MD  rosuvastatin (CRESTOR) 5 MG tablet 1 tab by mouth once daily 08/16/20  Yes Corwin Levins, MD  Accu-Chek FastClix Lancets MISC USE TO  CHECK BLOOD SUGAR AS DIRECTED ONCE DAILY 08/18/20   Romero Belling, MD  ACCU-CHEK GUIDE test strip USE AS DIRECTED TO TEST BLOOD SUGAR ONCE DAILY 08/18/20   Romero Belling, MD  Blood Glucose Monitoring Suppl (ACCU-CHEK AVIVA) device Use to monitor glucose levels daily; E11.21 12/27/18 07/09/20  Romero Belling, MD  Candesartan Cilexetil-HCTZ 32-25 MG TABS Take by mouth.    [provider]  diltiazem (CARDIZEM CD) 180 MG 24 hr capsule Take 1 capsule (180 mg total) by mouth daily. 09/01/20   Corwin Levins, MD  nitroGLYCERIN (NITROSTAT) 0.4 MG SL tablet Place 1 tablet (0.4 mg total) under the tongue every 5 (five) minutes as needed for chest pain. 08/25/20 11/23/20  Cantwell, Celeste C, PA-C  repaglinide (PRANDIN) 1 MG tablet Take 1 tablet (1 mg total) by mouth 2 (two) times daily before a meal. 05/26/20   Romero Belling, MD  tadalafil (CIALIS) 20 MG tablet Take 20 mg by mouth daily as needed for erectile dysfunction.    [provider]    Family History Family History  Problem Relation Age of Onset  . Cancer Mother        had uncertain type of cancer  . Stroke Sister     Social History Social History   Tobacco Use  . Smoking status: Former Smoker    Packs/day: 1.00    Types: Cigarettes    Quit date: 06/23/2020    Years since quitting: 0.2  . Smokeless tobacco: Never Used  Vaping Use  . Vaping Use: Never used  Substance Use Topics  . Alcohol use: No    Alcohol/week: 0.0 standard drinks  . Drug use: No     Allergies   Patient has no known allergies.   Review of Systems Review of Systems  Constitutional: Negative for chills and fever.  HENT: Negative for ear pain and sore throat.   Eyes: Negative for pain and visual disturbance.  Respiratory: Negative for cough and shortness of breath.   Cardiovascular: Negative for chest pain and palpitations.  Gastrointestinal: Negative for abdominal pain and vomiting.  Genitourinary: Negative for dysuria and hematuria.  Musculoskeletal: Positive for back pain. Negative for arthralgias.  Skin: Negative for color change and rash.  Neurological: Negative for syncope, weakness and numbness.  All other systems reviewed and are negative.    Physical Exam Triage Vital Signs ED Triage Vitals  Enc Vitals Group     BP 09/19/20 1116 (!) 150/92     Pulse Rate 09/19/20 1116 61     Resp 09/19/20 1116 16      Temp 09/19/20 1116 97.7 F (36.5 C)     Temp Source 09/19/20 1116 Oral     SpO2 09/19/20 1116 97 %     Weight --      Height --      Head Circumference --      Peak Flow --      Pain Score 09/19/20 1114 8     Pain Loc --      Pain Edu? --      Excl. in GC? --    No data found.  Updated Vital Signs BP (!) 150/92 (BP Location: Left Arm)   Pulse 61   Temp 97.7 F (36.5 C) (Oral)   Resp 16   SpO2 97%   Visual Acuity Right Eye Distance:   Left Eye Distance:   Bilateral Distance:    Right Eye Near:   Left Eye Near:    Bilateral  Near:     Physical Exam Vitals and nursing note reviewed.  Constitutional:      General: He is not in acute distress.    Appearance: He is well-developed and well-nourished. He is not ill-appearing.  HENT:     Head: Normocephalic and atraumatic.     Mouth/Throat:     Mouth: Mucous membranes are moist.  Eyes:     Conjunctiva/sclera: Conjunctivae normal.  Cardiovascular:     Rate and Rhythm: Normal rate and regular rhythm.     Heart sounds: Normal heart sounds.  Pulmonary:     Effort: Pulmonary effort is normal. No respiratory distress.     Breath sounds: Normal breath sounds.  Abdominal:     General: Bowel sounds are normal.     Palpations: Abdomen is soft.     Tenderness: There is no abdominal tenderness. There is no right CVA tenderness, left CVA tenderness, guarding or rebound.  Musculoskeletal:        General: No swelling, tenderness, deformity or edema. Normal range of motion.     Cervical back: Neck supple.  Skin:    General: Skin is warm and dry.     Findings: No bruising, erythema, lesion or rash.  Neurological:     General: No focal deficit present.     Mental Status: He is alert and oriented to person, place, and time.     Sensory: No sensory deficit.     Motor: No weakness.     Gait: Gait normal.     Comments: Negative straight leg raise.  Psychiatric:        Mood and Affect: Mood and affect and mood normal.         Behavior: Behavior normal.      UC Treatments / Results  Labs (all labs ordered are listed, but only abnormal results are displayed) Labs Reviewed - No data to display  EKG   Radiology No results found.  Procedures Procedures (including critical care time)  Medications Ordered in UC Medications - No data to display  Initial Impression / Assessment and Plan / UC Course  I have reviewed the triage vital signs and the nursing notes.  Pertinent labs & imaging results that were available during my care of the patient were reviewed by me and considered in my medical decision making (see chart for details).   Bilateral low back pain.  Treating with Tylenol and Robaxin.  Precautions for drowsiness with Robaxin discussed.  Instructed patient to follow-up with his PCP or an orthopedist if his symptoms are not improving.  He agrees to plan of care.   Final Clinical Impressions(s) / UC Diagnoses   Final diagnoses:  Acute bilateral low back pain without sciatica     Discharge Instructions     Take Tylenol as needed for discomfort.  Take the muscle relaxer as needed for muscle spasm; Do not drive, operate machinery, or drink alcohol with this medication as it can cause drowsiness.   Follow up with your primary care provider or an orthopedist if your symptoms are not improving.          ED Prescriptions    Medication Sig Dispense Auth. Provider   methocarbamol (ROBAXIN) 500 MG tablet Take 1 tablet (500 mg total) by mouth 2 (two) times daily. 20 tablet Mickie Bail, NP     I have reviewed the PDMP during this encounter.   Mickie Bail, NP 09/19/20 [phone]

## 2020-09-20 ENCOUNTER — Ambulatory Visit: Payer: Medicare Other | Admitting: Student

## 2020-09-20 ENCOUNTER — Encounter: Payer: Self-pay | Admitting: Student

## 2020-09-20 VITALS — BP 145/90 | HR 60 | Temp 97.7°F | Resp 16 | Ht 71.0 in | Wt 241.2 lb

## 2020-09-20 DIAGNOSIS — I10 Essential (primary) hypertension: Secondary | ICD-10-CM | POA: Diagnosis not present

## 2020-09-20 DIAGNOSIS — R9439 Abnormal result of other cardiovascular function study: Secondary | ICD-10-CM

## 2020-09-20 DIAGNOSIS — I2510 Atherosclerotic heart disease of native coronary artery without angina pectoris: Secondary | ICD-10-CM

## 2020-09-20 MED ORDER — HYDRALAZINE HCL 25 MG PO TABS: 25.0000 mg | ORAL_TABLET | Freq: Three times a day (TID) | ORAL | 3 refills | Status: DC

## 2020-09-22 ENCOUNTER — Other Ambulatory Visit: Payer: Self-pay

## 2020-09-22 ENCOUNTER — Ambulatory Visit: Payer: Medicare Other | Admitting: Podiatry

## 2020-09-22 ENCOUNTER — Encounter: Payer: Self-pay | Admitting: Podiatry

## 2020-09-22 DIAGNOSIS — M79675 Pain in left toe(s): Secondary | ICD-10-CM

## 2020-09-22 DIAGNOSIS — B351 Tinea unguium: Secondary | ICD-10-CM | POA: Diagnosis not present

## 2020-09-22 DIAGNOSIS — M79674 Pain in right toe(s): Secondary | ICD-10-CM | POA: Diagnosis not present

## 2020-09-22 DIAGNOSIS — D689 Coagulation defect, unspecified: Secondary | ICD-10-CM

## 2020-09-23 ENCOUNTER — Encounter: Payer: Self-pay | Admitting: Podiatry

## 2020-09-23 NOTE — Progress Notes (Signed)
  Subjective:  Patient ID: Roberto Rivera, male    DOB: 1951-05-11,  MRN: [id number]  Chief Complaint  Patient presents with  . Debridement    Requesting trim toenails and calluses - diabetic - last a1c 6.1, taking Eliquis   . New Patient (Initial Visit)   70 y.o. male returns for the above complaint.  Patient presents with thickened elongated dystrophic toenails x10.  Patient is on blood thinner.  He would like to have the nails debrided down as he is not able to do it himself.  He states is mildly painful to touch.  He denies any other acute complaints.  Objective:  There were no vitals filed for this visit. Podiatric Exam: Vascular: dorsalis pedis and posterior tibial pulses are palpable bilateral. Capillary return is immediate. Temperature gradient is WNL. Skin turgor WNL  Sensorium: Normal Semmes Weinstein monofilament test. Normal tactile sensation bilaterally. Nail Exam: Pt has thick disfigured discolored nails with subungual debris noted bilateral entire nail hallux through fifth toenails.  Pain on palpation to the nails. Ulcer Exam: There is no evidence of ulcer or pre-ulcerative changes or infection. Orthopedic Exam: Muscle tone and strength are WNL. No limitations in general ROM. No crepitus or effusions noted. HAV  B/L.  Hammer toes 2-5  B/L. Skin: No Porokeratosis. No infection or ulcers    Assessment & Plan:   1. Coagulation defect (HCC)   2. Pain due to onychomycosis of toenails of both feet     Patient was evaluated and treated and all questions answered.  Onychomycosis with pain  -Nails palliatively debrided as below. -Educated on self-care  Procedure: Nail Debridement Rationale: pain  Type of Debridement: manual, sharp debridement. Instrumentation: Nail nipper, rotary burr. Number of Nails: 10  Procedures and Treatment: Consent by patient was obtained for treatment procedures. The patient understood the discussion of treatment and procedures well. All questions  were answered thoroughly reviewed. Debridement of mycotic and hypertrophic toenails, 1 through 5 bilateral and clearing of subungual debris. No ulceration, no infection noted.  Return Visit-Office Procedure: Patient instructed to return to the office for a follow up visit 3 months for continued evaluation and treatment.  Nicholes Rough, DPM    No follow-ups on file.

## 2020-09-25 ENCOUNTER — Other Ambulatory Visit (HOSPITAL_COMMUNITY)
Admission: RE | Admit: 2020-09-25 | Discharge: 2020-09-25 | Disposition: A | Discharge status: Home / Self Care | Payer: Medicare Other | Source: Ambulatory Visit | Attending: Cardiology | Admitting: Cardiology

## 2020-09-25 DIAGNOSIS — Z20822 Contact with and (suspected) exposure to covid-19: Secondary | ICD-10-CM | POA: Insufficient documentation

## 2020-09-25 DIAGNOSIS — Z01812 Encounter for preprocedural laboratory examination: Secondary | ICD-10-CM | POA: Insufficient documentation

## 2020-09-25 LAB — SARS CORONAVIRUS 2 (TAT 6-24 HRS): SARS Coronavirus 2: NEGATIVE

## 2020-09-28 ENCOUNTER — Other Ambulatory Visit: Payer: Self-pay

## 2020-09-28 ENCOUNTER — Encounter (HOSPITAL_COMMUNITY): Payer: Self-pay | Admitting: Cardiology

## 2020-09-28 ENCOUNTER — Ambulatory Visit (HOSPITAL_COMMUNITY)
Admission: RE | Admit: 2020-09-28 | Discharge: 2020-09-28 | Disposition: A | Discharge status: Home / Self Care | Payer: Medicare Other | Attending: Cardiology | Admitting: Cardiology

## 2020-09-28 ENCOUNTER — Encounter (HOSPITAL_COMMUNITY)
Admission: RE | Disposition: A | Discharge status: Home / Self Care | Payer: Self-pay | Source: Home / Self Care | Attending: Cardiology

## 2020-09-28 DIAGNOSIS — I129 Hypertensive chronic kidney disease with stage 1 through stage 4 chronic kidney disease, or unspecified chronic kidney disease: Secondary | ICD-10-CM | POA: Insufficient documentation

## 2020-09-28 DIAGNOSIS — N183 Chronic kidney disease, stage 3 unspecified: Secondary | ICD-10-CM | POA: Insufficient documentation

## 2020-09-28 DIAGNOSIS — R9439 Abnormal result of other cardiovascular function study: Secondary | ICD-10-CM | POA: Diagnosis present

## 2020-09-28 DIAGNOSIS — E785 Hyperlipidemia, unspecified: Secondary | ICD-10-CM | POA: Insufficient documentation

## 2020-09-28 DIAGNOSIS — I480 Paroxysmal atrial fibrillation: Secondary | ICD-10-CM | POA: Diagnosis not present

## 2020-09-28 DIAGNOSIS — F17210 Nicotine dependence, cigarettes, uncomplicated: Secondary | ICD-10-CM | POA: Insufficient documentation

## 2020-09-28 DIAGNOSIS — E1122 Type 2 diabetes mellitus with diabetic chronic kidney disease: Secondary | ICD-10-CM | POA: Insufficient documentation

## 2020-09-28 DIAGNOSIS — I2510 Atherosclerotic heart disease of native coronary artery without angina pectoris: Secondary | ICD-10-CM | POA: Diagnosis not present

## 2020-09-28 DIAGNOSIS — I25118 Atherosclerotic heart disease of native coronary artery with other forms of angina pectoris: Secondary | ICD-10-CM | POA: Diagnosis not present

## 2020-09-28 HISTORY — PX: LEFT HEART CATH AND CORONARY ANGIOGRAPHY: CATH118249

## 2020-09-28 LAB — BASIC METABOLIC PANEL
Anion gap: 12 (ref 5–15)
BUN: 24 mg/dL — ABNORMAL HIGH (ref 8–23)
CO2: 24 mmol/L (ref 22–32)
Calcium: 9.1 mg/dL (ref 8.9–10.3)
Chloride: 102 mmol/L (ref 98–111)
Creatinine, Ser: 1.53 mg/dL — ABNORMAL HIGH (ref 0.61–1.24)
GFR, Estimated: 49 mL/min — ABNORMAL LOW (ref >60)
Glucose, Bld: 117 mg/dL — ABNORMAL HIGH (ref 70–99)
Potassium: 3.1 mmol/L — ABNORMAL LOW (ref 3.5–5.1)
Sodium: 138 mmol/L (ref 135–145)

## 2020-09-28 LAB — GLUCOSE, CAPILLARY: Glucose-Capillary: 105 mg/dL — ABNORMAL HIGH (ref 70–99)

## 2020-09-28 SURGERY — LEFT HEART CATH AND CORONARY ANGIOGRAPHY
Anesthesia: LOCAL

## 2020-09-28 MED ORDER — VERAPAMIL HCL 2.5 MG/ML IV SOLN
INTRAVENOUS | Status: AC
  Filled 2020-09-28: qty 2

## 2020-09-28 MED ORDER — SODIUM CHLORIDE 0.9 % IV SOLN: 250.0000 mL | INTRAVENOUS | Status: DC | PRN

## 2020-09-28 MED ORDER — SODIUM CHLORIDE 0.9% FLUSH: 3.0000 mL | Freq: Two times a day (BID) | INTRAVENOUS | Status: DC

## 2020-09-28 MED ORDER — HEPARIN (PORCINE) IN NACL 1000-0.9 UT/500ML-% IV SOLN
INTRAVENOUS | Status: DC | PRN
  Administered 2020-09-28: 500 mL

## 2020-09-28 MED ORDER — ASPIRIN 81 MG PO CHEW
81.0000 mg | CHEWABLE_TABLET | ORAL | Status: AC
  Administered 2020-09-28: 81 mg via ORAL
  Filled 2020-09-28: qty 1

## 2020-09-28 MED ORDER — SODIUM CHLORIDE 0.9 % WEIGHT BASED INFUSION: 1.0000 mL/kg/h | INTRAVENOUS | Status: DC

## 2020-09-28 MED ORDER — LIDOCAINE HCL (PF) 1 % IJ SOLN
INTRAMUSCULAR | Status: DC | PRN
  Administered 2020-09-28: 3 mL

## 2020-09-28 MED ORDER — HEPARIN SODIUM (PORCINE) 1000 UNIT/ML IJ SOLN
INTRAMUSCULAR | Status: AC
  Filled 2020-09-28: qty 1

## 2020-09-28 MED ORDER — IOHEXOL 350 MG/ML SOLN
INTRAVENOUS | Status: DC | PRN
  Administered 2020-09-28: 65 mL

## 2020-09-28 MED ORDER — LIDOCAINE HCL (PF) 1 % IJ SOLN
INTRAMUSCULAR | Status: AC
  Filled 2020-09-28: qty 30

## 2020-09-28 MED ORDER — HEPARIN (PORCINE) IN NACL 1000-0.9 UT/500ML-% IV SOLN
INTRAVENOUS | Status: AC
  Filled 2020-09-28: qty 500

## 2020-09-28 MED ORDER — SODIUM CHLORIDE 0.9% FLUSH
3.0000 mL | INTRAVENOUS | Status: DC | PRN
Start: 2020-09-28 — End: 2020-09-30

## 2020-09-28 MED ORDER — HEPARIN SODIUM (PORCINE) 1000 UNIT/ML IJ SOLN
INTRAMUSCULAR | Status: DC | PRN
  Administered 2020-09-28: 3500 [IU] via INTRAVENOUS
  Administered 2020-09-28: 2000 [IU] via INTRAVENOUS

## 2020-09-28 MED ORDER — POTASSIUM CHLORIDE CRYS ER 20 MEQ PO TBCR
40.0000 meq | EXTENDED_RELEASE_TABLET | Freq: Once | ORAL | Status: AC
  Administered 2020-09-28: 40 meq via ORAL
  Filled 2020-09-28 (×2): qty 2

## 2020-09-28 MED ORDER — VERAPAMIL HCL 2.5 MG/ML IV SOLN
INTRAVENOUS | Status: DC | PRN
  Administered 2020-09-28: 10 mL via INTRA_ARTERIAL

## 2020-09-28 MED ORDER — FENTANYL CITRATE (PF) 100 MCG/2ML IJ SOLN
INTRAMUSCULAR | Status: AC
  Filled 2020-09-28: qty 2

## 2020-09-28 MED ORDER — SODIUM CHLORIDE 0.9 % WEIGHT BASED INFUSION
3.0000 mL/kg/h | INTRAVENOUS | Status: AC
  Administered 2020-09-28: 3 mL/kg/h via INTRAVENOUS

## 2020-09-28 MED ORDER — FENTANYL CITRATE (PF) 100 MCG/2ML IJ SOLN
INTRAMUSCULAR | Status: DC | PRN
  Administered 2020-09-28: 50 ug via INTRAVENOUS

## 2020-09-28 MED ORDER — MIDAZOLAM HCL 2 MG/2ML IJ SOLN
INTRAMUSCULAR | Status: DC | PRN
  Administered 2020-09-28: 1 mg via INTRAVENOUS

## 2020-09-28 MED ORDER — MIDAZOLAM HCL 2 MG/2ML IJ SOLN
INTRAMUSCULAR | Status: AC
  Filled 2020-09-28: qty 2

## 2020-09-28 SURGICAL SUPPLY — 14 items
BAG SNAP BAND KOVER 36X36 (MISCELLANEOUS) ×1 IMPLANT
CATH 5FR JL3.5 JR4 ANG PIG MP (CATHETERS) ×1 IMPLANT
CATH INFINITI 5 FR 3DRC (CATHETERS) ×1 IMPLANT
CATH INFINITI 5FR AL1 (CATHETERS) ×1 IMPLANT
CATH INFINITI 5FR JL4 (CATHETERS) ×1 IMPLANT
COVER DOME SNAP 22 D (MISCELLANEOUS) ×1 IMPLANT
DEVICE RAD COMP TR BAND LRG (VASCULAR PRODUCTS) ×1 IMPLANT
GLIDESHEATH SLEND A-KIT 6F 22G (SHEATH) ×1 IMPLANT
GUIDEWIRE INQWIRE 1.5J.035X260 (WIRE) IMPLANT
INQWIRE 1.5J .035X260CM (WIRE) ×2
KIT HEART LEFT (KITS) ×2 IMPLANT
PACK CARDIAC CATHETERIZATION (CUSTOM PROCEDURE TRAY) ×2 IMPLANT
TRANSDUCER W/STOPCOCK (MISCELLANEOUS) ×2 IMPLANT
TUBING CIL FLEX 10 FLL-RA (TUBING) ×2 IMPLANT

## 2020-09-28 NOTE — Interval H&P Note (Signed)
History and Physical Interval Note:  09/28/2020 12:53 PM  Roberto Rivera  has presented today for surgery, with the diagnosis of cad - hp.  The various methods of treatment have been discussed with the patient and family. After consideration of risks, benefits and other options for treatment, the patient has consented to  Procedure(s): LEFT HEART CATH AND CORONARY ANGIOGRAPHY (N/A) as a surgical intervention.  The patient's history has been reviewed, patient examined, no change in status, stable for surgery.  I have reviewed the patient's chart and labs.  Questions were answered to the patient's satisfaction.     2016/2017 Appropriate Use Criteria for Coronary Revascularization Clinical Presentation: Diabetes Mellitus? Symptom Status? S/P CABG? Antianginal Therapy (# of long-acting drugs)? Results of Non-invasive testing? FFR/iFR results in all diseased vessels? Patient undergoing renal transplant? Patient undergoing percutaneous valve procedure (TAVR, MitraClip, Others)? Symptom Status:  Asymptomatic  Non-invasive Testing:  Intermediate Risk  If no or indeterminate stress test, FFR/iFR results in all diseased vessels:  N/A  Diabetes Mellitus:  Yes  S/P CABG:  No  Antianginal therapy (number of long-acting drugs):  >=2  Patient undergoing renal transplant:  No  Patient undergoing percutaneous valve procedure:  No    newline 1 Vessel Disease PCI CABG  No proximal LAD involvement, No proximal left dominant LCX involvement M (4); Indication 2 R (3); Indication 2   Proximal left dominant LCX involvement M (5); Indication 5 M (5); Indication 5   Proximal LAD involvement M (5); Indication 5 M (5); Indication 5   newline 2 Vessel Disease  No proximal LAD involvement M (5); Indication 8 M (4); Indication 8   Proximal LAD involvement M (5); Indication 14 A (7); Indication 14   newline 3 Vessel Disease  Low disease complexity (e.g., focal stenoses, SYNTAX <=22) M (6); Indication 19 A (7);  Indication 19   Intermediate or high disease complexity (e.g., SYNTAX >=23) M (4); Indication 23 A (8); Indication 23   newline Left Main Disease  Isolated LMCA disease: ostial or midshaft M (6); Indication 24 A (8); Indication 24   Isolated LMCA disease: bifurcation involvement M (5); Indication 25 A (8); Indication 25   LMCA ostial or midshaft, concurrent low disease burden multivessel disease (e.g., 1-2 additional focal stenoses, SYNTAX <=22) M (6); Indication 26 A (8); Indication 26   LMCA ostial or midshaft, concurrent intermediate or high disease burden multivessel disease (e.g., 1-2 additional bifurcation stenoses, long stenoses, SYNTAX >=23) M (4); Indication 27 A (9); Indication 27   LMCA bifurcation involvement, concurrent low disease burden multivessel disease (e.g., 1-2 additional focal stenoses, SYNTAX <=22) M (4); Indication 28 A (8); Indication 28   LMCA bifurcation involvement, concurrent intermediate or high disease burden multivessel disease (e.g., 1-2 additional bifurcation stenoses, long stenoses, SYNTAX >=23)      Roberto Rivera Roberto Rivera

## 2020-09-28 NOTE — H&P (Signed)
OV 09/20/2020 copied for documentation    Primary Physician/Referring:  Corwin Levins, MD  Patient ID: Roberto Rivera, male    DOB: [birth year], 70 y.o.   MRN: [id number]  No chief complaint on file.  HPI:    Roberto Rivera  is a 70 y.o. AA male with history of type 2 diabetes, stage III chronic kidney disease, hypertension, hyperlipidemia, and tobacco use (smokes pack/day), and paroxysmal atrial fibrillation. He presented to [facility] ED 06/30/2020 with complaints of left hand numbness and weakness. He was diagnosed with small acute cerebral infarct. Cardiac monitoring revealed atrial fibrillation as the likely etiology.  Patient presents for 4 week follow up hypertension and abnormal stress test. At last visit patient opted for PCP to manage his blood pressure and he had elected to hold off on cardiac catheterization in order to do research on the procedure.  Since last visit patient has seen his PCP who increase diltiazem from 120 mg to 180 mg daily.  Patient's home blood pressure readings remain elevated above goal.  Overall patient states he feels well, denies chest pain, shortness of breath, leg swelling, symptoms suggestive of CVA or TIA.  Patient denies dyspnea, orthopnea, PND.  BP, patient is more amenable to proceeding with cardiac catheterization.    Past Medical History:  Diagnosis Date  . ALLERGIC RHINITIS 04/01/2007  . Diabetes mellitus   . DIABETES MELLITUS, TYPE II 04/01/2007  . 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 Hypercholesteremia   . HYPERCHOLESTEROLEMIA 02/07/2008  . Hypertension   . HYPERTENSION 04/01/2007  . MICROCYTOSIS 02/07/2008  . OSTEOARTHRITIS, SPINE 02/07/2008  . PAF (paroxysmal atrial fibrillation) (HCC) 07/11/2020  . Stroke (HCC)   . THYROID CYST 02/07/2008   No past surgical history on file. Family History  Problem Relation Age of Onset  . Cancer Mother        had uncertain type of cancer  . Stroke Sister     Social History   Tobacco Use  . Smoking  status: Former Smoker    Packs/day: 1.00    Types: Cigarettes    Quit date: 06/23/2020    Years since quitting: 0.2  . Smokeless tobacco: Never Used  Substance Use Topics  . Alcohol use: No    Alcohol/week: 0.0 standard drinks   Marital Status: Married   ROS  Review of Systems  Constitutional: Negative for malaise/fatigue and weight gain.  Cardiovascular: Negative for chest pain, claudication, leg swelling, near-syncope, orthopnea, palpitations, paroxysmal nocturnal dyspnea and syncope.  Respiratory: Negative for shortness of breath.   Hematologic/Lymphatic: Does not bruise/bleed easily.  Gastrointestinal: Negative for melena.  Neurological: Negative for dizziness and weakness.    Objective  Blood pressure (!) 148/65, pulse (!) 58, temperature 98 F (36.7 C), temperature source Oral, resp. rate 17, height 5\' 11"  (1.803 m), weight 109.3 kg, SpO2 100 %.  Vitals with BMI 09/28/2020 09/20/2020 09/20/2020  Height 5\' 11"  - 5\' 11"   Weight 241 lbs - 241 lbs 3 oz  BMI 33.63 - 33.66  Systolic 148 145 09/22/2020  Diastolic 65 90 74  Pulse 58 60 60      Physical Exam Vitals reviewed.  HENT:     Head: Normocephalic and atraumatic.  Cardiovascular:     Rate and Rhythm: Regular rhythm. Bradycardia present.     Pulses: Intact distal pulses.     Heart sounds: S1 normal and S2 normal. No murmur heard. No gallop.      Comments: No JVD. Trace bilateral lower leg edema.  Pulmonary:  Effort: Pulmonary effort is normal. No respiratory distress.     Breath sounds: No wheezing, rhonchi or rales.  Neurological:     Mental Status: He is alert.    Laboratory examination:   Recent Labs    07/01/20 0314 07/04/20 1037 07/06/20 1614 09/01/20 1006 09/28/20 1040  NA 138 137 137 138 138  K 3.1* 3.5 3.3* 3.7 3.1*  CL 104 104 98 100 102  CO2 25 23 29  33* 24  GLUCOSE 121* 84 75 89 117*  BUN 16 16 21  35* 24*  CREATININE 1.52* 1.40* 1.60* 1.71* 1.53*  CALCIUM 8.9 9.0 10.0 9.8 9.1  GFRNONAA 50* 55*   --   --  49*   estimated creatinine clearance is 58.1 mL/min (A) (by C-G formula based on SCr of 1.53 mg/dL (H)).  CMP Latest Ref Rng & Units 09/28/2020 09/01/2020 07/06/2020  Glucose 70 - 99 mg/dL 10/30/2020) 89 75  BUN 8 - 23 mg/dL 07/08/2020) 540(J) 21  Creatinine 0.61 - 1.24 mg/dL 81(X) 91(Y) 7.82(N)  Sodium 135 - 145 mmol/L 138 138 137  Potassium 3.5 - 5.1 mmol/L 3.1(L) 3.7 3.3(L)  Chloride 98 - 111 mmol/L 102 100 98  CO2 22 - 32 mmol/L 24 33(H) 29  Calcium 8.9 - 10.3 mg/dL 9.1 9.8 5.62(Z  Total Protein 6.0 - 8.3 g/dL - 7.2 8.3  Total Bilirubin 0.2 - 1.2 mg/dL - 0.6 0.5  Alkaline Phos 39 - 117 U/L - 92 100  AST 0 - 37 U/L - 14 25  ALT 0 - 53 U/L - 12 21   CBC Latest Ref Rng & Units 09/01/2020 07/06/2020 07/04/2020  WBC 4.0 - 10.5 K/uL 9.4 7.2 6.9  Hemoglobin 13.0 - 17.0 g/dL 07/08/2020 07/06/2020 46.9  Hematocrit 39.0 - 52.0 % 45.8 52.1(H) 48.5  Platelets 150.0 - 400.0 K/uL 216.0 210.0 210    Lipid Panel Recent Labs    01/28/20 0956 07/01/20 0314 09/01/20 1006  CHOL 95 79 100  TRIG 65.0 72 66.0  LDLCALC 51 39 48  VLDL 13.0 14 13.2  HDL 31.40* 26* 39.10  CHOLHDL 3 3.0 3    HEMOGLOBIN 13/11/21 Lab Results  Component Value Date   HGBA1C 6.1 09/01/2020   MPG 162.81 06/30/2020   TSH Recent Labs    01/28/20 0956 09/01/20 1006  TSH 1.64 1.66    External labs:  None   Medications and allergies  No Known Allergies   (Not in an outpatient encounter)    Radiology:   No results found.  Cardiac Studies:   PCV MYOCARDIAL PERFUSION WO LEXISCAN 08/04/2020 Nondiagnostic ECG stress due to pharmacologic stress. Resting EKG demonstrated atrial fibrillation with rapid ventricular response. Non-specific ST-T abnormality. Peak EKG/ demonstrated atrial fibrillation with rapid ventricular response. There is a reversible moderate defect in the lateral and inferior regions. consistent with severe ischemia. Overall LV systolic function is abnormal with regional wall motion abnormalities in the  same area.  Stress LV EF: 34%. No previous exam available for comparison. High risk study.  Venous Bilateral Ultrasound 07/02/2020: RIGHT:  - There is no evidence of deep vein thrombosis in the lower extremity.  - No cystic structure found in the popliteal fossa.  LEFT:  - There is no evidence of deep vein thrombosis in the lower extremity.  - No cystic structure found in the popliteal fossa.  ECHOCARDIOGRAM COMPLETE 07/01/2020 1. Left ventricular ejection fraction, by estimation, is 60 to 65%. The left ventricle has normal function. The left ventricle has no regional wall  motion abnormalities. There is mild left ventricular hypertrophy. Left ventricular diastolic parameters are consistent with Grade I diastolic dysfunction (impaired relaxation).   2. Right ventricular systolic function is normal. The right ventricular size is normal. Tricuspid regurgitation signal is inadequate for assessing PA pressure.   3. Left atrial size was severely dilated.   4. The mitral valve is normal in structure. No evidence of mitral valve regurgitation. No evidence of mitral stenosis. There was chordal systolic anterior motion noted (not valvular).  5. The aortic valve is tricuspid. Aortic valve regurgitation is mild. Mild aortic valve stenosis. Aortic valve mean gradient measures 14.0 mmHg.   6. Aortic dilatation noted. There is mild dilatation of the ascending aorta, measuring 40 mm.  7. The inferior vena cava is normal in size with greater than 50% respiratory variability, suggesting right atrial pressure of 3 mmHg.  VAS US CAROTID 07/01/2020 Right Carotid: Velocities in the right ICA are consistent with a 1-39% stenosis.  Left Carotid: Velocities in the left ICA are consistent with a 1-39% stenosis.  Vertebrals:  Bilateral vertebral arteries demonstrate antegrade flow.  Subclavians: Normal flow hemodynamics were seen in bilateral subclavian arteries.   EKG:   09/20/2020: Sinus bradycardia at a rate of 57  bpm with borderline first-degree AV block.  Left atrial enlargement.  Left axis, left anterior fascicular block.  Poor R wave progression, cannot exclude anteroseptal infarct old.  Nonspecific T wave abnormality.  07/12/2020: sinus bradycardia at a rate of 57 bpm with borderline 1st degree AV block, left atrial enlargement. Normal axis.  Nonspecific T wave abnormality.  07/02/2020: Sinus rhythm at a rate of 72 bpm, normal axis.  T wave inversions lateral leads, cannot exclude ischemia.  07/01/2020: Atrial fibrillation with rapid ventricular response at a rate of 115 bpm.  Normal axis.  Assessment   No diagnosis found.   There are no discontinued medications.  Meds ordered this encounter  Medications  . sodium chloride flush (NS) 0.9 % injection 3 mL  . sodium chloride flush (NS) 0.9 % injection 3 mL  . 0.9 %  sodium chloride infusion  . aspirin chewable tablet 81 mg  . FOLLOWED BY Linked Order Group   . 0.9% sodium chloride infusion   . 0.9% sodium chloride infusion  . potassium chloride SA (KLOR-CON) CR tablet 40 mEq   This patients CHA2DS2-VASc Score 5 (HTN, DM, Age, Stroke) and yearly risk of stroke 7.2%.   Recommendations:   Eldric Joachim is a 70 y.o. . AA male with history of type 2 diabetes, stage III chronic kidney disease, hypertension, hyperlipidemia, and tobacco use, and paroxysmal atrial fibrillation.  Patient presents for 4-week follow-up of abnormal stress test and hypertension. Patient remains asymptomatic without chest pain. Previously discussed with patient that I recommend coronary angiography to further evaluate for underlying coronary artery disease in view of markedly abnormal nuclear stress test.  However patient previously preferred to hold off on the procedure until he had been able to research it and discuss with his PCP.  Patient patient now prefers to proceed with heart catheterization.  Heart catheterization procedure was explained to the patient in detail. The  indication, alternatives, risks and benefits were reviewed. Complications including but not limited to bleeding, infection, acute kidney injury, blood transfusion, heart rhythm disturbances, contrast (dye) reaction, damage to the arteries or nerves in the legs or hands, cerebrovascular accident, myocardial infarction, need for emergent bypass surgery, blood clots in the legs, possible need for emergent blood transfusion, and rarely death were  reviewed and discussed with the patient.Patient will be scheduled for first available cardiac catheterization.   In regard to hypertension, patient's blood pressure remains elevated above goal.  Therefore we will add hydralazine 25 mg 3 times daily.  Patient will monitor his blood pressure at home and bring with him a written log to his next office visit.  Follow-up in 4 weeks, sooner if needed, for coronary artery disease and hypertension.   Dartagnan Beavers Emiliano Dyer, PA-C 09/28/2020, 12:40 PM Office: [phone]

## 2020-09-28 NOTE — Discharge Instructions (Signed)
°Radial Site Care ° °This sheet gives you information about how to care for yourself after your procedure. Your health care provider may also give you more specific instructions. If you have problems or questions, contact your health care provider. °What can I expect after the procedure? °After the procedure, it is common to have: °· Bruising and tenderness at the catheter insertion area. °Follow these instructions at home: °Medicines °· Take over-the-counter and prescription medicines only as told by your health care provider. °· HOLD METFORMIN FOR A FULL 48 HOURS AFTER DISCHARGE. °Insertion site care °1. Follow instructions from your health care provider about how to take care of your insertion site. Make sure you: °? Wash your hands with soap and water before you remove your bandage (dressing). If soap and water are not available, use hand sanitizer. °? May remove dressing in 24 hours. °2. Check your insertion site every day for signs of infection. Check for: °? Redness, swelling, or pain. °? Fluid or blood. °? Pus or a bad smell. °? Warmth. °3. Do no take baths, swim, or use a hot tub for 5 days. °4. You may shower 24-48 hours after the procedure. °? Remove the dressing and gently wash the site with plain soap and water. °? Pat the area dry with a clean towel. °? Do not rub the site. That could cause bleeding. °5. Do not apply powder or lotion to the site. °Activity ° °1. For 24 hours after the procedure, or as directed by your health care provider: °? Do not flex or bend the affected arm. °? Do not push or pull heavy objects with the affected arm. °? Do not drive yourself home from the hospital or clinic. You may drive 24 hours after the procedure. °? Do not operate machinery or power tools. °? KEEP ARM ELEVATED THE REMAINDER OF THE DAY. °2. Do not push, pull or lift anything that is heavier than 10 lb for 5 days. °3. Ask your health care provider when it is okay to: °? Return to work or school. °? Resume  usual physical activities or sports. °? Resume sexual activity. °General instructions °· If the catheter site starts to bleed, raise your arm and put firm pressure on the site. If the bleeding does not stop, get help right away. This is a medical emergency. °· DRINK PLENTY OF FLUIDS FOR THE NEXT 2-3 DAYS. °· No alcohol consumption for 24 hours after receiving sedation. °· If you went home on the same day as your procedure, a responsible adult should be with you for the first 24 hours after you arrive home. °· Keep all follow-up visits as told by your health care provider. This is important. °Contact a health care provider if: °· You have a fever. °· You have redness, swelling, or yellow drainage around your insertion site. °Get help right away if: °· You have unusual pain at the radial site. °· The catheter insertion area swells very fast. °· The insertion area is bleeding, and the bleeding does not stop when you hold steady pressure on the area. °· Your arm or hand becomes pale, cool, tingly, or numb. °These symptoms may represent a serious problem that is an emergency. Do not wait to see if the symptoms will go away. Get medical help right away. Call your local emergency services (911 in the [place] Do not drive yourself to the hospital. °Summary °· After the procedure, it is common to have bruising and tenderness at the site. °· Follow instructions   from your health care provider about how to take care of your radial site wound. Check the wound every day for signs of infection. ° °This information is not intended to replace advice given to you by your health care provider. Make sure you discuss any questions you have with your health care provider. °Document Revised: 09/12/2017 Document Reviewed: 09/12/2017 °Elsevier Patient Education © 2020 [employer] °

## 2020-09-29 ENCOUNTER — Ambulatory Visit (INDEPENDENT_AMBULATORY_CARE_PROVIDER_SITE_OTHER): Payer: Medicare Other | Admitting: Endocrinology

## 2020-09-29 VITALS — BP 140/74 | HR 61 | Ht 71.0 in | Wt 240.2 lb

## 2020-09-29 DIAGNOSIS — E1121 Type 2 diabetes mellitus with diabetic nephropathy: Secondary | ICD-10-CM | POA: Diagnosis not present

## 2020-09-29 LAB — POCT GLYCOSYLATED HEMOGLOBIN (HGB A1C): Hemoglobin A1C: 5.7 % — AB (ref 4.0–5.6)

## 2020-09-29 NOTE — Patient Instructions (Addendum)
Please continue the same repaglinide, and stop taking the metformin check your blood sugar once a day.  vary the time of day when you check, between before the 3 meals, and at bedtime.  also check if you have symptoms of your blood sugar being too high or too low.  please keep a record of the readings and bring it to your next appointment here (or you can bring the meter itself).  You can write it on any piece of paper.  please call us sooner if your blood sugar goes below 70, or if you have a lot of readings over 200.   Please come back for a follow-up appointment in 4 months.

## 2020-09-29 NOTE — Progress Notes (Signed)
Subjective:    Patient ID: Roberto Rivera, male    DOB: 1951/05/28, 70 y.o.   MRN: [id number]  HPI Pt returns for f/u of diabetes mellitus: DM type: 2 Dx'ed: 2015. Complications: CRI and mild CAD.   Therapy: 2 oral meds DKA: never Severe hypoglycemia: never.  Pancreatitis: never Pancreatic imaging: never.  SDOH: he declines name brand meds, and also bromocriptine, due to cost.   Other: he has never taken insulin; edema limits rx options; renal insuff limits metformin dosage.   Interval history:  pt states he feels well in general.  He says cbg's vary from 102-117.  He takes meds as rx'ed.   Past Medical History:  Diagnosis Date  . ALLERGIC RHINITIS 04/01/2007  . Diabetes mellitus   . DIABETES MELLITUS, TYPE II 04/01/2007  . 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 Hypercholesteremia   . HYPERCHOLESTEROLEMIA 02/07/2008  . Hypertension   . HYPERTENSION 04/01/2007  . MICROCYTOSIS 02/07/2008  . OSTEOARTHRITIS, SPINE 02/07/2008  . PAF (paroxysmal atrial fibrillation) (HCC) 07/11/2020  . Stroke (HCC)   . THYROID CYST 02/07/2008    Past Surgical History:  Procedure Laterality Date  . LEFT HEART CATH AND CORONARY ANGIOGRAPHY N/A 09/28/2020   Procedure: LEFT HEART CATH AND CORONARY ANGIOGRAPHY;  Surgeon: Elder Negus, MD;  Location: [facility];  Service: Cardiovascular;  Laterality: N/A;    Social History   Socioeconomic History  . Marital status: Divorced    Spouse name: Not on file  . Number of children: 2  . Years of education: Not on file  . Highest education level: Not on file  Occupational History  . 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 Smoking status: Former Smoker    Packs/day: 1.00    Types: Cigarettes    Quit date: 06/23/2020    Years since quitting: 0.2  . Smokeless tobacco: Never Used  Vaping Use  . Vaping Use: Never used  Substance and Sexual Activity  . Alcohol use: No    Alcohol/week: 0.0 standard drinks  . Drug use:  No  . Sexual activity: Not on file  Other Topics Concern  . Not on file  Social History Narrative   Divorced x many years   Social Determinants of Health   Financial Resource Strain: Not on file  Food Insecurity: Not on file  Transportation Needs: Not on file  Physical Activity: Not on file  Stress: Not on file  Social Connections: Not on file  Intimate Partner Violence: Not on file    Current Outpatient Medications on File Prior to Visit  Medication Sig Dispense Refill  . Accu-Chek FastClix Lancets MISC USE TO CHECK BLOOD SUGAR AS DIRECTED ONCE DAILY 102 each 2  . ACCU-CHEK GUIDE test strip USE AS DIRECTED TO TEST BLOOD SUGAR ONCE DAILY 100 strip 0  . apixaban (ELIQUIS) 5 MG TABS tablet Take 1 tablet (5 mg total) by mouth 2 (two) times daily. 180 tablet 3  . Candesartan Cilexetil-HCTZ 32-25 MG TABS Take 1 tablet by mouth daily.    Marland Kitchen diltiazem (CARDIZEM CD) 180 MG 24 hr capsule Take 1 capsule (180 mg total) by mouth daily. 90 capsule 3  . hydrALAZINE (APRESOLINE) 25 MG tablet Take 1 tablet (25 mg total) by mouth 3 (three) times daily. 90 tablet 3  . methocarbamol (ROBAXIN) 500 MG tablet Take 1 tablet (500 mg total) by mouth 2 (two) times daily. 20 tablet 0  . metoprolol tartrate (LOPRESSOR) 50 MG tablet  Take 1 tablet (50 mg total) by mouth 2 (two) times daily. 60 tablet 1  . nitroGLYCERIN (NITROSTAT) 0.4 MG SL tablet Place 1 tablet (0.4 mg total) under the tongue every 5 (five) minutes as needed for chest pain. 90 tablet 3  . repaglinide (PRANDIN) 1 MG tablet Take 1 tablet (1 mg total) by mouth 2 (two) times daily before a meal. 180 tablet 3  . rosuvastatin (CRESTOR) 5 MG tablet 1 tab by mouth once daily (Patient taking differently: Take 5 mg by mouth at bedtime. 1 tab by mouth once daily) 90 tablet 3  . tadalafil (CIALIS) 20 MG tablet Take 20 mg by mouth daily as needed for erectile dysfunction.     No current facility-administered medications on file prior to visit.    No Known  Allergies  Family History  Problem Relation Age of Onset  . Cancer Mother        had uncertain type of cancer  . Stroke Sister     BP 140/74 (BP Location: Right Arm, Patient Position: Sitting, Cuff Size: Large)   Pulse 61   Ht 5\' 11"  (1.803 m)   Wt 240 lb 3.2 oz (109 kg)   SpO2 97%   BMI 33.50 kg/m    Review of Systems He denies hypoglycemia.     Objective:   Physical Exam VITAL SIGNS:  See vs page GENERAL: no distress Pulses: dorsalis pedis intact bilat.   MSK: no deformity of the feet CV: trace bilat leg edema Skin:  no ulcer on the feet, but the skin is dry.  normal color and temp on the feet.   Neuro: sensation is intact to touch on the feet.   Ext: there is bilateral onychomycosis of the toenails.    Lab Results  Component Value Date   HGBA1C 6.1 09/01/2020   Lab Results  Component Value Date   CREATININE 1.53 (H) 09/28/2020   BUN 24 (H) 09/28/2020   NA 138 09/28/2020   K 3.1 (L) 09/28/2020   CL 102 09/28/2020   CO2 24 09/28/2020   Lab Results  Component Value Date   TSH 1.66 09/01/2020       Assessment & Plan:  Type 2 DM: well-controlled.  CRI: she should phase out metformin.   Patient Instructions  Please continue the same repaglinide, and stop taking the metformin check your blood sugar once a day.  vary the time of day when you check, between before the 3 meals, and at bedtime.  also check if you have symptoms of your blood sugar being too high or too low.  please keep a record of the readings and bring it to your next appointment here (or you can bring the meter itself).  You can write it on any piece of paper.  please call 10/30/2020 sooner if your blood sugar goes below 70, or if you have a lot of readings over 200.   Please come back for a follow-up appointment in 4 months.

## 2020-10-07 ENCOUNTER — Telehealth: Payer: Self-pay | Admitting: Internal Medicine

## 2020-10-07 NOTE — Telephone Encounter (Signed)
1.Medication Requested: diltiazem (CARDIZEM CD) 180 MG 24 hr capsule    2. Pharmacy (Name, Street, [place]):  [facility] [phone] - [place], [place] - [address] AT [address]  3. On Med List: yes   4. Last Visit with PCP: 1.12.22  5. Next visit date with PCP:  3.16.22   Agent: Please be advised that RX refills may take up to 3 business days. We ask that you follow-up with your pharmacy.

## 2020-10-08 NOTE — Telephone Encounter (Signed)
Please refill as per office routine med refill policy (all routine meds refilled for 3 mo or monthly per pt preference up to one year from last visit, then month to month grace period for 3 mo, then further med refills will have to be denied)  

## 2020-10-11 ENCOUNTER — Other Ambulatory Visit: Payer: Self-pay

## 2020-10-11 MED ORDER — DILTIAZEM HCL ER COATED BEADS 180 MG PO CP24: 180.0000 mg | ORAL_CAPSULE | Freq: Every day | ORAL | 3 refills | Status: DC

## 2020-10-12 NOTE — Progress Notes (Addendum)
Primary Physician/Referring:  Corwin Levins, MD  Patient ID: Roberto Rivera, male    DOB: 02-05-1951, 70 y.o.   MRN: [id number]  Chief Complaint  Patient presents with  . Follow-up    2 week   HPI:    Roberto Rivera  is a 70 y.o. AA male with history of type 2 diabetes, stage III chronic kidney disease, hypertension, hyperlipidemia, and tobacco use (smokes pack/day), and paroxysmal atrial fibrillation. He presented to [facility] ED 06/30/2020 with complaints of left hand numbness and weakness. He was diagnosed with small acute cerebral infarct. Cardiac monitoring revealed atrial fibrillation as the likely etiology, he is not tolerating long-term anticoagulation.  Patient underwent high risk nuclear stress testing 07/2020, and is a current cardiac catheterization and angiography revealed normal coronary arteries with the exception of RCA disease, recommended medical management.  Pateint presents for follow up after cardiac catheterization 09/28/2020 which revealed normal coronary arteries with the exception of proximal RCA stenosis of 50% and distal RCA subtotal occulusion with TIMI 1 flow. It was recommended to continue medical management and risk factor modification, and to continue Eliquis without aspirin.  Patient is tolerating Eliquis without bleeding diathesis.  Overall he is feeling well.  Denies palpitations, chest pain, shortness of breath, leg swelling, orthopnea, PND, syncope, near syncope.  He is tolerating present medications without issue.   Past Medical History:  Diagnosis Date  . ALLERGIC RHINITIS 04/01/2007  . Diabetes mellitus   . DIABETES MELLITUS, TYPE II 04/01/2007  . 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 Hypercholesteremia   . HYPERCHOLESTEROLEMIA 02/07/2008  . Hypertension   . HYPERTENSION 04/01/2007  . MICROCYTOSIS 02/07/2008  . OSTEOARTHRITIS, SPINE 02/07/2008  . PAF (paroxysmal atrial fibrillation) (HCC) 07/11/2020  . Stroke (HCC)   . THYROID CYST 02/07/2008   Past  Surgical History:  Procedure Laterality Date  . LEFT HEART CATH AND CORONARY ANGIOGRAPHY N/A 09/28/2020   Procedure: LEFT HEART CATH AND CORONARY ANGIOGRAPHY;  Surgeon: Elder Negus, MD;  Location: [facility];  Service: Cardiovascular;  Laterality: N/A;   Family History  Problem Relation Age of Onset  . Cancer Mother        had uncertain type of cancer  . Stroke Sister     Social History   Tobacco Use  . Smoking status: Former Smoker    Packs/day: 1.00    Types: Cigarettes    Quit date: 06/23/2020    Years since quitting: 0.3  . Smokeless tobacco: Never Used  Substance Use Topics  . Alcohol use: No    Alcohol/week: 0.0 standard drinks   Marital Status: Married   ROS  Review of Systems  Constitutional: Negative for malaise/fatigue and weight gain.  Cardiovascular: Negative for chest pain, claudication, leg swelling, near-syncope, orthopnea, palpitations, paroxysmal nocturnal dyspnea and syncope.  Respiratory: Negative for shortness of breath.   Hematologic/Lymphatic: Does not bruise/bleed easily.  Gastrointestinal: Negative for melena.  Neurological: Negative for dizziness and weakness.    Objective  Blood pressure 132/79, pulse 66, temperature 97.9 F (36.6 C), temperature source Temporal, resp. rate 17, height 5\' 11"  (1.803 m), weight 241 lb 9.6 oz (109.6 kg), SpO2 98 %.  Vitals with BMI 10/13/2020 09/29/2020 09/28/2020  Height 5\' 11"  5\' 11"  -  Weight 241 lbs 10 oz 240 lbs 3 oz -  BMI 33.71 33.52 -  Systolic 132 140 11/26/2020  Diastolic 79 74 68  Pulse 66 61 56      Physical Exam Vitals reviewed.  HENT:  Head: Normocephalic and atraumatic.  Cardiovascular:     Rate and Rhythm: Normal rate and regular rhythm.     Pulses: Intact distal pulses.     Heart sounds: S1 normal and S2 normal. No murmur heard. No gallop.      Comments: No JVD. Trace bilateral lower leg edema.  Right radial access site well-healed, no ecchymosis, erythema, hematoma, or  bruit. Pulmonary:     Effort: Pulmonary effort is normal. No respiratory distress.     Breath sounds: No wheezing, rhonchi or rales.  Musculoskeletal:     Right lower leg: No edema.     Left lower leg: No edema.  Skin:    General: Skin is warm and dry.  Neurological:     General: No focal deficit present.     Mental Status: He is alert and oriented to person, place, and time.    Laboratory examination:   Recent Labs    07/01/20 0314 07/04/20 1037 07/06/20 1614 09/01/20 1006 09/28/20 1040  NA 138 137 137 138 138  K 3.1* 3.5 3.3* 3.7 3.1*  CL 104 104 98 100 102  CO2 25 23 29  33* 24  GLUCOSE 121* 84 75 89 117*  BUN 16 16 21  35* 24*  CREATININE 1.52* 1.40* 1.60* 1.71* 1.53*  CALCIUM 8.9 9.0 10.0 9.8 9.1  GFRNONAA 50* 55*  --   --  49*   estimated creatinine clearance is 58.2 mL/min (A) (by C-G formula based on SCr of 1.53 mg/dL (H)).  CMP Latest Ref Rng & Units 09/28/2020 09/01/2020 07/06/2020  Glucose 70 - 99 mg/dL 10/30/2020) 89 75  BUN 8 - 23 mg/dL 07/08/2020) 423(N) 21  Creatinine 0.61 - 1.24 mg/dL 36(R) 44(R) 1.54(M)  Sodium 135 - 145 mmol/L 138 138 137  Potassium 3.5 - 5.1 mmol/L 3.1(L) 3.7 3.3(L)  Chloride 98 - 111 mmol/L 102 100 98  CO2 22 - 32 mmol/L 24 33(H) 29  Calcium 8.9 - 10.3 mg/dL 9.1 9.8 0.86(P  Total Protein 6.0 - 8.3 g/dL - 7.2 8.3  Total Bilirubin 0.2 - 1.2 mg/dL - 0.6 0.5  Alkaline Phos 39 - 117 U/L - 92 100  AST 0 - 37 U/L - 14 25  ALT 0 - 53 U/L - 12 21   CBC Latest Ref Rng & Units 09/01/2020 07/06/2020 07/04/2020  WBC 4.0 - 10.5 K/uL 9.4 7.2 6.9  Hemoglobin 13.0 - 17.0 g/dL 07/08/2020 07/06/2020 26.7  Hematocrit 39.0 - 52.0 % 45.8 52.1(H) 48.5  Platelets 150.0 - 400.0 K/uL 216.0 210.0 210    Lipid Panel Recent Labs    01/28/20 0956 07/01/20 0314 09/01/20 1006  CHOL 95 79 100  TRIG 65.0 72 66.0  LDLCALC 51 39 48  VLDL 13.0 14 13.2  HDL 31.40* 26* 39.10  CHOLHDL 3 3.0 3    HEMOGLOBIN 13/11/21 Lab Results  Component Value Date   HGBA1C 5.7 (A) 09/29/2020    MPG 162.81 06/30/2020   TSH Recent Labs    01/28/20 0956 09/01/20 1006  TSH 1.64 1.66    External labs:  None   Medications and allergies  No Known Allergies   Outpatient Medications Prior to Visit  Medication Sig Dispense Refill  . Accu-Chek FastClix Lancets MISC USE TO CHECK BLOOD SUGAR AS DIRECTED ONCE DAILY 102 each 2  . ACCU-CHEK GUIDE test strip USE AS DIRECTED TO TEST BLOOD SUGAR ONCE DAILY 100 strip 0  . Candesartan Cilexetil-HCTZ 32-25 MG TABS Take 1 tablet by mouth daily.    03/29/20  diltiazem (CARDIZEM CD) 180 MG 24 hr capsule Take 1 capsule (180 mg total) by mouth daily. 90 capsule 3  . hydrALAZINE (APRESOLINE) 25 MG tablet Take 1 tablet (25 mg total) by mouth 3 (three) times daily. 90 tablet 3  . metoprolol tartrate (LOPRESSOR) 50 MG tablet Take 1 tablet (50 mg total) by mouth 2 (two) times daily. 60 tablet 1  . nitroGLYCERIN (NITROSTAT) 0.4 MG SL tablet Place 1 tablet (0.4 mg total) under the tongue every 5 (five) minutes as needed for chest pain. 90 tablet 3  . repaglinide (PRANDIN) 1 MG tablet Take 1 tablet (1 mg total) by mouth 2 (two) times daily before a meal. 180 tablet 3  . rosuvastatin (CRESTOR) 5 MG tablet 1 tab by mouth once daily (Patient taking differently: Take 5 mg by mouth at bedtime. 1 tab by mouth once daily) 90 tablet 3  . apixaban (ELIQUIS) 5 MG TABS tablet Take 1 tablet (5 mg total) by mouth 2 (two) times daily. 180 tablet 3  . methocarbamol (ROBAXIN) 500 MG tablet Take 1 tablet (500 mg total) by mouth 2 (two) times daily. 20 tablet 0  . tadalafil (CIALIS) 20 MG tablet Take 20 mg by mouth daily as needed for erectile dysfunction.     No facility-administered medications prior to visit.     Radiology:   No results found.  Cardiac Studies:   Left heart cath and coronary angiography 09/28/2020:  LM: Large, normal LAD: Large, minimal luminal irregularities Ramus: Large, normal LCx: Large, normal RCA:  Prox calcific 50% stenosis, distal RCA subtotal  occlusion with TIMI 1 flow   PCV MYOCARDIAL PERFUSION WO LEXISCAN 08/04/2020 Nondiagnostic ECG stress due to pharmacologic stress. Resting EKG demonstrated atrial fibrillation with rapid ventricular response. Non-specific ST-T abnormality. Peak EKG/ demonstrated atrial fibrillation with rapid ventricular response. There is a reversible moderate defect in the lateral and inferior regions. consistent with severe ischemia. Overall LV systolic function is abnormal with regional wall motion abnormalities in the same area.  Stress LV EF: 34%. No previous exam available for comparison. High risk study.  Venous Bilateral Ultrasound 07/02/2020: RIGHT:  - There is no evidence of deep vein thrombosis in the lower extremity.  - No cystic structure found in the popliteal fossa.  LEFT:  - There is no evidence of deep vein thrombosis in the lower extremity.  - No cystic structure found in the popliteal fossa.  ECHOCARDIOGRAM COMPLETE 07/01/2020 1. Left ventricular ejection fraction, by estimation, is 60 to 65%. The left ventricle has normal function. The left ventricle has no regional wall motion abnormalities. There is mild left ventricular hypertrophy. Left ventricular diastolic parameters are consistent with Grade I diastolic dysfunction (impaired relaxation).   2. Right ventricular systolic function is normal. The right ventricular size is normal. Tricuspid regurgitation signal is inadequate for assessing PA pressure.   3. Left atrial size was severely dilated.   4. The mitral valve is normal in structure. No evidence of mitral valve regurgitation. No evidence of mitral stenosis. There was chordal systolic anterior motion noted (not valvular).  5. The aortic valve is tricuspid. Aortic valve regurgitation is mild. Mild aortic valve stenosis. Aortic valve mean gradient measures 14.0 mmHg.   6. Aortic dilatation noted. There is mild dilatation of the ascending aorta, measuring 40 mm.  7. The inferior vena  cava is normal in size with greater than 50% respiratory variability, suggesting right atrial pressure of 3 mmHg.  VAS US CAROTID 07/01/2020 Right Carotid: Velocities in the right  ICA are consistent with a 1-39% stenosis.  Left Carotid: Velocities in the left ICA are consistent with a 1-39% stenosis.  Vertebrals:  Bilateral vertebral arteries demonstrate antegrade flow.  Subclavians: Normal flow hemodynamics were seen in bilateral subclavian arteries.   EKG:   09/20/2020: Sinus bradycardia at a rate of 57 bpm with borderline first-degree AV block.  Left atrial enlargement.  Left axis, left anterior fascicular block.  Poor R wave progression, cannot exclude anteroseptal infarct old.  Nonspecific T wave abnormality.  07/12/2020: sinus bradycardia at a rate of 57 bpm with borderline 1st degree AV block, left atrial enlargement. Normal axis.  Nonspecific T wave abnormality.  07/02/2020: Sinus rhythm at a rate of 72 bpm, normal axis.  T wave inversions lateral leads, cannot exclude ischemia.  07/01/2020: Atrial fibrillation with rapid ventricular response at a rate of 115 bpm.  Normal axis.  Assessment     ICD-10-CM   1. Coronary artery disease involving native coronary artery of native heart without angina pectoris  I25.10   2. Essential hypertension  I10   3. PAF (paroxysmal atrial fibrillation) (HCC)  I48.0   4. Hypercholesteremia  E78.00      Medications Discontinued During This Encounter  Medication Reason  . tadalafil (CIALIS) 20 MG tablet Error  . apixaban (ELIQUIS) 5 MG TABS tablet Reorder    Meds ordered this encounter  Medications  . apixaban (ELIQUIS) 5 MG TABS tablet    Sig: Take 1 tablet (5 mg total) by mouth 2 (two) times daily.    Dispense:  180 tablet    Refill:  3   This patients CHA2DS2-VASc Score 5 (HTN, DM, Age, Stroke) and yearly risk of stroke 7.2%.   Recommendations:   Roberto ClevelandRoy Knotek is a 70 y.o. . AA male with history of type 2 diabetes, stage III chronic  kidney disease, hypertension, hyperlipidemia, and tobacco use, and paroxysmal atrial fibrillation. Patient underwent high risk nuclear stress testing 07/2020, and is a current cardiac catheterization and angiography revealed normal coronary arteries with the exception of RCA disease, recommended medical management.  Patient presents for follow up after coronary angiography revealed normal coronary arteries with the exception of diseased RCA.  We will continue medical management of coronary artery disease.  Patient is presently asymptomatic without chest pain.  He is presently tolerating guideline directed medical therapy including candesartan, Toprol tartrate, rosuvastatin well without issue.  He is on Eliquis for long-term anticoagulation in view of paroxysmal atrial fibrillation.  He also is tolerating diltiazem, hydralazine, and HCTZ well without issue.  We will continue current cardiovascular medications as his blood pressure is well controlled and lipids are under excellent control.   Reviewed and discussed with patient recent coronary angiography, details above.   Counseled patient regarding importance of diet and lifestyle modifications in order to further reduce cardiovascular risks.  Encouraged him to notify our office if experiences cardiovascular symptoms, he verbalized understanding agreement.  Follow-up in 6 months, sooner if needed, for hypertension, hyperlipidemia, paroxysmal atrial fibrillation, and tobacco use.   Rayford HalstedCeleste C Rozell Theiler, PA-C 10/13/2020, 9:31 AM Office: [phone]

## 2020-10-13 ENCOUNTER — Ambulatory Visit: Payer: Medicare Other | Admitting: Student

## 2020-10-13 ENCOUNTER — Encounter: Payer: Self-pay | Admitting: Student

## 2020-10-13 ENCOUNTER — Other Ambulatory Visit: Payer: Self-pay

## 2020-10-13 VITALS — BP 132/79 | HR 66 | Temp 97.9°F | Resp 17 | Ht 71.0 in | Wt 241.6 lb

## 2020-10-13 DIAGNOSIS — I2510 Atherosclerotic heart disease of native coronary artery without angina pectoris: Secondary | ICD-10-CM | POA: Diagnosis not present

## 2020-10-13 DIAGNOSIS — I480 Paroxysmal atrial fibrillation: Secondary | ICD-10-CM

## 2020-10-13 DIAGNOSIS — E7800 Pure hypercholesterolemia, unspecified: Secondary | ICD-10-CM | POA: Diagnosis not present

## 2020-10-13 DIAGNOSIS — I10 Essential (primary) hypertension: Secondary | ICD-10-CM | POA: Diagnosis not present

## 2020-10-13 MED ORDER — APIXABAN 5 MG PO TABS: 5.0000 mg | ORAL_TABLET | Freq: Two times a day (BID) | ORAL | 3 refills | Status: DC

## 2020-10-15 ENCOUNTER — Other Ambulatory Visit: Payer: Self-pay | Admitting: Internal Medicine

## 2020-10-15 NOTE — Telephone Encounter (Signed)
Please refill as per office routine med refill policy (all routine meds refilled for 3 mo or monthly per pt preference up to one year from last visit, then month to month grace period for 3 mo, then further med refills will have to be denied)  

## 2020-10-20 ENCOUNTER — Other Ambulatory Visit: Payer: Self-pay

## 2020-11-03 ENCOUNTER — Other Ambulatory Visit: Payer: Self-pay

## 2020-11-03 ENCOUNTER — Ambulatory Visit (INDEPENDENT_AMBULATORY_CARE_PROVIDER_SITE_OTHER): Payer: Medicare Other

## 2020-11-03 ENCOUNTER — Ambulatory Visit: Payer: Medicare Other | Admitting: Internal Medicine

## 2020-11-03 ENCOUNTER — Ambulatory Visit (INDEPENDENT_AMBULATORY_CARE_PROVIDER_SITE_OTHER): Payer: Medicare Other | Admitting: Internal Medicine

## 2020-11-03 VITALS — BP 140/80 | HR 68 | Temp 98.0°F | Ht 71.0 in | Wt 240.0 lb

## 2020-11-03 VITALS — BP 140/80 | HR 68 | Temp 98.1°F | Ht 71.0 in | Wt 240.4 lb

## 2020-11-03 DIAGNOSIS — E559 Vitamin D deficiency, unspecified: Secondary | ICD-10-CM | POA: Diagnosis not present

## 2020-11-03 DIAGNOSIS — Z0000 Encounter for general adult medical examination without abnormal findings: Secondary | ICD-10-CM

## 2020-11-03 DIAGNOSIS — E1159 Type 2 diabetes mellitus with other circulatory complications: Secondary | ICD-10-CM | POA: Diagnosis not present

## 2020-11-03 DIAGNOSIS — E785 Hyperlipidemia, unspecified: Secondary | ICD-10-CM

## 2020-11-03 DIAGNOSIS — Z7901 Long term (current) use of anticoagulants: Secondary | ICD-10-CM | POA: Diagnosis not present

## 2020-11-03 DIAGNOSIS — I152 Hypertension secondary to endocrine disorders: Secondary | ICD-10-CM

## 2020-11-03 DIAGNOSIS — E1169 Type 2 diabetes mellitus with other specified complication: Secondary | ICD-10-CM

## 2020-11-03 DIAGNOSIS — N1831 Chronic kidney disease, stage 3a: Secondary | ICD-10-CM

## 2020-11-03 DIAGNOSIS — J309 Allergic rhinitis, unspecified: Secondary | ICD-10-CM | POA: Diagnosis not present

## 2020-11-03 MED ORDER — ROSUVASTATIN CALCIUM 5 MG PO TABS: 5.0000 mg | ORAL_TABLET | Freq: Every day | ORAL | 3 refills | Status: DC

## 2020-11-03 MED ORDER — CETIRIZINE HCL 10 MG PO TABS: 10.0000 mg | ORAL_TABLET | Freq: Every day | ORAL | 11 refills | Status: DC

## 2020-11-03 MED ORDER — HYDRALAZINE HCL 50 MG PO TABS: 50.0000 mg | ORAL_TABLET | Freq: Three times a day (TID) | ORAL | 11 refills | Status: DC

## 2020-11-03 MED ORDER — TRIAMCINOLONE ACETONIDE 55 MCG/ACT NA AERO: 2.0000 | INHALATION_SPRAY | Freq: Every day | NASAL | 12 refills | Status: DC

## 2020-11-03 MED ORDER — METOPROLOL TARTRATE 50 MG PO TABS: ORAL_TABLET | ORAL | 3 refills | Status: DC

## 2020-11-03 NOTE — Patient Instructions (Addendum)
Ok to increase the hydralazine to 50 mg three times per day  You are given the copay cards and 2 box samples eliquis; please see your cardiologist regarding other issues with insurance coverage  Please take OTC Vitamin D3 at 2000 units per day, indefinitely.  Please take all new medication as prescribed - the zyrtec and nasacort for allergies  Please continue all other medications as before, and refills have been done if requested.  Please have the pharmacy call with any other refills you may need.  Please continue your efforts at being more active, low cholesterol diet, and weight control..  Please keep your appointments with your specialists as you may have planned  Please make an Appointment to return in 6 months, or sooner if needed

## 2020-11-03 NOTE — Patient Instructions (Signed)
Roberto Rivera , Thank you for taking time to come for your Medicare Wellness Visit. I appreciate your ongoing commitment to your health goals. Please review the following plan we discussed and let me know if I can assist you in the future.   Screening recommendations/referrals: Colonoscopy: never done Recommended yearly ophthalmology/optometry visit for glaucoma screening and checkup Recommended yearly dental visit for hygiene and checkup  Vaccinations: Influenza vaccine: declined Pneumococcal vaccine: 12/25/2017, 01/28/2020 (up to date) Tdap vaccine: declined Shingles vaccine: declined   Covid-19: 04/23/2020, 05/14/2020  Advanced directives: Advance directive discussed with you today. Even though you declined this today please call our office should you change your mind and we can give you the proper paperwork for you to fill out.  Conditions/risks identified: Yes; Reviewed health maintenance screenings with patient today and relevant education, vaccines, and/or referrals were provided. Please continue to do your personal lifestyle choices by: daily care of teeth and gums, regular physical activity (goal should be 5 days a week for 30 minutes), eat a healthy diet, avoid tobacco and drug use, limiting any alcohol intake, taking a low-dose aspirin (if not allergic or have been advised by your provider otherwise) and taking vitamins and minerals as recommended by your provider. Continue doing brain stimulating activities (puzzles, reading, adult coloring books, staying active) to keep memory sharp. Continue to eat heart healthy diet (full of fruits, vegetables, whole grains, lean protein, water--limit salt, fat, and sugar intake) and increase physical activity as tolerated.  Next appointment: Please schedule your next Medicare Wellness Visit with your Nurse Health Advisor in 1 year by calling [phone].  Preventive Care 69 Years and Older, Male Preventive care refers to lifestyle choices and visits with  your health care provider that can promote health and wellness. What does preventive care include?  A yearly physical exam. This is also called an annual well check.  Dental exams once or twice a year.  Routine eye exams. Ask your health care provider how often you should have your eyes checked.  Personal lifestyle choices, including:  Daily care of your teeth and gums.  Regular physical activity.  Eating a healthy diet.  Avoiding tobacco and drug use.  Limiting alcohol use.  Practicing safe sex.  Taking low doses of aspirin every day.  Taking vitamin and mineral supplements as recommended by your health care provider. What happens during an annual well check? The services and screenings done by your health care provider during your annual well check will depend on your age, overall health, lifestyle risk factors, and family history of disease. Counseling  Your health care provider may ask you questions about your:  Alcohol use.  Tobacco use.  Drug use.  Emotional well-being.  Home and relationship well-being.  Sexual activity.  Eating habits.  History of falls.  Memory and ability to understand (cognition).  Work and work Astronomer. Screening  You may have the following tests or measurements:  Height, weight, and BMI.  Blood pressure.  Lipid and cholesterol levels. These may be checked every 5 years, or more frequently if you are over 87 years old.  Skin check.  Lung cancer screening. You may have this screening every year starting at age 68 if you have a 30-pack-year history of smoking and currently smoke or have quit within the past 15 years.  Fecal occult blood test (FOBT) of the stool. You may have this test every year starting at age 65.  Flexible sigmoidoscopy or colonoscopy. You may have a sigmoidoscopy every 5  years or a colonoscopy every 10 years starting at age 52.  Prostate cancer screening. Recommendations will vary depending on your  family history and other risks.  Hepatitis C blood test.  Hepatitis B blood test.  Sexually transmitted disease (STD) testing.  Diabetes screening. This is done by checking your blood sugar (glucose) after you have not eaten for a while (fasting). You may have this done every 1-3 years.  Abdominal aortic aneurysm (AAA) screening. You may need this if you are a current or former smoker.  Osteoporosis. You may be screened starting at age 22 if you are at high risk. Talk with your health care provider about your test results, treatment options, and if necessary, the need for more tests. Vaccines  Your health care provider may recommend certain vaccines, such as:  Influenza vaccine. This is recommended every year.  Tetanus, diphtheria, and acellular pertussis (Tdap, Td) vaccine. You may need a Td booster every 10 years.  Zoster vaccine. You may need this after age 76.  Pneumococcal 13-valent conjugate (PCV13) vaccine. One dose is recommended after age 73.  Pneumococcal polysaccharide (PPSV23) vaccine. One dose is recommended after age 41. Talk to your health care provider about which screenings and vaccines you need and how often you need them. This information is not intended to replace advice given to you by your health care provider. Make sure you discuss any questions you have with your health care provider. Document Released: 09/03/2015 Document Revised: 04/26/2016 Document Reviewed: 06/08/2015 Elsevier Interactive Patient Education  2017 [employer].  Fall Prevention in the Home Falls can cause injuries. They can happen to people of all ages. There are many things you can do to make your home safe and to help prevent falls. What can I do on the outside of my home?  Regularly fix the edges of walkways and driveways and fix any cracks.  Remove anything that might make you trip as you walk through a door, such as a raised step or threshold.  Trim any bushes or trees on the  path to your home.  Use bright outdoor lighting.  Clear any walking paths of anything that might make someone trip, such as rocks or tools.  Regularly check to see if handrails are loose or broken. Make sure that both sides of any steps have handrails.  Any raised decks and porches should have guardrails on the edges.  Have any leaves, snow, or ice cleared regularly.  Use sand or salt on walking paths during winter.  Clean up any spills in your garage right away. This includes oil or grease spills. What can I do in the bathroom?  Use night lights.  Install grab bars by the toilet and in the tub and shower. Do not use towel bars as grab bars.  Use non-skid mats or decals in the tub or shower.  If you need to sit down in the shower, use a plastic, non-slip stool.  Keep the floor dry. Clean up any water that spills on the floor as soon as it happens.  Remove soap buildup in the tub or shower regularly.  Attach bath mats securely with double-sided non-slip rug tape.  Do not have throw rugs and other things on the floor that can make you trip. What can I do in the bedroom?  Use night lights.  Make sure that you have a light by your bed that is easy to reach.  Do not use any sheets or blankets that are too big for  your bed. They should not hang down onto the floor.  Have a firm chair that has side arms. You can use this for support while you get dressed.  Do not have throw rugs and other things on the floor that can make you trip. What can I do in the kitchen?  Clean up any spills right away.  Avoid walking on wet floors.  Keep items that you use a lot in easy-to-reach places.  If you need to reach something above you, use a strong step stool that has a grab bar.  Keep electrical cords out of the way.  Do not use floor polish or wax that makes floors slippery. If you must use wax, use non-skid floor wax.  Do not have throw rugs and other things on the floor that can  make you trip. What can I do with my stairs?  Do not leave any items on the stairs.  Make sure that there are handrails on both sides of the stairs and use them. Fix handrails that are broken or loose. Make sure that handrails are as long as the stairways.  Check any carpeting to make sure that it is firmly attached to the stairs. Fix any carpet that is loose or worn.  Avoid having throw rugs at the top or bottom of the stairs. If you do have throw rugs, attach them to the floor with carpet tape.  Make sure that you have a light switch at the top of the stairs and the bottom of the stairs. If you do not have them, ask someone to add them for you. What else can I do to help prevent falls?  Wear shoes that:  Do not have high heels.  Have rubber bottoms.  Are comfortable and fit you well.  Are closed at the toe. Do not wear sandals.  If you use a stepladder:  Make sure that it is fully opened. Do not climb a closed stepladder.  Make sure that both sides of the stepladder are locked into place.  Ask someone to hold it for you, if possible.  Clearly mark and make sure that you can see:  Any grab bars or handrails.  First and last steps.  Where the edge of each step is.  Use tools that help you move around (mobility aids) if they are needed. These include:  Canes.  Walkers.  Scooters.  Crutches.  Turn on the lights when you go into a dark area. Replace any light bulbs as soon as they burn out.  Set up your furniture so you have a clear path. Avoid moving your furniture around.  If any of your floors are uneven, fix them.  If there are any pets around you, be aware of where they are.  Review your medicines with your doctor. Some medicines can make you feel dizzy. This can increase your chance of falling. Ask your doctor what other things that you can do to help prevent falls. This information is not intended to replace advice given to you by your health care  provider. Make sure you discuss any questions you have with your health care provider. Document Released: 06/03/2009 Document Revised: 01/13/2016 Document Reviewed: 09/11/2014 Elsevier Interactive Patient Education  2017 [employer].

## 2020-11-03 NOTE — Progress Notes (Signed)
Subjective:   Roberto Rivera is a 70 y.o. male who presents for Medicare Annual/Subsequent preventive examination.  Review of Systems    No ROS. Medicare Wellness Visit. Additional risk factors are reflected in social history. Cardiac Risk Factors include: advanced age (>98men, >50 women);diabetes mellitus;dyslipidemia;hypertension;male gender;obesity (BMI >30kg/m2) Sleep Patterns: No sleep issues, feels rested on waking and sleeps 5 hours nightly. Home Safety/Smoke Alarms: Feels safe in home; uses home alarm. Smoke alarms in place. Living environment: 1-story home; Lives with significant other; no needs for DME; good support system. Seat Belt Safety/Bike Helmet: Wears seat belt.    Objective:    Today's Vitals   11/03/20 0857  BP: 140/80  Pulse: 68  Temp: 98.1 F (36.7 C)  SpO2: 95%  Weight: 240 lb 6.4 oz (109 kg)  Height: 5\' 11"  (1.803 m)  PainSc: 0-No pain   Body mass index is 33.53 kg/m.  Advanced Directives 11/03/2020 09/28/2020 07/19/2020 07/01/2020 12/25/2017 03/10/2016 07/03/2014  Does Patient Have a Medical Advance Directive? No No No No No No No  Would patient like information on creating a medical advance directive? No - Patient declined No - Patient declined No - Patient declined No - Patient declined - - -    Current Medications (verified) Outpatient Encounter Medications as of 11/03/2020  Medication Sig  . Accu-Chek FastClix Lancets MISC USE TO CHECK BLOOD SUGAR AS DIRECTED ONCE DAILY  . ACCU-CHEK GUIDE test strip USE AS DIRECTED TO TEST BLOOD SUGAR ONCE DAILY  . apixaban (ELIQUIS) 5 MG TABS tablet Take 1 tablet (5 mg total) by mouth 2 (two) times daily.  . Candesartan Cilexetil-HCTZ 32-25 MG TABS Take 1 tablet by mouth daily.  11/05/2020 diltiazem (CARDIZEM CD) 180 MG 24 hr capsule Take 1 capsule (180 mg total) by mouth daily.  . hydrALAZINE (APRESOLINE) 25 MG tablet Take 1 tablet (25 mg total) by mouth 3 (three) times daily.  . metoprolol tartrate (LOPRESSOR) 50 MG tablet  TAKE 1 TABLET(50 MG) BY MOUTH TWICE DAILY  . repaglinide (PRANDIN) 1 MG tablet Take 1 tablet (1 mg total) by mouth 2 (two) times daily before a meal.  . rosuvastatin (CRESTOR) 5 MG tablet 1 tab by mouth once daily (Patient taking differently: Take 5 mg by mouth at bedtime. 1 tab by mouth once daily)  . nitroGLYCERIN (NITROSTAT) 0.4 MG SL tablet Place 1 tablet (0.4 mg total) under the tongue every 5 (five) minutes as needed for chest pain.   No facility-administered encounter medications on file as of 11/03/2020.    Allergies (verified) Patient has no known allergies.   History: Past Medical History:  Diagnosis Date  . ALLERGIC RHINITIS 04/01/2007  . Diabetes mellitus   . DIABETES MELLITUS, TYPE II 04/01/2007  . 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 Hypercholesteremia   . HYPERCHOLESTEROLEMIA 02/07/2008  . Hypertension   . HYPERTENSION 04/01/2007  . MICROCYTOSIS 02/07/2008  . OSTEOARTHRITIS, SPINE 02/07/2008  . PAF (paroxysmal atrial fibrillation) (HCC) 07/11/2020  . Stroke (HCC)   . THYROID CYST 02/07/2008   Past Surgical History:  Procedure Laterality Date  . LEFT HEART CATH AND CORONARY ANGIOGRAPHY N/A 09/28/2020   Procedure: LEFT HEART CATH AND CORONARY ANGIOGRAPHY;  Surgeon: 11/26/2020, MD;  Location: [facility];  Service: Cardiovascular;  Laterality: N/A;   Family History  Problem Relation Age of Onset  . Cancer Mother        had uncertain type of cancer  . Stroke Sister    Social History  Socioeconomic History  . Marital status: Divorced    Spouse name: Not on file  . Number of children: 2  . Years of education: Not on file  . Highest education level: Not on file  Occupational History  . 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 Smoking status: Former Smoker    Packs/day: 1.00    Types: Cigarettes    Quit date: 06/23/2020    Years since quitting: 0.3  . Smokeless tobacco: Never Used  Vaping Use  . Vaping Use: Never  used  Substance and Sexual Activity  . Alcohol use: No    Alcohol/week: 0.0 standard drinks  . Drug use: No  . Sexual activity: Not on file  Other Topics Concern  . Not on file  Social History Narrative   Divorced x many years   Social Determinants of Health   Financial Resource Strain: Low Risk   . Difficulty of Paying Living Expenses: Not hard at all  Food Insecurity: No Food Insecurity  . Worried About Programme researcher, broadcasting/film/video in the Last Year: Never true  . Ran Out of Food in the Last Year: Never true  Transportation Needs: No Transportation Needs  . Lack of Transportation (Medical): No  . Lack of Transportation (Non-Medical): No  Physical Activity: Inactive  . Days of Exercise per Week: 0 days  . Minutes of Exercise per Session: 0 min  Stress: No Stress Concern Present  . Feeling of Stress : Not at all  Social Connections: Moderately Isolated  . Frequency of Communication with Friends and Family: More than three times a week  . Frequency of Social Gatherings with Friends and Family: Once a week  . Attends Religious Services: Never  . Active Member of Clubs or Organizations: No  . Attends Banker Meetings: Never  . Marital Status: Living with partner    Tobacco Counseling Counseling given: Not Answered   Clinical Intake:  Pre-visit preparation completed: Yes  Pain : No/denies pain Pain Score: 0-No pain     BMI - recorded: 33.53 Nutritional Status: BMI > 30  Obese Nutritional Risks: None Diabetes: Yes CBG done?: No Did pt. bring in CBG monitor from home?: No  What is the last grade level you completed in school?: High School Graduate  Diabetic? yes  Interpreter Needed?: No  Information entered by :: Susie Cassette, LPN   Activities of Daily Living In your present state of health, do you have any difficulty performing the following activities: 11/03/2020 07/01/2020  Hearing? N N  Vision? N N  Difficulty concentrating or making decisions?  N N  Walking or climbing stairs? N N  Dressing or bathing? N N  Doing errands, shopping? N N  Preparing Food and eating ? N -  Using the Toilet? N -  In the past six months, have you accidently leaked urine? N -  Do you have problems with loss of bowel control? N -  Managing your Medications? N -  Managing your Finances? N -  Housekeeping or managing your Housekeeping? N -  Some recent data might be hidden    Patient Care Team: Corwin Levins, MD as PCP - General (Internal Medicine) Larey Dresser, DPM (Podiatry)  Indicate any recent Medical Services you may have received from other than [facility] providers in the past year (date may be approximate).     Assessment:   This is a routine wellness examination for [place].  Hearing/Vision screen No exam data  present  Dietary issues and exercise activities discussed: Current Exercise Habits: The patient does not participate in regular exercise at present, Exercise limited by: cardiac condition(s)  Goals   None    Depression Screen PHQ 2/9 Scores 11/03/2020 09/01/2020 08/11/2020 01/28/2020 02/05/2019  PHQ - 2 Score 0 0 0 0 0    Fall Risk Fall Risk  11/03/2020 09/01/2020 01/28/2020  Falls in the past year? 0 0 0  Number falls in past yr: 0 - -  Injury with Fall? 0 - -  Risk for fall due to : No Fall Risks - -  Follow up Falls evaluation completed - -    FALL RISK PREVENTION PERTAINING TO THE HOME:  Any stairs in or around the home? No  If so, are there any without handrails? No  Home free of loose throw rugs in walkways, pet beds, electrical cords, etc? Yes  Adequate lighting in your home to reduce risk of falls? Yes   ASSISTIVE DEVICES UTILIZED TO PREVENT FALLS:  Life alert? No  Use of a cane, walker or w/c? No  Grab bars in the bathroom? Yes  Shower chair or bench in shower? No  Elevated toilet seat or a handicapped toilet? Yes   TIMED UP AND GO:  Was the test performed? No .  Length of time to ambulate 10 feet: 0 sec.    Gait steady and fast without use of assistive device  Cognitive Function: Normal cognitive status assessed by direct observation by this Nurse Health Advisor. No abnormalities found.          Immunizations Immunization History  Administered Date(s) Administered  . PFIZER(Purple Top)SARS-COV-2 Vaccination 05/14/2020  . Pneumococcal Conjugate-13 12/25/2017  . Pneumococcal Polysaccharide-23 02/04/2010, 01/28/2020  . Td 01/29/2009  . Zoster 12/14/2011    TDAP status: Due, Education has been provided regarding the importance of this vaccine. Advised may receive this vaccine at local pharmacy or Health Dept. Aware to provide a copy of the vaccination record if obtained from local pharmacy or Health Dept. Verbalized acceptance and understanding.  Flu Vaccine status: Declined, Education has been provided regarding the importance of this vaccine but patient still declined. Advised may receive this vaccine at local pharmacy or Health Dept. Aware to provide a copy of the vaccination record if obtained from local pharmacy or Health Dept. Verbalized acceptance and understanding.  Pneumococcal vaccine status: Up to date  Covid-19 vaccine status: Completed vaccines  Qualifies for Shingles Vaccine? Yes   Zostavax completed Yes   Shingrix Completed?: No.    Education has been provided regarding the importance of this vaccine. Patient has been advised to call insurance company to determine out of pocket expense if they have not yet received this vaccine. Advised may also receive vaccine at local pharmacy or Health Dept. Verbalized acceptance and understanding.  Screening Tests Health Maintenance  Topic Date Due  . COVID-19 Vaccine (2 - Pfizer 3-dose series) 06/04/2020  . INFLUENZA VACCINE  11/18/2020 (Originally 03/21/2020)  . TETANUS/TDAP  01/27/2021 (Originally 01/30/2019)  . COLONOSCOPY (Pts [id number] Insurance coverage will need to be confirmed)  07/03/2024 (Originally 10/14/1996)  .  OPHTHALMOLOGY EXAM  12/16/2020  . HEMOGLOBIN A1C  03/29/2021  . FOOT EXAM  09/29/2021  . Hepatitis C Screening  Completed  . PNA vac Low Risk Adult  Completed  . HPV VACCINES  Aged Out    Health Maintenance  Health Maintenance Due  Topic Date Due  . COVID-19 Vaccine (2 - Pfizer 3-dose series) 06/04/2020  Colorectal cancer screening: patient declined referral  Lung Cancer Screening: (Low Dose CT Chest recommended if Age 52-80 years, 30 pack-year currently smoking OR have quit w/in 15years.) does qualify.   Lung Cancer Screening Referral: no  Additional Screening:  Hepatitis C Screening: does qualify; Completed yes  Vision Screening: Recommended annual ophthalmology exams for early detection of glaucoma and other disorders of the eye. Is the patient up to date with their annual eye exam?  Yes  Who is the provider or what is the name of the office in which the patient attends annual eye exams? [facility] If pt is not established with a provider, would they like to be referred to a provider to establish care? No .   Dental Screening: Recommended annual dental exams for proper oral hygiene  Community Resource Referral / Chronic Care Management: CRR required this visit?  No   CCM required this visit?  No      Plan:     I have personally reviewed and noted the following in the patient's chart:   . Medical and social history . Use of alcohol, tobacco or illicit drugs  . Current medications and supplements . Functional ability and status . Nutritional status . Physical activity . Advanced directives . List of other physicians . Hospitalizations, surgeries, and ER visits in previous 12 months . Vitals . Screenings to include cognitive, depression, and falls . Referrals and appointments  In addition, I have reviewed and discussed with patient certain preventive protocols, quality metrics, and best practice recommendations. A written personalized  care plan for preventive services as well as general preventive health recommendations were provided to patient.     Mickeal Needy, LPN   [id number]/61/0960   Nurse Notes:  Medications reviewed with patient; no opioid use noted.

## 2020-11-03 NOTE — Progress Notes (Signed)
Patient ID: Roberto Rivera, male   DOB: [birth year], 70 y.o.   MRN: [id number]        Chief Complaint: follow up HTN, bradycardia, low vit d, ckd, hld, allergies and chronic anticoagulation       HPI:  Roberto Rivera is a 70 y.o. male here and Pt denies chest pain, increased sob or doe, wheezing, orthopnea, PND, increased LE swelling, palpitations, dizziness or syncope.  BP remains mild elevated at home in the 140s but HR in the low 50s as well.  Not taking Vit D.  Trying to follow lower chol diet.  Does have several wks ongoing nasal allergy symptoms with clearish congestion, itch and sneezing, without fever, pain, ST, cough, swelling or wheezing.  Asks for sample eliquis as is almost out and has not f/u with cardiology yet.  Denies worsening focal neuro s/s,  Pt denies fever, wt loss, night sweats, loss of appetite, or other constitutional symptoms   Pt denies polydipsia, polyuria,   No other new compalints   Is now off metformin per end recently after a1c 5.7.        Wt Readings from Last 3 Encounters:  11/03/20 240 lb (108.9 kg)  11/03/20 240 lb 6.4 oz (109 kg)  10/13/20 241 lb 9.6 oz (109.6 kg)   BP Readings from Last 3 Encounters:  11/03/20 140/80  11/03/20 140/80  10/13/20 132/79         Past Medical History:  Diagnosis Date  . ALLERGIC RHINITIS 04/01/2007  . Diabetes mellitus   . DIABETES MELLITUS, TYPE II 04/01/2007  . 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 Hypercholesteremia   . HYPERCHOLESTEROLEMIA 02/07/2008  . Hypertension   . HYPERTENSION 04/01/2007  . MICROCYTOSIS 02/07/2008  . OSTEOARTHRITIS, SPINE 02/07/2008  . PAF (paroxysmal atrial fibrillation) (HCC) 07/11/2020  . Stroke (HCC)   . THYROID CYST 02/07/2008   Past Surgical History:  Procedure Laterality Date  . LEFT HEART CATH AND CORONARY ANGIOGRAPHY N/A 09/28/2020   Procedure: LEFT HEART CATH AND CORONARY ANGIOGRAPHY;  Surgeon: Elder Negus, MD;  Location: [facility];  Service: Cardiovascular;  Laterality: N/A;     reports that he quit smoking about 4 months ago. His smoking use included cigarettes. He smoked 1.00 pack per day. He has never used smokeless tobacco. He reports that he does not drink alcohol and does not use drugs. family history includes Cancer in his mother; Stroke in his sister. No Known Allergies Current Outpatient Medications on File Prior to Visit  Medication Sig Dispense Refill  . Accu-Chek FastClix Lancets MISC USE TO CHECK BLOOD SUGAR AS DIRECTED ONCE DAILY 102 each 2  . ACCU-CHEK GUIDE test strip USE AS DIRECTED TO TEST BLOOD SUGAR ONCE DAILY 100 strip 0  . apixaban (ELIQUIS) 5 MG TABS tablet Take 1 tablet (5 mg total) by mouth 2 (two) times daily. 180 tablet 3  . Candesartan Cilexetil-HCTZ 32-25 MG TABS Take 1 tablet by mouth daily.    Marland Kitchen diltiazem (CARDIZEM CD) 180 MG 24 hr capsule Take 1 capsule (180 mg total) by mouth daily. 90 capsule 3  . nitroGLYCERIN (NITROSTAT) 0.4 MG SL tablet Place 1 tablet (0.4 mg total) under the tongue every 5 (five) minutes as needed for chest pain. 90 tablet 3  . repaglinide (PRANDIN) 1 MG tablet Take 1 tablet (1 mg total) by mouth 2 (two) times daily before a meal. 180 tablet 3   No current facility-administered medications on file prior to visit.  ROS:  All others reviewed and negative.  Objective        PE:  BP 140/80   Pulse 68   Temp 98 F (36.7 C) (Oral)   Ht 5\' 11"  (1.803 m)   Wt 240 lb (108.9 kg)   SpO2 95%   BMI 33.47 kg/m                 Constitutional: Pt appears in NAD               HENT: Head: NCAT.                Right Ear: External ear normal.                 Left Ear: External ear normal.                Eyes: . Pupils are equal, round, and reactive to light. Conjunctivae and EOM are normal               Nose: without d/c or deformity               Neck: Neck supple. Gross normal ROM               Cardiovascular: Normal rate and regular rhythm.                 Pulmonary/Chest: Effort normal and breath sounds  without rales or wheezing.                Abd:  Soft, NT, ND, + BS, no organomegaly               Neurological: Pt is alert. At baseline orientation, motor grossly intact               Skin: Skin is warm. No rashes, no other new lesions, LE edema - none               Psychiatric: Pt behavior is normal without agitation   Micro: none  Cardiac tracings I have personally interpreted today:  none  Pertinent Radiological findings (summarize): none   Lab Results  Component Value Date   WBC 9.4 09/01/2020   HGB 15.0 09/01/2020   HCT 45.8 09/01/2020   PLT 216.0 09/01/2020   GLUCOSE 117 (H) 09/28/2020   CHOL 100 09/01/2020   TRIG 66.0 09/01/2020   HDL 39.10 09/01/2020   LDLCALC 48 09/01/2020   ALT 12 09/01/2020   AST 14 09/01/2020   NA 138 09/28/2020   K 3.1 (L) 09/28/2020   CL 102 09/28/2020   CREATININE 1.53 (H) 09/28/2020   BUN 24 (H) 09/28/2020   CO2 24 09/28/2020   TSH 1.66 09/01/2020   PSA 2.07 09/01/2020   INR 1.0 06/30/2020   HGBA1C 5.7 (A) 09/29/2020   MICROALBUR 7.4 (H) 09/01/2020   Assessment/Plan:  Roberto Rivera is a 70 y.o. Black or African American [2] male with  has a past medical history of ALLERGIC RHINITIS (04/01/2007), Diabetes mellitus, DIABETES MELLITUS, TYPE II (04/01/2007), ERECTILE DYSFUNCTION, ORGANIC (02/07/2008), Hypercholesteremia, HYPERCHOLESTEROLEMIA (02/07/2008), Hypertension, HYPERTENSION (04/01/2007), MICROCYTOSIS (02/07/2008), OSTEOARTHRITIS, SPINE (02/07/2008), PAF (paroxysmal atrial fibrillation) (HCC) (07/11/2020), Stroke (HCC), and THYROID CYST (02/07/2008).  Hypertension associated with diabetes (HCC) Mild uncontrolled;  BP Readings from Last 3 Encounters:  11/03/20 140/80  11/03/20 140/80  10/13/20 132/79  for increased hydralazine 50 tid  Vitamin D deficiency Last vitamin D Lab Results  Component Value Date   VD25OH 42.20 09/01/2020  Stable, cont oral replacement   CKD (chronic kidney disease) stage 3, GFR 30-59 ml/min (HCC) Lab Results   Component Value Date   CREATININE 1.53 (H) 09/28/2020   Stable overall, cont to avoid nephrotoxins  Allergic rhinitis Mild to mod, for zyrtec and nasacort asd,  to f/u any worsening symptoms or concerns  Hyperlipidemia associated with type 2 diabetes mellitus (HCC) Lab Results  Component Value Date   LDLCALC 48 09/01/2020   Stable, pt to continue current statin crestor 5   Chronic anticoagulation On eliquis to continue as is, gave samples and copay card today, to f/u cards as planned  Followup: Return in about 6 months (around 05/06/2021).  Oliver Barre, MD 11/08/2020 2:32 AM [facility]

## 2020-11-08 ENCOUNTER — Encounter: Payer: Self-pay | Admitting: Internal Medicine

## 2020-11-08 DIAGNOSIS — Z7901 Long term (current) use of anticoagulants: Secondary | ICD-10-CM | POA: Insufficient documentation

## 2020-11-08 DIAGNOSIS — E559 Vitamin D deficiency, unspecified: Secondary | ICD-10-CM | POA: Insufficient documentation

## 2020-11-08 NOTE — Assessment & Plan Note (Signed)
Lab Results  Component Value Date   LDLCALC 48 09/01/2020   Stable, pt to continue current statin crestor 5

## 2020-11-08 NOTE — Assessment & Plan Note (Signed)
Last vitamin D Lab Results  Component Value Date   VD25OH 42.20 09/01/2020   Stable, cont oral replacement

## 2020-11-08 NOTE — Assessment & Plan Note (Signed)
On eliquis to continue as is, gave samples and copay card today, to f/u cards as planned

## 2020-11-08 NOTE — Assessment & Plan Note (Signed)
Mild to mod, for zyrtec and nasacort asd,  to f/u any worsening symptoms or concerns 

## 2020-11-08 NOTE — Assessment & Plan Note (Signed)
Mild uncontrolled;  BP Readings from Last 3 Encounters:  11/03/20 140/80  11/03/20 140/80  10/13/20 132/79  for increased hydralazine 50 tid

## 2020-11-08 NOTE — Assessment & Plan Note (Signed)
Lab Results  Component Value Date   CREATININE 1.53 (H) 09/28/2020   Stable overall, cont to avoid nephrotoxins

## 2020-11-19 ENCOUNTER — Other Ambulatory Visit: Payer: Self-pay | Admitting: Endocrinology

## 2020-12-09 ENCOUNTER — Other Ambulatory Visit: Payer: Self-pay

## 2020-12-09 MED ORDER — APIXABAN 5 MG PO TABS: 5.0000 mg | ORAL_TABLET | Freq: Two times a day (BID) | ORAL | 3 refills | Status: DC

## 2020-12-12 ENCOUNTER — Other Ambulatory Visit: Payer: Self-pay | Admitting: Internal Medicine

## 2020-12-12 DIAGNOSIS — E1121 Type 2 diabetes mellitus with diabetic nephropathy: Secondary | ICD-10-CM

## 2020-12-15 ENCOUNTER — Other Ambulatory Visit: Payer: Self-pay

## 2020-12-29 ENCOUNTER — Telehealth: Payer: Self-pay

## 2020-12-29 NOTE — Telephone Encounter (Signed)
-----   Message from Kathyrn Sheriff, [facility] sent at 12/29/2020  2:57 PM EDT ----- Regarding: FW: [facility] referral See if you can get in touch, I didn't have any luck  ----- Message ----- From: Kathyrn Sheriff, [facility] Sent: 07/20/2020   4:40 PM EDT To: Kathyrn Sheriff, RPH Subject: [facility] [address] referral                                   Patient with recent hospital admission for CVA and Atrial fib,11/10-11/12/21    he was started on  Eliquis. He states that the            30 day  copay card provided to him at discharge did not work at            pharmacy, he states it was explained to him it had something to do            with his insurance. Patient has followed up with cardiology with cost            concern of eliquis, states he prefers to stay on this medication..             Patient was able to get prescription filled for cost of $147 . He             states he will try to  work it out for next refill.Will benefit from             pharmacy referral due to cost concerns.      Reason for Consult -> Medication Assistance Cmt: Eliquis

## 2020-12-29 NOTE — Chronic Care Management (AMB) (Signed)
Spoke with patient, reports that he has been working with cardiology, notes that he has been set up with a patient assistance program and has been set up for autorefills from the Eliquis patient assistance program.  Patient reports that he is not having any issues regarding cost of medication at this time.  Patient aware to reach out with any issues or concerns regarding Eliquis cost in the future  Ellin Saba, PharmD Clinical Pharmacist, [facility]

## 2021-01-19 HISTORY — PX: OTHER SURGICAL HISTORY: SHX169

## 2021-01-27 ENCOUNTER — Other Ambulatory Visit: Payer: Self-pay | Admitting: Internal Medicine

## 2021-02-09 ENCOUNTER — Ambulatory Visit (INDEPENDENT_AMBULATORY_CARE_PROVIDER_SITE_OTHER): Payer: Medicare Other | Admitting: Endocrinology

## 2021-02-09 ENCOUNTER — Other Ambulatory Visit: Payer: Self-pay

## 2021-02-09 VITALS — BP 124/76 | HR 99 | Ht 71.0 in | Wt 242.6 lb

## 2021-02-09 DIAGNOSIS — E1121 Type 2 diabetes mellitus with diabetic nephropathy: Secondary | ICD-10-CM | POA: Diagnosis not present

## 2021-02-09 LAB — POCT GLYCOSYLATED HEMOGLOBIN (HGB A1C): Hemoglobin A1C: 5.7 % — AB (ref 4.0–5.6)

## 2021-02-09 MED ORDER — ACCU-CHEK GUIDE VI STRP
1.0000 | ORAL_STRIP | Freq: Every day | 0 refills | Status: DC
Start: 2021-02-09 — End: 2021-06-15

## 2021-02-09 MED ORDER — REPAGLINIDE 1 MG PO TABS: 1.0000 mg | ORAL_TABLET | Freq: Every day | ORAL | 3 refills | Status: DC

## 2021-02-09 NOTE — Progress Notes (Signed)
Subjective:    Patient ID: Roberto Rivera, male    DOB: 20-Oct-1950, 70 y.o.   MRN: [id number]  HPI Pt returns for f/u of diabetes mellitus: DM type: 2 Dx'ed: 2015. Complications: CRI and mild CAD.   Therapy: repaglinide DKA: never Severe hypoglycemia: never.  Pancreatitis: never Pancreatic imaging: never.  SDOH: he declines name brand meds, and also bromocriptine, due to cost.   Other: he has never taken insulin; edema limits rx options; renal insuff limits metformin dosage.   Interval history:  Meter is downloaded today, and the printout is scanned into the record.  cbg's vary from 92-122.  He takes meds as rx'ed.   Past Medical History:  Diagnosis Date   ALLERGIC RHINITIS 04/01/2007   Diabetes mellitus    DIABETES MELLITUS, TYPE II 04/01/2007   ERECTILE DYSFUNCTION, ORGANIC 02/07/2008   Hypercholesteremia    HYPERCHOLESTEROLEMIA 02/07/2008   Hypertension    HYPERTENSION 04/01/2007   MICROCYTOSIS 02/07/2008   OSTEOARTHRITIS, SPINE 02/07/2008   PAF (paroxysmal atrial fibrillation) (HCC) 07/11/2020   Stroke (HCC)    THYROID CYST 02/07/2008    Past Surgical History:  Procedure Laterality Date   LEFT HEART CATH AND CORONARY ANGIOGRAPHY N/A 09/28/2020   Procedure: LEFT HEART CATH AND CORONARY ANGIOGRAPHY;  Surgeon: Elder Negus, MD;  Location: [facility];  Service: Cardiovascular;  Laterality: N/A;    Social History   Socioeconomic History   Marital status: Divorced    Spouse name: Not on file   Number of children: 2   Years of education: Not on file   Highest education level: Not on file  Occupational History   Occupation: Textile    Employer: [employer]  Tobacco Use   Smoking status: Former    Packs/day: 1.00    Pack years: 0.00    Types: Cigarettes    Quit date: 06/23/2020    Years since quitting: 0.6   Smokeless tobacco: Never  Vaping Use   Vaping Use: Never used  Substance and Sexual Activity   Alcohol use: No    Alcohol/week: 0.0 standard drinks    Drug use: No   Sexual activity: Not on file  Other Topics Concern   Not on file  Social History Narrative   Divorced x many years   Social Determinants of Health   Financial Resource Strain: Low Risk    Difficulty of Paying Living Expenses: Not hard at all  Food Insecurity: No Food Insecurity   Worried About Programme researcher, broadcasting/film/video in the Last Year: Never true   Barista in the Last Year: Never true  Transportation Needs: No Transportation Needs   Lack of Transportation (Medical): No   Lack of Transportation (Non-Medical): No  Physical Activity: Inactive   Days of Exercise per Week: 0 days   Minutes of Exercise per Session: 0 min  Stress: No Stress Concern Present   Feeling of Stress : Not at all  Social Connections: Moderately Isolated   Frequency of Communication with Friends and Family: More than three times a week   Frequency of Social Gatherings with Friends and Family: Once a week   Attends Religious Services: Never   Database administrator or Organizations: No   Attends Banker Meetings: Never   Marital Status: Living with partner  Intimate Partner Violence: Not on file    Current Outpatient Medications on File Prior to Visit  Medication Sig Dispense Refill   Accu-Chek FastClix Lancets MISC USE TO CHECK  BLOOD SUGAR AS DIRECTED ONCE DAILY 102 each 2   apixaban (ELIQUIS) 5 MG TABS tablet Take 1 tablet (5 mg total) by mouth 2 (two) times daily. 180 tablet 3   Candesartan Cilexetil-HCTZ 32-25 MG TABS TAKE 1 TABLET BY MOUTH EVERY DAY 90 tablet 1   cetirizine (ZYRTEC) 10 MG tablet Take 1 tablet (10 mg total) by mouth daily. 30 tablet 11   diltiazem (CARDIZEM CD) 180 MG 24 hr capsule Take 1 capsule (180 mg total) by mouth daily. 90 capsule 3   hydrALAZINE (APRESOLINE) 50 MG tablet Take 1 tablet (50 mg total) by mouth 3 (three) times daily. 90 tablet 11   metoprolol tartrate (LOPRESSOR) 50 MG tablet TAKE 1 TABLET(50 MG) BY MOUTH TWICE DAILY 180 tablet 3    nitroGLYCERIN (NITROSTAT) 0.4 MG SL tablet Place 1 tablet (0.4 mg total) under the tongue every 5 (five) minutes as needed for chest pain. 90 tablet 3   rosuvastatin (CRESTOR) 5 MG tablet Take 1 tablet (5 mg total) by mouth at bedtime. 1 tab by mouth once daily 90 tablet 3   triamcinolone (NASACORT) 55 MCG/ACT AERO nasal inhaler Place 2 sprays into the nose daily. (Patient not taking: Reported on 02/09/2021) 1 each 12   No current facility-administered medications on file prior to visit.    No Known Allergies  Family History  Problem Relation Age of Onset   Cancer Mother        had uncertain type of cancer   Stroke Sister     BP 124/76 (BP Location: Right Arm, Patient Position: Sitting, Cuff Size: Large)   Pulse 99   Ht 5\' 11"  (1.803 m)   Wt 242 lb 9.6 oz (110 kg)   SpO2 96%   BMI 33.84 kg/m    Review of Systems     Objective:   Physical Exam GENERAL: no distress Pulses: dorsalis pedis intact bilat.   MSK: no deformity of the feet CV: trace bilat leg edema Skin:  no ulcer on the feet.  normal color and temp on the feet. Neuro: sensation is intact to touch on the feet.     Lab Results  Component Value Date   HGBA1C 5.7 (A) 02/09/2021       Assessment & Plan:  Type 2 DM: overcontrolled  Patient Instructions  Please reduce the repaglinide to supper only.  check your blood sugar once a day.  vary the time of day when you check, between before the 3 meals, and at bedtime.  also check if you have symptoms of your blood sugar being too high or too low.  please keep a record of the readings and bring it to your next appointment here (or you can bring the meter itself).  You can write it on any piece of paper.  please call 02/11/2021 sooner if your blood sugar goes below 70, or if you have a lot of readings over 200.   Please come back for a follow-up appointment in 4 months.

## 2021-02-09 NOTE — Patient Instructions (Addendum)
Please reduce the repaglinide to supper only.  check your blood sugar once a day.  vary the time of day when you check, between before the 3 meals, and at bedtime.  also check if you have symptoms of your blood sugar being too high or too low.  please keep a record of the readings and bring it to your next appointment here (or you can bring the meter itself).  You can write it on any piece of paper.  please call us sooner if your blood sugar goes below 70, or if you have a lot of readings over 200.   Please come back for a follow-up appointment in 4 months.

## 2021-02-22 ENCOUNTER — Ambulatory Visit (HOSPITAL_COMMUNITY)
Admission: EM | Admit: 2021-02-22 | Discharge: 2021-02-22 | Disposition: A | Discharge status: Home / Self Care | Payer: Medicare Other | Attending: Urgent Care | Admitting: Urgent Care

## 2021-02-22 ENCOUNTER — Encounter (HOSPITAL_COMMUNITY): Payer: Self-pay

## 2021-02-22 ENCOUNTER — Other Ambulatory Visit: Payer: Self-pay

## 2021-02-22 DIAGNOSIS — L089 Local infection of the skin and subcutaneous tissue, unspecified: Secondary | ICD-10-CM | POA: Diagnosis not present

## 2021-02-22 DIAGNOSIS — L729 Follicular cyst of the skin and subcutaneous tissue, unspecified: Secondary | ICD-10-CM

## 2021-02-22 MED ORDER — LIDOCAINE-EPINEPHRINE 1 %-1:100000 IJ SOLN
INTRAMUSCULAR | Status: AC
  Filled 2021-02-22: qty 1

## 2021-02-22 MED ORDER — DOXYCYCLINE HYCLATE 100 MG PO CAPS: 100.0000 mg | ORAL_CAPSULE | Freq: Two times a day (BID) | ORAL | 0 refills | Status: DC

## 2021-02-22 NOTE — ED Triage Notes (Signed)
Pt presents with recurrent abscess on upper right side of back.

## 2021-02-22 NOTE — ED Provider Notes (Signed)
[facility]   MRN: [id number] DOB: [birth year]  Subjective:   Roberto Rivera is a 70 y.o. male presenting for recurrent infected cyst of his right upper back.  Patient states that he first noticed it 5 years ago and since then has intermittently increased in size and become infected.  He has had it lanced once.  Has not seen a dermatologist for this.  Denies fever, nausea, vomiting, spontaneous drainage of pus or bleeding.  No current facility-administered medications for this encounter.  Current Outpatient Medications:    Accu-Chek FastClix Lancets MISC, USE TO CHECK BLOOD SUGAR AS DIRECTED ONCE DAILY, Disp: 102 each, Rfl: 2   apixaban (ELIQUIS) 5 MG TABS tablet, Take 1 tablet (5 mg total) by mouth 2 (two) times daily., Disp: 180 tablet, Rfl: 3   Candesartan Cilexetil-HCTZ 32-25 MG TABS, TAKE 1 TABLET BY MOUTH EVERY DAY, Disp: 90 tablet, Rfl: 1   cetirizine (ZYRTEC) 10 MG tablet, Take 1 tablet (10 mg total) by mouth daily., Disp: 30 tablet, Rfl: 11   diltiazem (CARDIZEM CD) 180 MG 24 hr capsule, Take 1 capsule (180 mg total) by mouth daily., Disp: 90 capsule, Rfl: 3   glucose blood (ACCU-CHEK GUIDE) test strip, 1 each by Other route daily. And lancets 1/day, Disp: 100 strip, Rfl: 0   hydrALAZINE (APRESOLINE) 50 MG tablet, Take 1 tablet (50 mg total) by mouth 3 (three) times daily., Disp: 90 tablet, Rfl: 11   metoprolol tartrate (LOPRESSOR) 50 MG tablet, TAKE 1 TABLET(50 MG) BY MOUTH TWICE DAILY, Disp: 180 tablet, Rfl: 3   nitroGLYCERIN (NITROSTAT) 0.4 MG SL tablet, Place 1 tablet (0.4 mg total) under the tongue every 5 (five) minutes as needed for chest pain., Disp: 90 tablet, Rfl: 3   repaglinide (PRANDIN) 1 MG tablet, Take 1 tablet (1 mg total) by mouth daily with supper., Disp: 90 tablet, Rfl: 3   rosuvastatin (CRESTOR) 5 MG tablet, Take 1 tablet (5 mg total) by mouth at bedtime. 1 tab by mouth once daily, Disp: 90 tablet, Rfl: 3   triamcinolone (NASACORT) 55 MCG/ACT AERO  nasal inhaler, Place 2 sprays into the nose daily. (Patient not taking: Reported on 02/09/2021), Disp: 1 each, Rfl: 12   No Known Allergies  Past Medical History:  Diagnosis Date   ALLERGIC RHINITIS 04/01/2007   Diabetes mellitus    DIABETES MELLITUS, TYPE II 04/01/2007   ERECTILE DYSFUNCTION, ORGANIC 02/07/2008   Hypercholesteremia    HYPERCHOLESTEROLEMIA 02/07/2008   Hypertension    HYPERTENSION 04/01/2007   MICROCYTOSIS 02/07/2008   OSTEOARTHRITIS, SPINE 02/07/2008   PAF (paroxysmal atrial fibrillation) (HCC) 07/11/2020   Stroke (HCC)    THYROID CYST 02/07/2008     Past Surgical History:  Procedure Laterality Date   LEFT HEART CATH AND CORONARY ANGIOGRAPHY N/A 09/28/2020   Procedure: LEFT HEART CATH AND CORONARY ANGIOGRAPHY;  Surgeon: Elder Negus, MD;  Location: [facility];  Service: Cardiovascular;  Laterality: N/A;    Family History  Problem Relation Age of Onset   Cancer Mother        had uncertain type of cancer   Stroke Sister     Social History   Tobacco Use   Smoking status: Former    Packs/day: 1.00    Pack years: 0.00    Types: Cigarettes    Quit date: 06/23/2020    Years since quitting: 0.6   Smokeless tobacco: Never  Vaping Use   Vaping Use: Never used  Substance Use Topics  Alcohol use: No    Alcohol/week: 0.0 standard drinks   Drug use: No    ROS   Objective:   Vitals: BP 136/84 (BP Location: Right Arm)   Pulse 66   Temp 99.1 F (37.3 C) (Oral)   Resp 17   SpO2 94%   Physical Exam Constitutional:      General: He is not in acute distress.    Appearance: Normal appearance. He is well-developed and normal weight. He is not ill-appearing, toxic-appearing or diaphoretic.  HENT:     Head: Normocephalic and atraumatic.     Right Ear: External ear normal.     Left Ear: External ear normal.     Nose: Nose normal.     Mouth/Throat:     Pharynx: Oropharynx is clear.  Eyes:     General: No scleral icterus.       Right eye: No  discharge.        Left eye: No discharge.     Extraocular Movements: Extraocular movements intact.     Pupils: Pupils are equal, round, and reactive to light.  Cardiovascular:     Rate and Rhythm: Normal rate.  Pulmonary:     Effort: Pulmonary effort is normal.  Musculoskeletal:     Cervical back: Normal range of motion.  Skin:      Neurological:     Mental Status: He is alert and oriented to person, place, and time.  Psychiatric:        Mood and Affect: Mood normal.        Behavior: Behavior normal.        Thought Content: Thought content normal.        Judgment: Judgment normal.    PROCEDURE NOTE: I&D of Abscess Verbal consent obtained. Local anesthesia with 4cc of 1% lidocaine with epinephrine. Site cleansed with Betadine. Incision of 1cm was made using an 11 blade, 5cc expressed consisting of a mixture of pus and serosanguinous fluid, sebaceous cyst material. Wound cavity was explored with curved hemostats and loculations loosened. Cleansed and dressed.   Assessment and Plan :   PDMP not reviewed this encounter.  1. Infected cyst of skin     Patient with multiple sebaceous cysts 1 of which is infected.  I did an incision and drainage of the infected cyst but it is not appropriate in this setting to attempt excision of multiple cysts.  Recommended started doxycycline to address his infection, discussed wound care.  Follow-up with dermatologist soon as possible.  Tylenol for pain control. Counseled patient on potential for adverse effects with medications prescribed/recommended today, ER and return-to-clinic precautions discussed, patient verbalized understanding.    Wallis Bamberg, [place] 02/22/21 [phone]

## 2021-02-22 NOTE — Discharge Instructions (Addendum)
Please just use Tylenol at a dose of 500mg -650mg  once every 6 hours as needed for your aches, pains, fevers.  Do not use any nonsteroidal anti-inflammatories (NSAIDs) like ibuprofen, Motrin, naproxen, Aleve, etc. which are all available over-the-counter.     Please change your dressing 3-5 times daily. Do not apply any ointments or creams. Each time you change your dressing, make sure that you are pressing on the wound to get pus to come out.  Try your best to have a family member help you clean gently around the perimeter of the wound with gentle soap and warm water. Pat your wound dry and let it air out if possible to make sure it is dry before reapplying another dressing.

## 2021-02-23 DIAGNOSIS — L720 Epidermal cyst: Secondary | ICD-10-CM | POA: Diagnosis not present

## 2021-03-11 ENCOUNTER — Other Ambulatory Visit: Payer: Self-pay | Admitting: Endocrinology

## 2021-03-18 ENCOUNTER — Other Ambulatory Visit: Payer: Self-pay

## 2021-03-18 MED ORDER — METOPROLOL TARTRATE 50 MG PO TABS: ORAL_TABLET | ORAL | 3 refills | Status: DC

## 2021-03-18 NOTE — Telephone Encounter (Signed)
Fax from [facility] requesting refill for metoprolol. Refilled medication.

## 2021-03-23 ENCOUNTER — Ambulatory Visit: Payer: Medicare Other | Admitting: Podiatry

## 2021-03-28 DIAGNOSIS — L720 Epidermal cyst: Secondary | ICD-10-CM | POA: Diagnosis not present

## 2021-04-07 ENCOUNTER — Telehealth: Payer: Self-pay | Admitting: Lab

## 2021-04-07 NOTE — Chronic Care Management (AMB) (Signed)
  Chronic Care Management   Note  04/07/2021 Name: Roberto Rivera MRN: [id number] DOB: [birth year]  Roberto Rivera is a 70 y.o. year old male who is a primary care patient of Corwin Levins, MD. I reached out to [name] by phone today in response to a referral sent by Roberto Rivera PCP, Corwin Levins, MD.   Roberto Rivera was given information about Chronic Care Management services today including:  CCM service includes personalized support from designated clinical staff supervised by his physician, including individualized plan of care and coordination with other care providers 24/7 contact phone numbers for assistance for urgent and routine care needs. Service will only be billed when office clinical staff spend 20 minutes or more in a month to coordinate care. Only one practitioner may furnish and bill the service in a calendar month. The patient may stop CCM services at any time (effective at the end of the month) by phone call to the office staff.   Patient agreed to services and verbal consent obtained.   Follow up plan:   Carilyn Goodpasture  Upstream Scheduler

## 2021-04-12 NOTE — Progress Notes (Signed)
Primary Physician/Referring:  Corwin Levins, MD  Patient ID: [facility], male    DOB: 08/07/1951, 70 y.o.   MRN: [id number]  Chief Complaint  Patient presents with   Coronary Artery Disease   Follow-up   Atrial Fibrillation   HPI:    Roberto [facility]  is a 70 y.o. AA male with history of type 2 diabetes, stage III chronic kidney disease, hypertension, hyperlipidemia, and tobacco use (smokes pack/day), and paroxysmal atrial fibrillation. He presented to [facility] ED 06/30/2020 with complaints of left hand numbness and weakness. He was diagnosed with small acute cerebral infarct. Cardiac monitoring revealed atrial fibrillation as the likely etiology, he is not tolerating long-term anticoagulation.  Patient underwent high risk nuclear stress testing 07/2020, and on 09/28/2020 repeat cardiac catheterization and angiography revealed normal coronary arteries with the exception of RCA disease, recommended medical management.  Patient presents for 6 follow-up.  Last office visit patient was doing well and no changes were made.  Unfortunately since last visit patient has gained about 5 pounds and has been relatively inactive, without formal exercise routine.  Patient reports medication compliance.  Denies chest pain, palpitations, dyspnea, leg swelling, orthopnea, PND.  However he does note snoring, difficulty sleeping, and daytime fatigue, concerning for underlying sleep apnea however he does admit to poor sleep hygiene.  Past Medical History:  Diagnosis Date   ALLERGIC RHINITIS 04/01/2007   Diabetes mellitus    DIABETES MELLITUS, TYPE II 04/01/2007   ERECTILE DYSFUNCTION, ORGANIC 02/07/2008   Hypercholesteremia    HYPERCHOLESTEROLEMIA 02/07/2008   Hypertension    HYPERTENSION 04/01/2007   MICROCYTOSIS 02/07/2008   OSTEOARTHRITIS, SPINE 02/07/2008   PAF (paroxysmal atrial fibrillation) (HCC) 07/11/2020   Stroke (HCC)    THYROID CYST 02/07/2008   Past Surgical History:  Procedure Laterality Date   cyst  removal  01/2021   back   LEFT HEART CATH AND CORONARY ANGIOGRAPHY N/A 09/28/2020   Procedure: LEFT HEART CATH AND CORONARY ANGIOGRAPHY;  Surgeon: Elder Negus, MD;  Location: [facility] INVASIVE CV LAB;  Service: Cardiovascular;  Laterality: N/A;   Family History  Problem Relation Age of Onset   Cancer Mother        had uncertain type of cancer   Stroke Sister     Social History   Tobacco Use   Smoking status: Former    Packs/day: 1.00    Types: Cigarettes    Quit date: 06/23/2020    Years since quitting: 0.8   Smokeless tobacco: Never  Substance Use Topics   Alcohol use: No    Alcohol/week: 0.0 standard drinks   Marital Status: Married   ROS  Review of Systems  Constitutional: Positive for malaise/fatigue and weight gain.  Cardiovascular:  Negative for chest pain, claudication, leg swelling, near-syncope, orthopnea, palpitations, paroxysmal nocturnal dyspnea and syncope.  Respiratory:  Negative for shortness of breath.   Hematologic/Lymphatic: Does not bruise/bleed easily.  Gastrointestinal:  Negative for melena.  Neurological:  Negative for dizziness and weakness.   Objective  Blood pressure (!) 152/81, pulse (!) 52, temperature 97.8 F (36.6 C), height 5\' 11"  (1.803 m), weight 245 lb (111.1 kg), SpO2 97 %.  Vitals with BMI 04/13/2021 04/13/2021 02/22/2021  Height - 5\' 11"  -  Weight - 245 lbs -  BMI - 34.19 -  Systolic 152 162 04/25/2021  Diastolic 81 84 84  Pulse 52 57 66      Physical Exam Vitals reviewed.  Constitutional:      Appearance: He is obese.  HENT:     Head: Normocephalic and atraumatic.  Cardiovascular:     Rate and Rhythm: Normal rate and regular rhythm.     Pulses: Intact distal pulses.     Heart sounds: S1 normal and S2 normal. No murmur heard.   No gallop.     Comments: No JVD.  Pulmonary:     Effort: Pulmonary effort is normal. No respiratory distress.     Breath sounds: No wheezing, rhonchi or rales.  Musculoskeletal:     Right lower leg: No  edema.     Left lower leg: No edema.  Skin:    General: Skin is warm and dry.  Neurological:     General: No focal deficit present.     Mental Status: He is alert and oriented to person, place, and time.   Laboratory examination:   Recent Labs    07/01/20 0314 07/04/20 1037 07/06/20 1614 09/01/20 1006 09/28/20 1040  NA 138 137 137 138 138  K 3.1* 3.5 3.3* 3.7 3.1*  CL 104 104 98 100 102  CO2 25 23 29  33* 24  GLUCOSE 121* 84 75 89 117*  BUN 16 16 21  35* 24*  CREATININE 1.52* 1.40* 1.60* 1.71* 1.53*  CALCIUM 8.9 9.0 10.0 9.8 9.1  GFRNONAA 50* 55*  --   --  49*   CrCl cannot be calculated (Patient's most recent lab result is older than the maximum 21 days allowed.).  CMP Latest Ref Rng & Units 09/28/2020 09/01/2020 07/06/2020  Glucose 70 - 99 mg/dL 10/30/2020) 89 75  BUN 8 - 23 mg/dL 07/08/2020) 921(J) 21  Creatinine 0.61 - 1.24 mg/dL 94(R) 74(Y) 8.14(G)  Sodium 135 - 145 mmol/L 138 138 137  Potassium 3.5 - 5.1 mmol/L 3.1(L) 3.7 3.3(L)  Chloride 98 - 111 mmol/L 102 100 98  CO2 22 - 32 mmol/L 24 33(H) 29  Calcium 8.9 - 10.3 mg/dL 9.1 9.8 8.18(H  Total Protein 6.0 - 8.3 g/dL - 7.2 8.3  Total Bilirubin 0.2 - 1.2 mg/dL - 0.6 0.5  Alkaline Phos 39 - 117 U/L - 92 100  AST 0 - 37 U/L - 14 25  ALT 0 - 53 U/L - 12 21   CBC Latest Ref Rng & Units 09/01/2020 07/06/2020 07/04/2020  WBC 4.0 - 10.5 K/uL 9.4 7.2 6.9  Hemoglobin 13.0 - 17.0 g/dL 07/08/2020 07/06/2020 26.3  Hematocrit 39.0 - 52.0 % 45.8 52.1(H) 48.5  Platelets 150.0 - 400.0 K/uL 216.0 210.0 210    Lipid Panel Recent Labs    07/01/20 0314 09/01/20 1006  CHOL 79 100  TRIG 72 66.0  LDLCALC 39 48  VLDL 14 13.2  HDL 26* 39.10  CHOLHDL 3.0 3    HEMOGLOBIN A1C Lab Results  Component Value Date   HGBA1C 5.7 (A) 02/09/2021   MPG 162.81 06/30/2020   TSH Recent Labs    09/01/20 1006  TSH 1.66    External labs:  None   Allergies  No Known Allergies   Medications Prior to Visit:   Outpatient Medications Prior to Visit   Medication Sig Dispense Refill   Accu-Chek FastClix Lancets MISC USE TO CHECK BLOOD SUGAR AS DIRECTED ONCE DAILY 102 each 2   Accu-Chek FastClix Lancets MISC USE TO CHECK BLOOD SUGAR AS DIRECTED ONCE DAILY 102 each 2   apixaban (ELIQUIS) 5 MG TABS tablet Take 1 tablet (5 mg total) by mouth 2 (two) times daily. 180 tablet 3   Candesartan Cilexetil-HCTZ 32-25 MG TABS TAKE 1 TABLET BY  MOUTH EVERY DAY 90 tablet 1   diltiazem (CARDIZEM CD) 180 MG 24 hr capsule Take 1 capsule (180 mg total) by mouth daily. 90 capsule 3   glucose blood (ACCU-CHEK GUIDE) test strip 1 each by Other route daily. And lancets 1/day 100 strip 0   metoprolol tartrate (LOPRESSOR) 50 MG tablet TAKE 1 TABLET(50 MG) BY MOUTH TWICE DAILY 180 tablet 3   nitroGLYCERIN (NITROSTAT) 0.4 MG SL tablet Place 1 tablet (0.4 mg total) under the tongue every 5 (five) minutes as needed for chest pain. 90 tablet 3   repaglinide (PRANDIN) 1 MG tablet Take 1 tablet (1 mg total) by mouth daily with supper. 90 tablet 3   rosuvastatin (CRESTOR) 5 MG tablet Take 1 tablet (5 mg total) by mouth at bedtime. 1 tab by mouth once daily 90 tablet 3   hydrALAZINE (APRESOLINE) 50 MG tablet Take 1 tablet (50 mg total) by mouth 3 (three) times daily. 90 tablet 11   cetirizine (ZYRTEC) 10 MG tablet Take 1 tablet (10 mg total) by mouth daily. (Patient not taking: Reported on 04/13/2021) 30 tablet 11   doxycycline (VIBRAMYCIN) 100 MG capsule Take 1 capsule (100 mg total) by mouth 2 (two) times daily. 20 capsule 0   metoprolol succinate (TOPROL-XL) 50 MG 24 hr tablet Take 50 mg by mouth daily.     triamcinolone (NASACORT) 55 MCG/ACT AERO nasal inhaler Place 2 sprays into the nose daily. (Patient not taking: Reported on 02/09/2021) 1 each 12   No facility-administered medications prior to visit.   Final Medications at End of Visit    Current Meds  Medication Sig   Accu-Chek FastClix Lancets MISC USE TO CHECK BLOOD SUGAR AS DIRECTED ONCE DAILY   Accu-Chek FastClix  Lancets MISC USE TO CHECK BLOOD SUGAR AS DIRECTED ONCE DAILY   apixaban (ELIQUIS) 5 MG TABS tablet Take 1 tablet (5 mg total) by mouth 2 (two) times daily.   Candesartan Cilexetil-HCTZ 32-25 MG TABS TAKE 1 TABLET BY MOUTH EVERY DAY   diltiazem (CARDIZEM CD) 180 MG 24 hr capsule Take 1 capsule (180 mg total) by mouth daily.   glucose blood (ACCU-CHEK GUIDE) test strip 1 each by Other route daily. And lancets 1/day   isosorbide-hydrALAZINE (BIDIL) 20-37.5 MG tablet Take 1 tablet by mouth 3 (three) times daily.   metoprolol tartrate (LOPRESSOR) 50 MG tablet TAKE 1 TABLET(50 MG) BY MOUTH TWICE DAILY   nitroGLYCERIN (NITROSTAT) 0.4 MG SL tablet Place 1 tablet (0.4 mg total) under the tongue every 5 (five) minutes as needed for chest pain.   repaglinide (PRANDIN) 1 MG tablet Take 1 tablet (1 mg total) by mouth daily with supper.   rosuvastatin (CRESTOR) 5 MG tablet Take 1 tablet (5 mg total) by mouth at bedtime. 1 tab by mouth once daily   [DISCONTINUED] hydrALAZINE (APRESOLINE) 50 MG tablet Take 1 tablet (50 mg total) by mouth 3 (three) times daily.    Radiology:   No results found.  Cardiac Studies:   Left heart cath and coronary angiography 09/28/2020:  LM: Large, normal LAD: Large, minimal luminal irregularities Ramus: Large, normal LCx: Large, normal RCA:  Prox calcific 50% stenosis, distal RCA subtotal occlusion with TIMI 1 flow   PCV MYOCARDIAL PERFUSION WO LEXISCAN 08/04/2020 Nondiagnostic ECG stress due to pharmacologic stress. Resting EKG demonstrated atrial fibrillation with rapid ventricular response. Non-specific ST-T abnormality. Peak EKG/ demonstrated atrial fibrillation with rapid ventricular response. There is a reversible moderate defect in the lateral and inferior regions. consistent with severe  ischemia. Overall LV systolic function is abnormal with regional wall motion abnormalities in the same area.  Stress LV EF: 34%. No previous exam available for comparison. High risk  study.  Venous Bilateral Ultrasound 07/02/2020: RIGHT:  - There is no evidence of deep vein thrombosis in the lower extremity.  - No cystic structure found in the popliteal fossa.  LEFT:  - There is no evidence of deep vein thrombosis in the lower extremity.  - No cystic structure found in the popliteal fossa.  ECHOCARDIOGRAM COMPLETE 07/01/2020 1. Left ventricular ejection fraction, by estimation, is 60 to 65%. The left ventricle has normal function. The left ventricle has no regional wall motion abnormalities. There is mild left ventricular hypertrophy. Left ventricular diastolic parameters are consistent with Grade I diastolic dysfunction (impaired relaxation).   2. Right ventricular systolic function is normal. The right ventricular size is normal. Tricuspid regurgitation signal is inadequate for assessing PA pressure.   3. Left atrial size was severely dilated.   4. The mitral valve is normal in structure. No evidence of mitral valve regurgitation. No evidence of mitral stenosis. There was chordal systolic anterior motion noted (not valvular).  5. The aortic valve is tricuspid. Aortic valve regurgitation is mild. Mild aortic valve stenosis. Aortic valve mean gradient measures 14.0 mmHg.   6. Aortic dilatation noted. There is mild dilatation of the ascending aorta, measuring 40 mm.  7. The inferior vena cava is normal in size with greater than 50% respiratory variability, suggesting right atrial pressure of 3 mmHg.   VAS US CAROTID 07/01/2020 Right Carotid: Velocities in the right ICA are consistent with a 1-39% stenosis.  Left Carotid: Velocities in the left ICA are consistent with a 1-39% stenosis.  Vertebrals:  Bilateral vertebral arteries demonstrate antegrade flow.  Subclavians: Normal flow hemodynamics were seen in bilateral subclavian arteries.   EKG:  04/13/2021: Sinus bradycardia at a rate of 51 bpm.  Left atrial enlargement.  Left axis, left anterior fascicular block.   Nonspecific T wave abnormality.  Compared to EKG 09/20/2020, no poor R wave progression  07/12/2020: sinus bradycardia at a rate of 57 bpm with borderline 1st degree AV block, left atrial enlargement. Normal axis.  Nonspecific T wave abnormality.  07/02/2020: Sinus rhythm at a rate of 72 bpm, normal axis.  T wave inversions lateral leads, cannot exclude ischemia.   07/01/2020: Atrial fibrillation with rapid ventricular response at a rate of 115 bpm.  Normal axis.  Assessment     ICD-10-CM   1. Coronary artery disease involving native coronary artery of native heart without angina pectoris  I25.10 EKG 12-Lead    2. Essential hypertension  I10     3. PAF (paroxysmal atrial fibrillation) (HCC)  I48.0        Medications Discontinued During This Encounter  Medication Reason   doxycycline (VIBRAMYCIN) 100 MG capsule Error   metoprolol succinate (TOPROL-XL) 50 MG 24 hr tablet Error   triamcinolone (NASACORT) 55 MCG/ACT AERO nasal inhaler Error   cetirizine (ZYRTEC) 10 MG tablet Error   hydrALAZINE (APRESOLINE) 50 MG tablet Change in therapy    Meds ordered this encounter  Medications   isosorbide-hydrALAZINE (BIDIL) 20-37.5 MG tablet    Sig: Take 1 tablet by mouth 3 (three) times daily.    Dispense:  90 tablet    Refill:  3   This patients CHA2DS2-VASc Score 5 (HTN, DM, Age, Stroke) and yearly risk of stroke 7.2%.   Recommendations:   Roberto ClevelandRoy Vanduyne is a 70 y.o. .Marland Kitchen  AA male with history of type 2 diabetes, stage III chronic kidney disease, hypertension, hyperlipidemia, and tobacco use, and paroxysmal atrial fibrillation. Patient underwent high risk nuclear stress testing 07/2020, and is a current cardiac catheterization and angiography revealed normal coronary arteries with the exception of RCA disease, recommended medical management.  Patient presents for 91-month follow-up.  He is tolerating present medications without issue and remains relatively asymptomatic.  However he does report poor  sleep apnea as well as snoring and daytime fatigue.  Counseled patient regarding sleep habits and we will plan to reevaluate need for sleep apnea referral and evaluation and next office visit.  In regard to hypertension, blood pressure is uncontrolled.  We will switch patient from hydralazine to BiDil 1 tablet 3 times daily.  Patient will continue to monitor his blood pressure on a daily basis and bring written log to his next appointment.  In regard to atrial fibrillation, he is tolerating anticoagulation without bleeding diathesis, will continue this.  Discussed at length with patient regarding the importance of diet and lifestyle modifications, particularly weight loss.  Suspect weight gain has contributed to uncontrolled hypertension.  Follow-up in 6 weeks, sooner if needed, for hypertension and fatigue.   Rayford Halsted, PA-C 04/13/2021, 9:25 AM Office: [phone]

## 2021-04-13 ENCOUNTER — Other Ambulatory Visit: Payer: Self-pay

## 2021-04-13 ENCOUNTER — Telehealth: Payer: Self-pay | Admitting: Student

## 2021-04-13 ENCOUNTER — Ambulatory Visit: Payer: Medicare Other | Admitting: Student

## 2021-04-13 ENCOUNTER — Ambulatory Visit: Payer: Medicare Other | Admitting: Podiatry

## 2021-04-13 ENCOUNTER — Encounter: Payer: Self-pay | Admitting: Student

## 2021-04-13 VITALS — BP 152/81 | HR 52 | Temp 97.8°F | Ht 71.0 in | Wt 245.0 lb

## 2021-04-13 DIAGNOSIS — B351 Tinea unguium: Secondary | ICD-10-CM | POA: Diagnosis not present

## 2021-04-13 DIAGNOSIS — I10 Essential (primary) hypertension: Secondary | ICD-10-CM | POA: Diagnosis not present

## 2021-04-13 DIAGNOSIS — L853 Xerosis cutis: Secondary | ICD-10-CM

## 2021-04-13 DIAGNOSIS — I2510 Atherosclerotic heart disease of native coronary artery without angina pectoris: Secondary | ICD-10-CM

## 2021-04-13 DIAGNOSIS — D689 Coagulation defect, unspecified: Secondary | ICD-10-CM

## 2021-04-13 DIAGNOSIS — M79675 Pain in left toe(s): Secondary | ICD-10-CM

## 2021-04-13 DIAGNOSIS — I480 Paroxysmal atrial fibrillation: Secondary | ICD-10-CM | POA: Diagnosis not present

## 2021-04-13 DIAGNOSIS — M79674 Pain in right toe(s): Secondary | ICD-10-CM | POA: Diagnosis not present

## 2021-04-13 MED ORDER — ISOSORB DINITRATE-HYDRALAZINE 20-37.5 MG PO TABS: 1.0000 | ORAL_TABLET | Freq: Three times a day (TID) | ORAL | 3 refills | Status: DC

## 2021-04-13 MED ORDER — AMMONIUM LACTATE 12 % EX LOTN: 1.0000 "application " | TOPICAL_LOTION | CUTANEOUS | 0 refills | Status: DC | PRN

## 2021-04-13 MED ORDER — ISOSORBIDE DINITRATE 20 MG PO TABS
20.0000 mg | ORAL_TABLET | Freq: Three times a day (TID) | ORAL | 0 refills | Status: DC
Start: 2021-04-13 — End: 2021-07-19

## 2021-04-13 MED ORDER — HYDRALAZINE HCL 50 MG PO TABS
50.0000 mg | ORAL_TABLET | Freq: Three times a day (TID) | ORAL | 0 refills | Status: DC
Start: 2021-04-13 — End: 2021-05-25

## 2021-04-13 NOTE — Telephone Encounter (Signed)
Call patient. I can split the 2 medications into separate pills. If he is okay with that you can send for hydralazine 50 mg TID and isosorbide dinitrate 20 mg TID. That may be more cost effective.

## 2021-04-13 NOTE — Telephone Encounter (Signed)
Patient returned our call he is okay with splitting rx. Medications have been sent to pt pharmacy

## 2021-04-13 NOTE — Telephone Encounter (Signed)
Pt says he was prescribed BP medication that would cost $100; pt wants to know if there are any alternatives to this medication that may be more budget-friendly. Phone # [phone].

## 2021-04-13 NOTE — Telephone Encounter (Signed)
Attempted to call pt, no answer. Left vm requesting call back.

## 2021-04-14 ENCOUNTER — Encounter: Payer: Self-pay | Admitting: Podiatry

## 2021-04-14 NOTE — Progress Notes (Signed)
  Subjective:  Patient ID: Roberto [facility], male    DOB: 1951/05/02,  MRN: [id number]  Chief Complaint  Patient presents with   Nail Problem    Nail trim    70 y.o. male returns for the above complaint.  Patient presents with thickened elongated dystrophic toenails x10.  Patient is on blood thinner.  He would like to have the nails debrided down as he is not able to do it himself.  He states is mildly painful to touch.  He has secondary complaint of dry skin to both lower extremity.  He would like to discuss this and treated.  He is tried over-the-counter medication which has not helped  Objective:  There were no vitals filed for this visit. Podiatric Exam: Vascular: dorsalis pedis and posterior tibial pulses are palpable bilateral. Capillary return is immediate. Temperature gradient is WNL. Skin turgor WNL  Sensorium: Normal Semmes Weinstein monofilament test. Normal tactile sensation bilaterally. Nail Exam: Pt has thick disfigured discolored nails with subungual debris noted bilateral entire nail hallux through fifth toenails.  Pain on palpation to the nails. Ulcer Exam: There is no evidence of ulcer or pre-ulcerative changes or infection. Orthopedic Exam: Muscle tone and strength are WNL. No limitations in general ROM. No crepitus or effusions noted. HAV  B/L.  Hammer toes 2-5  B/L. Skin: No Porokeratosis. No infection or ulcers.  Dry skin noted to both plantar foot.  No signs of fissure noted.  No open ulcers noted    Assessment & Plan:   1. Xerosis of skin   2. Coagulation defect (HCC)   3. Pain due to onychomycosis of toenails of both feet      Patient was evaluated and treated and all questions answered.  Xerosis skin bilateral plantar -I explained to the patient the etiology of xerosis and various treatment options were extensively discussed.  I explained to the patient the importance of maintaining moisturization of the skin with application of over-the-counter lotion such as  Eucerin or Luciderm.  Given that his failed over-the-counter lotion.  He would benefit from ammonium lactate.  I have asked him to apply twice a day.  Ammonium lactate was sent to the pharmacy.   Onychomycosis with pain  -Nails palliatively debrided as below. -Educated on self-care  Procedure: Nail Debridement Rationale: pain  Type of Debridement: manual, sharp debridement. Instrumentation: Nail nipper, rotary burr. Number of Nails: 10  Procedures and Treatment: Consent by patient was obtained for treatment procedures. The patient understood the discussion of treatment and procedures well. All questions were answered thoroughly reviewed. Debridement of mycotic and hypertrophic toenails, 1 through 5 bilateral and clearing of subungual debris. No ulceration, no infection noted.  Return Visit-Office Procedure: Patient instructed to return to the office for a follow up visit 3 months for continued evaluation and treatment.  Nicholes Rough, DPM    No follow-ups on file.

## 2021-05-10 ENCOUNTER — Telehealth: Payer: Self-pay

## 2021-05-10 NOTE — Progress Notes (Signed)
Chronic Care Management Pharmacy Assistant   Name: Roberto Rivera  MRN: [id number] DOB: 1950-10-12  Roberto Rivera is an 70 y.o. year old male who presents for his initial CCM visit with the clinical pharmacist.   Recent office visits:  N/A  Recent consult visits:  04/13/21 Roberto Rivera DPM Podiatry- pt was seen for Xerosis of skin. Pt had debridement and started on Ammonium Lactate 12 %. Follow up in 3 months  04/13/21 Roberto [facility] PA-C Cardiology- pt was seen for Coronary artery disease involving native coronary artery of native heart without angina pectoris. EKG was performed and pt started Isosorb Dinitrate-hydralazine 20-37.5 TID. Follow up in 6 weeks.  02/09/21 Roberto Belling MD Endocrinology- pt was seen for Type 2 DM. Labs were ordered and Reaglinide 1 mg daily with supper. Follow up 4 months.  [facility] visits:  Medication Reconciliation was completed by comparing discharge summary, patient's EMR and Pharmacy list, and upon discussion with patient.  Admitted to the [facility] on 02/22/21 due to Infected Cyst of skin. Discharge date was 02/22/21. Discharged from [facility].    New?Medications Started at [facility] Discharge:?? -started Tylenol 500-600 mg q6h  Medication Changes at [facility]charge: -Changed none  Medications Discontinued at [facility] Discharge: -Stopped none  Medications that remain the same after [facility] Discharge:??  -All other medications will remain the same.    Medications: Outpatient Encounter Medications as of 05/10/2021  Medication Sig Note   Accu-Chek FastClix Lancets MISC USE TO CHECK BLOOD SUGAR AS DIRECTED ONCE DAILY    Accu-Chek FastClix Lancets MISC USE TO CHECK BLOOD SUGAR AS DIRECTED ONCE DAILY    ammonium lactate (AMLACTIN) 12 % lotion Apply 1 application topically as needed for dry skin.    apixaban (ELIQUIS) 5 MG TABS tablet Take 1 tablet (5 mg total) by mouth 2 (two) times daily.    Candesartan Cilexetil-HCTZ 32-25 MG TABS TAKE 1 TABLET  BY MOUTH EVERY DAY    diltiazem (CARDIZEM CD) 180 MG 24 hr capsule Take 1 capsule (180 mg total) by mouth daily.    glucose blood (ACCU-CHEK GUIDE) test strip 1 each by Other route daily. And lancets 1/day    hydrALAZINE (APRESOLINE) 50 MG tablet Take 1 tablet (50 mg total) by mouth 3 (three) times daily.    isosorbide dinitrate (ISORDIL) 20 MG tablet Take 1 tablet (20 mg total) by mouth 3 (three) times daily.    metoprolol tartrate (LOPRESSOR) 50 MG tablet TAKE 1 TABLET(50 MG) BY MOUTH TWICE DAILY    nitroGLYCERIN (NITROSTAT) 0.4 MG SL tablet Place 1 tablet (0.4 mg total) under the tongue every 5 (five) minutes as needed for chest pain. 09/28/2020: Never taken    repaglinide (PRANDIN) 1 MG tablet Take 1 tablet (1 mg total) by mouth daily with supper.    rosuvastatin (CRESTOR) 5 MG tablet Take 1 tablet (5 mg total) by mouth at bedtime. 1 tab by mouth once daily    No facility-administered encounter medications on file as of 05/10/2021.   Current Medication List  ammonium lactate (AMLACTIN) 12 % lotion  Candesartan Cilexetil-HCTZ 32-25 MG TABS last filled 04/28/21 90 DS diltiazem (CARDIZEM CD) 180 MG 24 hr capsule last filled 02/27/21 90 DS hydrALAZINE (APRESOLINE) 50 MG tablet last filled 04/14/21 90 DS isosorbide dinitrate (ISORDIL) 20 MG tablet last filled 04/14/21 90 DS metoprolol tartrate (LOPRESSOR) 50 MG tablet last filled 03/26/21 90 DS repaglinide (PRANDIN) 1 MG tablet last filled 02/14/21 90 DS rosuvastatin (CRESTOR) 5 MG tablet last filled 02/27/21 90  DS  Roberto [facility] CMA Clinical Pharmacist Assistant [phone]

## 2021-05-11 ENCOUNTER — Ambulatory Visit (INDEPENDENT_AMBULATORY_CARE_PROVIDER_SITE_OTHER): Payer: Medicare Other | Admitting: Internal Medicine

## 2021-05-11 ENCOUNTER — Other Ambulatory Visit: Payer: Self-pay

## 2021-05-11 ENCOUNTER — Encounter: Payer: Self-pay | Admitting: Internal Medicine

## 2021-05-11 VITALS — BP 130/68 | HR 59 | Temp 98.9°F | Ht 71.0 in | Wt 246.0 lb

## 2021-05-11 DIAGNOSIS — E785 Hyperlipidemia, unspecified: Secondary | ICD-10-CM

## 2021-05-11 DIAGNOSIS — I152 Hypertension secondary to endocrine disorders: Secondary | ICD-10-CM

## 2021-05-11 DIAGNOSIS — E1121 Type 2 diabetes mellitus with diabetic nephropathy: Secondary | ICD-10-CM | POA: Diagnosis not present

## 2021-05-11 DIAGNOSIS — E559 Vitamin D deficiency, unspecified: Secondary | ICD-10-CM

## 2021-05-11 DIAGNOSIS — E1159 Type 2 diabetes mellitus with other circulatory complications: Secondary | ICD-10-CM

## 2021-05-11 DIAGNOSIS — E1169 Type 2 diabetes mellitus with other specified complication: Secondary | ICD-10-CM

## 2021-05-11 DIAGNOSIS — N1831 Chronic kidney disease, stage 3a: Secondary | ICD-10-CM | POA: Diagnosis not present

## 2021-05-11 LAB — LIPID PANEL
Cholesterol: 113 mg/dL (ref 0–200)
HDL: 45.6 mg/dL (ref >39.00)
LDL Cholesterol: 56 mg/dL (ref 0–99)
NonHDL: 67.62
Total CHOL/HDL Ratio: 2
Triglycerides: 58 mg/dL (ref 0.0–149.0)
VLDL: 11.6 mg/dL (ref 0.0–40.0)

## 2021-05-11 LAB — BASIC METABOLIC PANEL
BUN: 32 mg/dL — ABNORMAL HIGH (ref 6–23)
CO2: 29 mEq/L (ref 19–32)
Calcium: 9.4 mg/dL (ref 8.4–10.5)
Chloride: 103 mEq/L (ref 96–112)
Creatinine, Ser: 1.57 mg/dL — ABNORMAL HIGH (ref 0.40–1.50)
GFR: 44.67 mL/min — ABNORMAL LOW (ref >60.00)
Glucose, Bld: 129 mg/dL — ABNORMAL HIGH (ref 70–99)
Potassium: 3.2 mEq/L — ABNORMAL LOW (ref 3.5–5.1)
Sodium: 139 mEq/L (ref 135–145)

## 2021-05-11 LAB — HEPATIC FUNCTION PANEL
ALT: 12 U/L (ref 0–53)
AST: 14 U/L (ref 0–37)
Albumin: 4 g/dL (ref 3.5–5.2)
Alkaline Phosphatase: 82 U/L (ref 39–117)
Bilirubin, Direct: 0.1 mg/dL (ref 0.0–0.3)
Total Bilirubin: 0.5 mg/dL (ref 0.2–1.2)
Total Protein: 7.5 g/dL (ref 6.0–8.3)

## 2021-05-11 LAB — VITAMIN D 25 HYDROXY (VIT D DEFICIENCY, FRACTURES): VITD: 24.6 ng/mL — ABNORMAL LOW (ref 30.00–100.00)

## 2021-05-11 LAB — HEMOGLOBIN A1C: Hgb A1c MFr Bld: 6 % (ref 4.6–6.5)

## 2021-05-11 NOTE — Progress Notes (Signed)
Patient ID: Roberto Rivera, male   DOB: 01-Feb-1951, 70 y.o.   MRN: [id number]        Chief Complaint: follow up HTN, HLD and hyperglycemia , ckd       HPI:  Roberto Rivera is a 70 y.o. male here overall doing well, Pt denies chest pain, increased sob or doe, wheezing, orthopnea, PND, increased LE swelling, palpitations, dizziness or syncope.   Pt denies polydipsia, polyuria, or new focal neuro s/s,   Pt denies fever, wt loss, night sweats, loss of appetite, or other constitutional symptoms  No new complaints  wt overall stable  not taking Vit d       Wt Readings from Last 3 Encounters:  05/11/21 246 lb (111.6 kg)  04/13/21 245 lb (111.1 kg)  02/09/21 242 lb 9.6 oz (110 kg)   BP Readings from Last 3 Encounters:  05/11/21 130/68  04/13/21 (!) 152/81  02/22/21 136/84         Past Medical History:  Diagnosis Date   ALLERGIC RHINITIS 04/01/2007   Diabetes mellitus    DIABETES MELLITUS, TYPE II 04/01/2007   ERECTILE DYSFUNCTION, ORGANIC 02/07/2008   Hypercholesteremia    HYPERCHOLESTEROLEMIA 02/07/2008   Hypertension    HYPERTENSION 04/01/2007   MICROCYTOSIS 02/07/2008   OSTEOARTHRITIS, SPINE 02/07/2008   PAF (paroxysmal atrial fibrillation) (HCC) 07/11/2020   Stroke (HCC)    THYROID CYST 02/07/2008   Past Surgical History:  Procedure Laterality Date   cyst removal  01/2021   back   LEFT HEART CATH AND CORONARY ANGIOGRAPHY N/A 09/28/2020   Procedure: LEFT HEART CATH AND CORONARY ANGIOGRAPHY;  Surgeon: Elder Negus, MD;  Location: [facility];  Service: Cardiovascular;  Laterality: N/A;    reports that he quit smoking about 10 months ago. His smoking use included cigarettes. He smoked an average of 1 pack per day. He has never used smokeless tobacco. He reports that he does not drink alcohol and does not use drugs. family history includes Cancer in his mother; Stroke in his sister. No Known Allergies Current Outpatient Medications on File Prior to Visit  Medication Sig Dispense  Refill   Accu-Chek FastClix Lancets MISC USE TO CHECK BLOOD SUGAR AS DIRECTED ONCE DAILY 102 each 2   Accu-Chek FastClix Lancets MISC USE TO CHECK BLOOD SUGAR AS DIRECTED ONCE DAILY 102 each 2   apixaban (ELIQUIS) 5 MG TABS tablet Take 1 tablet (5 mg total) by mouth 2 (two) times daily. 180 tablet 3   Candesartan Cilexetil-HCTZ 32-25 MG TABS TAKE 1 TABLET BY MOUTH EVERY DAY 90 tablet 1   diltiazem (CARDIZEM CD) 180 MG 24 hr capsule Take 1 capsule (180 mg total) by mouth daily. 90 capsule 3   glucose blood (ACCU-CHEK GUIDE) test strip 1 each by Other route daily. And lancets 1/day 100 strip 0   hydrALAZINE (APRESOLINE) 50 MG tablet Take 1 tablet (50 mg total) by mouth 3 (three) times daily. 270 tablet 0   isosorbide dinitrate (ISORDIL) 20 MG tablet Take 1 tablet (20 mg total) by mouth 3 (three) times daily. 270 tablet 0   metoprolol tartrate (LOPRESSOR) 50 MG tablet TAKE 1 TABLET(50 MG) BY MOUTH TWICE DAILY 180 tablet 3   repaglinide (PRANDIN) 1 MG tablet Take 1 tablet (1 mg total) by mouth daily with supper. 90 tablet 3   rosuvastatin (CRESTOR) 5 MG tablet Take 1 tablet (5 mg total) by mouth at bedtime. 1 tab by mouth once daily 90 tablet 3   ammonium lactate (  AMLACTIN) 12 % lotion Apply 1 application topically as needed for dry skin. (Patient not taking: Reported on 05/11/2021) 400 g 0   nitroGLYCERIN (NITROSTAT) 0.4 MG SL tablet Place 1 tablet (0.4 mg total) under the tongue every 5 (five) minutes as needed for chest pain. 90 tablet 3   No current facility-administered medications on file prior to visit.        ROS:  All others reviewed and negative.  Objective        PE:  BP 130/68 (BP Location: Right Arm, Patient Position: Sitting, Cuff Size: Large)   Pulse (!) 59   Temp 98.9 F (37.2 C) (Oral)   Ht 5\' 11"  (1.803 m)   Wt 246 lb (111.6 kg)   SpO2 96%   BMI 34.31 kg/m                 Constitutional: Pt appears in NAD               HENT: Head: NCAT.                Right Ear: External  ear normal.                 Left Ear: External ear normal.                Eyes: . Pupils are equal, round, and reactive to light. Conjunctivae and EOM are normal               Nose: without d/c or deformity               Neck: Neck supple. Gross normal ROM               Cardiovascular: Normal rate and regular rhythm.                 Pulmonary/Chest: Effort normal and breath sounds without rales or wheezing.                Abd:  Soft, NT, ND, + BS, no organomegaly               Neurological: Pt is alert. At baseline orientation, motor grossly intact               Skin: Skin is warm. No rashes, no other new lesions, LE edema - none               Psychiatric: Pt behavior is normal without agitation   Micro: none  Cardiac tracings I have personally interpreted today:  none  Pertinent Radiological findings (summarize): none   Lab Results  Component Value Date   WBC 9.4 09/01/2020   HGB 15.0 09/01/2020   HCT 45.8 09/01/2020   PLT 216.0 09/01/2020   GLUCOSE 129 (H) 05/11/2021   CHOL 113 05/11/2021   TRIG 58.0 05/11/2021   HDL 45.60 05/11/2021   LDLCALC 56 05/11/2021   ALT 12 05/11/2021   AST 14 05/11/2021   NA 139 05/11/2021   K 3.2 (L) 05/11/2021   CL 103 05/11/2021   CREATININE 1.57 (H) 05/11/2021   BUN 32 (H) 05/11/2021   CO2 29 05/11/2021   TSH 1.66 09/01/2020   PSA 2.07 09/01/2020   INR 1.0 06/30/2020   HGBA1C 6.0 05/11/2021   MICROALBUR 7.4 (H) 09/01/2020   Assessment/Plan:  Roberto Rivera is a 70 y.o. Black or African American [2] male with  has a past medical history of ALLERGIC RHINITIS (04/01/2007), Diabetes mellitus, DIABETES  MELLITUS, TYPE II (04/01/2007), ERECTILE DYSFUNCTION, ORGANIC (02/07/2008), Hypercholesteremia, HYPERCHOLESTEROLEMIA (02/07/2008), Hypertension, HYPERTENSION (04/01/2007), MICROCYTOSIS (02/07/2008), OSTEOARTHRITIS, SPINE (02/07/2008), PAF (paroxysmal atrial fibrillation) (HCC) (07/11/2020), Stroke [facility]), and THYROID CYST (02/07/2008).  Vitamin D  deficiency Last vitamin D Lab Results  Component Value Date   VD25OH 24.60 (L) 05/11/2021   Ow, to start oral replacement  Hypertension associated with diabetes (HCC) BP Readings from Last 3 Encounters:  05/11/21 130/68  04/13/21 (!) 152/81  02/22/21 136/84   Stable, pt to continue medical treatment candesartan, hct, hydralazine, diltiatzem   Hyperlipidemia associated with type 2 diabetes mellitus (HCC) Lab Results  Component Value Date   LDLCALC 56 05/11/2021   Stable, pt to continue current statin crestor 5 mg   Diabetes (HCC) Lab Results  Component Value Date   HGBA1C 6.0 05/11/2021   Stable, pt to continue current medical treatment prandin 1 mg qhs   CKD (chronic kidney disease) stage 3, GFR 30-59 ml/min (HCC) Lab Results  Component Value Date   CREATININE 1.57 (H) 05/11/2021   Stable overall, cont to avoid nephrotoxins  Followup: Return in about 6 months (around 11/08/2021).  Oliver Barre, MD 05/15/2021 12:21 PM [facility]

## 2021-05-11 NOTE — Patient Instructions (Addendum)
.  Please take OTC Vitamin D3 at 2000 units per day, indefinitely,  Please consider the new Novavax covid vaccine out next month in the pharmacies  Please continue all other medications as before, and refills have been done if requested.  Please have the pharmacy call with any other refills you may need.  Please continue your efforts at being more active, low cholesterol diet, and weight control.  Please keep your appointments with your specialists as you may have planned  Please make an Appointment to return in 6 months, or sooner if needed

## 2021-05-15 ENCOUNTER — Encounter: Payer: Self-pay | Admitting: Internal Medicine

## 2021-05-15 NOTE — Assessment & Plan Note (Signed)
BP Readings from Last 3 Encounters:  05/11/21 130/68  04/13/21 (!) 152/81  02/22/21 136/84   Stable, pt to continue medical treatment candesartan, hct, hydralazine, diltiatzem

## 2021-05-15 NOTE — Assessment & Plan Note (Signed)
Last vitamin D Lab Results  Component Value Date   VD25OH 24.60 (L) 05/11/2021   Ow, to start oral replacement

## 2021-05-15 NOTE — Assessment & Plan Note (Signed)
Lab Results  Component Value Date   LDLCALC 56 05/11/2021   Stable, pt to continue current statin crestor 5 mg

## 2021-05-15 NOTE — Assessment & Plan Note (Signed)
Lab Results  Component Value Date   HGBA1C 6.0 05/11/2021   Stable, pt to continue current medical treatment prandin 1 mg qhs

## 2021-05-15 NOTE — Assessment & Plan Note (Signed)
Lab Results  Component Value Date   CREATININE 1.57 (H) 05/11/2021   Stable overall, cont to avoid nephrotoxins

## 2021-05-19 ENCOUNTER — Ambulatory Visit (INDEPENDENT_AMBULATORY_CARE_PROVIDER_SITE_OTHER): Payer: Medicare Other

## 2021-05-19 ENCOUNTER — Other Ambulatory Visit: Payer: Self-pay

## 2021-05-19 DIAGNOSIS — E1121 Type 2 diabetes mellitus with diabetic nephropathy: Secondary | ICD-10-CM

## 2021-05-19 DIAGNOSIS — N1831 Chronic kidney disease, stage 3a: Secondary | ICD-10-CM

## 2021-05-19 DIAGNOSIS — I480 Paroxysmal atrial fibrillation: Secondary | ICD-10-CM

## 2021-05-19 DIAGNOSIS — E1169 Type 2 diabetes mellitus with other specified complication: Secondary | ICD-10-CM

## 2021-05-19 NOTE — Patient Instructions (Signed)
Roberto Rivera,  It was great to talk to you today!  Please call me with any questions or concerns.  Visit Information   PATIENT GOALS:   Goals Addressed             This Visit's Progress    Monitor and Manage My Blood Sugar-Diabetes Type 2       Timeframe:  Long-Range Goal Priority:  High Start Date:  05/19/2021                          Expected End Date: 11/16/2021                      Follow Up Date 09/18/2021   - check blood sugar at prescribed times - check blood sugar before and after exercise - check blood sugar if I feel it is too high or too low - enter blood sugar readings and medication or insulin into daily log - take the blood sugar log to all doctor visits    Why is this important?   Checking your blood sugar at home helps to keep it from getting very high or very low.  Writing the results in a diary or log helps the doctor know how to care for you.  Your blood sugar log should have the time, date and the results.  Also, write down the amount of insulin or other medicine that you take.  Other information, like what you ate, exercise done and how you were feeling, will also be helpful.       Track and Manage Blood pressure, Heart Rate, and Rhythm-Atrial Fibrillation/ HTN       Timeframe:  Long-Range Goal Priority:  High Start Date:  05/19/2021                          Expected End Date:  11/16/2021                     Follow Up Date 09/18/2021  -check blood pressure once a day  - check pulse (heart) rate before taking medicine - check pulse (heart) rate once a day - make a plan to exercise regularly - make a plan to eat healthy - keep all lab appointments - take medicine as prescribed    Why is this important?   Atrial fibrillation may have no symptoms. Sometimes the symptoms get worse or happen more often.  It is important to keep track of what your symptoms are and when they happen.  A change in symptoms is important to discuss with your doctor or nurse.   Being active and healthy eating will also help you manage your heart condition.          Consent to CCM Services: [name] was given information about Chronic Care Management services including:  CCM service includes personalized support from designated clinical staff supervised by his physician, including individualized plan of care and coordination with other care providers 24/7 contact phone numbers for assistance for urgent and routine care needs. Service will only be billed when office clinical staff spend 20 minutes or more in a month to coordinate care. Only one practitioner may furnish and bill the service in a calendar month. The patient may stop CCM services at any time (effective at the end of the month) by phone call to the office staff. The patient will be responsible for cost sharing (  co-pay) of up to 20% of the service fee (after annual deductible is met).  Patient agreed to services and verbal consent obtained.   The patient verbalized understanding of instructions, educational materials, and care plan provided today and declined offer to receive copy of patient instructions, educational materials, and care plan.   Telephone follow up appointment with care management team member scheduled for: 4 months The patient has been provided with contact information for the care management team and has been advised to call with any health related questions or concerns.   [name], PharmD Clinical Pharmacist, [place]    CLINICAL CARE PLAN: Patient Care Plan: CCM Care Plan     Problem Identified: Hypertension, Hyperlipidemia, Diabetes, Atrial Fibrillation, Coronary Artery Disease, and Chronic Kidney Disease   Priority: High  Onset Date: 05/19/2021     Long-Range Goal: Disease Management   Start Date: 05/19/2021  Expected End Date: 11/16/2021  This Visit's Progress: On track  Priority: High  Note:    Current Barriers:  Unable to independently monitor  therapeutic efficacy  Pharmacist Clinical Goal(s):  Patient will achieve adherence to monitoring guidelines and medication adherence to achieve therapeutic efficacy maintain control of blood pressure and blood sugars as evidenced by BP and BG log  through collaboration with PharmD and provider.   Interventions: 1:1 collaboration with [name], MD regarding development and update of comprehensive plan of care as evidenced by provider attestation and co-signature Inter-disciplinary care team collaboration (see longitudinal plan of care) Comprehensive medication review performed; medication list updated in electronic medical record  Hypertension (BP goal <130/80) -Controlled -Current treatment: Isosorbide dinitrate 73m - 1 tablet 3 times daily  Hydralazine 555m- 1 tablet 3 times daily  Metoprolol tartrate 5030m 1 tablet twice daily  Candesartan-HCTZ 32-14m28m1 tablet daily  Diltiazem CD 180mg8m capsule daily  -Medications previously tried: amlodipine, losartan, metoprolol succinate,   -Current home readings: 154/80-83  HR ~58 - checks before he takes his medications in the AM BP Readings from Last 3 Encounters:  05/11/21 130/68  04/13/21 (!) 152/81  02/22/21 136/84  -Current dietary habits: reports eating reduced sodium diet -Current exercise habits: nothing at this time  -Denies hypotensive/hypertensive symptoms -Educated on BP goals and benefits of medications for prevention of heart attack, stroke and kidney damage; Daily salt intake goal < 2300 mg; Exercise goal of 150 minutes per week; Importance of home blood pressure monitoring; Proper BP monitoring technique; Symptoms of hypotension and importance of maintaining adequate hydration; -Counseled to monitor BP at home daily, document, and provide log at future appointments -Counseled on diet and exercise extensively Recommended to continue current medication  Hyperlipidemia: (LDL goal < 70) -Controlled Lab Results   Component Value Date   LDLCALC 56 05/11/2021  -Current treatment: Rosuvastatin 5mg -54mtablet daily  Nitroglycerin 0.4mg - 59mablet every 5 minutes x 3 doses as needed for chest pains -Medications previously tried: simvastatin  -Current dietary patterns: reports to eating diet low in high cholesterol foods -Current exercise habits: none at this time -Educated on Cholesterol goals;  Benefits of statin for ASCVD risk reduction; Importance of limiting foods high in cholesterol; Exercise goal of 150 minutes per week; -Counseled on diet and exercise extensively Recommended to continue current medication  Diabetes (A1c goal <7%) -Controlled Lab Results  Component Value Date   HGBA1C 6.0 05/11/2021  -Current medications: Repaglinide 1mg - 148mblet daily before supper  -Medications previously tried: metformin, actos  -  Current home glucose readings fasting glucose: averaging in the 90's, highest that blood sugar has been was 114 -Denies hypoglycemic/hyperglycemic symptoms -Current meal patterns:  Diet - patient did not wish to discuss with appointment today -Current exercise: none at this time  -Educated on A1c and blood sugar goals; Complications of diabetes including kidney damage, retinal damage, and cardiovascular disease; Exercise goal of 150 minutes per week; Benefits of weight loss; Prevention and management of hypoglycemic episodes; Benefits of routine self-monitoring of blood sugar; -Counseled to check feet daily and get yearly eye exams -Counseled on diet and exercise extensively Recommended to continue current medication  Atrial Fibrillation (Goal: prevent stroke and major bleeding) -Controlled -CHADSVASC: 5 -Current treatment: Rate control: Diltiazem CD 161m daily, metoprolol tartrate 512mtwice daily  Anticoagulation: Eliquis 41m59m 1 tablet twice daily  -Medications previously tried: n/a -Home BP and HR readings: reports that BP averaging 150/80's in AM before  medications - HR 58 - no symptoms of bradycardia   -Counseled on increased risk of stroke due to Afib and benefits of anticoagulation for stroke prevention; importance of adherence to anticoagulant exactly as prescribed; bleeding risk associated with Eliquis and importance of self-monitoring for signs/symptoms of bleeding; avoidance of NSAIDs due to increased bleeding risk with anticoagulants; seeking medical attention after a head injury or if there is blood in the urine/stool; -Recommended to continue current medication  Chronic Kidney Disease (Goal: Prevention of disease progression) -Controlled - stable  -Last eGFR 44.67 mL/min -Last Scr - 1.57 mg/dL  -Last CrCl - 56.4 mL/min -Current treatment  Avoidance of nephrotoxic agents / adequate BP and BG control to prevent damage -Recommended to continue current medication   Patient Goals/Self-Care Activities Patient will:  - take medications as prescribed check glucose daily, document, and provide at future appointments check blood pressure daily, document, and provide at future appointments target a minimum of 150 minutes of moderate intensity exercise weekly  Follow Up Plan: Telephone follow up appointment with care management team member scheduled for: 4 months The patient has been provided with contact information for the care management team and has been advised to call with any health related questions or concerns.

## 2021-05-19 NOTE — Progress Notes (Addendum)
Chronic Care Management Pharmacy Note  05/19/2021 Name:  Roberto Rivera MRN:  [id number] DOB:  [birth year]  Summary: - Patient reports that he has been doing well as of late, since most recent blood pressure medications by cardiology, denies and issues - reports that he has been checking blood pressure in the morning before he takes his medication and has been averaging 150/80's -Denies any issues with bruising or bleeding with eliquis -Blood sugars well controlled in the 90's - no issues with hypoglycemia   Recommendations/Changes made from today's visit: -Recommending for patient to check blood pressure after taking AM medications -Continue to monitor blood sugars and reach out should BG become uncontrolled or should he have issues with hypoglycemia  Subjective: Roberto Rivera is an 70 y.o. year old male who is a primary patient of Jenny Reichmann, Hunt Oris, MD.  The CCM team was consulted for assistance with disease management and care coordination needs.    Engaged with patient by telephone for initial visit in response to provider referral for pharmacy case management and/or care coordination services.   Consent to Services:  The patient was given the following information about Chronic Care Management services today, agreed to services, and gave verbal consent: 1. CCM service includes personalized support from designated clinical staff supervised by the primary care provider, including individualized plan of care and coordination with other care providers 2. 24/7 contact phone numbers for assistance for urgent and routine care needs. 3. Service will only be billed when office clinical staff spend 20 minutes or more in a month to coordinate care. 4. Only one practitioner may furnish and bill the service in a calendar month. 5.The patient may stop CCM services at any time (effective at the end of the month) by phone call to the office staff. 6. The patient will be responsible for cost sharing (co-pay) of up to 20%  of the service fee (after annual deductible is met). Patient agreed to services and consent obtained.  Patient Care Team: [name], MD as PCP - General (Internal Medicine) Rosemary Holms, [place] (Podiatry) Delice Bison Darnelle Maffucci, [facility] as Pharmacist (Pharmacist)  Recent office visits:  05/11/21 Cathlean Cower MD - PCP - pt seen for follow up - stable - no medication changes at this time follow up in 6 months    Recent consult visits:  04/13/21 Boneta Lucks DPM Podiatry- pt was seen for Xerosis of skin. Pt had debridement and started on Ammonium Lactate 12 %. Follow up in 3 months   04/13/21 [facility] PA-C Cardiology- pt seen for Coronary Artery Disease and Afib follow up. pt started Isosorb Dinitrate-hydralazine 20-37.5mg  TID - stopped metoprolol succinate and hydralazine- Follow up in 6 weeks.   02/09/21 Renato Shin MD Endocrinology- pt was seen for Type 2 DM. Labs were ordered and Repaglinide reduced to 1 mg daily with supper. Follow up 4 months.   Hospital visits:  02/22/21 - Seen at Urgent Care for infected cyst of right upper back - drained cyst - started doxycycline and APAP - to follow up with dermatology for further evaluation   Objective:  Lab Results  Component Value Date   CREATININE 1.57 (H) 05/11/2021   BUN 32 (H) 05/11/2021   GFR 44.67 (L) 05/11/2021   GFRNONAA 49 (L) 09/28/2020   GFRAA 74 05/31/2007   NA 139 05/11/2021   K 3.2 (L) 05/11/2021   CALCIUM 9.4 05/11/2021   CO2 29 05/11/2021   GLUCOSE 129 (H) 05/11/2021    Lab Results  Component Value  Date/Time   HGBA1C 6.0 05/11/2021 09:23 AM   HGBA1C 5.7 (A) 02/09/2021 08:02 AM   HGBA1C 5.7 (A) 09/29/2020 08:04 AM   HGBA1C 6.1 09/01/2020 10:06 AM   GFR 44.67 (L) 05/11/2021 09:23 AM   GFR 40.52 (L) 09/01/2020 10:06 AM   MICROALBUR 7.4 (H) 09/01/2020 10:06 AM   MICROALBUR 14.5 (H) 01/28/2020 09:56 AM    Last diabetic Eye exam:  Lab Results  Component Value Date/Time   HMDIABEYEEXA No Retinopathy 12/17/2019 12:00 AM     Last diabetic Foot exam:  No results found for: HMDIABFOOTEX   Lab Results  Component Value Date   CHOL 113 05/11/2021   HDL 45.60 05/11/2021   LDLCALC 56 05/11/2021   TRIG 58.0 05/11/2021   CHOLHDL 2 05/11/2021    Hepatic Function Latest Ref Rng & Units 05/11/2021 09/01/2020 07/06/2020  Total Protein 6.0 - 8.3 g/dL 7.5 7.2 8.3  Albumin 3.5 - 5.2 g/dL 4.0 4.4 4.5  AST 0 - 37 U/L _0 ALT 0 - 53 U/L _1 Alk Phosphatase 39 - 117 U/L 82 92 100  Total Bilirubin 0.2 - 1.2 mg/dL 0.5 0.6 0.5  Bilirubin, Direct 0.0 - 0.3 mg/dL 0.1 0.2 0.1    Lab Results  Component Value Date/Time   TSH 1.66 09/01/2020 10:06 AM   TSH 1.64 01/28/2020 09:56 AM    CBC Latest Ref Rng & Units 09/01/2020 07/06/2020 07/04/2020  WBC 4.0 - 10.5 K/uL 9.4 7.2 6.9  Hemoglobin 13.0 - 17.0 g/dL 15.0 17.0 15.2  Hematocrit 39.0 - 52.0 % 45.8 52.1(H) 48.5  Platelets 150.0 - 400.0 K/uL 216.0 210.0 210    Lab Results  Component Value Date/Time   VD25OH 24.60 (L) 05/11/2021 09:23 AM   VD25OH 42.20 09/01/2020 10:06 AM    Clinical ASCVD: Yes  The ASCVD Risk score (Arnett DK, et al., 2019) failed to calculate for the following reasons:   The patient has a prior MI or stroke diagnosis    Depression screen [facility] 2/9 05/11/2021 11/03/2020 09/01/2020  Decreased Interest 0 0 0  Down, Depressed, Hopeless 0 0 0  PHQ - 2 Score 0 0 0     Social History   Tobacco Use  Smoking Status Former   Packs/day: 1.00   Types: Cigarettes   Quit date: 06/23/2020   Years since quitting: 0.9  Smokeless Tobacco Never   BP Readings from Last 3 Encounters:  05/11/21 130/68  04/13/21 (!) 152/81  02/22/21 136/84   Pulse Readings from Last 3 Encounters:  05/11/21 (!) 59  04/13/21 (!) 52  02/22/21 66   Wt Readings from Last 3 Encounters:  05/11/21 246 lb (111.6 kg)  04/13/21 245 lb (111.1 kg)  02/09/21 242 lb 9.6 oz (110 kg)   BMI Readings from Last 3 Encounters:  05/11/21 34.31 kg/m  04/13/21 34.17 kg/m   02/09/21 33.84 kg/m    Assessment/Interventions: Review of patient past medical history, allergies, medications, health status, including review of consultants reports, laboratory and other test data, was performed as part of comprehensive evaluation and provision of chronic care management services.   SDOH:  (Social Determinants of Health) assessments and interventions performed: Yes  SDOH Screenings   Alcohol Screen: Low Risk    Last Alcohol Screening Score (AUDIT): 0  Depression (PHQ2-9): Low Risk    PHQ-2 Score: 0  Financial Resource Strain: Low Risk    Difficulty of Paying Living Expenses: Not hard at all  Food Insecurity: No Food Insecurity  Worried About Charity fundraiser in the Last Year: Never true   Ran Out of Food in the Last Year: Never true  Housing: Low Risk    Last Housing Risk Score: 0  Physical Activity: Inactive   Days of Exercise per Week: 0 days   Minutes of Exercise per Session: 0 min  Social Connections: Moderately Isolated   Frequency of Communication with Friends and Family: More than three times a week   Frequency of Social Gatherings with Friends and Family: Once a week   Attends Religious Services: Never   Marine scientist or Organizations: No   Attends Music therapist: Never   Marital Status: Living with partner  Stress: No Stress Concern Present   Feeling of Stress : Not at all  Tobacco Use: Medium Risk   Smoking Tobacco Use: Former   Smokeless Tobacco Use: Never  Transportation Needs: No Data processing manager (Medical): No   Lack of Transportation (Non-Medical): No    CCM Care Plan  No Known Allergies  Medications Reviewed Today     Reviewed by [name], [facility] (Pharmacist) on 05/19/21 at 1113  Med List Status: <None>   Medication Order Taking? Sig Documenting Provider Last Dose Status Informant  Accu-Chek FastClix Lancets MISC [id number] Yes USE TO CHECK BLOOD SUGAR AS DIRECTED ONCE  DAILY Renato Shin, MD Taking Active   Accu-Chek FastClix Lancets MISC [id number] Yes USE TO CHECK BLOOD SUGAR AS DIRECTED ONCE DAILY Renato Shin, MD Taking Active   ammonium lactate (AMLACTIN) 12 % lotion [id number] No Apply 1 application topically as needed for dry skin.  Patient not taking: No sig reported   Felipa Furnace, DPM Not Taking Active   apixaban (ELIQUIS) 5 MG TABS tablet [id number] Yes Take 1 tablet (5 mg total) by mouth 2 (two) times daily. Verneda Skill Taking Active   Candesartan Cilexetil-HCTZ 32-25 MG TABS [id number] Yes TAKE 1 TABLET BY MOUTH EVERY DAY [name], MD Taking Active   diltiazem (CARDIZEM CD) 180 MG 24 hr capsule [id number] Yes Take 1 capsule (180 mg total) by mouth daily. [name], MD Taking Active   glucose blood (ACCU-CHEK GUIDE) test strip [id number] Yes 1 each by Other route daily. And lancets 1/day Renato Shin, MD Taking Active   hydrALAZINE (APRESOLINE) 50 MG tablet [id number] Yes Take 1 tablet (50 mg total) by mouth 3 (three) times daily. Cantwell, Celeste C, PA-C Taking Active   isosorbide dinitrate (ISORDIL) 20 MG tablet [id number] Yes Take 1 tablet (20 mg total) by mouth 3 (three) times daily. Cantwell, Celeste C, PA-C Taking Active   metoprolol tartrate (LOPRESSOR) 50 MG tablet [id number] Yes TAKE 1 TABLET(50 MG) BY MOUTH TWICE DAILY [name], MD Taking Active   nitroGLYCERIN (NITROSTAT) 0.4 MG SL tablet [id number]  Place 1 tablet (0.4 mg total) under the tongue every 5 (five) minutes as needed for chest pain. Alethia Berthold, PA-C  Expired 04/13/21 2359            Med Note Dorthey Sawyer   Tue Sep 28, 2020 10:23 AM) Never taken   repaglinide (PRANDIN) 1 MG tablet [id number] Yes Take 1 tablet (1 mg total) by mouth daily with supper. Renato Shin, MD Taking Active   rosuvastatin (CRESTOR) 5 MG tablet [id number] Yes Take 1 tablet (5 mg total) by mouth at bedtime. 1 tab by mouth once daily [name], MD Taking Active  Patient Active Problem List   Diagnosis Date Noted   Vitamin D deficiency 11/08/2020   Chronic anticoagulation 11/08/2020   Abnormal stress test 09/05/2020   Nail disorder 09/01/2020   Paroxysmal A-fib ([place]) 07/11/2020   History of stroke 07/06/2020   Acute CVA (cerebrovascular accident) ([place]) 06/30/2020   Hyperlipidemia associated with type 2 diabetes mellitus ([place]) 06/30/2020   Acute kidney injury superimposed on chronic kidney disease ([place]) 06/30/2020   Low back pain 08/26/2019   CKD (chronic kidney disease) stage 3, GFR 30-59 ml/min ([place]) 02/05/2019   Left groin pain 02/05/2019   Hypokalemia 06/26/2017   Back pain 05/24/2017   Abnormal ECG 04/25/2017   Screening examination for infectious disease 03/10/2016   Skin nodule 02/26/2015   Encounter for well adult exam with abnormal findings 04/26/2013   Rash and nonspecific skin eruption 04/26/2013   Left knee pain 04/11/2013   Diabetes ([place]) 10/13/2011   Screening for prostate cancer 10/13/2011   Encounter for long-term (current) use of other medications 10/13/2011   Tobacco use 02/04/2010   Edema 02/04/2010   PROTEINURIA, MILD 01/29/2009   THYROID CYST 02/07/2008   ERECTILE DYSFUNCTION, ORGANIC 02/07/2008   OSTEOARTHRITIS, SPINE 02/07/2008   MICROCYTOSIS 02/07/2008   Hypertension associated with diabetes ([place]) 04/01/2007   Allergic rhinitis 04/01/2007    Immunization History  Administered Date(s) Administered   PFIZER(Purple Top)SARS-COV-2 Vaccination 04/23/2020, 05/14/2020   Pneumococcal Conjugate-13 12/25/2017   Pneumococcal Polysaccharide-23 02/04/2010, 01/28/2020   Td 01/29/2009   Zoster, Live 12/14/2011    Conditions to be addressed/monitored:  Hypertension, Hyperlipidemia, Diabetes, Atrial Fibrillation, Coronary Artery Disease, and Chronic Kidney Disease  Care Plan : CCM Care Plan  Updates made by [name], RPH since 05/19/2021 12:00 AM     Problem: Hypertension, Hyperlipidemia, Diabetes,  Atrial Fibrillation, Coronary Artery Disease, and Chronic Kidney Disease   Priority: High  Onset Date: 05/19/2021     Long-Range Goal: Disease Management   Start Date: 05/19/2021  Expected End Date: 11/16/2021  This Visit's Progress: On track  Priority: High  Note:    Current Barriers:  Unable to independently monitor therapeutic efficacy  Pharmacist Clinical Goal(s):  Patient will achieve adherence to monitoring guidelines and medication adherence to achieve therapeutic efficacy maintain control of blood pressure and blood sugars as evidenced by BP and BG log  through collaboration with PharmD and provider.   Interventions: 1:1 collaboration with [name], MD regarding development and update of comprehensive plan of care as evidenced by provider attestation and co-signature Inter-disciplinary care team collaboration (see longitudinal plan of care) Comprehensive medication review performed; medication list updated in electronic medical record  Hypertension (BP goal <130/80) -Controlled -Current treatment: Isosorbide dinitrate 20mg  - 1 tablet 3 times daily  Hydralazine 50mg  - 1 tablet 3 times daily  Metoprolol tartrate 50mg  - 1 tablet twice daily  Candesartan-HCTZ 32-25mg  - 1 tablet daily  Diltiazem CD 180mg  - 1 capsule daily  -Medications previously tried: amlodipine, losartan, metoprolol succinate,   -Current home readings: 154/80-83  HR ~58 - checks before he takes his medications in the AM BP Readings from Last 3 Encounters:  05/11/21 130/68  04/13/21 (!) 152/81  02/22/21 136/84  -Current dietary habits: reports eating reduced sodium diet -Current exercise habits: nothing at this time  -Denies hypotensive/hypertensive symptoms -Educated on BP goals and benefits of medications for prevention of heart attack, stroke and kidney damage; Daily salt intake goal < 2300 mg; Exercise goal of 150 minutes per week; Importance of home blood pressure monitoring;  Proper BP  monitoring technique; Symptoms of hypotension and importance of maintaining adequate hydration; -Counseled to monitor BP at home daily, document, and provide log at future appointments -Counseled on diet and exercise extensively Recommended to continue current medication  Hyperlipidemia: (LDL goal < 70) -Controlled Lab Results  Component Value Date   LDLCALC 56 05/11/2021  -Current treatment: Rosuvastatin 49m - 1 tablet daily  Nitroglycerin 0.49m- 1 tablet every 5 minutes x 3 doses as needed for chest pains -Medications previously tried: simvastatin  -Current dietary patterns: reports to eating diet low in high cholesterol foods -Current exercise habits: none at this time -Educated on Cholesterol goals;  Benefits of statin for ASCVD risk reduction; Importance of limiting foods high in cholesterol; Exercise goal of 150 minutes per week; -Counseled on diet and exercise extensively Recommended to continue current medication  Diabetes (A1c goal <7%) -Controlled Lab Results  Component Value Date   HGBA1C 6.0 05/11/2021  -Current medications: Repaglinide 22m65m 1 tablet daily before supper  -Medications previously tried: metformin, actos  -Current home glucose readings fasting glucose: averaging in the 90's, highest that blood sugar has been was 114 -Denies hypoglycemic/hyperglycemic symptoms -Current meal patterns:  Diet - patient did not wish to discuss with appointment today -Current exercise: none at this time  -Educated on A1c and blood sugar goals; Complications of diabetes including kidney damage, retinal damage, and cardiovascular disease; Exercise goal of 150 minutes per week; Benefits of weight loss; Prevention and management of hypoglycemic episodes; Benefits of routine self-monitoring of blood sugar; -Counseled to check feet daily and get yearly eye exams -Counseled on diet and exercise extensively Recommended to continue current medication  Atrial Fibrillation  (Goal: prevent stroke and major bleeding) -Controlled -CHADSVASC: 5 -Current treatment: Rate control: Diltiazem CD 180m62mily, metoprolol tartrate 50mg29mce daily  Anticoagulation: Eliquis 5mg -52mtablet twice daily  -Medications previously tried: n/a -Home BP and HR readings: reports that BP averaging 150/80's in AM before medications - HR 58 - no symptoms of bradycardia   -Counseled on increased risk of stroke due to Afib and benefits of anticoagulation for stroke prevention; importance of adherence to anticoagulant exactly as prescribed; bleeding risk associated with Eliquis and importance of self-monitoring for signs/symptoms of bleeding; avoidance of NSAIDs due to increased bleeding risk with anticoagulants; seeking medical attention after a head injury or if there is blood in the urine/stool; -Recommended to continue current medication  Chronic Kidney Disease (Goal: Prevention of disease progression) -Controlled - stable  -Last eGFR 44.67 mL/min -Last Scr - 1.57 mg/dL  -Last CrCl - 56.4 mL/min -Current treatment  Avoidance of nephrotoxic agents / adequate BP and BG control to prevent damage -Recommended to continue current medication   Patient Goals/Self-Care Activities Patient will:  - take medications as prescribed check glucose daily, document, and provide at future appointments check blood pressure daily, document, and provide at future appointments target a minimum of 150 minutes of moderate intensity exercise weekly  Follow Up Plan: Telephone follow up appointment with care management team member scheduled for: 4 months The patient has been provided with contact information for the care management team and has been advised to call with any health related questions or concerns.        Medication Assistance: None required.  Patient affirms current coverage meets needs.  [id number] [id number]: [phone]  [phone]   Uses pill box? Yes Pt endorses 100% compliance  Care Plan and Follow Up Patient Decision:  Patient agrees to Care Plan and Follow-up.  Plan: Telephone follow up appointment with care management team member scheduled for:  4 months and The patient has been provided with contact information for the care management team and has been advised to call with any health related questions or concerns.   [name], PharmD Clinical Pharmacist, [place]  05/19/21 12:15 PM    Medical screening examination/treatment/procedure(s) were performed by non-physician practitioner and as supervising physician I was immediately available for consultation/collaboration.  I agree with above. Cathlean Cower, MD

## 2021-05-20 DIAGNOSIS — N1831 Chronic kidney disease, stage 3a: Secondary | ICD-10-CM

## 2021-05-20 DIAGNOSIS — E785 Hyperlipidemia, unspecified: Secondary | ICD-10-CM | POA: Diagnosis not present

## 2021-05-20 DIAGNOSIS — E1121 Type 2 diabetes mellitus with diabetic nephropathy: Secondary | ICD-10-CM

## 2021-05-20 DIAGNOSIS — I480 Paroxysmal atrial fibrillation: Secondary | ICD-10-CM

## 2021-05-20 DIAGNOSIS — E1169 Type 2 diabetes mellitus with other specified complication: Secondary | ICD-10-CM

## 2021-05-24 NOTE — Progress Notes (Signed)
Primary Physician/Referring:  Corwin Levins, MD  Patient ID: Roberto Rivera, male    DOB: [birth year], 70 y.o.   MRN: [id number]  Chief Complaint  Patient presents with   Coronary artery disease involving native coronary artery of   Fatigue   Hypertension   Follow-up    6 weeks   HPI:    Roberto Rivera  is a 70 y.o. AA male with history of type 2 diabetes, stage III chronic kidney disease, hypertension, hyperlipidemia, and tobacco use (smokes pack/day), and paroxysmal atrial fibrillation. He presented to [facility] ED 06/30/2020 with complaints of left hand numbness and weakness. He was diagnosed with small acute cerebral infarct. Cardiac monitoring revealed atrial fibrillation as the likely etiology, he is not tolerating long-term anticoagulation.  Patient underwent high risk nuclear stress testing 07/2020, and on 09/28/2020 repeat cardiac catheterization and angiography revealed normal coronary arteries with the exception of RCA disease, recommended medical management.  Patient presents for 6-week follow-up of hypertension and fatigue.  At last office visit added isosorbide dinitrate 20 mg 3 times daily with hydralazine.  Since last office visit patient's PCP increased hydralazine from 25 mg to 50 mg 3 times daily.  Patient brings with him a written log of home blood pressure readings, which he takes in the morning prior to taking his antihypertensive medications.  Review of written log shows blood pressure remains uncontrolled.  Blood pressure is also elevated in the office today.  Patient does admit to dietary noncompliance and he has not lost weight since last office visit.  In regard to sleep habits, he is sleeping better and has less daytime fatigue.  However would still likely benefit from sleep apnea evaluation although he prefers to hold off at this time.  Past Medical History:  Diagnosis Date   ALLERGIC RHINITIS 04/01/2007   Diabetes mellitus    DIABETES MELLITUS, TYPE II 04/01/2007   ERECTILE  DYSFUNCTION, ORGANIC 02/07/2008   Hypercholesteremia    HYPERCHOLESTEROLEMIA 02/07/2008   Hypertension    HYPERTENSION 04/01/2007   MICROCYTOSIS 02/07/2008   OSTEOARTHRITIS, SPINE 02/07/2008   PAF (paroxysmal atrial fibrillation) (HCC) 07/11/2020   Stroke (HCC)    THYROID CYST 02/07/2008   Past Surgical History:  Procedure Laterality Date   cyst removal  01/2021   back   LEFT HEART CATH AND CORONARY ANGIOGRAPHY N/A 09/28/2020   Procedure: LEFT HEART CATH AND CORONARY ANGIOGRAPHY;  Surgeon: Elder Negus, MD;  Location: [facility];  Service: Cardiovascular;  Laterality: N/A;   Family History  Problem Relation Age of Onset   Cancer Mother        had uncertain type of cancer   Stroke Sister     Social History   Tobacco Use   Smoking status: Former    Packs/day: 1.00    Types: Cigarettes    Quit date: 06/23/2020    Years since quitting: 0.9   Smokeless tobacco: Never  Substance Use Topics   Alcohol use: No    Alcohol/week: 0.0 standard drinks   Marital Status: Married   ROS  Review of Systems  Constitutional: Positive for weight gain (stable). Negative for malaise/fatigue (improved).  Cardiovascular:  Negative for chest pain, claudication, leg swelling, near-syncope, orthopnea, palpitations, paroxysmal nocturnal dyspnea and syncope.  Respiratory:  Negative for shortness of breath.   Hematologic/Lymphatic: Does not bruise/bleed easily.   Objective  Blood pressure (!) 142/71, pulse (!) 59, temperature 97.7 F (36.5 C), resp. rate 16, height 5\' 11"  (1.803 m), weight 245  lb (111.1 kg), SpO2 94 %.  Vitals with BMI 05/25/2021 05/11/2021 04/13/2021  Height 5\' 11"  5\' 11"  -  Weight 245 lbs 246 lbs -  BMI 34.19 34.33 -  Systolic 142 130  Diastolic 71 68 81  Pulse 59 59 52      Physical Exam Vitals reviewed.  Constitutional:      Appearance: He is obese.  HENT:     Head: Normocephalic and atraumatic.  Cardiovascular:     Rate and Rhythm: Normal rate and  regular rhythm.     Pulses: Intact distal pulses.     Heart sounds: S1 normal and S2 normal. No murmur heard.   No gallop.     Comments: No JVD.  Pulmonary:     Effort: Pulmonary effort is normal. No respiratory distress.     Breath sounds: No wheezing, rhonchi or rales.  Musculoskeletal:     Right lower leg: No edema.     Left lower leg: No edema.  Skin:    General: Skin is warm and dry.  Neurological:     Mental Status: He is alert.  Physical exam unchanged compared to previous. Laboratory examination:   Recent Labs    07/01/20 0314 07/04/20 1037 07/06/20 1614 09/01/20 1006 09/28/20 1040 05/11/21 0923  NA 138 137   < > 138 138 139  K 3.1* 3.5   < > 3.7 3.1* 3.2*  CL 104 104   < > 100 102 103  CO2 25 23   < > 33* 24 29  GLUCOSE 121* 84   < > 89 117* 129*  BUN 16 16   < > 35* 24* 32*  CREATININE 1.52* 1.40*   < > 1.71* 1.53* 1.57*  CALCIUM 8.9 9.0   < > 9.8 9.1 9.4  GFRNONAA 50* 55*  --   --  49*  --    < > = values in this interval not displayed.   estimated creatinine clearance is 56.3 mL/min (A) (by C-G formula based on SCr of 1.57 mg/dL (H)).  CMP Latest Ref Rng & Units 05/11/2021 09/28/2020 09/01/2020  Glucose 70 - 99 mg/dL 11/26/2020) 10/30/2020) 89  BUN 6 - 23 mg/dL 326(Z) 124(P) 80(D)  Creatinine 0.40 - 1.50 mg/dL 98(P) 38(S) 5.05(L)  Sodium 135 - 145 mEq/L 139 138 138  Potassium 3.5 - 5.1 mEq/L 3.2(L) 3.1(L) 3.7  Chloride 96 - 112 mEq/L 103 102 100  CO2 19 - 32 mEq/L 29 24 33(H)  Calcium 8.4 - 10.5 mg/dL 9.4 9.1 9.8  Total Protein 6.0 - 8.3 g/dL 7.5 - 7.2  Total Bilirubin 0.2 - 1.2 mg/dL 0.5 - 0.6  Alkaline Phos 39 - 117 U/L 82 - 92  AST 0 - 37 U/L 14 - 14  ALT 0 - 53 U/L 12 - 12   CBC Latest Ref Rng & Units 09/01/2020 07/06/2020 07/04/2020  WBC 4.0 - 10.5 K/uL 9.4 7.2 6.9  Hemoglobin 13.0 - 17.0 g/dL 07/08/2020 07/06/2020 37.9  Hematocrit 39.0 - 52.0 % 45.8 52.1(H) 48.5  Platelets 150.0 - 400.0 K/uL 216.0 210.0 210    Lipid Panel Recent Labs    07/01/20 0314  09/01/20 1006 05/11/21 0923  CHOL 79 100 113  TRIG 72 66.0 58.0  LDLCALC 39 48 56  VLDL 14 13.2 11.6  HDL 26* 10/30/20 45.60  CHOLHDL 3.0 3 2    HEMOGLOBIN 05/13/21 Lab Results  Component Value Date   HGBA1C 6.0 05/11/2021   MPG 162.81 06/30/2020   TSH  Recent Labs    09/01/20 1006  TSH 1.66    External labs:  None   Allergies  No Known Allergies   Medications Prior to Visit:   Outpatient Medications Prior to Visit  Medication Sig Dispense Refill   Accu-Chek FastClix Lancets MISC USE TO CHECK BLOOD SUGAR AS DIRECTED ONCE DAILY 102 each 2   Accu-Chek FastClix Lancets MISC USE TO CHECK BLOOD SUGAR AS DIRECTED ONCE DAILY 102 each 2   apixaban (ELIQUIS) 5 MG TABS tablet Take 1 tablet (5 mg total) by mouth 2 (two) times daily. 180 tablet 3   Candesartan Cilexetil-HCTZ 32-25 MG TABS TAKE 1 TABLET BY MOUTH EVERY DAY 90 tablet 1   diltiazem (CARDIZEM CD) 180 MG 24 hr capsule Take 1 capsule (180 mg total) by mouth daily. 90 capsule 3   glucose blood (ACCU-CHEK GUIDE) test strip 1 each by Other route daily. And lancets 1/day 100 strip 0   isosorbide dinitrate (ISORDIL) 20 MG tablet Take 1 tablet (20 mg total) by mouth 3 (three) times daily. 270 tablet 0   metoprolol tartrate (LOPRESSOR) 50 MG tablet TAKE 1 TABLET(50 MG) BY MOUTH TWICE DAILY 180 tablet 3   nitroGLYCERIN (NITROSTAT) 0.4 MG SL tablet Place 1 tablet (0.4 mg total) under the tongue every 5 (five) minutes as needed for chest pain. 90 tablet 3   repaglinide (PRANDIN) 1 MG tablet Take 1 tablet (1 mg total) by mouth daily with supper. 90 tablet 3   rosuvastatin (CRESTOR) 5 MG tablet Take 1 tablet (5 mg total) by mouth at bedtime. 1 tab by mouth once daily 90 tablet 3   hydrALAZINE (APRESOLINE) 50 MG tablet Take 1 tablet (50 mg total) by mouth 3 (three) times daily. 270 tablet 0   ammonium lactate (AMLACTIN) 12 % lotion Apply 1 application topically as needed for dry skin. (Patient not taking: No sig reported) 400 g 0   No  facility-administered medications prior to visit.   Final Medications at End of Visit    Current Meds  Medication Sig   Accu-Chek FastClix Lancets MISC USE TO CHECK BLOOD SUGAR AS DIRECTED ONCE DAILY   Accu-Chek FastClix Lancets MISC USE TO CHECK BLOOD SUGAR AS DIRECTED ONCE DAILY   apixaban (ELIQUIS) 5 MG TABS tablet Take 1 tablet (5 mg total) by mouth 2 (two) times daily.   Candesartan Cilexetil-HCTZ 32-25 MG TABS TAKE 1 TABLET BY MOUTH EVERY DAY   diltiazem (CARDIZEM CD) 180 MG 24 hr capsule Take 1 capsule (180 mg total) by mouth daily.   glucose blood (ACCU-CHEK GUIDE) test strip 1 each by Other route daily. And lancets 1/day   isosorbide dinitrate (ISORDIL) 20 MG tablet Take 1 tablet (20 mg total) by mouth 3 (three) times daily.   metoprolol tartrate (LOPRESSOR) 50 MG tablet TAKE 1 TABLET(50 MG) BY MOUTH TWICE DAILY   nitroGLYCERIN (NITROSTAT) 0.4 MG SL tablet Place 1 tablet (0.4 mg total) under the tongue every 5 (five) minutes as needed for chest pain.   repaglinide (PRANDIN) 1 MG tablet Take 1 tablet (1 mg total) by mouth daily with supper.   rosuvastatin (CRESTOR) 5 MG tablet Take 1 tablet (5 mg total) by mouth at bedtime. 1 tab by mouth once daily   [DISCONTINUED] hydrALAZINE (APRESOLINE) 50 MG tablet Take 1 tablet (50 mg total) by mouth 3 (three) times daily.    Radiology:   No results found.  Cardiac Studies:   Left heart cath and coronary angiography 09/28/2020:  LM: Large, normal LAD:  Large, minimal luminal irregularities Ramus: Large, normal LCx: Large, normal RCA:  Prox calcific 50% stenosis, distal RCA subtotal occlusion with TIMI 1 flow   PCV MYOCARDIAL PERFUSION WO LEXISCAN 08/04/2020 Nondiagnostic ECG stress due to pharmacologic stress. Resting EKG demonstrated atrial fibrillation with rapid ventricular response. Non-specific ST-T abnormality. Peak EKG/ demonstrated atrial fibrillation with rapid ventricular response. There is a reversible moderate defect in the  lateral and inferior regions. consistent with severe ischemia. Overall LV systolic function is abnormal with regional wall motion abnormalities in the same area.  Stress LV EF: 34%. No previous exam available for comparison. High risk study.  Venous Bilateral Ultrasound 07/02/2020: RIGHT:  - There is no evidence of deep vein thrombosis in the lower extremity.  - No cystic structure found in the popliteal fossa.  LEFT:  - There is no evidence of deep vein thrombosis in the lower extremity.  - No cystic structure found in the popliteal fossa.  ECHOCARDIOGRAM COMPLETE 07/01/2020 1. Left ventricular ejection fraction, by estimation, is 60 to 65%. The left ventricle has normal function. The left ventricle has no regional wall motion abnormalities. There is mild left ventricular hypertrophy. Left ventricular diastolic parameters are consistent with Grade I diastolic dysfunction (impaired relaxation).   2. Right ventricular systolic function is normal. The right ventricular size is normal. Tricuspid regurgitation signal is inadequate for assessing PA pressure.   3. Left atrial size was severely dilated.   4. The mitral valve is normal in structure. No evidence of mitral valve regurgitation. No evidence of mitral stenosis. There was chordal systolic anterior motion noted (not valvular).  5. The aortic valve is tricuspid. Aortic valve regurgitation is mild. Mild aortic valve stenosis. Aortic valve mean gradient measures 14.0 mmHg.   6. Aortic dilatation noted. There is mild dilatation of the ascending aorta, measuring 40 mm.  7. The inferior vena cava is normal in size with greater than 50% respiratory variability, suggesting right atrial pressure of 3 mmHg.   VAS US CAROTID 07/01/2020 Right Carotid: Velocities in the right ICA are consistent with a 1-39% stenosis.  Left Carotid: Velocities in the left ICA are consistent with a 1-39% stenosis.  Vertebrals:  Bilateral vertebral arteries demonstrate  antegrade flow.  Subclavians: Normal flow hemodynamics were seen in bilateral subclavian arteries.   EKG:  04/13/2021: Sinus bradycardia at a rate of 51 bpm.  Left atrial enlargement.  Left axis, left anterior fascicular block.  Nonspecific T wave abnormality.  Compared to EKG 09/20/2020, no poor R wave progression  07/12/2020: sinus bradycardia at a rate of 57 bpm with borderline 1st degree AV block, left atrial enlargement. Normal axis.  Nonspecific T wave abnormality.  07/02/2020: Sinus rhythm at a rate of 72 bpm, normal axis.  T wave inversions lateral leads, cannot exclude ischemia.   07/01/2020: Atrial fibrillation with rapid ventricular response at a rate of 115 bpm.  Normal axis.  Assessment     ICD-10-CM   1. Essential hypertension  I10     2. PAF (paroxysmal atrial fibrillation) (HCC)  I48.0        Medications Discontinued During This Encounter  Medication Reason   ammonium lactate (AMLACTIN) 12 % lotion Error   hydrALAZINE (APRESOLINE) 50 MG tablet Reorder    Meds ordered this encounter  Medications   hydrALAZINE (APRESOLINE) 100 MG tablet    Sig: Take 1 tablet (100 mg total) by mouth 3 (three) times daily.    Dispense:  90 tablet    Refill:  3  This patients CHA2DS2-VASc Score 5 (HTN, DM, Age, Stroke) and yearly risk of stroke 7.2%.   Recommendations:   Ebb Carelock is a 70 y.o. . AA male with history of type 2 diabetes, stage III chronic kidney disease, hypertension, hyperlipidemia, and tobacco use, and paroxysmal atrial fibrillation. Patient underwent high risk nuclear stress testing 07/2020, and is a current cardiac catheterization and angiography revealed normal coronary arteries with the exception of RCA disease, recommended medical management.  Patient presents for 6-week follow-up of hypertension and fatigue.  At last office visit added isosorbide dinitrate 20 mg 3 times daily with hydralazine.  Since last office visit patient's PCP increased hydralazine from 25  mg to 50 mg 3 times daily.  Patient's blood pressure remains uncontrolled.  We will increase hydralazine from 50 mg to 100 mg 3 times daily.  Encourage patient to continue to monitor his blood pressure on a daily basis at home, however asked that he monitor his blood pressure later in the day so that he has taken his antihypertensive medications prior to checking blood pressure.  She will continue to monitor blood pressure on a daily basis and notify our office if it remains >130/80 mmHg.  Again encourage patient to consider sleep apnea evaluation in the future.  Discussed at length with patient regarding the importance of diet and lifestyle modifications.  Reiterated need to comply with DASH diet as well as lose significant weight in order to improve blood pressure control and reduce overall cardiovascular risk.  In regard to atrial fibrillation, he has had no known recurrence since last visit and continues to tolerate anticoagulation without bleeding diathesis.  Follow-up in 3 months, sooner if needed, for hypertension, hyperlipidemia, paroxysmal atrial fibrillation.   Rayford Halsted, PA-C 05/25/2021, 11:46 AM Office: [phone]

## 2021-05-25 ENCOUNTER — Other Ambulatory Visit: Payer: Self-pay

## 2021-05-25 ENCOUNTER — Encounter: Payer: Self-pay | Admitting: Student

## 2021-05-25 ENCOUNTER — Ambulatory Visit: Payer: Medicare Other | Admitting: Student

## 2021-05-25 VITALS — BP 142/71 | HR 59 | Temp 97.7°F | Resp 16 | Ht 71.0 in | Wt 245.0 lb

## 2021-05-25 DIAGNOSIS — I10 Essential (primary) hypertension: Secondary | ICD-10-CM

## 2021-05-25 DIAGNOSIS — I480 Paroxysmal atrial fibrillation: Secondary | ICD-10-CM

## 2021-05-25 MED ORDER — HYDRALAZINE HCL 100 MG PO TABS: 100.0000 mg | ORAL_TABLET | Freq: Three times a day (TID) | ORAL | 3 refills | Status: DC

## 2021-06-15 ENCOUNTER — Other Ambulatory Visit: Payer: Self-pay

## 2021-06-15 ENCOUNTER — Ambulatory Visit (INDEPENDENT_AMBULATORY_CARE_PROVIDER_SITE_OTHER): Payer: Medicare Other | Admitting: Endocrinology

## 2021-06-15 ENCOUNTER — Encounter: Payer: Self-pay | Admitting: Endocrinology

## 2021-06-15 VITALS — BP 118/50 | HR 68 | Ht 71.0 in | Wt 245.0 lb

## 2021-06-15 DIAGNOSIS — E1121 Type 2 diabetes mellitus with diabetic nephropathy: Secondary | ICD-10-CM | POA: Diagnosis not present

## 2021-06-15 MED ORDER — ACCU-CHEK GUIDE VI STRP: 1.0000 | ORAL_STRIP | Freq: Every day | 3 refills | Status: DC

## 2021-06-15 NOTE — Patient Instructions (Addendum)
Please continue the same repaglinide. check your blood sugar once a day.  vary the time of day when you check, between before the 3 meals, and at bedtime.  also check if you have symptoms of your blood sugar being too high or too low.  please keep a record of the readings and bring it to your next appointment here (or you can bring the meter itself).  You can write it on any piece of paper.  please call us sooner if your blood sugar goes below 70, or if you have a lot of readings over 200.   Please come back for a follow-up appointment in 6 months.

## 2021-06-15 NOTE — Progress Notes (Signed)
Subjective:    Patient ID: Roberto Rivera, male    DOB: 27-Dec-1950, 70 y.o.   MRN: [id number]  HPI Pt returns for f/u of diabetes mellitus: DM type: 2 Dx'ed: 2015. Complications: CRI and mild CAD.   Therapy: repaglinide DKA: never Severe hypoglycemia: never.  Pancreatitis: never Pancreatic imaging: never.  SDOH: he declines name brand meds, and also bromocriptine, due to cost.   Other: he has never taken insulin; edema limits rx options; renal insuff limits metformin dosage.   Interval history:  Meter is downloaded today, and the printout is scanned into the record.  All cbg's are approx 100.  All are checked 7AM-9AM.  He takes meds as rx'ed.   Past Medical History:  Diagnosis Date   ALLERGIC RHINITIS 04/01/2007   Diabetes mellitus    DIABETES MELLITUS, TYPE II 04/01/2007   ERECTILE DYSFUNCTION, ORGANIC 02/07/2008   Hypercholesteremia    HYPERCHOLESTEROLEMIA 02/07/2008   Hypertension    HYPERTENSION 04/01/2007   MICROCYTOSIS 02/07/2008   OSTEOARTHRITIS, SPINE 02/07/2008   PAF (paroxysmal atrial fibrillation) (HCC) 07/11/2020   Stroke (HCC)    THYROID CYST 02/07/2008    Past Surgical History:  Procedure Laterality Date   cyst removal  01/2021   back   LEFT HEART CATH AND CORONARY ANGIOGRAPHY N/A 09/28/2020   Procedure: LEFT HEART CATH AND CORONARY ANGIOGRAPHY;  Surgeon: Elder Negus, MD;  Location: [facility];  Service: Cardiovascular;  Laterality: N/A;    Social History   Socioeconomic History   Marital status: Divorced    Spouse name: Not on file   Number of children: 2   Years of education: Not on file   Highest education level: Not on file  Occupational History   Occupation: Textile    Employer: [employer]  Tobacco Use   Smoking status: Former    Packs/day: 1.00    Types: Cigarettes    Quit date: 06/23/2020    Years since quitting: 0.9   Smokeless tobacco: Never  Vaping Use   Vaping Use: Never used  Substance and Sexual Activity   Alcohol use:  No    Alcohol/week: 0.0 standard drinks   Drug use: No   Sexual activity: Not on file  Other Topics Concern   Not on file  Social History Narrative   Divorced x many years   Social Determinants of Health   Financial Resource Strain: Low Risk    Difficulty of Paying Living Expenses: Not hard at all  Food Insecurity: No Food Insecurity   Worried About Programme researcher, broadcasting/film/video in the Last Year: Never true   Barista in the Last Year: Never true  Transportation Needs: No Transportation Needs   Lack of Transportation (Medical): No   Lack of Transportation (Non-Medical): No  Physical Activity: Inactive   Days of Exercise per Week: 0 days   Minutes of Exercise per Session: 0 min  Stress: No Stress Concern Present   Feeling of Stress : Not at all  Social Connections: Moderately Isolated   Frequency of Communication with Friends and Family: More than three times a week   Frequency of Social Gatherings with Friends and Family: Once a week   Attends Religious Services: Never   Database administrator or Organizations: No   Attends Banker Meetings: Never   Marital Status: Living with partner  Intimate Partner Violence: Not on file    Current Outpatient Medications on File Prior to Visit  Medication Sig Dispense Refill  Accu-Chek FastClix Lancets MISC USE TO CHECK BLOOD SUGAR AS DIRECTED ONCE DAILY 102 each 2   Accu-Chek FastClix Lancets MISC USE TO CHECK BLOOD SUGAR AS DIRECTED ONCE DAILY 102 each 2   apixaban (ELIQUIS) 5 MG TABS tablet Take 1 tablet (5 mg total) by mouth 2 (two) times daily. 180 tablet 3   Candesartan Cilexetil-HCTZ 32-25 MG TABS TAKE 1 TABLET BY MOUTH EVERY DAY 90 tablet 1   diltiazem (CARDIZEM CD) 180 MG 24 hr capsule Take 1 capsule (180 mg total) by mouth daily. 90 capsule 3   hydrALAZINE (APRESOLINE) 100 MG tablet Take 1 tablet (100 mg total) by mouth 3 (three) times daily. 90 tablet 3   isosorbide dinitrate (ISORDIL) 20 MG tablet Take 1 tablet (20  mg total) by mouth 3 (three) times daily. 270 tablet 0   metoprolol tartrate (LOPRESSOR) 50 MG tablet TAKE 1 TABLET(50 MG) BY MOUTH TWICE DAILY 180 tablet 3   repaglinide (PRANDIN) 1 MG tablet Take 1 tablet (1 mg total) by mouth daily with supper. 90 tablet 3   rosuvastatin (CRESTOR) 5 MG tablet Take 1 tablet (5 mg total) by mouth at bedtime. 1 tab by mouth once daily 90 tablet 3   nitroGLYCERIN (NITROSTAT) 0.4 MG SL tablet Place 1 tablet (0.4 mg total) under the tongue every 5 (five) minutes as needed for chest pain. 90 tablet 3   No current facility-administered medications on file prior to visit.    No Known Allergies  Family History  Problem Relation Age of Onset   Cancer Mother        had uncertain type of cancer   Stroke Sister     BP (!) 118/50 (BP Location: Right Arm, Patient Position: Sitting, Cuff Size: Large)   Pulse 68   Ht 5\' 11"  (1.803 m)   Wt 245 lb (111.1 kg)   SpO2 95%   BMI 34.17 kg/m    Review of Systems He denies hypoglycemia.      Objective:   Physical Exam  Lab Results  Component Value Date   CREATININE 1.57 (H) 05/11/2021   BUN 32 (H) 05/11/2021   NA 139 05/11/2021   K 3.2 (L) 05/11/2021   CL 103 05/11/2021   CO2 29 05/11/2021    Lab Results  Component Value Date   HGBA1C 6.0 05/11/2021      Assessment & Plan:  Type 2 DM: well-controlled.    Patient Instructions  Please continue the same repaglinide. check your blood sugar once a day.  vary the time of day when you check, between before the 3 meals, and at bedtime.  also check if you have symptoms of your blood sugar being too high or too low.  please keep a record of the readings and bring it to your next appointment here (or you can bring the meter itself).  You can write it on any piece of paper.  please call 05/13/2021 sooner if your blood sugar goes below 70, or if you have a lot of readings over 200.   Please come back for a follow-up appointment in 6 months.

## 2021-07-05 ENCOUNTER — Telehealth: Payer: Self-pay | Admitting: Internal Medicine

## 2021-07-05 NOTE — Telephone Encounter (Signed)
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

## 2021-07-05 NOTE — Telephone Encounter (Signed)
Patient states his current endocrinologist will no longer accept his insurance  Patient requesting a call back to discuss is MD can manage his diabetes plan  Please call [phone]

## 2021-07-06 NOTE — Telephone Encounter (Signed)
Please disregard as patient has taken care of everything with insurance.

## 2021-07-19 ENCOUNTER — Telehealth: Payer: Self-pay | Admitting: Student

## 2021-07-19 ENCOUNTER — Other Ambulatory Visit: Payer: Self-pay

## 2021-07-19 MED ORDER — ISOSORBIDE DINITRATE 20 MG PO TABS: 20.0000 mg | ORAL_TABLET | Freq: Three times a day (TID) | ORAL | 0 refills | Status: DC

## 2021-07-19 NOTE — Telephone Encounter (Signed)
Patient currently out of isosorbide 20 MG, requesting refill. Would like to have it sent to [facility] on [address] near [employer].

## 2021-07-19 NOTE — Telephone Encounter (Signed)
Refill sent to [facility] on [address].

## 2021-07-26 ENCOUNTER — Telehealth: Payer: Self-pay

## 2021-07-26 ENCOUNTER — Other Ambulatory Visit: Payer: Self-pay

## 2021-07-26 MED ORDER — APIXABAN 5 MG PO TABS: 5.0000 mg | ORAL_TABLET | Freq: Two times a day (BID) | ORAL | 3 refills | Status: DC

## 2021-07-26 NOTE — Telephone Encounter (Signed)
Patient assistance application sent for eliquis to [facility]

## 2021-07-27 ENCOUNTER — Other Ambulatory Visit: Payer: Self-pay

## 2021-07-27 MED ORDER — CANDESARTAN CILEXETIL-HCTZ 32-25 MG PO TABS: 1.0000 | ORAL_TABLET | Freq: Every day | ORAL | 3 refills | Status: DC

## 2021-08-23 ENCOUNTER — Telehealth: Payer: Self-pay | Admitting: Internal Medicine

## 2021-08-23 NOTE — Progress Notes (Deleted)
Primary Physician/Referring:  [name], MD  Patient ID: Roberto Rivera, male    DOB: July 27, 1951, 71 y.o.   MRN: [id number]  No chief complaint on file.  HPI:    Roberto Rivera  is a 71 y.o. AA male with history of type 2 diabetes, stage III chronic kidney disease, hypertension, hyperlipidemia, and tobacco use (smokes pack/day), and paroxysmal atrial fibrillation. He presented to [facility] ED 06/30/2020 with complaints of left hand numbness and weakness. He was diagnosed with small acute cerebral infarct. Cardiac monitoring revealed atrial fibrillation as the likely etiology, he is not tolerating long-term anticoagulation.  Patient underwent high risk nuclear stress testing 07/2020, and on 09/28/2020 repeat cardiac catheterization and angiography revealed normal coronary arteries with the exception of RCA disease, recommended medical management.  Patient presents for 30-month follow-up of hypertension, hyperlipidemia, and paroxysmal atrial fibrillation.  Last office visit increase hydralazine from 50 mg to 100 mg 3 times daily. ***  ***Willing to have sleep study done?   Patient presents for 6-week follow-up of hypertension and fatigue.  At last office visit added isosorbide dinitrate 20 mg 3 times daily with hydralazine.  Since last office visit patient's PCP increased hydralazine from 25 mg to 50 mg 3 times daily.  Patient brings with him a written log of home blood pressure readings, which he takes in the morning prior to taking his antihypertensive medications.  Review of written log shows blood pressure remains uncontrolled.  Blood pressure is also elevated in the office today.  Patient does admit to dietary noncompliance and he has not lost weight since last office visit.  In regard to sleep habits, he is sleeping better and has less daytime fatigue.  However would still likely benefit from sleep apnea evaluation although he prefers to hold off at this time.  Past Medical History:  Diagnosis Date    ALLERGIC RHINITIS 04/01/2007   Diabetes mellitus    DIABETES MELLITUS, TYPE II 04/01/2007   ERECTILE DYSFUNCTION, ORGANIC 02/07/2008   Hypercholesteremia    HYPERCHOLESTEROLEMIA 02/07/2008   Hypertension    HYPERTENSION 04/01/2007   MICROCYTOSIS 02/07/2008   OSTEOARTHRITIS, SPINE 02/07/2008   PAF (paroxysmal atrial fibrillation) ([place]) 07/11/2020   Stroke ([place])    THYROID CYST 02/07/2008   Past Surgical History:  Procedure Laterality Date   cyst removal  01/2021   back   LEFT HEART CATH AND CORONARY ANGIOGRAPHY N/A 09/28/2020   Procedure: LEFT HEART CATH AND CORONARY ANGIOGRAPHY;  Surgeon: Nigel Mormon, MD;  Location: [facility];  Service: Cardiovascular;  Laterality: N/A;   Family History  Problem Relation Age of Onset   Cancer Mother        had uncertain type of cancer   Stroke Sister     Social History   Tobacco Use   Smoking status: Former    Packs/day: 1.00    Types: Cigarettes    Quit date: 06/23/2020    Years since quitting: 1.1   Smokeless tobacco: Never  Substance Use Topics   Alcohol use: No    Alcohol/week: 0.0 standard drinks   Marital Status: Married   ROS  Review of Systems  Constitutional: Positive for weight gain (stable). Negative for malaise/fatigue (improved).  Cardiovascular:  Negative for chest pain, claudication, leg swelling, near-syncope, orthopnea, palpitations, paroxysmal nocturnal dyspnea and syncope.  Respiratory:  Negative for shortness of breath.   Hematologic/Lymphatic: Does not bruise/bleed easily.   Objective  There were no vitals taken for this visit.  Vitals with  BMI 06/15/2021 05/25/2021 05/11/2021  Height 5\' 11"  5\' 11"  5\' 11"   Weight 245 lbs 245 lbs 246 lbs  BMI 34.19 [id number]  Systolic 123456 [id number]  Diastolic 50 71 68  Pulse 68 59 59      Physical Exam Vitals reviewed.  Constitutional:      Appearance: He is obese.  HENT:     Head: Normocephalic and atraumatic.  Cardiovascular:     Rate and Rhythm:  Normal rate and regular rhythm.     Pulses: Intact distal pulses.     Heart sounds: S1 normal and S2 normal. No murmur heard.   No gallop.     Comments: No JVD.  Pulmonary:     Effort: Pulmonary effort is normal. No respiratory distress.     Breath sounds: No wheezing, rhonchi or rales.  Musculoskeletal:     Right lower leg: No edema.     Left lower leg: No edema.  Skin:    General: Skin is warm and dry.  Neurological:     Mental Status: He is alert.  Physical exam unchanged compared to previous. Laboratory examination:   Recent Labs    09/01/20 1006 09/28/20 1040 05/11/21 0923  NA 138 138 139  K 3.7 3.1* 3.2*  CL 100 102 103  CO2 33* 24 29  GLUCOSE 89 117* 129*  BUN 35* 24* 32*  CREATININE 1.71* 1.53* 1.57*  CALCIUM 9.8 9.1 9.4  GFRNONAA  --  49*  --     CrCl cannot be calculated (Patient's most recent lab result is older than the maximum 21 days allowed.).  CMP Latest Ref Rng & Units 05/11/2021 09/28/2020 09/01/2020  Glucose 70 - 99 mg/dL 129(H) 117(H) 89  BUN 6 - 23 mg/dL 32(H) 24(H) 35(H)  Creatinine 0.40 - 1.50 mg/dL 1.57(H) 1.53(H) 1.71(H)  Sodium 135 - 145 mEq/L 139 138 138  Potassium 3.5 - 5.1 mEq/L 3.2(L) 3.1(L) 3.7  Chloride 96 - 112 mEq/L 103 102 100  CO2 19 - 32 mEq/L 29 24 33(H)  Calcium 8.4 - 10.5 mg/dL 9.4 9.1 9.8  Total Protein 6.0 - 8.3 g/dL 7.5 - 7.2  Total Bilirubin 0.2 - 1.2 mg/dL 0.5 - 0.6  Alkaline Phos 39 - 117 U/L 82 - 92  AST 0 - 37 U/L 14 - 14  ALT 0 - 53 U/L 12 - 12   CBC Latest Ref Rng & Units 09/01/2020 07/06/2020 07/04/2020  WBC 4.0 - 10.5 K/uL 9.4 7.2 6.9  Hemoglobin 13.0 - 17.0 g/dL 15.0 17.0 15.2  Hematocrit 39.0 - 52.0 % 45.8 52.1(H) 48.5  Platelets 150.0 - 400.0 K/uL 216.0 210.0 210    Lipid Panel Recent Labs    09/01/20 1006 05/11/21 0923  CHOL 100 113  TRIG 66.0 58.0  LDLCALC 48 56  VLDL 13.2 11.6  HDL 39.10 45.60  CHOLHDL 3 2     HEMOGLOBIN A1C Lab Results  Component Value Date   HGBA1C 6.0 05/11/2021   MPG  162.81 06/30/2020   TSH Recent Labs    09/01/20 1006  TSH 1.66     External labs:  None   Allergies  No Known Allergies   Medications Prior to Visit:   Outpatient Medications Prior to Visit  Medication Sig Dispense Refill   Accu-Chek FastClix Lancets MISC USE TO CHECK BLOOD SUGAR AS DIRECTED ONCE DAILY 102 each 2   Accu-Chek FastClix Lancets MISC USE TO CHECK BLOOD SUGAR AS DIRECTED ONCE DAILY 102 each 2   apixaban (ELIQUIS)  5 MG TABS tablet Take 1 tablet (5 mg total) by mouth 2 (two) times daily. 180 tablet 3   Candesartan Cilexetil-HCTZ 32-25 MG TABS Take 1 tablet by mouth daily. 90 tablet 3   diltiazem (CARDIZEM CD) 180 MG 24 hr capsule Take 1 capsule (180 mg total) by mouth daily. 90 capsule 3   glucose blood (ACCU-CHEK GUIDE) test strip 1 each by Other route daily. And lancets 1/day 100 strip 3   hydrALAZINE (APRESOLINE) 100 MG tablet Take 1 tablet (100 mg total) by mouth 3 (three) times daily. 90 tablet 3   isosorbide dinitrate (ISORDIL) 20 MG tablet Take 1 tablet (20 mg total) by mouth 3 (three) times daily. 270 tablet 0   metoprolol tartrate (LOPRESSOR) 50 MG tablet TAKE 1 TABLET(50 MG) BY MOUTH TWICE DAILY 180 tablet 3   nitroGLYCERIN (NITROSTAT) 0.4 MG SL tablet Place 1 tablet (0.4 mg total) under the tongue every 5 (five) minutes as needed for chest pain. 90 tablet 3   repaglinide (PRANDIN) 1 MG tablet Take 1 tablet (1 mg total) by mouth daily with supper. 90 tablet 3   rosuvastatin (CRESTOR) 5 MG tablet Take 1 tablet (5 mg total) by mouth at bedtime. 1 tab by mouth once daily 90 tablet 3   No facility-administered medications prior to visit.   Final Medications at End of Visit    No outpatient medications have been marked as taking for the 08/24/21 encounter (Appointment) with Carlynn Purl, Kirby Cortese C, PA-C.    Radiology:   No results found.  Cardiac Studies:   Left heart cath and coronary angiography 09/28/2020:  LM: Large, normal LAD: Large, minimal luminal  irregularities Ramus: Large, normal LCx: Large, normal RCA:  Prox calcific 50% stenosis, distal RCA subtotal occlusion with TIMI 1 flow   PCV MYOCARDIAL PERFUSION WO LEXISCAN 08/04/2020 Nondiagnostic ECG stress due to pharmacologic stress. Resting EKG demonstrated atrial fibrillation with rapid ventricular response. Non-specific ST-T abnormality. Peak EKG/ demonstrated atrial fibrillation with rapid ventricular response. There is a reversible moderate defect in the lateral and inferior regions. consistent with severe ischemia. Overall LV systolic function is abnormal with regional wall motion abnormalities in the same area.  Stress LV EF: 34%. No previous exam available for comparison. High risk study.  Venous Bilateral Ultrasound 07/02/2020: RIGHT:  - There is no evidence of deep vein thrombosis in the lower extremity.  - No cystic structure found in the popliteal fossa.  LEFT:  - There is no evidence of deep vein thrombosis in the lower extremity.  - No cystic structure found in the popliteal fossa.  ECHOCARDIOGRAM COMPLETE 07/01/2020 1. Left ventricular ejection fraction, by estimation, is 60 to 65%. The left ventricle has normal function. The left ventricle has no regional wall motion abnormalities. There is mild left ventricular hypertrophy. Left ventricular diastolic parameters are consistent with Grade I diastolic dysfunction (impaired relaxation).   2. Right ventricular systolic function is normal. The right ventricular size is normal. Tricuspid regurgitation signal is inadequate for assessing PA pressure.   3. Left atrial size was severely dilated.   4. The mitral valve is normal in structure. No evidence of mitral valve regurgitation. No evidence of mitral stenosis. There was chordal systolic anterior motion noted (not valvular).  5. The aortic valve is tricuspid. Aortic valve regurgitation is mild. Mild aortic valve stenosis. Aortic valve mean gradient measures 14.0 mmHg.   6.  Aortic dilatation noted. There is mild dilatation of the ascending aorta, measuring 40 mm.  7. The inferior vena  cava is normal in size with greater than 50% respiratory variability, suggesting right atrial pressure of 3 mmHg.   VAS US CAROTID 07/01/2020 Right Carotid: Velocities in the right ICA are consistent with a 1-39% stenosis.  Left Carotid: Velocities in the left ICA are consistent with a 1-39% stenosis.  Vertebrals:  Bilateral vertebral arteries demonstrate antegrade flow.  Subclavians: Normal flow hemodynamics were seen in bilateral subclavian arteries.   EKG:  04/13/2021: Sinus bradycardia at a rate of 51 bpm.  Left atrial enlargement.  Left axis, left anterior fascicular block.  Nonspecific T wave abnormality.  Compared to EKG 09/20/2020, no poor R wave progression  07/12/2020: sinus bradycardia at a rate of 57 bpm with borderline 1st degree AV block, left atrial enlargement. Normal axis.  Nonspecific T wave abnormality.  07/02/2020: Sinus rhythm at a rate of 72 bpm, normal axis.  T wave inversions lateral leads, cannot exclude ischemia.   07/01/2020: Atrial fibrillation with rapid ventricular response at a rate of 115 bpm.  Normal axis.  Assessment   No diagnosis found.    There are no discontinued medications.   No orders of the defined types were placed in this encounter.  This patients CHA2DS2-VASc Score 5 (HTN, DM, Age, Stroke) and yearly risk of stroke 7.2%.   Recommendations:   Izai Naden is a 71 y.o. . AA male with history of type 2 diabetes, stage III chronic kidney disease, hypertension, hyperlipidemia, and tobacco use, and paroxysmal atrial fibrillation. Patient underwent high risk nuclear stress testing 07/2020, and is a current cardiac catheterization and angiography revealed normal coronary arteries with the exception of RCA disease, recommended medical management.  Patient presents for 97-month follow-up of hypertension, hyperlipidemia, and paroxysmal atrial  fibrillation.  Last office visit increase hydralazine from 50 mg to 100 mg 3 times daily. ***  ***Willing to have sleep study done? Lipids controlled   Patient presents for 6-week follow-up of hypertension and fatigue.  At last office visit added isosorbide dinitrate 20 mg 3 times daily with hydralazine.  Since last office visit patient's PCP increased hydralazine from 25 mg to 50 mg 3 times daily.  Patient's blood pressure remains uncontrolled.  We will increase hydralazine from 50 mg to 100 mg 3 times daily.  Encourage patient to continue to monitor his blood pressure on a daily basis at home, however asked that he monitor his blood pressure later in the day so that he has taken his antihypertensive medications prior to checking blood pressure.  She will continue to monitor blood pressure on a daily basis and notify our office if it remains >130/80 mmHg.  Again encourage patient to consider sleep apnea evaluation in the future.  Discussed at length with patient regarding the importance of diet and lifestyle modifications.  Reiterated need to comply with DASH diet as well as lose significant weight in order to improve blood pressure control and reduce overall cardiovascular risk.  In regard to atrial fibrillation, he has had no known recurrence since last visit and continues to tolerate anticoagulation without bleeding diathesis.  Follow-up in 3 months, sooner if needed, for hypertension, hyperlipidemia, paroxysmal atrial fibrillation.   Alethia Berthold, PA-C 08/23/2021, 2:10 PM Office: [phone]

## 2021-08-23 NOTE — Telephone Encounter (Signed)
Patient calling in  Patient covid+ via at home test 01.03.23  Patient experiencing minimum symptoms.. wants to know if antiviral is going to cleat up the symptoms  Pharmacy  [facility] STORE #[id number] [place], [place] - [address] AT [facility]  Phone:  [phone] Fax:  [phone]

## 2021-08-24 ENCOUNTER — Ambulatory Visit: Payer: Medicare Other | Admitting: Student

## 2021-08-24 MED ORDER — NIRMATRELVIR/RITONAVIR (PAXLOVID) TABLET (RENAL DOSING): 2.0000 | ORAL_TABLET | Freq: Two times a day (BID) | ORAL | 0 refills | Status: AC

## 2021-08-24 NOTE — Telephone Encounter (Signed)
Ok for renal dosed paxlovid - I will send  Pt should HOLD crestor for 10 days when starting the palovid  Also only take HALF ELIQUIS (2.5 bid) while taking this

## 2021-08-24 NOTE — Telephone Encounter (Signed)
Connected to [facility] 1.3.2023.    Caller states he has tested positive for covid on a home test. He has congestion, stuffy nose, coughing. Loss of taste. No fever.   Advised home care.

## 2021-08-24 NOTE — Telephone Encounter (Signed)
Patient notified and verbalizes understanding.

## 2021-08-25 ENCOUNTER — Telehealth (INDEPENDENT_AMBULATORY_CARE_PROVIDER_SITE_OTHER): Payer: Medicare Other | Admitting: Family Medicine

## 2021-08-25 ENCOUNTER — Encounter: Payer: Self-pay | Admitting: Family Medicine

## 2021-08-25 ENCOUNTER — Other Ambulatory Visit: Payer: Self-pay

## 2021-08-25 DIAGNOSIS — U071 COVID-19: Secondary | ICD-10-CM

## 2021-08-25 NOTE — Progress Notes (Signed)
Virtual Visit via Telephone Note  I connected with Roberto Rivera on 08/25/21 at 11:40 AM EST by telephone and verified that I am speaking with the correct person using two identifiers.   I discussed the limitations of performing an evaluation and management service by telephone and requested permission for a phone visit. The patient expressed understanding and agreed to proceed.  Location patient:   Location provider: work or home office Participants present for the call: patient, provider Patient did not have a visit with me in the prior 7 days to address this/these issue(s).   History of Present Illness:  Acute telemedicine visit for Covid19: -Onset: 3-4 days ago; he tested positive for covid -girlfriend has covid too -Symptoms include: nasal congestion, cough -Denies:CP, SOB, NVD, inability to eat drink -reports he called for appt and PCP sent in renally dose Paxlovid, told him about interactions and advised him to stop his cholesterol medication and reduce his eliquis, reports he already started this -Pertinent past medical history: see below -Pertinent medication allergies: No Known Allergies -COVID-19 vaccine status: Immunization History  Administered Date(s) Administered   PFIZER(Purple Top)SARS-COV-2 Vaccination 04/23/2020, 05/14/2020   Pneumococcal Conjugate-13 12/25/2017   Pneumococcal Polysaccharide-23 02/04/2010, 01/28/2020   Td 01/29/2009   Zoster, Live 12/14/2011      Past Medical History:  Diagnosis Date   ALLERGIC RHINITIS 04/01/2007   Diabetes mellitus    DIABETES MELLITUS, TYPE II 04/01/2007   ERECTILE DYSFUNCTION, ORGANIC 02/07/2008   Hypercholesteremia    HYPERCHOLESTEROLEMIA 02/07/2008   Hypertension    HYPERTENSION 04/01/2007   MICROCYTOSIS 02/07/2008   OSTEOARTHRITIS, SPINE 02/07/2008   PAF (paroxysmal atrial fibrillation) ([place]) 07/11/2020   Stroke ([place])    THYROID CYST 02/07/2008    Current Outpatient Medications on File Prior to Visit  Medication Sig  Dispense Refill   Accu-Chek FastClix Lancets MISC USE TO CHECK BLOOD SUGAR AS DIRECTED ONCE DAILY 102 each 2   Accu-Chek FastClix Lancets MISC USE TO CHECK BLOOD SUGAR AS DIRECTED ONCE DAILY 102 each 2   apixaban (ELIQUIS) 5 MG TABS tablet Take 1 tablet (5 mg total) by mouth 2 (two) times daily. 180 tablet 3   Candesartan Cilexetil-HCTZ 32-25 MG TABS Take 1 tablet by mouth daily. 90 tablet 3   diltiazem (CARDIZEM CD) 180 MG 24 hr capsule Take 1 capsule (180 mg total) by mouth daily. 90 capsule 3   glucose blood (ACCU-CHEK GUIDE) test strip 1 each by Other route daily. And lancets 1/day 100 strip 3   hydrALAZINE (APRESOLINE) 100 MG tablet Take 1 tablet (100 mg total) by mouth 3 (three) times daily. 90 tablet 3   isosorbide dinitrate (ISORDIL) 20 MG tablet Take 1 tablet (20 mg total) by mouth 3 (three) times daily. 270 tablet 0   metoprolol tartrate (LOPRESSOR) 50 MG tablet TAKE 1 TABLET(50 MG) BY MOUTH TWICE DAILY 180 tablet 3   nirmatrelvir/ritonavir EUA, renal dosing, (PAXLOVID) 10 x 150 MG & 10 x 100MG  TABS Take 2 tablets by mouth 2 (two) times daily for 5 days. (Take nirmatrelvir 150 mg one tablet twice daily for 5 days and ritonavir 100 mg one tablet twice daily for 5 days) Patient GFR is 45 20 tablet 0   repaglinide (PRANDIN) 1 MG tablet Take 1 tablet (1 mg total) by mouth daily with supper. 90 tablet 3   rosuvastatin (CRESTOR) 5 MG tablet Take 1 tablet (5 mg total) by mouth at bedtime. 1 tab by mouth once daily 90 tablet 3   nitroGLYCERIN (NITROSTAT) 0.4 MG  SL tablet Place 1 tablet (0.4 mg total) under the tongue every 5 (five) minutes as needed for chest pain. 90 tablet 3   No current facility-administered medications on file prior to visit.    Observations/Objective: Patient sounds cheerful and well on the phone. I do not appreciate any SOB. Speech and thought processing are grossly intact. Patient reported vitals:  Assessment and Plan:  COVID-19  - Discussed treatment options and  risk of drug interactions (reviewed again with him, though he reports PCP office already reviewed theses with him as well), ideal treatment window, potential complications,contagious period isolation and precautions for COVID-19.  Other symptomatic care measures summarized in patient instructions. Nasal saline sinus rinse. He had questions about how to take the Paxlovid (how many of each pill, how many times per day, etc.) Answered questions. Advised to watch for signs symptoms of drug interactions and to contact PCP or seek medical care if any concerns.  Advised to seek prompt virtual visit or in person care if worsening, new symptoms arise, or if is not improving with treatment as expected per our conversation of expected course. Discussed options for follow up care. Did let this patient know that I do telemedicine on Tuesdays and Thursdays for [facility] and those are the days I am logged into the system. Advised to schedule follow up visit with PCP, [facility] [place] virtual visits or [facility] if any further questions or concerns to avoid delays in care.   I discussed the assessment and treatment plan with the patient. The patient was provided an opportunity to ask questions and all were answered. The patient agreed with the plan and demonstrated an understanding of the instructions.    Follow Up Instructions:  I did not refer this patient for an OV with me in the next 24 hours for this/these issue(s).  I discussed the assessment and treatment plan with the patient. The patient was provided an opportunity to ask questions and all were answered. The patient agreed with the plan and demonstrated an understanding of the instructions.   I spent 14 minutes on the date of this visit in the care of this patient. See summary of tasks completed to properly care for this patient in the detailed notes above which also included counseling of above, review of PMH, medications, allergies, evaluation of the patient and ordering  and/or  instructing patient on testing and care options.     Lucretia Kern, DO

## 2021-08-25 NOTE — Patient Instructions (Addendum)
°  HOME CARE TIPS:  -COVID19 testing information: GoldAgenda.is  Most pharmacies also offer testing and home test kits. If the Covid19 test is positive and you desire antiviral treatment, please contact a [facility] pharmacy or schedule a follow up virtual visit through your primary care office or through the [employer].  Other test to treat options: http://www.vasquez-vaughn.biz/?click_source=alert  -Follow Dr. Raphael Gibney recommendations regarding the Paxlovid. Contact Dr. Jonny Ruiz or seek medical care if any concerns.   -can use tylenol if needed for fevers, aches and pains per instructions  -can use nasal saline a few times per day if you have nasal congestion  -stay hydrated, drink plenty of fluids and eat small healthy meals - avoid dairy  -follow up with your doctor in 2-3 days unless improving and feeling better  -stay home while sick, except to seek medical care. If you have COVID19, you will likely be contagious for 7-10 days. Flu or Influenza is likely contagious for about 7 days. Other respiratory viral infections remain contagious for 5-10+ days depending on the virus and many other factors. Wear a good mask that fits snugly (such as N95 or KN95) if around others to reduce the risk of transmission.  It was nice to meet you today, and I really hope you are feeling better soon. I help [facility] out with telemedicine visits on Tuesdays and Thursdays and am happy to help if you need a follow up virtual visit on those days. Otherwise, if you have any concerns or questions following this visit please schedule a follow up visit with your Primary Care doctor or seek care at a local urgent care clinic to avoid delays in care.    Seek in person care or schedule a follow up video visit promptly if your symptoms worsen, new concerns arise or you are not improving with treatment. Call 911 and/or seek emergency care if your symptoms are severe or life  threatening.

## 2021-08-31 DIAGNOSIS — H2513 Age-related nuclear cataract, bilateral: Secondary | ICD-10-CM | POA: Diagnosis not present

## 2021-09-29 ENCOUNTER — Other Ambulatory Visit: Payer: Medicare Other

## 2021-10-04 NOTE — Progress Notes (Signed)
Primary Physician/Referring:  [name], MD  Patient ID: Roberto Rivera, male    DOB: May 12, 1951, 71 y.o.   MRN: [id number]  Chief Complaint  Patient presents with   Hypertension   hld   paf    3 month   HPI:    Smauel Rivera  is a 71 y.o. AA male with history of type 2 diabetes, stage III chronic kidney disease, hypertension, hyperlipidemia, and tobacco use (smokes pack/day), and paroxysmal atrial fibrillation. He presented to [facility] ED 06/30/2020 with complaints of left hand numbness and weakness. He was diagnosed with small acute cerebral infarct. Cardiac monitoring revealed atrial fibrillation as the likely etiology, he is not tolerating long-term anticoagulation.  Patient underwent high risk nuclear stress testing 07/2020, and on 09/28/2020 repeat cardiac catheterization and angiography revealed normal coronary arteries with the exception of RCA disease, recommended medical management.  Patient presents for 59-month follow-up.  Last office visit increased hydralazine from 50 mg to 100 mg 3 times daily.  At last office visit patient remained resistant to recommended sleep apnea evaluation.  Patient is feeling well overall without specific complaints today.  He continues to tolerate anticoagulation palpation diathesis.  He brings a written log of home blood pressure monitoring, which reveals mildly elevated blood pressure in the mornings prior to taking his blood pressure medications.  Patient does admit to dietary noncompliance and he has unfortunately gained weight since last office visit.  Patient remains resistant to previously recommended sleep apnea evaluation.  Past Medical History:  Diagnosis Date   ALLERGIC RHINITIS 04/01/2007   Diabetes mellitus    DIABETES MELLITUS, TYPE II 04/01/2007   ERECTILE DYSFUNCTION, ORGANIC 02/07/2008   Hypercholesteremia    HYPERCHOLESTEROLEMIA 02/07/2008   Hypertension    HYPERTENSION 04/01/2007   MICROCYTOSIS 02/07/2008   OSTEOARTHRITIS, SPINE  02/07/2008   PAF (paroxysmal atrial fibrillation) ([place]) 07/11/2020   Stroke ([place])    THYROID CYST 02/07/2008   Family History  Problem Relation Age of Onset   Cancer Mother        had uncertain type of cancer   Stroke Sister     Social History   Tobacco Use   Smoking status: Former    Packs/day: 1.00    Types: Cigarettes    Quit date: 06/23/2020    Years since quitting: 1.2   Smokeless tobacco: Never  Substance Use Topics   Alcohol use: No    Alcohol/week: 0.0 standard drinks   Marital Status: Married   ROS  Review of Systems  Constitutional: Positive for weight gain. Negative for malaise/fatigue.  Cardiovascular:  Negative for chest pain, claudication, leg swelling, near-syncope, orthopnea, palpitations, paroxysmal nocturnal dyspnea and syncope.  Respiratory:  Negative for shortness of breath.   Hematologic/Lymphatic: Does not bruise/bleed easily.   Objective  Blood pressure 126/66, pulse (!) 57, temperature 98.3 F (36.8 C), temperature source Temporal, resp. rate 17, height 5\' 11"  (1.803 m), weight 247 lb 3.2 oz (112.1 kg), SpO2 98 %.  Vitals with BMI 10/06/2021 06/15/2021 05/25/2021  Height 5\' 11"  5\' 11"  5\' 11"   Weight 247 lbs 3 oz 245 lbs 245 lbs  BMI 34.49 [id number]  Systolic [id number]  Diastolic 66 50 71  Pulse 57 68 59      Physical Exam Vitals reviewed.  Constitutional:      Appearance: He is obese.  Neck:     Vascular: Carotid bruit (bilateral) present.  Cardiovascular:     Rate and Rhythm: Normal rate and regular rhythm.  Pulses: Intact distal pulses.     Heart sounds: S1 normal and S2 normal. No murmur heard.   No gallop.     Comments: No JVD.  Pulmonary:     Effort: Pulmonary effort is normal. No respiratory distress.     Breath sounds: No wheezing, rhonchi or rales.  Musculoskeletal:     Right lower leg: No edema.     Left lower leg: No edema.  Neurological:     Mental Status: He is alert.   Laboratory examination:   CrCl cannot be  calculated (Patient's most recent lab result is older than the maximum 21 days allowed.).  CMP Latest Ref Rng & Units 05/11/2021 09/28/2020 09/01/2020  Glucose 70 - 99 mg/dL 129(H) 117(H) 89  BUN 6 - 23 mg/dL 32(H) 24(H) 35(H)  Creatinine 0.40 - 1.50 mg/dL 1.57(H) 1.53(H) 1.71(H)  Sodium 135 - 145 mEq/L 139 138 138  Potassium 3.5 - 5.1 mEq/L 3.2(L) 3.1(L) 3.7  Chloride 96 - 112 mEq/L 103 102 100  CO2 19 - 32 mEq/L 29 24 33(H)  Calcium 8.4 - 10.5 mg/dL 9.4 9.1 9.8  Total Protein 6.0 - 8.3 g/dL 7.5 - 7.2  Total Bilirubin 0.2 - 1.2 mg/dL 0.5 - 0.6  Alkaline Phos 39 - 117 U/L 82 - 92  AST 0 - 37 U/L 14 - 14  ALT 0 - 53 U/L 12 - 12   CBC Latest Ref Rng & Units 09/01/2020 07/06/2020 07/04/2020  WBC 4.0 - 10.5 K/uL 9.4 7.2 6.9  Hemoglobin 13.0 - 17.0 g/dL 15.0 17.0 15.2  Hematocrit 39.0 - 52.0 % 45.8 52.1(H) 48.5  Platelets 150.0 - 400.0 K/uL 216.0 210.0 210    Lipid Panel Recent Labs    05/11/21 0923  CHOL 113  TRIG 58.0  LDLCALC 56  VLDL 11.6  HDL 45.60  CHOLHDL 2   HEMOGLOBIN A1C Lab Results  Component Value Date   HGBA1C 6.0 05/11/2021   MPG 162.81 06/30/2020   TSH No results for input(s): TSH in the last 8760 hours.   External labs:  None   Allergies  No Known Allergies   Medications Prior to Visit:   Outpatient Medications Prior to Visit  Medication Sig Dispense Refill   Accu-Chek FastClix Lancets MISC USE TO CHECK BLOOD SUGAR AS DIRECTED ONCE DAILY 102 each 2   Accu-Chek FastClix Lancets MISC USE TO CHECK BLOOD SUGAR AS DIRECTED ONCE DAILY 102 each 2   ammonium lactate (AMLACTIN) 12 % cream      apixaban (ELIQUIS) 5 MG TABS tablet Take 1 tablet (5 mg total) by mouth 2 (two) times daily. 180 tablet 3   Candesartan Cilexetil-HCTZ 32-25 MG TABS Take 1 tablet by mouth daily. 90 tablet 3   glucose blood (ACCU-CHEK GUIDE) test strip 1 each by Other route daily. And lancets 1/day 100 strip 3   metoprolol tartrate (LOPRESSOR) 50 MG tablet TAKE 1 TABLET(50 MG) BY MOUTH  TWICE DAILY 180 tablet 3   repaglinide (PRANDIN) 1 MG tablet Take 1 tablet (1 mg total) by mouth daily with supper. 90 tablet 3   rosuvastatin (CRESTOR) 5 MG tablet Take 1 tablet (5 mg total) by mouth at bedtime. 1 tab by mouth once daily 90 tablet 3   diltiazem (CARDIZEM CD) 180 MG 24 hr capsule Take 1 capsule (180 mg total) by mouth daily. 90 capsule 3   hydrALAZINE (APRESOLINE) 100 MG tablet Take 1 tablet (100 mg total) by mouth 3 (three) times daily. 90 tablet 3   isosorbide  dinitrate (ISORDIL) 20 MG tablet Take 1 tablet (20 mg total) by mouth 3 (three) times daily. 270 tablet 0   nitroGLYCERIN (NITROSTAT) 0.4 MG SL tablet Place 1 tablet (0.4 mg total) under the tongue every 5 (five) minutes as needed for chest pain. 90 tablet 3   No facility-administered medications prior to visit.   Final Medications at End of Visit    Current Meds  Medication Sig   Accu-Chek FastClix Lancets MISC USE TO CHECK BLOOD SUGAR AS DIRECTED ONCE DAILY   Accu-Chek FastClix Lancets MISC USE TO CHECK BLOOD SUGAR AS DIRECTED ONCE DAILY   ammonium lactate (AMLACTIN) 12 % cream    apixaban (ELIQUIS) 5 MG TABS tablet Take 1 tablet (5 mg total) by mouth 2 (two) times daily.   Candesartan Cilexetil-HCTZ 32-25 MG TABS Take 1 tablet by mouth daily.   glucose blood (ACCU-CHEK GUIDE) test strip 1 each by Other route daily. And lancets 1/day   metoprolol tartrate (LOPRESSOR) 50 MG tablet TAKE 1 TABLET(50 MG) BY MOUTH TWICE DAILY   repaglinide (PRANDIN) 1 MG tablet Take 1 tablet (1 mg total) by mouth daily with supper.   rosuvastatin (CRESTOR) 5 MG tablet Take 1 tablet (5 mg total) by mouth at bedtime. 1 tab by mouth once daily   [DISCONTINUED] diltiazem (CARDIZEM CD) 180 MG 24 hr capsule Take 1 capsule (180 mg total) by mouth daily.   [DISCONTINUED] hydrALAZINE (APRESOLINE) 100 MG tablet Take 1 tablet (100 mg total) by mouth 3 (three) times daily.   [DISCONTINUED] isosorbide dinitrate (ISORDIL) 20 MG tablet Take 1 tablet  (20 mg total) by mouth 3 (three) times daily.    Radiology:   No results found.  Cardiac Studies:   Left heart cath and coronary angiography 09/28/2020:  LM: Large, normal LAD: Large, minimal luminal irregularities Ramus: Large, normal LCx: Large, normal RCA:  Prox calcific 50% stenosis, distal RCA subtotal occlusion with TIMI 1 flow  PCV MYOCARDIAL PERFUSION WO LEXISCAN 08/04/2020 Nondiagnostic ECG stress due to pharmacologic stress. Resting EKG demonstrated atrial fibrillation with rapid ventricular response. Non-specific ST-T abnormality. Peak EKG/ demonstrated atrial fibrillation with rapid ventricular response. There is a reversible moderate defect in the lateral and inferior regions. consistent with severe ischemia. Overall LV systolic function is abnormal with regional wall motion abnormalities in the same area.  Stress LV EF: 34%. No previous exam available for comparison. High risk study.  Venous Bilateral Ultrasound 07/02/2020: RIGHT:  - There is no evidence of deep vein thrombosis in the lower extremity.  - No cystic structure found in the popliteal fossa.  LEFT:  - There is no evidence of deep vein thrombosis in the lower extremity.  - No cystic structure found in the popliteal fossa.  ECHOCARDIOGRAM COMPLETE 07/01/2020 1. Left ventricular ejection fraction, by estimation, is 60 to 65%. The left ventricle has normal function. The left ventricle has no regional wall motion abnormalities. There is mild left ventricular hypertrophy. Left ventricular diastolic parameters are consistent with Grade I diastolic dysfunction (impaired relaxation).   2. Right ventricular systolic function is normal. The right ventricular size is normal. Tricuspid regurgitation signal is inadequate for assessing PA pressure.   3. Left atrial size was severely dilated.   4. The mitral valve is normal in structure. No evidence of mitral valve regurgitation. No evidence of mitral stenosis. There was  chordal systolic anterior motion noted (not valvular).  5. The aortic valve is tricuspid. Aortic valve regurgitation is mild. Mild aortic valve stenosis. Aortic valve mean gradient  measures 14.0 mmHg.   6. Aortic dilatation noted. There is mild dilatation of the ascending aorta, measuring 40 mm.  7. The inferior vena cava is normal in size with greater than 50% respiratory variability, suggesting right atrial pressure of 3 mmHg.   VAS US CAROTID 07/01/2020 Right Carotid: Velocities in the right ICA are consistent with a 1-39% stenosis.  Left Carotid: Velocities in the left ICA are consistent with a 1-39% stenosis.  Vertebrals:  Bilateral vertebral arteries demonstrate antegrade flow.  Subclavians: Normal flow hemodynamics were seen in bilateral subclavian arteries.   EKG:  10/06/2021: Sinus rhythm at a rate of 57 bpm.  Left axis.  Left atrial enlargement.  Left anterior fascicular block.  Nonspecific T wave abnormality.  Compared EKG 04/13/2021, no significant change.  07/12/2020: sinus bradycardia at a rate of 57 bpm with borderline 1st degree AV block, left atrial enlargement. Normal axis.  Nonspecific T wave abnormality.  07/02/2020: Sinus rhythm at a rate of 72 bpm, normal axis.  T wave inversions lateral leads, cannot exclude ischemia.   07/01/2020: Atrial fibrillation with rapid ventricular response at a rate of 115 bpm.  Normal axis.  Assessment     ICD-10-CM   1. PAF (paroxysmal atrial fibrillation) (HCC)  I48.0 EKG 12-Lead    2. Essential hypertension  I10     3. Coronary artery disease involving native coronary artery of native heart without angina pectoris  I25.10     4. Hypercholesteremia  E78.00 PCV CAROTID DUPLEX (BILATERAL)    5. Bilateral carotid bruits  R09.89 PCV CAROTID DUPLEX (BILATERAL)       Medications Discontinued During This Encounter  Medication Reason   diltiazem (CARDIZEM CD) 180 MG 24 hr capsule    hydrALAZINE (APRESOLINE) 100 MG tablet Reorder    isosorbide dinitrate (ISORDIL) 20 MG tablet Reorder    Meds ordered this encounter  Medications   diltiazem (CARDIZEM CD) 180 MG 24 hr capsule    Sig: Take 1 capsule (180 mg total) by mouth every evening.    Dispense:  90 capsule    Refill:  3   hydrALAZINE (APRESOLINE) 100 MG tablet    Sig: Take 1 tablet (100 mg total) by mouth 3 (three) times daily.    Dispense:  270 tablet    Refill:  3   isosorbide dinitrate (ISORDIL) 20 MG tablet    Sig: Take 1 tablet (20 mg total) by mouth 3 (three) times daily.    Dispense:  270 tablet    Refill:  3   This patients CHA2DS2-VASc Score 5 (HTN, DM, Age, Stroke) and yearly risk of stroke 7.2%.   Recommendations:   Fard Jacek is a 71 y.o. . AA male with history of type 2 diabetes, stage III chronic kidney disease, hypertension, hyperlipidemia, and tobacco use, and paroxysmal atrial fibrillation. Patient underwent high risk nuclear stress testing 07/2020, and is a current cardiac catheterization and angiography revealed normal coronary arteries with the exception of RCA disease, recommended medical management.  Patient presents for 49-month follow-up.  Last office visit increased hydralazine from 50 mg to 100 mg 3 times daily.  At last office visit patient remained resistant to recommended sleep apnea evaluation.  Patient is feeling well overall and continues to tolerate anticoagulation without bleeding diathesis.  Blood pressure is overall well controlled, however morning blood pressures are mildly elevated prior to taking his medications, therefore advised patient to switch diltiazem from morning to evening.  Will not make any other changes to medications at this  time.  I personally reviewed external labs, lipids are well controlled and A1c is 6.0%.  Again reiterated the importance of DASH diet as well as weight loss and increasing physical activity.  Patient has had no known recurrence of atrial fibrillation.  EKG is unchanged compared to previous.   However there are faint bilateral carotid bruits on exam today.  We will therefore obtain carotid artery duplex.  Patient is otherwise stable from a cardiovascular standpoint.  Follow-up in 6 months, sooner if needed.   Alethia Berthold, PA-C 10/06/2021, 11:12 AM Office: [phone]

## 2021-10-06 ENCOUNTER — Other Ambulatory Visit: Payer: Self-pay

## 2021-10-06 ENCOUNTER — Ambulatory Visit: Payer: Medicare Other | Admitting: Student

## 2021-10-06 ENCOUNTER — Encounter: Payer: Self-pay | Admitting: Student

## 2021-10-06 VITALS — BP 126/66 | HR 57 | Temp 98.3°F | Resp 17 | Ht 71.0 in | Wt 247.2 lb

## 2021-10-06 DIAGNOSIS — I10 Essential (primary) hypertension: Secondary | ICD-10-CM

## 2021-10-06 DIAGNOSIS — I2510 Atherosclerotic heart disease of native coronary artery without angina pectoris: Secondary | ICD-10-CM | POA: Diagnosis not present

## 2021-10-06 DIAGNOSIS — I480 Paroxysmal atrial fibrillation: Secondary | ICD-10-CM

## 2021-10-06 DIAGNOSIS — E7800 Pure hypercholesterolemia, unspecified: Secondary | ICD-10-CM

## 2021-10-06 DIAGNOSIS — R0989 Other specified symptoms and signs involving the circulatory and respiratory systems: Secondary | ICD-10-CM

## 2021-10-06 MED ORDER — DILTIAZEM HCL ER COATED BEADS 180 MG PO CP24
180.0000 mg | ORAL_CAPSULE | Freq: Every evening | ORAL | 3 refills | Status: DC
Start: 2021-10-06 — End: 2021-12-01

## 2021-10-06 MED ORDER — HYDRALAZINE HCL 100 MG PO TABS: 100.0000 mg | ORAL_TABLET | Freq: Three times a day (TID) | ORAL | 3 refills | Status: DC

## 2021-10-06 MED ORDER — ISOSORBIDE DINITRATE 20 MG PO TABS
20.0000 mg | ORAL_TABLET | Freq: Three times a day (TID) | ORAL | 3 refills | Status: DC
Start: 2021-10-06 — End: 2022-09-15

## 2021-10-14 ENCOUNTER — Other Ambulatory Visit: Payer: Self-pay

## 2021-10-14 ENCOUNTER — Ambulatory Visit (INDEPENDENT_AMBULATORY_CARE_PROVIDER_SITE_OTHER): Payer: Medicare Other | Admitting: Podiatry

## 2021-10-14 DIAGNOSIS — D689 Coagulation defect, unspecified: Secondary | ICD-10-CM

## 2021-10-14 DIAGNOSIS — B353 Tinea pedis: Secondary | ICD-10-CM

## 2021-10-14 DIAGNOSIS — B351 Tinea unguium: Secondary | ICD-10-CM

## 2021-10-14 DIAGNOSIS — M79675 Pain in left toe(s): Secondary | ICD-10-CM | POA: Diagnosis not present

## 2021-10-14 DIAGNOSIS — M79674 Pain in right toe(s): Secondary | ICD-10-CM

## 2021-10-14 MED ORDER — APIXABAN 5 MG PO TABS: 5.0000 mg | ORAL_TABLET | Freq: Two times a day (BID) | ORAL | 3 refills | Status: DC

## 2021-10-14 MED ORDER — CLOTRIMAZOLE-BETAMETHASONE 1-0.05 % EX CREA: 1.0000 "application " | TOPICAL_CREAM | Freq: Two times a day (BID) | CUTANEOUS | 0 refills | Status: DC

## 2021-10-19 ENCOUNTER — Telehealth: Payer: Self-pay | Admitting: Internal Medicine

## 2021-10-19 NOTE — Telephone Encounter (Signed)
LVM for pt to rtn my call to schedule AWV with [facility]. Please schedule if pt calls the office.  ?

## 2021-10-20 ENCOUNTER — Encounter: Payer: Self-pay | Admitting: Podiatry

## 2021-10-20 NOTE — Progress Notes (Signed)
°  Subjective:  Patient ID: Roberto Rivera, male    DOB: [birth year],  MRN: [id number]  Chief Complaint  Patient presents with   Nail Problem    Nail trim    71 y.o. male returns for the above complaint.  Patient presents with thickened elongated dystrophic toenails x10.  Patient is on blood thinner.  He would like to have the nails debrided down as he is not able to do it himself.  He states is mildly painful to touch.  He also has secondary complaint of athlete's foot.  Patient states there is some itching associated with it.  It sometimes hurts.  He would like to discuss treatment options for it.  He is tried some other medication none of that has helped Objective:  There were no vitals filed for this visit. Podiatric Exam: Vascular: dorsalis pedis and posterior tibial pulses are palpable bilateral. Capillary return is immediate. Temperature gradient is WNL. Skin turgor WNL  Sensorium: Normal Semmes Weinstein monofilament test. Normal tactile sensation bilaterally. Nail Exam: Pt has thick disfigured discolored nails with subungual debris noted bilateral entire nail hallux through fifth toenails.  Pain on palpation to the nails. Ulcer Exam: There is no evidence of ulcer or pre-ulcerative changes or infection. Orthopedic Exam: Muscle tone and strength are WNL. No limitations in general ROM. No crepitus or effusions noted. HAV  B/L.  Hammer toes 2-5  B/L. Skin: No Porokeratosis. No infection or ulcers.  Bilateral athlete's foot noted with subjective component of itching as well as epidermal lysis.    Assessment & Plan:   1. Tinea pedis of both feet   2. Pain due to onychomycosis of toenails of both feet   3. Coagulation defect [facility])       Patient was evaluated and treated and all questions answered.  Bilateral athlete's foot -I explained the patient the etiology of athlete's foot and various treatment options were extensively discussed.  I believe patient benefit from Lotrisone cream.  I  have asked her to apply twice a day.  He states understanding will do so.   Onychomycosis with pain  -Nails palliatively debrided as below. -Educated on self-care  Procedure: Nail Debridement Rationale: pain  Type of Debridement: manual, sharp debridement. Instrumentation: Nail nipper, rotary burr. Number of Nails: 10  Procedures and Treatment: Consent by patient was obtained for treatment procedures. The patient understood the discussion of treatment and procedures well. All questions were answered thoroughly reviewed. Debridement of mycotic and hypertrophic toenails, 1 through 5 bilateral and clearing of subungual debris. No ulceration, no infection noted.  Return Visit-Office Procedure: Patient instructed to return to the office for a follow up visit 3 months for continued evaluation and treatment.  Nicholes Rough, DPM    No follow-ups on file.

## 2021-10-28 ENCOUNTER — Telehealth: Payer: Self-pay | Admitting: Student

## 2021-11-09 ENCOUNTER — Ambulatory Visit: Payer: Medicare Other | Admitting: Internal Medicine

## 2021-11-16 ENCOUNTER — Ambulatory Visit (INDEPENDENT_AMBULATORY_CARE_PROVIDER_SITE_OTHER): Payer: Medicare Other | Admitting: Internal Medicine

## 2021-11-16 ENCOUNTER — Encounter: Payer: Self-pay | Admitting: Internal Medicine

## 2021-11-16 VITALS — BP 124/76 | HR 60 | Resp 18 | Ht 71.0 in | Wt 246.2 lb

## 2021-11-16 DIAGNOSIS — N1831 Chronic kidney disease, stage 3a: Secondary | ICD-10-CM

## 2021-11-16 DIAGNOSIS — R0600 Dyspnea, unspecified: Secondary | ICD-10-CM | POA: Diagnosis not present

## 2021-11-16 DIAGNOSIS — E785 Hyperlipidemia, unspecified: Secondary | ICD-10-CM

## 2021-11-16 DIAGNOSIS — E1121 Type 2 diabetes mellitus with diabetic nephropathy: Secondary | ICD-10-CM

## 2021-11-16 DIAGNOSIS — E559 Vitamin D deficiency, unspecified: Secondary | ICD-10-CM

## 2021-11-16 DIAGNOSIS — E1159 Type 2 diabetes mellitus with other circulatory complications: Secondary | ICD-10-CM

## 2021-11-16 DIAGNOSIS — I152 Hypertension secondary to endocrine disorders: Secondary | ICD-10-CM | POA: Diagnosis not present

## 2021-11-16 DIAGNOSIS — E1169 Type 2 diabetes mellitus with other specified complication: Secondary | ICD-10-CM

## 2021-11-16 DIAGNOSIS — Z0001 Encounter for general adult medical examination with abnormal findings: Secondary | ICD-10-CM

## 2021-11-16 LAB — HEPATIC FUNCTION PANEL
ALT: 11 U/L (ref 0–53)
AST: 16 U/L (ref 0–37)
Albumin: 4.2 g/dL (ref 3.5–5.2)
Alkaline Phosphatase: 81 U/L (ref 39–117)
Bilirubin, Direct: 0.2 mg/dL (ref 0.0–0.3)
Total Bilirubin: 0.5 mg/dL (ref 0.2–1.2)
Total Protein: 7.5 g/dL (ref 6.0–8.3)

## 2021-11-16 LAB — CBC WITH DIFFERENTIAL/PLATELET
Basophils Absolute: 0 10*3/uL (ref 0.0–0.1)
Basophils Relative: 0.4 % (ref 0.0–3.0)
Eosinophils Absolute: 0.1 10*3/uL (ref 0.0–0.7)
Eosinophils Relative: 1 % (ref 0.0–5.0)
HCT: 37.4 % — ABNORMAL LOW (ref 39.0–52.0)
Hemoglobin: 11.9 g/dL — ABNORMAL LOW (ref 13.0–17.0)
Lymphocytes Relative: 31.6 % (ref 12.0–46.0)
Lymphs Abs: 2.3 10*3/uL (ref 0.7–4.0)
MCHC: 31.9 g/dL (ref 30.0–36.0)
MCV: 73.5 fl — ABNORMAL LOW (ref 78.0–100.0)
Monocytes Absolute: 0.8 10*3/uL (ref 0.1–1.0)
Monocytes Relative: 10.9 % (ref 3.0–12.0)
Neutro Abs: 4 10*3/uL (ref 1.4–7.7)
Neutrophils Relative %: 56.1 % (ref 43.0–77.0)
Platelets: 210 10*3/uL (ref 150.0–400.0)
RBC: 5.08 Mil/uL (ref 4.22–5.81)
RDW: 17.8 % — ABNORMAL HIGH (ref 11.5–15.5)
WBC: 7.1 10*3/uL (ref 4.0–10.5)

## 2021-11-16 LAB — LIPID PANEL
Cholesterol: 108 mg/dL (ref 0–200)
HDL: 41.5 mg/dL (ref >39.00)
LDL Cholesterol: 55 mg/dL (ref 0–99)
NonHDL: 66.43
Total CHOL/HDL Ratio: 3
Triglycerides: 57 mg/dL (ref 0.0–149.0)
VLDL: 11.4 mg/dL (ref 0.0–40.0)

## 2021-11-16 LAB — BASIC METABOLIC PANEL
BUN: 29 mg/dL — ABNORMAL HIGH (ref 6–23)
CO2: 29 mEq/L (ref 19–32)
Calcium: 9.8 mg/dL (ref 8.4–10.5)
Chloride: 100 mEq/L (ref 96–112)
Creatinine, Ser: 1.74 mg/dL — ABNORMAL HIGH (ref 0.40–1.50)
GFR: 39.35 mL/min — ABNORMAL LOW (ref >60.00)
Glucose, Bld: 104 mg/dL — ABNORMAL HIGH (ref 70–99)
Potassium: 3.4 mEq/L — ABNORMAL LOW (ref 3.5–5.1)
Sodium: 137 mEq/L (ref 135–145)

## 2021-11-16 LAB — HEMOGLOBIN A1C: Hgb A1c MFr Bld: 5.9 % (ref 4.6–6.5)

## 2021-11-16 LAB — PSA: PSA: 2.41 ng/mL (ref 0.10–4.00)

## 2021-11-16 LAB — MICROALBUMIN / CREATININE URINE RATIO
Creatinine,U: 66.2 mg/dL
Microalb Creat Ratio: 5.8 mg/g (ref 0.0–30.0)
Microalb, Ur: 3.9 mg/dL — ABNORMAL HIGH (ref 0.0–1.9)

## 2021-11-16 LAB — VITAMIN D 25 HYDROXY (VIT D DEFICIENCY, FRACTURES): VITD: 18.34 ng/mL — ABNORMAL LOW (ref 30.00–100.00)

## 2021-11-16 LAB — TSH: TSH: 1.46 u[IU]/mL (ref 0.35–5.50)

## 2021-11-16 MED ORDER — CANDESARTAN CILEXETIL-HCTZ 32-25 MG PO TABS: 1.0000 | ORAL_TABLET | Freq: Every day | ORAL | 3 refills | Status: DC

## 2021-11-16 MED ORDER — ALBUTEROL SULFATE HFA 108 (90 BASE) MCG/ACT IN AERS: 2.0000 | INHALATION_SPRAY | Freq: Four times a day (QID) | RESPIRATORY_TRACT | 3 refills | Status: AC | PRN

## 2021-11-16 MED ORDER — METOPROLOL TARTRATE 50 MG PO TABS: ORAL_TABLET | ORAL | 3 refills | Status: DC

## 2021-11-16 MED ORDER — ROSUVASTATIN CALCIUM 5 MG PO TABS: 5.0000 mg | ORAL_TABLET | Freq: Every day | ORAL | 3 refills | Status: DC

## 2021-11-16 NOTE — Patient Instructions (Signed)
Please take all new medication as prescribed  - the albuterol inhaler for wheezing as needed ? ?Please continue all other medications as before, and refills have been done if requested. ? ?Please have the pharmacy call with any other refills you may need. ? ?Please continue your efforts at being more active, low cholesterol diet, and weight control. ? ?You are otherwise up to date with prevention measures today. ? ?Please keep your appointments with your specialists as you may have planned ? ?Please go to the LAB at the blood drawing area for the tests to be done ? ?You will be contacted by phone if any changes need to be made immediately.  Otherwise, you will receive a letter about your results with an explanation, but please check with MyChart first. ? ?Please remember to sign up for MyChart if you have not done so, as this will be important to you in the future with finding out test results, communicating by private email, and scheduling acute appointments online when needed. ? ?Please make an Appointment to return in 6 months, or sooner if needed ?

## 2021-11-16 NOTE — Assessment & Plan Note (Signed)
Lab Results  ?Component Value Date  ? LDLCALC 56 05/11/2021  ? ?Stable, pt to continue current statin crestor ? ?

## 2021-11-16 NOTE — Assessment & Plan Note (Signed)
Age and sex appropriate education and counseling updated with regular exercise and diet ?Referrals for preventative services - declines optho referral and colonoscopy ?Immunizations addressed - declines flu shot, covid booster, shingrix, tdap for now ?Smoking counseling  - none needed ?Evidence for depression or other mood disorder - none significant ?Most recent labs reviewed. ?I have personally reviewed and have noted: ?1) the patient's medical and social history ?2) The patient's current medications and supplements ?3) The patient's height, weight, and BMI have been recorded in the chart ? ?

## 2021-11-16 NOTE — Assessment & Plan Note (Signed)
Lab Results  ?Component Value Date  ? HGBA1C 6.0 05/11/2021  ? ?Stable, pt to continue current medical treatment prandin ? ?

## 2021-11-16 NOTE — Assessment & Plan Note (Signed)
Lab Results  Component Value Date   CREATININE 1.57 (H) 05/11/2021   Stable overall, cont to avoid nephrotoxins  

## 2021-11-16 NOTE — Assessment & Plan Note (Signed)
Exam benign, suspect asthma recent onset mild intermittent, declines cxr, for albuterol hfa prn ?

## 2021-11-16 NOTE — Progress Notes (Signed)
Patient ID: Roberto Rivera, male   DOB: 01/13/1951, 71 y.o.   MRN: [id number] ? ? ? ?     Chief Complaint:: wellness exam and 6 month f/u (No concerns. He wanted to make it known that when he breathes in he can hear a whistling sound. ) ? , HTN, DM, LOW VIT D, CKD, wheezing ? ?     HPI:  Roberto Rivera is a 71 y.o. male here for wellness exam; declines flu shot, covid booster, shingrix, tdap, also declines optho referral and colonoscopy for now, o/w up to date ?              Also not taking Vit d.  Pt denies chest pain, orthopnea, PND, increased LE swelling, palpitations, dizziness or syncope, but c/o mild intermittent sob/doe and wheezing in the past month. No hx of asthma prior.   Pt denies polydipsia, polyuria, or new focal neuro s/s.    Pt denies fever, wt loss, night sweats, loss of appetite, or other constitutional symptoms   ?  ?Wt Readings from Last 3 Encounters:  ?11/16/21 246 lb 3.2 oz (111.7 kg)  ?10/06/21 247 lb 3.2 oz (112.1 kg)  ?06/15/21 245 lb (111.1 kg)  ? ?BP Readings from Last 3 Encounters:  ?11/16/21 124/76  ?10/06/21 126/66  ?06/15/21 (!) 118/50  ? ?Immunization History  ?Administered Date(s) Administered  ? PFIZER(Purple Top)SARS-COV-2 Vaccination 04/23/2020, 05/14/2020  ? Pneumococcal Conjugate-13 12/25/2017  ? Pneumococcal Polysaccharide-23 02/04/2010, 01/28/2020  ? Td 01/29/2009  ? Zoster, Live 12/14/2011  ? ?Health Maintenance Due  ?Topic Date Due  ? HEMOGLOBIN A1C  11/08/2021  ? ?  ? ?Past Medical History:  ?Diagnosis Date  ? ALLERGIC RHINITIS 04/01/2007  ? Diabetes mellitus   ? DIABETES MELLITUS, TYPE II 04/01/2007  ? ERECTILE DYSFUNCTION, ORGANIC 02/07/2008  ? Hypercholesteremia   ? HYPERCHOLESTEROLEMIA 02/07/2008  ? Hypertension   ? HYPERTENSION 04/01/2007  ? MICROCYTOSIS 02/07/2008  ? OSTEOARTHRITIS, SPINE 02/07/2008  ? PAF (paroxysmal atrial fibrillation) ([place]) 07/11/2020  ? Stroke [facility])   ? THYROID CYST 02/07/2008  ? ?Past Surgical History:  ?Procedure Laterality Date  ? cyst removal  01/2021  ? back  ?  LEFT HEART CATH AND CORONARY ANGIOGRAPHY N/A 09/28/2020  ? Procedure: LEFT HEART CATH AND CORONARY ANGIOGRAPHY;  Surgeon: Nigel Mormon, MD;  Location: [facility];  Service: Cardiovascular;  Laterality: N/A;  ? ? reports that he quit smoking about 16 months ago. His smoking use included cigarettes. He smoked an average of 1 pack per day. He has never used smokeless tobacco. He reports that he does not drink alcohol and does not use drugs. ?family history includes Cancer in his mother; Stroke in his sister. ?No Known Allergies ?Current Outpatient Medications on File Prior to Visit  ?Medication Sig Dispense Refill  ? Accu-Chek FastClix Lancets MISC USE TO CHECK BLOOD SUGAR AS DIRECTED ONCE DAILY 102 each 2  ? Accu-Chek FastClix Lancets MISC USE TO CHECK BLOOD SUGAR AS DIRECTED ONCE DAILY 102 each 2  ? ammonium lactate (AMLACTIN) 12 % cream     ? apixaban (ELIQUIS) 5 MG TABS tablet Take 1 tablet (5 mg total) by mouth 2 (two) times daily. 180 tablet 3  ? clotrimazole-betamethasone (LOTRISONE) cream Apply 1 application topically 2 (two) times daily. 30 g 0  ? diltiazem (CARDIZEM CD) 180 MG 24 hr capsule Take 1 capsule (180 mg total) by mouth every evening. 90 capsule 3  ? glucose blood (ACCU-CHEK GUIDE) test strip 1 each by  Other route daily. And lancets 1/day 100 strip 3  ? hydrALAZINE (APRESOLINE) 100 MG tablet Take 1 tablet (100 mg total) by mouth 3 (three) times daily. 270 tablet 3  ? isosorbide dinitrate (ISORDIL) 20 MG tablet Take 1 tablet (20 mg total) by mouth 3 (three) times daily. 270 tablet 3  ? repaglinide (PRANDIN) 1 MG tablet Take 1 tablet (1 mg total) by mouth daily with supper. 90 tablet 3  ? nitroGLYCERIN (NITROSTAT) 0.4 MG SL tablet Place 1 tablet (0.4 mg total) under the tongue every 5 (five) minutes as needed for chest pain. 90 tablet 3  ? ?No current facility-administered medications on file prior to visit.  ? ?     ROS:  All others reviewed and negative. ? ?Objective  ? ?     PE:  BP  124/76   Pulse 60   Resp 18   Ht 5\' 11"  (1.803 m)   Wt 246 lb 3.2 oz (111.7 kg)   SpO2 96%   BMI 34.34 kg/m?  ? ?              Constitutional: Pt appears in NAD ?              HENT: Head: NCAT.  ?              Right Ear: External ear normal.   ?              Left Ear: External ear normal.  ?              Eyes: . Pupils are equal, round, and reactive to light. Conjunctivae and EOM are normal ?              Nose: without d/c or deformity ?              Neck: Neck supple. Gross normal ROM ?              Cardiovascular: Normal rate and regular rhythm.   ?              Pulmonary/Chest: Effort normal and breath sounds without rales or wheezing.  ?              Abd:  Soft, NT, ND, + BS, no organomegaly ?              Neurological: Pt is alert. At baseline orientation, motor grossly intact ?              Skin: Skin is warm. No rashes, no other new lesions, LE edema - none ?              Psychiatric: Pt behavior is normal without agitation  ? ?Micro: none ? ?Cardiac tracings I have personally interpreted today:  none ? ?Pertinent Radiological findings (summarize): none  ? ?Lab Results  ?Component Value Date  ? WBC 9.4 09/01/2020  ? HGB 15.0 09/01/2020  ? HCT 45.8 09/01/2020  ? PLT 216.0 09/01/2020  ? GLUCOSE 129 (H) 05/11/2021  ? CHOL 113 05/11/2021  ? TRIG 58.0 05/11/2021  ? HDL 45.60 05/11/2021  ? Maish [place] 56 05/11/2021  ? ALT 12 05/11/2021  ? AST 14 05/11/2021  ? NA 139 05/11/2021  ? K 3.2 (L) 05/11/2021  ? CL 103 05/11/2021  ? CREATININE 1.57 (H) 05/11/2021  ? BUN 32 (H) 05/11/2021  ? CO2 29 05/11/2021  ? TSH 1.66 09/01/2020  ? PSA 2.07 09/01/2020  ? INR 1.0 06/30/2020  ?  HGBA1C 6.0 05/11/2021  ? MICROALBUR 7.4 (H) 09/01/2020  ? ?Assessment/Plan:  ?Roberto Rivera is a 71 y.o. Black or African American [2] male with  has a past medical history of ALLERGIC RHINITIS (04/01/2007), Diabetes mellitus, DIABETES MELLITUS, TYPE II (04/01/2007), ERECTILE DYSFUNCTION, ORGANIC (02/07/2008), Hypercholesteremia, HYPERCHOLESTEROLEMIA  (02/07/2008), Hypertension, HYPERTENSION (04/01/2007), MICROCYTOSIS (02/07/2008), OSTEOARTHRITIS, SPINE (02/07/2008), PAF (paroxysmal atrial fibrillation) ([place]) (07/11/2020), Stroke ([place]), and THYROID CYST (02/07/2008). ? ?Vitamin D deficiency ?Last vitamin D ?Lab Results  ?Component Value Date  ? VD25OH 24.60 (L) 05/11/2021  ? ?Low, to start oral replacement ? ? ?Encounter for well adult exam with abnormal findings ?Age and sex appropriate education and counseling updated with regular exercise and diet ?Referrals for preventative services - declines optho referral and colonoscopy ?Immunizations addressed - declines flu shot, covid booster, shingrix, tdap for now ?Smoking counseling  - none needed ?Evidence for depression or other mood disorder - none significant ?Most recent labs reviewed. ?I have personally reviewed and have noted: ?1) the patient's medical and social history ?2) The patient's current medications and supplements ?3) The patient's height, weight, and BMI have been recorded in the chart ? ? ?Diabetes ([place]) ?Lab Results  ?Component Value Date  ? HGBA1C 6.0 05/11/2021  ? ?Stable, pt to continue current medical treatment prandin ? ? ?Hyperlipidemia associated with type 2 diabetes mellitus ([place]) ?Lab Results  ?Component Value Date  ? [place] 56 05/11/2021  ? ?Stable, pt to continue current statin crestor ? ? ?CKD (chronic kidney disease) stage 3, GFR 30-59 ml/min ([facility]) ?Lab Results  ?Component Value Date  ? CREATININE 1.57 (H) 05/11/2021  ? ?Stable overall, cont to avoid nephrotoxins ? ? ?Hypertension associated with diabetes ([place]) ?BP Readings from Last 3 Encounters:  ?11/16/21 124/76  ?10/06/21 126/66  ?06/15/21 (!) 118/50  ? ?Stable, pt to continue medical treatment cardizem, hydralzine, candesartan hct, lopressor ? ? ?Dyspnea ?Exam benign, suspect asthma recent onset mild intermittent, declines cxr, for albuterol hfa prn ? ?Followup: Return in about 6 months (around 05/19/2022). ? ?Cathlean Cower, MD  11/16/2021 9:09 PM ?[place] ?[place] ?Internal Medicine ?

## 2021-11-16 NOTE — Assessment & Plan Note (Signed)
Last vitamin D ?Lab Results  ?Component Value Date  ? VD25OH 24.60 (L) 05/11/2021  ? ?Low, to start oral replacement ? ?

## 2021-11-16 NOTE — Assessment & Plan Note (Signed)
BP Readings from Last 3 Encounters:  ?11/16/21 124/76  ?10/06/21 126/66  ?06/15/21 (!) 118/50  ? ?Stable, pt to continue medical treatment cardizem, hydralzine, candesartan hct, lopressor ? ?

## 2021-11-17 LAB — URINALYSIS, ROUTINE W REFLEX MICROSCOPIC
Bilirubin Urine: NEGATIVE
Hgb urine dipstick: NEGATIVE
Ketones, ur: NEGATIVE
Leukocytes,Ua: NEGATIVE
Nitrite: NEGATIVE
RBC / HPF: NONE SEEN (ref 0–?)
Specific Gravity, Urine: 1.015 (ref 1.000–1.030)
Total Protein, Urine: NEGATIVE
Urine Glucose: NEGATIVE
Urobilinogen, UA: 0.2 (ref 0.0–1.0)
pH: 6 (ref 5.0–8.0)

## 2021-11-17 NOTE — Telephone Encounter (Signed)
done

## 2021-11-20 ENCOUNTER — Encounter: Payer: Self-pay | Admitting: Internal Medicine

## 2021-11-20 ENCOUNTER — Other Ambulatory Visit: Payer: Self-pay | Admitting: Internal Medicine

## 2021-11-20 DIAGNOSIS — D649 Anemia, unspecified: Secondary | ICD-10-CM

## 2021-11-20 MED ORDER — POTASSIUM CHLORIDE ER 10 MEQ PO TBCR: 10.0000 meq | EXTENDED_RELEASE_TABLET | Freq: Every day | ORAL | 3 refills | Status: DC

## 2021-11-20 MED ORDER — CHOLECALCIFEROL 50 MCG (2000 UT) PO TABS: ORAL_TABLET | ORAL | 99 refills | Status: AC

## 2021-11-22 ENCOUNTER — Other Ambulatory Visit (INDEPENDENT_AMBULATORY_CARE_PROVIDER_SITE_OTHER): Payer: Medicare Other

## 2021-11-22 DIAGNOSIS — D649 Anemia, unspecified: Secondary | ICD-10-CM

## 2021-11-22 LAB — IBC PANEL
Iron: 53 ug/dL (ref 42–165)
Saturation Ratios: 14.2 % — ABNORMAL LOW (ref 20.0–50.0)
TIBC: 372.4 ug/dL (ref 250.0–450.0)
Transferrin: 266 mg/dL (ref 212.0–360.0)

## 2021-11-22 LAB — CBC WITH DIFFERENTIAL/PLATELET
Basophils Absolute: 0 10*3/uL (ref 0.0–0.1)
Basophils Relative: 0.5 % (ref 0.0–3.0)
Eosinophils Absolute: 0.1 10*3/uL (ref 0.0–0.7)
Eosinophils Relative: 1.5 % (ref 0.0–5.0)
HCT: 36.6 % — ABNORMAL LOW (ref 39.0–52.0)
Hemoglobin: 11.8 g/dL — ABNORMAL LOW (ref 13.0–17.0)
Lymphocytes Relative: 36.4 % (ref 12.0–46.0)
Lymphs Abs: 2.5 10*3/uL (ref 0.7–4.0)
MCHC: 32.1 g/dL (ref 30.0–36.0)
MCV: 73.3 fl — ABNORMAL LOW (ref 78.0–100.0)
Monocytes Absolute: 0.7 10*3/uL (ref 0.1–1.0)
Monocytes Relative: 10.6 % (ref 3.0–12.0)
Neutro Abs: 3.5 10*3/uL (ref 1.4–7.7)
Neutrophils Relative %: 51 % (ref 43.0–77.0)
Platelets: 206 10*3/uL (ref 150.0–400.0)
RBC: 5 Mil/uL (ref 4.22–5.81)
RDW: 18.1 % — ABNORMAL HIGH (ref 11.5–15.5)
WBC: 6.9 10*3/uL (ref 4.0–10.5)

## 2021-11-22 LAB — FERRITIN: Ferritin: 30.5 ng/mL (ref 22.0–322.0)

## 2021-11-23 ENCOUNTER — Encounter: Payer: Self-pay | Admitting: Internal Medicine

## 2021-11-29 ENCOUNTER — Telehealth: Payer: Self-pay

## 2021-11-29 NOTE — Telephone Encounter (Signed)
Pt is requesting refill on: ?diltiazem (CARDIZEM CD) 180 MG 24 hr capsule ? ?Pharmacy: ?[facility], [place] ? ?LOV 11/16/21 ?ROV 05/24/22 ?

## 2021-11-30 NOTE — Telephone Encounter (Signed)
Left message on patient's voicemail. Medication that is being requested for refill was prescribed by heart doctor. Advised patient to contact cardiology.  ?

## 2021-12-01 ENCOUNTER — Other Ambulatory Visit: Payer: Self-pay

## 2021-12-01 MED ORDER — DILTIAZEM HCL ER COATED BEADS 180 MG PO CP24: 180.0000 mg | ORAL_CAPSULE | Freq: Every evening | ORAL | 3 refills | Status: DC

## 2021-12-14 ENCOUNTER — Ambulatory Visit (INDEPENDENT_AMBULATORY_CARE_PROVIDER_SITE_OTHER): Payer: Medicare Other | Admitting: Endocrinology

## 2021-12-14 VITALS — BP 122/78 | HR 76 | Ht 71.0 in | Wt 250.6 lb

## 2021-12-14 DIAGNOSIS — E1121 Type 2 diabetes mellitus with diabetic nephropathy: Secondary | ICD-10-CM | POA: Diagnosis not present

## 2021-12-14 MED ORDER — REPAGLINIDE 1 MG PO TABS: 1.0000 mg | ORAL_TABLET | Freq: Every day | ORAL | 1 refills | Status: DC

## 2021-12-14 NOTE — Patient Instructions (Addendum)
Please continue the same repaglinide.   ?Blood tests are requested for you today.  We'll let you know about the results.  ?check your blood sugar once a day.  vary the time of day when you check, between before the 3 meals, and at bedtime.  also check if you have symptoms of your blood sugar being too high or too low.  please keep a record of the readings and bring it to your next appointment here (or you can bring the meter itself).  You can write it on any piece of paper.  please call us sooner if your blood sugar goes below 70, or if you have a lot of readings over 200.   ?You should have a follow-up appointment for your diabetes in 6 months.  ?

## 2021-12-14 NOTE — Progress Notes (Signed)
? ?Subjective:  ? ? Patient ID: Roberto Rivera, male    DOB: 1950/12/23, 71 y.o.   MRN: [id number] ? ?HPI ?Pt returns for f/u of diabetes mellitus:  ?DM type: 2 ?Dx'ed: 2015. ?Complications: CRI and mild CAD.   ?Therapy: repaglinide ?DKA: never ?Severe hypoglycemia: never.  ?Pancreatitis: never ?Pancreatic imaging: never.  ?SDOH: he declines name brand meds, and also bromocriptine, due to cost.   ?Other: he has never taken insulin; edema limits rx options. ?Interval history:  no cbg record, but states cbg's are in the low-100's.  He takes meds as rx'ed.    ?Past Medical History:  ?Diagnosis Date  ? ALLERGIC RHINITIS 04/01/2007  ? Diabetes mellitus   ? DIABETES MELLITUS, TYPE II 04/01/2007  ? ERECTILE DYSFUNCTION, ORGANIC 02/07/2008  ? Hypercholesteremia   ? HYPERCHOLESTEROLEMIA 02/07/2008  ? Hypertension   ? HYPERTENSION 04/01/2007  ? MICROCYTOSIS 02/07/2008  ? OSTEOARTHRITIS, SPINE 02/07/2008  ? PAF (paroxysmal atrial fibrillation) ([facility]) 07/11/2020  ? Stroke [facility])   ? THYROID CYST 02/07/2008  ? ? ?Past Surgical History:  ?Procedure Laterality Date  ? cyst removal  01/2021  ? back  ? LEFT HEART CATH AND CORONARY ANGIOGRAPHY N/A 09/28/2020  ? Procedure: LEFT HEART CATH AND CORONARY ANGIOGRAPHY;  Surgeon: Elder Negus, MD;  Location: [facility];  Service: Cardiovascular;  Laterality: N/A;  ? ? ?Social History  ? ?Socioeconomic History  ? Marital status: Divorced  ?  Spouse name: Not on file  ? Number of children: 2  ? Years of education: Not on file  ? Highest education level: Not on file  ?Occupational History  ? Occupation: Textile  ?  Employer: [employer]  ?Tobacco Use  ? Smoking status: Former  ?  Packs/day: 1.00  ?  Types: Cigarettes  ?  Quit date: 06/23/2020  ?  Years since quitting: 1.4  ? Smokeless tobacco: Never  ?Vaping Use  ? Vaping Use: Never used  ?Substance and Sexual Activity  ? Alcohol use: No  ?  Alcohol/week: 0.0 standard drinks  ? Drug use: No  ? Sexual activity: Not on file  ?Other Topics  Concern  ? Not on file  ?Social History Narrative  ? Divorced x many years  ? ?Social Determinants of Health  ? ?Financial Resource Strain: Not on file  ?Food Insecurity: Not on file  ?Transportation Needs: Not on file  ?Physical Activity: Not on file  ?Stress: Not on file  ?Social Connections: Not on file  ?Intimate Partner Violence: Not on file  ? ? ?Current Outpatient Medications on File Prior to Visit  ?Medication Sig Dispense Refill  ? Accu-Chek FastClix Lancets MISC USE TO CHECK BLOOD SUGAR AS DIRECTED ONCE DAILY 102 each 2  ? albuterol (VENTOLIN HFA) 108 (90 Base) MCG/ACT inhaler Inhale 2 puffs into the lungs every 6 (six) hours as needed for wheezing or shortness of breath. 24 g 3  ? ammonium lactate (AMLACTIN) 12 % cream     ? apixaban (ELIQUIS) 5 MG TABS tablet Take 1 tablet (5 mg total) by mouth 2 (two) times daily. 180 tablet 3  ? Candesartan Cilexetil-HCTZ 32-25 MG TABS Take 1 tablet by mouth daily. 90 tablet 3  ? Cholecalciferol 50 MCG (2000 UT) TABS 1 tab by mouth once daily 30 tablet 99  ? clotrimazole-betamethasone (LOTRISONE) cream Apply 1 application topically 2 (two) times daily. 30 g 0  ? diltiazem (CARDIZEM CD) 180 MG 24 hr capsule Take 1 capsule (180 mg total) by mouth every evening.  90 capsule 3  ? glucose blood (ACCU-CHEK GUIDE) test strip 1 each by Other route daily. And lancets 1/day 100 strip 3  ? hydrALAZINE (APRESOLINE) 100 MG tablet Take 1 tablet (100 mg total) by mouth 3 (three) times daily. 270 tablet 3  ? isosorbide dinitrate (ISORDIL) 20 MG tablet Take 1 tablet (20 mg total) by mouth 3 (three) times daily. 270 tablet 3  ? metoprolol tartrate (LOPRESSOR) 50 MG tablet TAKE 1 TABLET(50 MG) BY MOUTH TWICE DAILY 180 tablet 3  ? potassium chloride (KLOR-CON 10) 10 MEQ tablet Take 1 tablet (10 mEq total) by mouth daily. 90 tablet 3  ? rosuvastatin (CRESTOR) 5 MG tablet Take 1 tablet (5 mg total) by mouth at bedtime. 1 tab by mouth once daily 90 tablet 3  ? nitroGLYCERIN (NITROSTAT) 0.4 MG  SL tablet Place 1 tablet (0.4 mg total) under the tongue every 5 (five) minutes as needed for chest pain. 90 tablet 3  ? ?No current facility-administered medications on file prior to visit.  ? ? ?No Known Allergies ? ?Family History  ?Problem Relation Age of Onset  ? Cancer Mother   ?     had uncertain type of cancer  ? Stroke Sister   ? ? ?BP 122/78 (BP Location: Left Arm, Patient Position: Sitting, Cuff Size: Normal)   Pulse 76   Ht 5\' 11"  (1.803 m)   Wt 250 lb 9.6 oz (113.7 kg)   SpO2 97%   BMI 34.95 kg/m?  ? ? ?Review of Systems ?He denies hypoglycemia ?   ?Objective:  ? Physical Exam ?VITAL SIGNS:  See vs page ?GENERAL: no distress ? ? ?Lab Results  ?Component Value Date  ? HGBA1C 5.9 11/16/2021  ? ?Lab Results  ?Component Value Date  ? CREATININE 1.74 (H) 11/16/2021  ? BUN 29 (H) 11/16/2021  ? NA 137 11/16/2021  ? K 3.4 (L) 11/16/2021  ? CL 100 11/16/2021  ? CO2 29 11/16/2021  ? ?   ?Assessment & Plan:  ?Type 2 DM: overcontrolled.   ?CRI: check fructosamine. ? ?Patient Instructions  ?Please continue the same repaglinide.   ?Blood tests are requested for you today.  We'll let you know about the results.  ?check your blood sugar once a day.  vary the time of day when you check, between before the 3 meals, and at bedtime.  also check if you have symptoms of your blood sugar being too high or too low.  please keep a record of the readings and bring it to your next appointment here (or you can bring the meter itself).  You can write it on any piece of paper.  please call 11/18/2021 sooner if your blood sugar goes below 70, or if you have a lot of readings over 200.   ?You should have a follow-up appointment for your diabetes in 6 months.  ? ? ?

## 2022-01-11 ENCOUNTER — Telehealth: Payer: Medicare Other

## 2022-01-11 NOTE — Progress Notes (Unsigned)
Chronic Care Management Pharmacy Note  01/11/2022 Name:  Roberto Rivera MRN:  [id number] DOB:  17-Jan-1951  Summary: - Patient reports that he has been doing well as of late, since most recent blood pressure medications by cardiology, denies and issues - reports that he has been checking blood pressure in the morning before he takes his medication and has been averaging 150/80's -Denies any issues with bruising or bleeding with eliquis -Blood sugars well controlled in the 90's - no issues with hypoglycemia   Recommendations/Changes made from today's visit: -Recommending for patient to check blood pressure after taking AM medications -Continue to monitor blood sugars and reach out should BG become uncontrolled or should he have issues with hypoglycemia  Subjective: Roberto Rivera is an 71 y.o. year old male who is a primary patient of Roberto Rivera, Hunt Oris, MD.  The CCM team was consulted for assistance with disease management and care coordination needs.    Engaged with patient by telephone for follow up visit in response to provider referral for pharmacy case management and/or care coordination services.   Consent to Services:  The patient was given the following information about Chronic Care Management services today, agreed to services, and gave verbal consent: 1. CCM service includes personalized support from designated clinical staff supervised by the primary care provider, including individualized plan of care and coordination with other care providers 2. 24/7 contact phone numbers for assistance for urgent and routine care needs. 3. Service will only be billed when office clinical staff spend 20 minutes or more in a month to coordinate care. 4. Only one practitioner may furnish and bill the service in a calendar month. 5.The patient may stop CCM services at any time (effective at the end of the month) by phone call to the office staff. 6. The patient will be responsible for cost sharing (co-pay) of up to  20% of the service fee (after annual deductible is met). Patient agreed to services and consent obtained.  Patient Care Team: [name], MD as PCP - General (Internal Medicine) Rosemary Holms, [place] (Podiatry) Delice Bison Darnelle Maffucci, [facility] as Pharmacist (Pharmacist)  Recent office visits:  11/16/2021 - Dr. Jenny Rivera - no changes to medications - f/u in 6 months    Recent consult visits:  12/14/2021 - Dr. Loanne Drilling - Endocrinology -no changes to medications - f/u in 6 months  10/14/2021 - Dr. Posey Pronto - Podiatry - clotrimazole-betamethasone cream rx'd -athletes's  10/06/2021 - Celeste Cantwell PA-C - Cardiology - switch diltiazem to nighttime - f/u in 6 months  08/25/2021 - Dr. Maudie Mercury - VV - COVID positive - finish paxlovid rx   Hospital visits:  02/22/21 - Seen at Urgent Care for infected cyst of right upper back - drained cyst - started doxycycline and APAP - to follow up with dermatology for further evaluation   Objective:  Lab Results  Component Value Date   CREATININE 1.74 (H) 11/16/2021   BUN 29 (H) 11/16/2021   GFR 39.35 (L) 11/16/2021   GFRNONAA 49 (L) 09/28/2020   GFRAA 74 05/31/2007   NA 137 11/16/2021   K 3.4 (L) 11/16/2021   CALCIUM 9.8 11/16/2021   CO2 29 11/16/2021   GLUCOSE 104 (H) 11/16/2021    Lab Results  Component Value Date/Time   HGBA1C 5.9 11/16/2021 03:42 PM   HGBA1C 6.0 05/11/2021 09:23 AM   GFR 39.35 (L) 11/16/2021 03:42 PM   GFR 44.67 (L) 05/11/2021 09:23 AM   MICROALBUR 3.9 (H) 11/16/2021 03:42 PM   MICROALBUR 7.4 (  H) 09/01/2020 10:06 AM    Last diabetic Eye exam:  Lab Results  Component Value Date/Time   HMDIABEYEEXA No Retinopathy 12/17/2019 12:00 AM    Last diabetic Foot exam:  No results found for: HMDIABFOOTEX   Lab Results  Component Value Date   CHOL 108 11/16/2021   HDL 41.50 11/16/2021   LDLCALC 55 11/16/2021   TRIG 57.0 11/16/2021   CHOLHDL 3 11/16/2021       Latest Ref Rng & Units 11/16/2021    3:42 PM 05/11/2021    9:23 AM 09/01/2020   10:06  AM  Hepatic Function  Total Protein 6.0 - 8.3 g/dL 7.5   7.5   7.2    Albumin 3.5 - 5.2 g/dL 4.2   4.0   4.4    AST 0 - 37 U/L 16   14   14     ALT 0 - 53 U/L 11   12   12     Alk Phosphatase 39 - 117 U/L 81   82   92    Total Bilirubin 0.2 - 1.2 mg/dL 0.5   0.5   0.6    Bilirubin, Direct 0.0 - 0.3 mg/dL 0.2   0.1   0.2      Lab Results  Component Value Date/Time   TSH 1.46 11/16/2021 03:42 PM   TSH 1.66 09/01/2020 10:06 AM       Latest Ref Rng & Units 11/22/2021   10:48 AM 11/16/2021    3:42 PM 09/01/2020   10:06 AM  CBC  WBC 4.0 - 10.5 K/uL 6.9   7.1   9.4    Hemoglobin 13.0 - 17.0 g/dL 11.8   11.9   15.0    Hematocrit 39.0 - 52.0 % 36.6   37.4   45.8    Platelets 150.0 - 400.0 K/uL 206.0   210.0   216.0      Lab Results  Component Value Date/Time   VD25OH 18.34 (L) 11/16/2021 03:42 PM   VD25OH 24.60 (L) 05/11/2021 09:23 AM    Clinical ASCVD: Yes  The ASCVD Risk score (Arnett DK, et al., 2019) failed to calculate for the following reasons:   The patient has a prior MI or stroke diagnosis       11/16/2021    3:22 PM 05/11/2021    8:40 AM 11/03/2020    9:25 AM  Depression screen PHQ 2/9  Decreased Interest 0 0 0  Down, Depressed, Hopeless 1 0 0  PHQ - 2 Score 1 0 0     Social History   Tobacco Use  Smoking Status Former   Packs/day: 1.00   Types: Cigarettes   Quit date: 06/23/2020   Years since quitting: 1.5  Smokeless Tobacco Never   BP Readings from Last 3 Encounters:  12/14/21 122/78  11/16/21 124/76  10/06/21 126/66   Pulse Readings from Last 3 Encounters:  12/14/21 76  11/16/21 60  10/06/21 (!) 57   Wt Readings from Last 3 Encounters:  12/14/21 250 lb 9.6 oz (113.7 kg)  11/16/21 246 lb 3.2 oz (111.7 kg)  10/06/21 247 lb 3.2 oz (112.1 kg)   BMI Readings from Last 3 Encounters:  12/14/21 34.95 kg/m  11/16/21 34.34 kg/m  10/06/21 34.48 kg/m    Assessment/Interventions: Review of patient past medical history, allergies, medications, health  status, including review of consultants reports, laboratory and other test data, was performed as part of comprehensive evaluation and provision of chronic care management services.   SDOH:  (  Social Determinants of Health) assessments and interventions performed: Yes  SDOH Screenings   Alcohol Screen: Not on file  Depression (PHQ2-9): Low Risk    PHQ-2 Score: 1  Financial Resource Strain: Not on file  Food Insecurity: Not on file  Housing: Not on file  Physical Activity: Not on file  Social Connections: Not on file  Stress: Not on file  Tobacco Use: Medium Risk   Smoking Tobacco Use: Former   Smokeless Tobacco Use: Never   Passive Exposure: Not on file  Transportation Needs: Not on file    [place]  No Known Allergies  Medications Reviewed Today     Reviewed by Sarina Ill, CMA (Certified Medical Assistant) on 12/14/21 at 0753  Med List Status: <None>   Medication Order Taking? Sig Documenting Provider Last Dose Status Informant  Accu-Chek FastClix Lancets MISC [id number] Yes USE TO CHECK BLOOD SUGAR AS DIRECTED ONCE DAILY Renato Shin, MD Taking Active   albuterol (VENTOLIN HFA) 108 (90 Base) MCG/ACT inhaler [id number] Yes Inhale 2 puffs into the lungs every 6 (six) hours as needed for wheezing or shortness of breath. [name], MD Taking Active   ammonium lactate (AMLACTIN) 12 % cream [id number] Yes  [provider] Taking Active   apixaban (ELIQUIS) 5 MG TABS tablet [id number] Yes Take 1 tablet (5 mg total) by mouth 2 (two) times daily. Cantwell, Celeste C, PA-C Taking Active   Candesartan Cilexetil-HCTZ 32-25 MG TABS [id number] Yes Take 1 tablet by mouth daily. [name], MD Taking Active   Cholecalciferol 50 MCG [phone] UT) TABS [id number] Yes 1 tab by mouth once daily [name], MD Taking Active   clotrimazole-betamethasone Donalynn Furlong) cream [id number] Yes Apply 1 application topically 2 (two) times daily. Felipa Furnace, DPM Taking Active    diltiazem (CARDIZEM CD) 180 MG 24 hr capsule [id number] Yes Take 1 capsule (180 mg total) by mouth every evening. Cantwell, Celeste C, PA-C Taking Active   glucose blood (ACCU-CHEK GUIDE) test strip [id number] Yes 1 each by Other route daily. And lancets 1/day Renato Shin, MD Taking Active   hydrALAZINE (APRESOLINE) 100 MG tablet [id number] Yes Take 1 tablet (100 mg total) by mouth 3 (three) times daily. Cantwell, Celeste C, PA-C Taking Active   isosorbide dinitrate (ISORDIL) 20 MG tablet [id number] Yes Take 1 tablet (20 mg total) by mouth 3 (three) times daily. Cantwell, Celeste C, PA-C Taking Active   metoprolol tartrate (LOPRESSOR) 50 MG tablet [id number] Yes TAKE 1 TABLET(50 MG) BY MOUTH TWICE DAILY [name], MD Taking Active   nitroGLYCERIN (NITROSTAT) 0.4 MG SL tablet [id number]  Place 1 tablet (0.4 mg total) under the tongue every 5 (five) minutes as needed for chest pain. Alethia Berthold, PA-C  Expired 05/25/21 2359            Med Note Dorthey Sawyer   Tue Sep 28, 2020 10:23 AM) Never taken   potassium chloride (KLOR-CON 10) 10 MEQ tablet [id number] Yes Take 1 tablet (10 mEq total) by mouth daily. [name], MD Taking Active   repaglinide (PRANDIN) 1 MG tablet [id number] Yes Take 1 tablet (1 mg total) by mouth daily with supper. Renato Shin, MD Taking Active   rosuvastatin (CRESTOR) 5 MG tablet [id number] Yes Take 1 tablet (5 mg total) by mouth at bedtime. 1 tab by mouth once daily [name], MD Taking Active             Patient Active Problem List  Diagnosis Date Noted   Dyspnea 11/16/2021   Vitamin D deficiency 11/08/2020   Chronic anticoagulation 11/08/2020   Abnormal stress test 09/05/2020   Nail disorder 09/01/2020   Paroxysmal A-fib ([place]) 07/11/2020   History of stroke 07/06/2020   Acute CVA (cerebrovascular accident) ([place]) 06/30/2020   Hyperlipidemia associated with type 2 diabetes mellitus ([place]) 06/30/2020   Acute kidney injury superimposed on chronic  kidney disease ([place]) 06/30/2020   Low back pain 08/26/2019   CKD (chronic kidney disease) stage 3, GFR 30-59 ml/min ([place]) 02/05/2019   Left groin pain 02/05/2019   Hypokalemia 06/26/2017   Back pain 05/24/2017   Abnormal ECG 04/25/2017   Screening examination for infectious disease 03/10/2016   Skin nodule 02/26/2015   Encounter for well adult exam with abnormal findings 04/26/2013   Rash and nonspecific skin eruption 04/26/2013   Left knee pain 04/11/2013   Diabetes ([place]) 10/13/2011   Screening for prostate cancer 10/13/2011   Encounter for long-term (current) use of other medications 10/13/2011   Tobacco use 02/04/2010   Edema 02/04/2010   PROTEINURIA, MILD 01/29/2009   THYROID CYST 02/07/2008   ERECTILE DYSFUNCTION, ORGANIC 02/07/2008   OSTEOARTHRITIS, SPINE 02/07/2008   MICROCYTOSIS 02/07/2008   Hypertension associated with diabetes ([place]) 04/01/2007   Allergic rhinitis 04/01/2007    Immunization History  Administered Date(s) Administered   PFIZER(Purple Top)SARS-COV-2 Vaccination 04/23/2020, 05/14/2020   Pneumococcal Conjugate-13 12/25/2017   Pneumococcal Polysaccharide-23 02/04/2010, 01/28/2020   Td 01/29/2009   Zoster, Live 12/14/2011    Conditions to be addressed/monitored:  Hypertension, Hyperlipidemia, Diabetes, Atrial Fibrillation, Coronary Artery Disease, and Chronic Kidney Disease  There are no care plans that you recently modified to display for this patient.      Medication Assistance: None required.  Patient affirms current coverage meets needs.  Patient's preferred pharmacy is:  [facility] [place], [place] AT [facility] [place] [address] Phone: [phone] Fax: [phone]    Uses pill box? Yes Pt endorses 100% compliance  Care Plan and Follow Up Patient Decision:  Patient agrees to Care Plan and Follow-up.  Plan: Telephone follow up appointment with care management team member  scheduled for:  4 months and The patient has been provided with contact information for the care management team and has been advised to call with any health related questions or concerns.   [name], PharmD Clinical Pharmacist, [place]

## 2022-02-27 ENCOUNTER — Ambulatory Visit: Payer: Medicare Other

## 2022-03-01 ENCOUNTER — Telehealth: Payer: Medicare Other

## 2022-03-06 ENCOUNTER — Ambulatory Visit: Payer: Medicare Other

## 2022-03-06 VITALS — Ht 71.0 in | Wt 250.0 lb

## 2022-03-06 NOTE — Patient Instructions (Addendum)
[name] , Thank you for taking time to come for your Medicare Wellness Visit. I appreciate your ongoing commitment to your health goals. Please review the following plan we discussed and let me know if I can assist you in the future.   These are the goals we discussed:  Goals       Monitor and Manage My Blood Sugar-Diabetes Type 2      Timeframe:  Long-Range Goal Priority:  High Start Date:  05/19/2021                          Expected End Date: 11/16/2021                      Follow Up Date 09/18/2021   - check blood sugar at prescribed times - check blood sugar before and after exercise - check blood sugar if I feel it is too high or too low - enter blood sugar readings and medication or insulin into daily log - take the blood sugar log to all doctor visits    Why is this important?   Checking your blood sugar at home helps to keep it from getting very high or very low.  Writing the results in a diary or log helps the doctor know how to care for you.  Your blood sugar log should have the time, date and the results.  Also, write down the amount of insulin or other medicine that you take.  Other information, like what you ate, exercise done and how you were feeling, will also be helpful.        No current goals (pt-stated)      Track and Manage Blood pressure, Heart Rate, and Rhythm-Atrial Fibrillation/ HTN      Timeframe:  Long-Range Goal Priority:  High Start Date:  05/19/2021                          Expected End Date:  11/16/2021                     Follow Up Date 09/18/2021  -check blood pressure once a day  - check pulse (heart) rate before taking medicine - check pulse (heart) rate once a day - make a plan to exercise regularly - make a plan to eat healthy - keep all lab appointments - take medicine as prescribed    Why is this important?   Atrial fibrillation may have no symptoms. Sometimes the symptoms get worse or happen more often.  It is important to keep track  of what your symptoms are and when they happen.  A change in symptoms is important to discuss with your doctor or nurse.  Being active and healthy eating will also help you manage your heart condition.          This is a list of the screening recommended for you and due dates:  Health Maintenance  Topic Date Due   Zoster (Shingles) Vaccine (1 of 2) Never done   COVID-19 Vaccine (3 - Pfizer risk series) 06/11/2020   Eye exam for diabetics  12/16/2020   Complete foot exam   02/09/2022   Tetanus Vaccine  11/17/2022*   Colon Cancer Screening  07/03/2024*   Flu Shot  03/21/2022   Hemoglobin A1C  05/19/2022   Yearly kidney function blood test for diabetes  11/17/2022   Yearly kidney health urinalysis  for diabetes  11/17/2022   Pneumonia Vaccine  Completed   Hepatitis C Screening: USPSTF Recommendation to screen - Ages 70-79 yo.  Completed   HPV Vaccine  Aged Out  *Topic was postponed. The date shown is not the original due date.   Advanced directives: No  Conditions/risks identified: None  Next appointment: Follow up in one year for your annual wellness visit.    Preventive Care 70 Years and Older, Male Preventive care refers to lifestyle choices and visits with your health care provider that can promote health and wellness. What does preventive care include? A yearly physical exam. This is also called an annual well check. Dental exams once or twice a year. Routine eye exams. Ask your health care provider how often you should have your eyes checked. Personal lifestyle choices, including: Daily care of your teeth and gums. Regular physical activity. Eating a healthy diet. Avoiding tobacco and drug use. Limiting alcohol use. Practicing safe sex. Taking low doses of aspirin every day. Taking vitamin and mineral supplements as recommended by your health care provider. What happens during an annual well check? The services and screenings done by your health care provider during  your annual well check will depend on your age, overall health, lifestyle risk factors, and family history of disease. Counseling  Your health care provider may ask you questions about your: Alcohol use. Tobacco use. Drug use. Emotional well-being. Home and relationship well-being. Sexual activity. Eating habits. History of falls. Memory and ability to understand (cognition). Work and work Astronomer. Screening  You may have the following tests or measurements: Height, weight, and BMI. Blood pressure. Lipid and cholesterol levels. These may be checked every 5 years, or more frequently if you are over 41 years old. Skin check. Lung cancer screening. You may have this screening every year starting at age 24 if you have a 30-pack-year history of smoking and currently smoke or have quit within the past 15 years. Fecal occult blood test (FOBT) of the stool. You may have this test every year starting at age 31. Flexible sigmoidoscopy or colonoscopy. You may have a sigmoidoscopy every 5 years or a colonoscopy every 10 years starting at age 6. Prostate cancer screening. Recommendations will vary depending on your family history and other risks. Hepatitis C blood test. Hepatitis B blood test. Sexually transmitted disease (STD) testing. Diabetes screening. This is done by checking your blood sugar (glucose) after you have not eaten for a while (fasting). You may have this done every 1-3 years. Abdominal aortic aneurysm (AAA) screening. You may need this if you are a current or former smoker. Osteoporosis. You may be screened starting at age 12 if you are at high risk. Talk with your health care provider about your test results, treatment options, and if necessary, the need for more tests. Vaccines  Your health care provider may recommend certain vaccines, such as: Influenza vaccine. This is recommended every year. Tetanus, diphtheria, and acellular pertussis (Tdap, Td) vaccine. You may need  a Td booster every 10 years. Zoster vaccine. You may need this after age 32. Pneumococcal 13-valent conjugate (PCV13) vaccine. One dose is recommended after age 93. Pneumococcal polysaccharide (PPSV23) vaccine. One dose is recommended after age 40. Talk to your health care provider about which screenings and vaccines you need and how often you need them. This information is not intended to replace advice given to you by your health care provider. Make sure you discuss any questions you have with your health care provider.  Document Released: 09/03/2015 Document Revised: 04/26/2016 Document Reviewed: 06/08/2015 Elsevier Interactive Patient Education  2017 [employer].  Fall Prevention in the Home Falls can cause injuries. They can happen to people of all ages. There are many things you can do to make your home safe and to help prevent falls. What can I do on the outside of my home? Regularly fix the edges of walkways and driveways and fix any cracks. Remove anything that might make you trip as you walk through a door, such as a raised step or threshold. Trim any bushes or trees on the path to your home. Use bright outdoor lighting. Clear any walking paths of anything that might make someone trip, such as rocks or tools. Regularly check to see if handrails are loose or broken. Make sure that both sides of any steps have handrails. Any raised decks and porches should have guardrails on the edges. Have any leaves, snow, or ice cleared regularly. Use sand or salt on walking paths during winter. Clean up any spills in your garage right away. This includes oil or grease spills. What can I do in the bathroom? Use night lights. Install grab bars by the toilet and in the tub and shower. Do not use towel bars as grab bars. Use non-skid mats or decals in the tub or shower. If you need to sit down in the shower, use a plastic, non-slip stool. Keep the floor dry. Clean up any water that spills on the  floor as soon as it happens. Remove soap buildup in the tub or shower regularly. Attach bath mats securely with double-sided non-slip rug tape. Do not have throw rugs and other things on the floor that can make you trip. What can I do in the bedroom? Use night lights. Make sure that you have a light by your bed that is easy to reach. Do not use any sheets or blankets that are too big for your bed. They should not hang down onto the floor. Have a firm chair that has side arms. You can use this for support while you get dressed. Do not have throw rugs and other things on the floor that can make you trip. What can I do in the kitchen? Clean up any spills right away. Avoid walking on wet floors. Keep items that you use a lot in easy-to-reach places. If you need to reach something above you, use a strong step stool that has a grab bar. Keep electrical cords out of the way. Do not use floor polish or wax that makes floors slippery. If you must use wax, use non-skid floor wax. Do not have throw rugs and other things on the floor that can make you trip. What can I do with my stairs? Do not leave any items on the stairs. Make sure that there are handrails on both sides of the stairs and use them. Fix handrails that are broken or loose. Make sure that handrails are as long as the stairways. Check any carpeting to make sure that it is firmly attached to the stairs. Fix any carpet that is loose or worn. Avoid having throw rugs at the top or bottom of the stairs. If you do have throw rugs, attach them to the floor with carpet tape. Make sure that you have a light switch at the top of the stairs and the bottom of the stairs. If you do not have them, ask someone to add them for you. What else can I do to help  prevent falls? Wear shoes that: Do not have high heels. Have rubber bottoms. Are comfortable and fit you well. Are closed at the toe. Do not wear sandals. If you use a stepladder: Make sure that  it is fully opened. Do not climb a closed stepladder. Make sure that both sides of the stepladder are locked into place. Ask someone to hold it for you, if possible. Clearly mark and make sure that you can see: Any grab bars or handrails. First and last steps. Where the edge of each step is. Use tools that help you move around (mobility aids) if they are needed. These include: Canes. Walkers. Scooters. Crutches. Turn on the lights when you go into a dark area. Replace any light bulbs as soon as they burn out. Set up your furniture so you have a clear path. Avoid moving your furniture around. If any of your floors are uneven, fix them. If there are any pets around you, be aware of where they are. Review your medicines with your doctor. Some medicines can make you feel dizzy. This can increase your chance of falling. Ask your doctor what other things that you can do to help prevent falls. This information is not intended to replace advice given to you by your health care provider. Make sure you discuss any questions you have with your health care provider. Document Released: 06/03/2009 Document Revised: 01/13/2016 Document Reviewed: 09/11/2014 Elsevier Interactive Patient Education  2017 [employer].

## 2022-03-06 NOTE — Progress Notes (Signed)
Subjective:   Roberto ClevelandRoy Strauser is a 71 y.o. male who presents for Medicare Annual/Subsequent preventive examination.  Review of Systems    Virtual Visit via Telephone Note  I connected with  Roberto Rivera on 03/06/22 at  3:00 PM EDT by telephone and verified that I am speaking with the correct person using two identifiers.  Location: Patient: Home Provider: Office Persons participating in the virtual visit: patient/Nurse Health Advisor   I discussed the limitations, risks, security and privacy concerns of performing an evaluation and management service by telephone and the availability of in person appointments. The patient expressed understanding and agreed to proceed.  Interactive audio and video telecommunications were attempted between this nurse and patient, however failed, due to patient having technical difficulties OR patient did not have access to video capability.  We continued and completed visit with audio only.  Some vital signs may be absent or patient reported.   Tillie RungBeverly W Jelissa Espiritu, LPN  Cardiac Risk Factors include: advanced age (>1155men, 30>65 women);diabetes mellitus;male gender;hypertension     Objective:    Today's Vitals   03/06/22 1501  Weight: 250 lb (113.4 kg)  Height: 5\' 11"  (1.803 m)   Body mass index is 34.87 kg/m.     03/06/2022    3:13 PM 11/03/2020    9:26 AM 09/28/2020   10:27 AM 07/19/2020    3:05 PM 07/01/2020    2:51 AM 12/25/2017    3:33 PM 03/10/2016    3:01 PM  Advanced Directives  Does Patient Have a Medical Advance Directive? No No No No No No No  Would patient like information on creating a medical advance directive? No - Patient declined No - Patient declined No - Patient declined No - Patient declined No - Patient declined      Current Medications (verified) Outpatient Encounter Medications as of 03/06/2022  Medication Sig   Accu-Chek FastClix Lancets MISC USE TO CHECK BLOOD SUGAR AS DIRECTED ONCE DAILY   albuterol (VENTOLIN HFA) 108 (90 Base)  MCG/ACT inhaler Inhale 2 puffs into the lungs every 6 (six) hours as needed for wheezing or shortness of breath.   ammonium lactate (AMLACTIN) 12 % cream    apixaban (ELIQUIS) 5 MG TABS tablet Take 1 tablet (5 mg total) by mouth 2 (two) times daily.   Candesartan Cilexetil-HCTZ 32-25 MG TABS Take 1 tablet by mouth daily.   Cholecalciferol 50 MCG (2000 UT) TABS 1 tab by mouth once daily   clotrimazole-betamethasone (LOTRISONE) cream Apply 1 application topically 2 (two) times daily.   diltiazem (CARDIZEM CD) 180 MG 24 hr capsule Take 1 capsule (180 mg total) by mouth every evening.   glucose blood (ACCU-CHEK GUIDE) test strip 1 each by Other route daily. And lancets 1/day   hydrALAZINE (APRESOLINE) 100 MG tablet Take 1 tablet (100 mg total) by mouth 3 (three) times daily.   isosorbide dinitrate (ISORDIL) 20 MG tablet Take 1 tablet (20 mg total) by mouth 3 (three) times daily.   metoprolol tartrate (LOPRESSOR) 50 MG tablet TAKE 1 TABLET(50 MG) BY MOUTH TWICE DAILY   nitroGLYCERIN (NITROSTAT) 0.4 MG SL tablet Place 1 tablet (0.4 mg total) under the tongue every 5 (five) minutes as needed for chest pain.   potassium chloride (KLOR-CON 10) 10 MEQ tablet Take 1 tablet (10 mEq total) by mouth daily.   repaglinide (PRANDIN) 1 MG tablet Take 1 tablet (1 mg total) by mouth daily with supper.   rosuvastatin (CRESTOR) 5 MG tablet Take 1 tablet (5 mg  total) by mouth at bedtime. 1 tab by mouth once daily   No facility-administered encounter medications on file as of 03/06/2022.    Allergies (verified) Patient has no known allergies.   History: Past Medical History:  Diagnosis Date   ALLERGIC RHINITIS 04/01/2007   Diabetes mellitus    DIABETES MELLITUS, TYPE II 04/01/2007   ERECTILE DYSFUNCTION, ORGANIC 02/07/2008   Hypercholesteremia    HYPERCHOLESTEROLEMIA 02/07/2008   Hypertension    HYPERTENSION 04/01/2007   MICROCYTOSIS 02/07/2008   OSTEOARTHRITIS, SPINE 02/07/2008   PAF (paroxysmal atrial  fibrillation) (HCC) 07/11/2020   Stroke (HCC)    THYROID CYST 02/07/2008   Past Surgical History:  Procedure Laterality Date   cyst removal  01/2021   back   LEFT HEART CATH AND CORONARY ANGIOGRAPHY N/A 09/28/2020   Procedure: LEFT HEART CATH AND CORONARY ANGIOGRAPHY;  Surgeon: Elder Negus, MD;  Location: [facility];  Service: Cardiovascular;  Laterality: N/A;   Family History  Problem Relation Age of Onset   Cancer Mother        had uncertain type of cancer   Stroke Sister    Social History   Socioeconomic History   Marital status: Divorced    Spouse name: Not on file   Number of children: 2   Years of education: Not on file   Highest education level: Not on file  Occupational History   Occupation: Theatre stage manager: [employer]  Tobacco Use   Smoking status: Former    Packs/day: 1.00    Types: Cigarettes    Quit date: 06/23/2020    Years since quitting: 1.7   Smokeless tobacco: Never  Vaping Use   Vaping Use: Never used  Substance and Sexual Activity   Alcohol use: No    Alcohol/week: 0.0 standard drinks of alcohol   Drug use: No   Sexual activity: Not on file  Other Topics Concern   Not on file  Social History Narrative   Divorced x many years   Social Determinants of Health   Financial Resource Strain: Low Risk  (03/06/2022)   Overall Financial Resource Strain (CARDIA)    Difficulty of Paying Living Expenses: Not hard at all  Food Insecurity: No Food Insecurity (03/06/2022)   Hunger Vital Sign    Worried About Running Out of Food in the Last Year: Never true    Ran Out of Food in the Last Year: Never true  Transportation Needs: No Transportation Needs (03/06/2022)   PRAPARE - Administrator, Civil Service (Medical): No    Lack of Transportation (Non-Medical): No  Physical Activity: Inactive (03/06/2022)   Exercise Vital Sign    Days of Exercise per Week: 0 days    Minutes of Exercise per Session: 0 min  Stress: No Stress  Concern Present (03/06/2022)   [employer] of Occupational Health - Occupational Stress Questionnaire    Feeling of Stress : Not at all  Social Connections: Socially Isolated (03/06/2022)   Social Connection and Isolation Panel [NHANES]    Frequency of Communication with Friends and Family: More than three times a week    Frequency of Social Gatherings with Friends and Family: More than three times a week    Attends Religious Services: Never    Database administrator or Organizations: No    Attends Banker Meetings: Never    Marital Status: Divorced    Tobacco Counseling Counseling given: Not Answered   Clinical Intake: Nutrition Risk  Assessment:  Has the patient had any N/V/D within the last 2 months?  No  Does the patient have any non-healing wounds?  No  Has the patient had any unintentional weight loss or weight gain?  No   Diabetes:  Is the patient diabetic?  Yes  If diabetic, was a CBG obtained today?  Yes CBG 112 Did the patient bring in their glucometer from home?  No  How often do you monitor your CBG's? Daily.   Financial Strains and Diabetes Management:  Are you having any financial strains with the device, your supplies or your medication? No .  Does the patient want to be seen by Chronic Care Management for management of their diabetes?  No  Would the patient like to be referred to a Nutritionist or for Diabetic Management?  No   Diabetic Exams:  Diabetic Eye Exam: Completed No. Overdue for diabetic eye exam. Pt has been advised about the importance in completing this exam. A referral has been placed today. Message sent to referral coordinator for scheduling purposes. Advised pt to expect a call from office referred to regarding appt.  Diabetic Foot Exam: Completed No. Pt has been advised about the importance in completing this exam. Pt is scheduled for diabetic foot exam on Followed by PCP.   How often do you need to have someone help you when  you read instructions, pamphlets, or other written materials from your doctor or pharmacy?: 1 - Never  Diabetic?  Yes  Interpreter Needed?: No Activities of Daily Living    03/06/2022    3:11 PM  In your present state of health, do you have any difficulty performing the following activities:  Hearing? 0  Vision? 0  Difficulty concentrating or making decisions? 0  Walking or climbing stairs? 0  Dressing or bathing? 0  Doing errands, shopping? 0  Preparing Food and eating ? N  Using the Toilet? N  In the past six months, have you accidently leaked urine? N  Do you have problems with loss of bowel control? N  Managing your Medications? N  Managing your Finances? N  Housekeeping or managing your Housekeeping? N    Patient Care Team: Corwin Levins, MD as PCP - General (Internal Medicine) Larey Dresser, DPM (Podiatry) [place], Vinnie Level, [facility] (Inactive) as Pharmacist (Pharmacist)  Indicate any recent Medical Services you may have received from other than [facility] providers in the past year (date may be approximate).     Assessment:   This is a routine wellness examination for [place].  Hearing/Vision screen Hearing Screening - Comments:: No hearing difficulty Vision Screening - Comments:: Wears glasses. Followed by [facility]  Dietary issues and exercise activities discussed: Exercise limited by: None identified   Goals Addressed               This Visit's Progress     No current goals (pt-stated)         Depression Screen    03/06/2022    3:06 PM 11/16/2021    3:22 PM 05/11/2021    8:40 AM 11/03/2020    9:25 AM 09/01/2020    9:56 AM 08/11/2020   11:39 AM 01/28/2020    9:21 AM  PHQ 2/9 Scores  PHQ - 2 Score 0 1 0 0 0 0 0    Fall Risk    03/06/2022    3:13 PM 11/16/2021    3:21 PM 05/11/2021    8:40 AM 11/03/2020    9:26 AM 09/01/2020  9:56 AM  Fall Risk   Falls in the past year? 0 0 0 0 0  Number falls in past yr: 0 0 0 0   Injury with Fall? 0 0 0 0   Risk  for fall due to : No Fall Risks   No Fall Risks   Follow up    Falls evaluation completed     FALL RISK PREVENTION PERTAINING TO THE HOME:  Any stairs in or around the home?  If so, are there any without handrails?  Home free of loose throw rugs in walkways, pet beds, electrical cords, etc?  Adequate lighting in your home to reduce risk of falls?   ASSISTIVE DEVICES UTILIZED TO PREVENT FALLS:  Life alert?  Use of a cane, walker or w/c?  Grab bars in the bathroom?  Shower chair or bench in shower?  Elevated toilet seat or a handicapped toilet?   TIMED UP AND GO:  Was the test performed? No . Audio Visit  Cognitive Function:      03/06/2022    3:13 PM  6CIT Screen  What Year? 0 points  What month? 0 points    Immunizations Immunization History  Administered Date(s) Administered   PFIZER(Purple Top)SARS-COV-2 Vaccination 04/23/2020, 05/14/2020   Pneumococcal Conjugate-13 12/25/2017   Pneumococcal Polysaccharide-23 02/04/2010, 01/28/2020   Td 01/29/2009   Zoster, Live 12/14/2011            Qualifies for Shingles Vaccine?  Zostavax completed    Screening Tests Health Maintenance  Topic Date Due   Zoster Vaccines- Shingrix (1 of 2) Never done   COVID-19 Vaccine (3 - Pfizer risk series) 06/11/2020   OPHTHALMOLOGY EXAM  12/16/2020   FOOT EXAM  02/09/2022   TETANUS/TDAP  11/17/2022 (Originally 01/30/2019)   COLONOSCOPY (Pts 45-19yrs Insurance coverage will need to be confirmed)  07/03/2024 (Originally 10/14/1996)   INFLUENZA VACCINE  03/21/2022   HEMOGLOBIN A1C  05/19/2022   Diabetic kidney evaluation - GFR measurement  11/17/2022   Diabetic kidney evaluation - Urine ACR  11/17/2022   Pneumonia Vaccine 61+ Years old  Completed   Hepatitis C Screening  Completed   HPV VACCINES  Aged Out    Health Maintenance  Health Maintenance Due  Topic Date Due   Zoster Vaccines- Shingrix (1 of 2) Never done   COVID-19 Vaccine (3 - Pfizer risk series) 06/11/2020    OPHTHALMOLOGY EXAM  12/16/2020   FOOT EXAM  02/09/2022      Lung Cancer Screening: (Low Dose CT Chest recommended if Age 78-80 years, 30 pack-year currently smoking OR have quit w/in 15years.)  qualify.   Lung Cancer Screening Referral:   Additional Screening:  Hepatitis C Screening:  qualify; Completed   Vision Screening: Recommended annual ophthalmology exams for early detection of glaucoma and other disorders of the eye. Is the patient up to date with their annual eye exam?   Who is the provider or what is the name of the office in which the patient attends annual eye exams?  If pt is not established with a provider, would they like to be referred to a provider to establish care? .   Dental Screening: Recommended annual dental exams for proper oral hygiene  Community Resource Referral / Chronic Care Management:  CRR required this visit?  No   CCM required this visit?  No      Plan:     I have personally reviewed and noted the following in the patient's chart:   Medical and social  history Use of alcohol, tobacco or illicit drugs  Current medications and supplements including opioid prescriptions. Patient is not currently taking opioid prescriptions. Functional ability and status Nutritional status Physical activity Advanced directives List of other physicians Hospitalizations, surgeries, and ER visits in previous 12 months Vitals Screenings to include cognitive, depression, and falls Referrals and appointments  In addition, I have reviewed and discussed with patient certain preventive protocols, quality metrics, and best practice recommendations. A written personalized care plan for preventive services as well as general preventive health recommendations were provided to patient.     Tillie Rung, LPN   [id number]   Nurse Notes: Patient refused to complete this encounter, disconnected from visit.   This encounter was created in error - please disregard.

## 2022-03-15 ENCOUNTER — Ambulatory Visit: Payer: Medicare Other | Admitting: Student

## 2022-03-24 ENCOUNTER — Other Ambulatory Visit: Payer: Medicare Other

## 2022-03-29 ENCOUNTER — Ambulatory Visit: Payer: Medicare Other | Admitting: Cardiology

## 2022-03-30 ENCOUNTER — Other Ambulatory Visit: Payer: Medicare Other

## 2022-04-05 ENCOUNTER — Ambulatory Visit: Payer: Medicare Other | Admitting: Student

## 2022-04-14 ENCOUNTER — Ambulatory Visit (INDEPENDENT_AMBULATORY_CARE_PROVIDER_SITE_OTHER): Payer: Medicare Other | Admitting: Podiatry

## 2022-04-14 DIAGNOSIS — M79675 Pain in left toe(s): Secondary | ICD-10-CM | POA: Diagnosis not present

## 2022-04-14 DIAGNOSIS — B351 Tinea unguium: Secondary | ICD-10-CM

## 2022-04-14 DIAGNOSIS — M79674 Pain in right toe(s): Secondary | ICD-10-CM | POA: Diagnosis not present

## 2022-04-14 DIAGNOSIS — D689 Coagulation defect, unspecified: Secondary | ICD-10-CM | POA: Diagnosis not present

## 2022-04-14 NOTE — Progress Notes (Signed)
  Subjective:  Patient ID: Roberto Rivera, male    DOB: 06/22/1951,  MRN: [id number]  Chief Complaint  Patient presents with   Nail Problem    Nail trim    71 y.o. male returns for the above complaint.  Patient presents with thickened elongated dystrophic toenails x10.  Patient is on blood thinner.  He would like to have the nails debrided down as he is not able to do it himself.  He states is mildly painful to touch.   Objective:  There were no vitals filed for this visit. Podiatric Exam: Vascular: dorsalis pedis and posterior tibial pulses are palpable bilateral. Capillary return is immediate. Temperature gradient is WNL. Skin turgor WNL  Sensorium: Normal Semmes Weinstein monofilament test. Normal tactile sensation bilaterally. Nail Exam: Pt has thick disfigured discolored nails with subungual debris noted bilateral entire nail hallux through fifth toenails.  Pain on palpation to the nails. Ulcer Exam: There is no evidence of ulcer or pre-ulcerative changes or infection. Orthopedic Exam: Muscle tone and strength are WNL. No limitations in general ROM. No crepitus or effusions noted. HAV  B/L.  Hammer toes 2-5  B/L. Skin: No Porokeratosis. No infection or ulcers.  Bilateral athlete's foot noted with subjective component of itching as well as epidermal lysis.    Assessment & Plan:   1. Pain due to onychomycosis of toenails of both feet   2. Coagulation defect [facility])        Patient was evaluated and treated and all questions answered.  Bilateral athlete's foot -I explained the patient the etiology of athlete's foot and various treatment options were extensively discussed.  I believe patient benefit from Lotrisone cream.  I have asked her to apply twice a day.  He states understanding will do so.   Onychomycosis with pain  -Nails palliatively debrided as below. -Educated on self-care  Procedure: Nail Debridement Rationale: pain  Type of Debridement: manual, sharp  debridement. Instrumentation: Nail nipper, rotary burr. Number of Nails: 10  Procedures and Treatment: Consent by patient was obtained for treatment procedures. The patient understood the discussion of treatment and procedures well. All questions were answered thoroughly reviewed. Debridement of mycotic and hypertrophic toenails, 1 through 5 bilateral and clearing of subungual debris. No ulceration, no infection noted.  Return Visit-Office Procedure: Patient instructed to return to the office for a follow up visit 3 months for continued evaluation and treatment.  Nicholes Rough, DPM    No follow-ups on file.

## 2022-05-01 ENCOUNTER — Telehealth: Payer: Self-pay | Admitting: Internal Medicine

## 2022-05-01 NOTE — Telephone Encounter (Signed)
LVM for pt to rtn my call to schedule AWV with [facility], call back # [phone] 

## 2022-05-10 ENCOUNTER — Encounter: Payer: Self-pay | Admitting: Cardiology

## 2022-05-10 ENCOUNTER — Ambulatory Visit: Payer: Medicare Other | Admitting: Cardiology

## 2022-05-10 VITALS — BP 152/79 | HR 59 | Temp 97.8°F | Resp 16 | Ht 71.0 in | Wt 245.0 lb

## 2022-05-10 DIAGNOSIS — I2510 Atherosclerotic heart disease of native coronary artery without angina pectoris: Secondary | ICD-10-CM | POA: Insufficient documentation

## 2022-05-10 DIAGNOSIS — I480 Paroxysmal atrial fibrillation: Secondary | ICD-10-CM | POA: Diagnosis not present

## 2022-05-10 DIAGNOSIS — I10 Essential (primary) hypertension: Secondary | ICD-10-CM | POA: Diagnosis not present

## 2022-05-10 DIAGNOSIS — R0989 Other specified symptoms and signs involving the circulatory and respiratory systems: Secondary | ICD-10-CM | POA: Insufficient documentation

## 2022-05-10 MED ORDER — LABETALOL HCL 200 MG PO TABS: 200.0000 mg | ORAL_TABLET | Freq: Two times a day (BID) | ORAL | 3 refills | Status: DC

## 2022-05-10 NOTE — Progress Notes (Signed)
Follow up visit  Subjective:   Roberto Rivera, male    DOB: 25-Dec-1950, 71 y.o.   MRN: [id number]     HPI  Chief Complaint  Patient presents with   Atrial Fibrillation    71 y/o African American melr with hypertension, hyperlipidemia, CAD, PAF, h/o stroke  Patient denies chest pain, shortness of breath, palpitations, leg edema, orthopnea, PND, TIA/syncope.  Blood pressure is elevated today.  Reviewed home blood pressure readings.  Blood pressure has been ranging around 150 mmHg.  On a separate note, his dad noticed that his right eye was red.  Patient denies any itching, watering etc.    Current Outpatient Medications:    Accu-Chek FastClix Lancets MISC, USE TO CHECK BLOOD SUGAR AS DIRECTED ONCE DAILY, Disp: 102 each, Rfl: 2   albuterol (VENTOLIN HFA) 108 (90 Base) MCG/ACT inhaler, Inhale 2 puffs into the lungs every 6 (six) hours as needed for wheezing or shortness of breath., Disp: 24 g, Rfl: 3   ammonium lactate (AMLACTIN) 12 % cream, , Disp: , Rfl:    apixaban (ELIQUIS) 5 MG TABS tablet, Take 1 tablet (5 mg total) by mouth 2 (two) times daily., Disp: 180 tablet, Rfl: 3   Candesartan Cilexetil-HCTZ 32-25 MG TABS, Take 1 tablet by mouth daily., Disp: 90 tablet, Rfl: 3   Cholecalciferol 50 MCG (2000 UT) TABS, 1 tab by mouth once daily, Disp: 30 tablet, Rfl: 99   clotrimazole-betamethasone (LOTRISONE) cream, Apply 1 application topically 2 (two) times daily., Disp: 30 g, Rfl: 0   diltiazem (CARDIZEM CD) 180 MG 24 hr capsule, Take 1 capsule (180 mg total) by mouth every evening., Disp: 90 capsule, Rfl: 3   glucose blood (ACCU-CHEK GUIDE) test strip, 1 each by Other route daily. And lancets 1/day, Disp: 100 strip, Rfl: 3   hydrALAZINE (APRESOLINE) 100 MG tablet, Take 1 tablet (100 mg total) by mouth 3 (three) times daily., Disp: 270 tablet, Rfl: 3   isosorbide dinitrate (ISORDIL) 20 MG tablet, Take 1 tablet (20 mg total) by mouth 3 (three) times daily., Disp: 270 tablet, Rfl: 3    metoprolol tartrate (LOPRESSOR) 50 MG tablet, TAKE 1 TABLET(50 MG) BY MOUTH TWICE DAILY, Disp: 180 tablet, Rfl: 3   nitroGLYCERIN (NITROSTAT) 0.4 MG SL tablet, Place 1 tablet (0.4 mg total) under the tongue every 5 (five) minutes as needed for chest pain., Disp: 90 tablet, Rfl: 3   potassium chloride (KLOR-CON 10) 10 MEQ tablet, Take 1 tablet (10 mEq total) by mouth daily., Disp: 90 tablet, Rfl: 3   repaglinide (PRANDIN) 1 MG tablet, Take 1 tablet (1 mg total) by mouth daily with supper., Disp: 90 tablet, Rfl: 1   rosuvastatin (CRESTOR) 5 MG tablet, Take 1 tablet (5 mg total) by mouth at bedtime. 1 tab by mouth once daily, Disp: 90 tablet, Rfl: 3   Cardiovascular & other pertient studies:  Reviewed external labs and tests, independently interpreted  EKG 05/10/2022: Sinus rhythm 57 bpm Normal EKG   Left heart cath and coronary angiography 09/28/2020:  LM: Large, normal LAD: Large, minimal luminal irregularities Ramus: Large, normal LCx: Large, normal RCA:  Prox calcific 50% stenosis, distal RCA subtotal occlusion with TIMI 1 flow  PCV MYOCARDIAL PERFUSION WO LEXISCAN 08/04/2020 Nondiagnostic ECG stress due to pharmacologic stress. Resting EKG demonstrated atrial fibrillation with rapid ventricular response. Non-specific ST-T abnormality. Peak EKG/ demonstrated atrial fibrillation with rapid ventricular response. There is a reversible moderate defect in the lateral and inferior regions. consistent with severe ischemia. Overall LV  systolic function is abnormal with regional wall motion abnormalities in the same area.  Stress LV EF: 34%. No previous exam available for comparison. High risk study.   Venous Bilateral Ultrasound 07/02/2020: RIGHT:  - There is no evidence of deep vein thrombosis in the lower extremity.  - No cystic structure found in the popliteal fossa.  LEFT:  - There is no evidence of deep vein thrombosis in the lower extremity.  - No cystic structure found in the popliteal  fossa.   ECHOCARDIOGRAM COMPLETE 07/01/2020 1. Left ventricular ejection fraction, by estimation, is 60 to 65%. The left ventricle has normal function. The left ventricle has no regional wall motion abnormalities. There is mild left ventricular hypertrophy. Left ventricular diastolic parameters are consistent with Grade I diastolic dysfunction (impaired relaxation).   2. Right ventricular systolic function is normal. The right ventricular size is normal. Tricuspid regurgitation signal is inadequate for assessing PA pressure.   3. Left atrial size was severely dilated.   4. The mitral valve is normal in structure. No evidence of mitral valve regurgitation. No evidence of mitral stenosis. There was chordal systolic anterior motion noted (not valvular).  5. The aortic valve is tricuspid. Aortic valve regurgitation is mild. Mild aortic valve stenosis. Aortic valve mean gradient measures 14.0 mmHg.   6. Aortic dilatation noted. There is mild dilatation of the ascending aorta, measuring 40 mm.  7. The inferior vena cava is normal in size with greater than 50% respiratory variability, suggesting right atrial pressure of 3 mmHg.   VAS US CAROTID 07/01/2020 Right Carotid: Velocities in the right ICA are consistent with a 1-39% stenosis.  Left Carotid: Velocities in the left ICA are consistent with a 1-39% stenosis.  Vertebrals:  Bilateral vertebral arteries demonstrate antegrade flow.  Subclavians: Normal flow hemodynamics were seen in bilateral subclavian arteries.   Recent labs: 11/16/2021: Glucose 104, BUN/Cr 29/1.74. EGFR 39. Na/K 137/3.4. Rest of the CMP normal H/H 11/36. MCV 3. Platelets 206 HbA1C 5.9% Chol 108, TG 57, HDL 41, LDL 55 TSH 1.4 normal    Review of Systems  Cardiovascular:  Negative for chest pain, dyspnea on exertion, leg swelling, palpitations and syncope.         Vitals:   05/10/22 1105  BP: (!) 152/79  Pulse: (!) 59  Resp: 16  Temp: 97.8 F (36.6 C)  SpO2: 96%      Body mass index is 34.17 kg/m. Filed Weights   05/10/22 1105  Weight: 245 lb (111.1 kg)     Objective:   Physical Exam Vitals and nursing note reviewed.  Constitutional:      General: He is not in acute distress. Eyes:     Comments: Right eye subconjunctival hemorrhage  Neck:     Vascular: No JVD.  Cardiovascular:     Rate and Rhythm: Normal rate and regular rhythm.     Heart sounds: Normal heart sounds. No murmur heard. Pulmonary:     Effort: Pulmonary effort is normal.     Breath sounds: Normal breath sounds. No wheezing or rales.             Visit diagnoses:   ICD-10-CM   1. Coronary artery disease involving native coronary artery of native heart without angina pectoris  I25.10 EKG 12-Lead    CANCELED: EKG 12-Lead    2. PAF (paroxysmal atrial fibrillation) (HCC)  I48.0 CANCELED: EKG 12-Lead    3. Essential hypertension  I10        Orders Placed This Encounter  Procedures   EKG 12-Lead     Medication changes this visit: Medications Discontinued During This Encounter  Medication Reason   metoprolol tartrate (LOPRESSOR) 50 MG tablet Change in therapy    Meds ordered this encounter  Medications   labetalol (NORMODYNE) 200 MG tablet    Sig: Take 1 tablet (200 mg total) by mouth 2 (two) times daily.    Dispense:  60 tablet    Refill:  3     Assessment & Recommendations:    72 y/o African American melr with hypertension, hyperlipidemia, CAD, PAF, h/o stroke  Hypertension: Uncontrolled.  Especially in the setting of subconjunctival hemorrhage, recommend better blood pressure control. Change metoprolol tartrate 50 mg twice daily to labetalol 200 mg twice daily. Continue rest of the antihypertensive therapy. Follow-up with the ophthalmologist and PCP.  PAF: Currently in sinus rhythm. CHA2DS2-VASc score 5, 7.2%.  Continue Eliquis twice daily.     Nigel Mormon, MD Pager: [phone] Office: [phone]

## 2022-05-11 ENCOUNTER — Encounter: Payer: Self-pay | Admitting: Internal Medicine

## 2022-05-11 ENCOUNTER — Other Ambulatory Visit (INDEPENDENT_AMBULATORY_CARE_PROVIDER_SITE_OTHER): Payer: Medicare Other

## 2022-05-11 ENCOUNTER — Ambulatory Visit (INDEPENDENT_AMBULATORY_CARE_PROVIDER_SITE_OTHER): Payer: Medicare Other | Admitting: Internal Medicine

## 2022-05-11 VITALS — BP 132/74 | HR 57 | Temp 98.0°F | Ht 71.0 in | Wt 242.0 lb

## 2022-05-11 DIAGNOSIS — I10 Essential (primary) hypertension: Secondary | ICD-10-CM | POA: Diagnosis not present

## 2022-05-11 DIAGNOSIS — E559 Vitamin D deficiency, unspecified: Secondary | ICD-10-CM | POA: Diagnosis not present

## 2022-05-11 DIAGNOSIS — E1169 Type 2 diabetes mellitus with other specified complication: Secondary | ICD-10-CM

## 2022-05-11 DIAGNOSIS — E1121 Type 2 diabetes mellitus with diabetic nephropathy: Secondary | ICD-10-CM

## 2022-05-11 DIAGNOSIS — E785 Hyperlipidemia, unspecified: Secondary | ICD-10-CM

## 2022-05-11 DIAGNOSIS — H16141 Punctate keratitis, right eye: Secondary | ICD-10-CM | POA: Diagnosis not present

## 2022-05-11 LAB — HEPATIC FUNCTION PANEL
ALT: 10 U/L (ref 0–53)
AST: 15 U/L (ref 0–37)
Albumin: 3.9 g/dL (ref 3.5–5.2)
Alkaline Phosphatase: 80 U/L (ref 39–117)
Bilirubin, Direct: 0.2 mg/dL (ref 0.0–0.3)
Total Bilirubin: 0.6 mg/dL (ref 0.2–1.2)
Total Protein: 7.7 g/dL (ref 6.0–8.3)

## 2022-05-11 LAB — LIPID PANEL
Cholesterol: 109 mg/dL (ref 0–200)
HDL: 41.2 mg/dL (ref >39.00)
LDL Cholesterol: 56 mg/dL (ref 0–99)
NonHDL: 68.17
Total CHOL/HDL Ratio: 3
Triglycerides: 59 mg/dL (ref 0.0–149.0)
VLDL: 11.8 mg/dL (ref 0.0–40.0)

## 2022-05-11 LAB — BASIC METABOLIC PANEL
BUN: 24 mg/dL — ABNORMAL HIGH (ref 6–23)
CO2: 30 mEq/L (ref 19–32)
Calcium: 9.9 mg/dL (ref 8.4–10.5)
Chloride: 100 mEq/L (ref 96–112)
Creatinine, Ser: 1.79 mg/dL — ABNORMAL HIGH (ref 0.40–1.50)
GFR: 37.9 mL/min — ABNORMAL LOW (ref >60.00)
Glucose, Bld: 125 mg/dL — ABNORMAL HIGH (ref 70–99)
Potassium: 3.5 mEq/L (ref 3.5–5.1)
Sodium: 137 mEq/L (ref 135–145)

## 2022-05-11 LAB — VITAMIN D 25 HYDROXY (VIT D DEFICIENCY, FRACTURES): VITD: 55.02 ng/mL (ref 30.00–100.00)

## 2022-05-11 MED ORDER — ACCU-CHEK FASTCLIX LANCETS MISC: 3 refills | Status: DC

## 2022-05-11 NOTE — Progress Notes (Signed)
Patient ID: Roberto Rivera, male   DOB: 02-11-1951, 70 y.o.   MRN: [id number]        Chief Complaint: follow up HTN, HLD and DM       HPI:  Roberto Rivera is a 71 y.o. male here after saw cardiology yesterday with higher BP, started labetoll 200 mg bid.  Not yet started but BP better today.  Also with large redness to lateral right eye conjunctiva, no vision change.  Plans to see optho at noon today.  Pt denies chest pain, increased sob or doe, wheezing, orthopnea, PND, increased LE swelling, palpitations, dizziness or syncope.   Pt denies polydipsia, polyuria, or new focal neuro s/s.    Pt denies fever, wt loss, night sweats, loss of appetite, or other constitutional symptoms  Declines flu shot,and all vax's, as well as colonoscopy for now.  BPs docuemented at home are more often > 140/90.  CBGs in low 100s   not taking VIt D       Wt Readings from Last 3 Encounters:  05/11/22 242 lb (109.8 kg)  05/10/22 245 lb (111.1 kg)  03/06/22 250 lb (113.4 kg)   BP Readings from Last 3 Encounters:  05/11/22 132/74  05/10/22 (!) 152/79  12/14/21 122/78         Past Medical History:  Diagnosis Date   ALLERGIC RHINITIS 04/01/2007   Diabetes mellitus    DIABETES MELLITUS, TYPE II 04/01/2007   ERECTILE DYSFUNCTION, ORGANIC 02/07/2008   Hypercholesteremia    HYPERCHOLESTEROLEMIA 02/07/2008   Hypertension    HYPERTENSION 04/01/2007   MICROCYTOSIS 02/07/2008   OSTEOARTHRITIS, SPINE 02/07/2008   PAF (paroxysmal atrial fibrillation) ([place]) 07/11/2020   Stroke ([place])    THYROID CYST 02/07/2008   Past Surgical History:  Procedure Laterality Date   cyst removal  01/2021   back   LEFT HEART CATH AND CORONARY ANGIOGRAPHY N/A 09/28/2020   Procedure: LEFT HEART CATH AND CORONARY ANGIOGRAPHY;  Surgeon: Nigel Mormon, MD;  Location: [facility];  Service: Cardiovascular;  Laterality: N/A;    reports that he quit smoking about 22 months ago. His smoking use included cigarettes. He smoked an average of 1 pack per  day. He has never used smokeless tobacco. He reports that he does not drink alcohol and does not use drugs. family history includes Cancer in his mother; Stroke in his sister. No Known Allergies Current Outpatient Medications on File Prior to Visit  Medication Sig Dispense Refill   albuterol (VENTOLIN HFA) 108 (90 Base) MCG/ACT inhaler Inhale 2 puffs into the lungs every 6 (six) hours as needed for wheezing or shortness of breath. 24 g 3   ammonium lactate (AMLACTIN) 12 % cream      apixaban (ELIQUIS) 5 MG TABS tablet Take 1 tablet (5 mg total) by mouth 2 (two) times daily. 180 tablet 3   Candesartan Cilexetil-HCTZ 32-25 MG TABS Take 1 tablet by mouth daily. 90 tablet 3   Cholecalciferol 50 MCG (2000 UT) TABS 1 tab by mouth once daily 30 tablet 99   clotrimazole-betamethasone (LOTRISONE) cream Apply 1 application topically 2 (two) times daily. 30 g 0   diltiazem (CARDIZEM CD) 180 MG 24 hr capsule Take 1 capsule (180 mg total) by mouth every evening. 90 capsule 3   glucose blood (ACCU-CHEK GUIDE) test strip 1 each by Other route daily. And lancets 1/day 100 strip 3   hydrALAZINE (APRESOLINE) 100 MG tablet Take 1 tablet (100 mg total) by mouth 3 (three) times daily. [place]  tablet 3   isosorbide dinitrate (ISORDIL) 20 MG tablet Take 1 tablet (20 mg total) by mouth 3 (three) times daily. 270 tablet 3   labetalol (NORMODYNE) 200 MG tablet Take 1 tablet (200 mg total) by mouth 2 (two) times daily. 60 tablet 3   potassium chloride (KLOR-CON 10) 10 MEQ tablet Take 1 tablet (10 mEq total) by mouth daily. 90 tablet 3   repaglinide (PRANDIN) 1 MG tablet Take 1 tablet (1 mg total) by mouth daily with supper. 90 tablet 1   rosuvastatin (CRESTOR) 5 MG tablet Take 1 tablet (5 mg total) by mouth at bedtime. 1 tab by mouth once daily 90 tablet 3   nitroGLYCERIN (NITROSTAT) 0.4 MG SL tablet Place 1 tablet (0.4 mg total) under the tongue every 5 (five) minutes as needed for chest pain. 90 tablet 3   No current  facility-administered medications on file prior to visit.        ROS:  All others reviewed and negative.  Objective        PE:  BP 132/74 (BP Location: Left Arm, Patient Position: Sitting, Cuff Size: Large)   Pulse (!) 57   Temp 98 F (36.7 C) (Oral)   Ht 5\' 11"  (1.803 m)   Wt 242 lb (109.8 kg)   SpO2 95%   BMI 33.75 kg/m                 Constitutional: Pt appears in NAD               HENT: Head: NCAT.                Right Ear: External ear normal.                 Left Ear: External ear normal.                Eyes: . Pupils are equal, round, and reactive to light. Conjunctivae and EOM are normal               Nose: without d/c or deformity               Neck: Neck supple. Gross normal ROM               Cardiovascular: Normal rate and irreg irregular rhythm.                 Pulmonary/Chest: Effort normal and breath sounds without rales or wheezing.                Abd:  Soft, NT, ND, + BS, no organomegaly               Neurological: Pt is alert. At baseline orientation, motor grossly intact               Skin: Skin is warm. No rashes, no other new lesions, LE edema - none               Psychiatric: Pt behavior is normal without agitation   Micro: none  Cardiac tracings I have personally interpreted today:  none  Pertinent Radiological findings (summarize): none   Lab Results  Component Value Date   WBC 6.9 11/22/2021   HGB 11.8 (L) 11/22/2021   HCT 36.6 (L) 11/22/2021   PLT 206.0 11/22/2021   GLUCOSE 104 (H) 11/16/2021   CHOL 108 11/16/2021   TRIG 57.0 11/16/2021   HDL 41.50 11/16/2021   LDLCALC 55 11/16/2021   ALT  11 11/16/2021   AST 16 11/16/2021   NA 137 11/16/2021   K 3.4 (L) 11/16/2021   CL 100 11/16/2021   CREATININE 1.74 (H) 11/16/2021   BUN 29 (H) 11/16/2021   CO2 29 11/16/2021   TSH 1.46 11/16/2021   PSA 2.41 11/16/2021   INR 1.0 06/30/2020   HGBA1C 5.9 11/16/2021   MICROALBUR 3.9 (H) 11/16/2021   Assessment/Plan:  Roberto Rivera is a 71 y.o. Black or  African American [2] male with  has a past medical history of ALLERGIC RHINITIS (04/01/2007), Diabetes mellitus, DIABETES MELLITUS, TYPE II (04/01/2007), ERECTILE DYSFUNCTION, ORGANIC (02/07/2008), Hypercholesteremia, HYPERCHOLESTEROLEMIA (02/07/2008), Hypertension, HYPERTENSION (04/01/2007), MICROCYTOSIS (02/07/2008), OSTEOARTHRITIS, SPINE (02/07/2008), PAF (paroxysmal atrial fibrillation) ([place]) (07/11/2020), Stroke ([place]), and THYROID CYST (02/07/2008).  Diabetes ([place]) Lab Results  Component Value Date   HGBA1C 5.9 11/16/2021   Stable, pt to continue current medical treatment prandin 1 mg with supper   Essential hypertension BP Readings from Last 3 Encounters:  05/11/22 132/74  05/10/22 (!) 152/79  12/14/21 122/78   Stable, pt to continue medical treatment candesartan HCT - 32-25 mg, hydralazine 10 tid, dilt ER 180 qd, and encouraged pt to start the labetolol 200 bid as rx yesterday    Vitamin D deficiency Last vitamin D Lab Results  Component Value Date   VD25OH 18.34 (L) 11/16/2021   Low, reminded to start oral replacement   Hyperlipidemia associated with type 2 diabetes mellitus ([place]) Lab Results  Component Value Date   LDLCALC 55 11/16/2021   Stable, pt to continue current statin crestor 5 mg  Followup: Return in about 6 months (around 11/09/2022).  Cathlean Cower, MD 05/11/2022 10:55 AM [facility]

## 2022-05-11 NOTE — Assessment & Plan Note (Signed)
BP Readings from Last 3 Encounters:  05/11/22 132/74  05/10/22 (!) 152/79  12/14/21 122/78   Stable, pt to continue medical treatment candesartan HCT - 32-25 mg, hydralazine 10 tid, dilt ER 180 qd, and encouraged pt to start the labetolol 200 bid as rx yesterday

## 2022-05-11 NOTE — Assessment & Plan Note (Signed)
Last vitamin D Lab Results  Component Value Date   VD25OH 18.34 (L) 11/16/2021   Low, reminded to start oral replacement

## 2022-05-11 NOTE — Assessment & Plan Note (Signed)
Lab Results  Component Value Date   LDLCALC 55 11/16/2021   Stable, pt to continue current statin crestor 5 mg

## 2022-05-11 NOTE — Patient Instructions (Signed)
Please continue all other medications as before, and refills have been done if requested - the lancets  Please have the pharmacy call with any other refills you may need.  Please continue your efforts at being more active, low cholesterol diet, and weight control.  Please keep your appointments with your specialists as you may have planned  Please go to the LAB at the blood drawing area for the tests to be done  You will be contacted by phone if any changes need to be made immediately.  Otherwise, you will receive a letter about your results with an explanation, but please check with MyChart first.  Please remember to sign up for MyChart if you have not done so, as this will be important to you in the future with finding out test results, communicating by private email, and scheduling acute appointments online when needed.  Please make an Appointment to return in 6 months, or sooner if needed

## 2022-05-11 NOTE — Assessment & Plan Note (Signed)
Lab Results  Component Value Date   HGBA1C 5.9 11/16/2021   Stable, pt to continue current medical treatment prandin 1 mg with supper

## 2022-05-12 ENCOUNTER — Telehealth: Payer: Self-pay

## 2022-05-12 ENCOUNTER — Telehealth: Payer: Self-pay | Admitting: Internal Medicine

## 2022-05-12 NOTE — Telephone Encounter (Signed)
Hold for today and tomorrow. Resume at 1/2 pill twice daily on Sunday.  Thanks MJP

## 2022-05-12 NOTE — Telephone Encounter (Signed)
Ok to take HALF the labetolol for now, if the pill can be cut in half, o/w do not take at all and ROV next week

## 2022-05-12 NOTE — Telephone Encounter (Signed)
Called pt to explain about he message above. Pt understood.

## 2022-05-12 NOTE — Telephone Encounter (Signed)
Patient called and states that he was seen on 05/10/2022, and states that he was put on a new medication Labetalol 200 mg to be taken twice daily, Patient states that the medication is starting to make him feel very "jittery", he took his BP earlier this morning before he took the first pill and his BP was 144/82  two hours after he took the pill, his BP was  104/54, he also states that yesterday his BP was 108/68. Patient wanted to know if he should continue taking medication due to how it makes him feel.  Please advise

## 2022-05-12 NOTE — Telephone Encounter (Signed)
Patient states that since he started the lebatalol that his blood pressure has been too low 100/54.  Patient wants to know what he should do.  If he should continue taking or what.  Patient states that it made him feel weak and out of sorts.

## 2022-05-15 ENCOUNTER — Other Ambulatory Visit: Payer: Self-pay

## 2022-05-15 ENCOUNTER — Encounter (HOSPITAL_COMMUNITY): Payer: Self-pay | Admitting: Emergency Medicine

## 2022-05-15 ENCOUNTER — Ambulatory Visit (HOSPITAL_COMMUNITY)
Admission: EM | Admit: 2022-05-15 | Discharge: 2022-05-15 | Payer: Medicare Other | Attending: Internal Medicine | Admitting: Internal Medicine

## 2022-05-15 ENCOUNTER — Inpatient Hospital Stay (HOSPITAL_COMMUNITY)
Admission: EM | Admit: 2022-05-15 | Discharge: 2022-05-17 | DRG: 309 | Disposition: A | Discharge status: Home / Self Care | Payer: Medicare Other | Attending: Family Medicine | Admitting: Family Medicine

## 2022-05-15 ENCOUNTER — Telehealth: Payer: Self-pay

## 2022-05-15 DIAGNOSIS — E669 Obesity, unspecified: Secondary | ICD-10-CM | POA: Diagnosis present

## 2022-05-15 DIAGNOSIS — I2510 Atherosclerotic heart disease of native coronary artery without angina pectoris: Secondary | ICD-10-CM | POA: Diagnosis not present

## 2022-05-15 DIAGNOSIS — I129 Hypertensive chronic kidney disease with stage 1 through stage 4 chronic kidney disease, or unspecified chronic kidney disease: Secondary | ICD-10-CM | POA: Diagnosis not present

## 2022-05-15 DIAGNOSIS — I480 Paroxysmal atrial fibrillation: Secondary | ICD-10-CM | POA: Diagnosis not present

## 2022-05-15 DIAGNOSIS — Z7901 Long term (current) use of anticoagulants: Secondary | ICD-10-CM

## 2022-05-15 DIAGNOSIS — I248 Other forms of acute ischemic heart disease: Secondary | ICD-10-CM | POA: Diagnosis not present

## 2022-05-15 DIAGNOSIS — N179 Acute kidney failure, unspecified: Secondary | ICD-10-CM | POA: Diagnosis not present

## 2022-05-15 DIAGNOSIS — N189 Chronic kidney disease, unspecified: Secondary | ICD-10-CM | POA: Diagnosis not present

## 2022-05-15 DIAGNOSIS — I4891 Unspecified atrial fibrillation: Secondary | ICD-10-CM | POA: Diagnosis present

## 2022-05-15 DIAGNOSIS — Z87891 Personal history of nicotine dependence: Secondary | ICD-10-CM

## 2022-05-15 DIAGNOSIS — R002 Palpitations: Secondary | ICD-10-CM

## 2022-05-15 DIAGNOSIS — R778 Other specified abnormalities of plasma proteins: Secondary | ICD-10-CM | POA: Diagnosis present

## 2022-05-15 DIAGNOSIS — Z79899 Other long term (current) drug therapy: Secondary | ICD-10-CM

## 2022-05-15 DIAGNOSIS — Z8673 Personal history of transient ischemic attack (TIA), and cerebral infarction without residual deficits: Secondary | ICD-10-CM | POA: Diagnosis not present

## 2022-05-15 DIAGNOSIS — I471 Supraventricular tachycardia: Secondary | ICD-10-CM

## 2022-05-15 DIAGNOSIS — I080 Rheumatic disorders of both mitral and aortic valves: Secondary | ICD-10-CM | POA: Diagnosis not present

## 2022-05-15 DIAGNOSIS — N1832 Chronic kidney disease, stage 3b: Secondary | ICD-10-CM | POA: Diagnosis present

## 2022-05-15 DIAGNOSIS — Z6832 Body mass index (BMI) 32.0-32.9, adult: Secondary | ICD-10-CM

## 2022-05-15 DIAGNOSIS — Z823 Family history of stroke: Secondary | ICD-10-CM

## 2022-05-15 DIAGNOSIS — E119 Type 2 diabetes mellitus without complications: Secondary | ICD-10-CM | POA: Diagnosis not present

## 2022-05-15 DIAGNOSIS — I10 Essential (primary) hypertension: Secondary | ICD-10-CM | POA: Diagnosis not present

## 2022-05-15 DIAGNOSIS — E1122 Type 2 diabetes mellitus with diabetic chronic kidney disease: Secondary | ICD-10-CM | POA: Diagnosis not present

## 2022-05-15 DIAGNOSIS — I4892 Unspecified atrial flutter: Secondary | ICD-10-CM | POA: Diagnosis present

## 2022-05-15 DIAGNOSIS — R7989 Other specified abnormal findings of blood chemistry: Secondary | ICD-10-CM | POA: Diagnosis present

## 2022-05-15 DIAGNOSIS — E7800 Pure hypercholesterolemia, unspecified: Secondary | ICD-10-CM | POA: Diagnosis present

## 2022-05-15 DIAGNOSIS — E785 Hyperlipidemia, unspecified: Secondary | ICD-10-CM | POA: Diagnosis not present

## 2022-05-15 DIAGNOSIS — R5381 Other malaise: Secondary | ICD-10-CM | POA: Diagnosis not present

## 2022-05-15 LAB — BASIC METABOLIC PANEL
Anion gap: 12 (ref 5–15)
BUN: 26 mg/dL — ABNORMAL HIGH (ref 8–23)
CO2: 22 mmol/L (ref 22–32)
Calcium: 9.6 mg/dL (ref 8.9–10.3)
Chloride: 102 mmol/L (ref 98–111)
Creatinine, Ser: 2.23 mg/dL — ABNORMAL HIGH (ref 0.61–1.24)
GFR, Estimated: 31 mL/min — ABNORMAL LOW (ref >60)
Glucose, Bld: 136 mg/dL — ABNORMAL HIGH (ref 70–99)
Potassium: 3.5 mmol/L (ref 3.5–5.1)
Sodium: 136 mmol/L (ref 135–145)

## 2022-05-15 LAB — CBC WITH DIFFERENTIAL/PLATELET
Abs Immature Granulocytes: 0.03 10*3/uL (ref 0.00–0.07)
Basophils Absolute: 0 10*3/uL (ref 0.0–0.1)
Basophils Relative: 0 %
Eosinophils Absolute: 0.1 10*3/uL (ref 0.0–0.5)
Eosinophils Relative: 1 %
HCT: 43.1 % (ref 39.0–52.0)
Hemoglobin: 13.7 g/dL (ref 13.0–17.0)
Immature Granulocytes: 0 %
Lymphocytes Relative: 30 %
Lymphs Abs: 2.9 10*3/uL (ref 0.7–4.0)
MCH: 23.8 pg — ABNORMAL LOW (ref 26.0–34.0)
MCHC: 31.8 g/dL (ref 30.0–36.0)
MCV: 75 fL — ABNORMAL LOW (ref 80.0–100.0)
Monocytes Absolute: 1 10*3/uL (ref 0.1–1.0)
Monocytes Relative: 11 %
Neutro Abs: 5.6 10*3/uL (ref 1.7–7.7)
Neutrophils Relative %: 58 %
Platelets: 238 10*3/uL (ref 150–400)
RBC: 5.75 MIL/uL (ref 4.22–5.81)
RDW: 18.2 % — ABNORMAL HIGH (ref 11.5–15.5)
WBC: 9.6 10*3/uL (ref 4.0–10.5)
nRBC: 0 % (ref 0.0–0.2)

## 2022-05-15 LAB — TROPONIN I (HIGH SENSITIVITY): Troponin I (High Sensitivity): 251 ng/L (ref <18)

## 2022-05-15 LAB — HEMOGLOBIN A1C: Hgb A1c MFr Bld: 5.9 % (ref 4.6–6.5)

## 2022-05-15 LAB — MAGNESIUM: Magnesium: 1.8 mg/dL (ref 1.7–2.4)

## 2022-05-15 MED ORDER — METOPROLOL TARTRATE 5 MG/5ML IV SOLN
2.5000 mg | INTRAVENOUS | Status: DC | PRN
  Filled 2022-05-15: qty 5

## 2022-05-15 NOTE — ED Notes (Signed)
Patient is being discharged from the Urgent Care and sent to the Emergency Department via [facility]. Per Mare Ferrari, NP, patient is in need of higher level of care due to chest pains, SVT on EKG, HR 160s. Patient is aware and verbalizes understanding of plan of care.  Vitals:   05/15/22 1918 05/15/22 1928  BP:  129/65  Pulse: (!) 157 (!) 165  Resp:  19  Temp:  98 F (36.7 C)  SpO2:  99%

## 2022-05-15 NOTE — Telephone Encounter (Signed)
Okay to do so. Looks like blood pressure has been running quite low. Stop labetalol, resume metoprolol tomorrow.  Thanks MJP

## 2022-05-15 NOTE — Telephone Encounter (Signed)
Spoke with patient this morning and he stated that he is still having problems with his medication Labetalol 200 mg, he states that he is only taking half as instructed by you last week but patient states that even after he takes the half his HR was still high last night it was; 137, this morning it was 104, He also states that his BP was low it read 86/53. He wanted to know could he back on the metoprolol he states his heart did not race with that particular medication and it seemed to work better.  Please advise

## 2022-05-15 NOTE — ED Notes (Signed)
[facility] at bedside transporting pt TO ED

## 2022-05-15 NOTE — ED Triage Notes (Signed)
Patient states having heart racing, palpitations, and chest fullness. Patient states he saw his heart doctor due to elevated blood pressure. Was taken off metoprolol and placed on a new BP medication. States the new medication caused a drop in BP and elevated heart rate. Was told by cardiology to cut medication in half. States that Bp was still low and heart rate elevated.

## 2022-05-15 NOTE — ED Provider Notes (Signed)
[facility] EMERGENCY DEPARTMENT Provider Note   CSN: [id number] Arrival date & time: 05/15/22  1949     History  Chief Complaint  Patient presents with   Tachycardia    Steen Kick is a 71 y.o. male with paroxysmal A-fib on Eliquis, hypertension, obesity, prediabetes presents with SOB and palpitations.    Patient presents to urgent care for evaluation of diaphoresis, dizziness, SOB, and heart racing that started approximately 3 days ago. Happening intermittently since then, does not notice that it is exertional. He was seen by his cardiologist 3 days ago where they changed his medications from metoprolol to labetalol. Patient believes this may have caused his symptoms. Denies current chest pain or recent LEE, but reports shortness of breath, dizziness, and feeling "feverish". He takes Eliquis daily for stroke prevention due to history of atrial fibrillation and denies recent falls or abdominal discomfort. He is neurologically intact to baseline per significant other who is at bedside. EKG performed in clinic shows ventricular heart rate of 157 beats per minute in SVT. Attempted vagal maneuvers in clinic without successful conversion.    HPI     Home Medications Prior to Admission medications   Medication Sig Start Date End Date Taking? Authorizing Provider  Accu-Chek FastClix Lancets MISC USE TO CHECK BLOOD SUGAR AS DIRECTED ONCE DAILY 05/11/22   Corwin Levins, MD  albuterol (VENTOLIN HFA) 108 (90 Base) MCG/ACT inhaler Inhale 2 puffs into the lungs every 6 (six) hours as needed for wheezing or shortness of breath. 11/16/21   Corwin Levins, MD  ammonium lactate (AMLACTIN) 12 % cream  04/13/21   [provider]  apixaban (ELIQUIS) 5 MG TABS tablet Take 1 tablet (5 mg total) by mouth 2 (two) times daily. 10/14/21   Cantwell, Celeste C, PA-C  Candesartan Cilexetil-HCTZ 32-25 MG TABS Take 1 tablet by mouth daily. 11/16/21   Corwin Levins, MD  Cholecalciferol 50 MCG  (2000 UT) TABS 1 tab by mouth once daily 11/20/21   Corwin Levins, MD  clotrimazole-betamethasone (LOTRISONE) cream Apply 1 application topically 2 (two) times daily. 10/14/21   Candelaria Stagers, DPM  diltiazem (CARDIZEM CD) 180 MG 24 hr capsule Take 1 capsule (180 mg total) by mouth every evening. 12/01/21   Cantwell, Celeste C, PA-C  glucose blood (ACCU-CHEK GUIDE) test strip 1 each by Other route daily. And lancets 1/day 06/15/21   Romero Belling, MD  hydrALAZINE (APRESOLINE) 100 MG tablet Take 1 tablet (100 mg total) by mouth 3 (three) times daily. 10/06/21   Cantwell, Celeste C, PA-C  isosorbide dinitrate (ISORDIL) 20 MG tablet Take 1 tablet (20 mg total) by mouth 3 (three) times daily. 10/06/21   Cantwell, Celeste C, PA-C  labetalol (NORMODYNE) 200 MG tablet Take 1 tablet (200 mg total) by mouth 2 (two) times daily. 05/10/22   Patwardhan, Anabel Bene, MD  nitroGLYCERIN (NITROSTAT) 0.4 MG SL tablet Place 1 tablet (0.4 mg total) under the tongue every 5 (five) minutes as needed for chest pain. 08/25/20 05/25/21  Cantwell, Celeste C, PA-C  potassium chloride (KLOR-CON 10) 10 MEQ tablet Take 1 tablet (10 mEq total) by mouth daily. 11/20/21   Corwin Levins, MD  repaglinide (PRANDIN) 1 MG tablet Take 1 tablet (1 mg total) by mouth daily with supper. 12/14/21   Romero Belling, MD  rosuvastatin (CRESTOR) 5 MG tablet Take 1 tablet (5 mg total) by mouth at bedtime. 1 tab by mouth once daily 11/16/21   Corwin Levins, MD  Allergies    Patient has no known allergies.    Review of Systems   Review of Systems Review of systems negative for CP.  A 10 point review of systems was performed and is negative unless otherwise reported in HPI.  Physical Exam Updated Vital Signs BP (!) 141/110 (BP Location: Right Arm)   Pulse (!) 155   Temp 98.2 F (36.8 C) (Oral)   Resp 18   SpO2 97%  Physical Exam General: Normal appearing male, lying in bed.  HEENT: PERRLA, Sclera anicteric, MMM, trachea midline. Cardiology:  Tachycardic regular rhythm, no murmurs/rubs/gallops. BL radial and DP pulses equal bilaterally.  Resp: Normal respiratory rate and effort. CTAB, no wheezes, rhonchi, crackles.  Abd: Soft, non-tender, non-distended. No rebound tenderness or guarding.  GU: Deferred. MSK: No peripheral edema. No cyanosis or clubbing. Skin: warm, dry. No rashes or lesions. Neuro: A&Ox4, CNs II-XII grossly intact. MAEs. Sensation grossly intact.  Psych: Normal mood and affect.   ED Results / Procedures / Treatments   Labs (all labs ordered are listed, but only abnormal results are displayed) Labs Reviewed  CBC WITH DIFFERENTIAL/PLATELET - Abnormal; Notable for the following components:      Result Value   MCV 75.0 (*)    MCH 23.8 (*)    RDW 18.2 (*)    All other components within normal limits  BASIC METABOLIC PANEL - Abnormal; Notable for the following components:   Glucose, Bld 136 (*)    BUN 26 (*)    Creatinine, Ser 2.23 (*)    GFR, Estimated 31 (*)    All other components within normal limits  CBC - Abnormal; Notable for the following components:   Hemoglobin 12.6 (*)    HCT 38.7 (*)    MCV 74.6 (*)    MCH 24.3 (*)    RDW 17.3 (*)    All other components within normal limits  CREATININE, SERUM - Abnormal; Notable for the following components:   Creatinine, Ser 2.07 (*)    GFR, Estimated 34 (*)    All other components within normal limits  TROPONIN I (HIGH SENSITIVITY) - Abnormal; Notable for the following components:   Troponin I (High Sensitivity) 251 (*)    All other components within normal limits    EKG 05/15/22 19:15 PM  Supraventricular tachycardia ST & T wave abnormality, consider lateral ischemia Abnormal ECG When compared with ECG of 04-Jul-2020 06:58, Supraventricular tachycardia has replaced Sinus rhythm  SVT at 157 bpm, normal axis, no prolonged QT, no P waves seen.      05/15/22 20:45 PM Sinus tachycardia Paired ventricular premature complexes Abnormal T, consider  ischemia, lateral leads Baseline wander in lead(s) V3   ST 100 bpm, normal axis, a pair of PVCs     Procedures Procedures    Medications Ordered in ED Medications - No data to display  ED Course/ Medical Decision Making/ A&P                          Medical Decision Making Amount and/or Complexity of Data Reviewed Labs: ordered. Decision-making details documented in ED Course.  Risk Decision regarding hospitalization.   Patient on arrival w/ rates in 150s with stable blood pressure at this time. Alert and oriented x4 with no CP, some palpitations/dizziness.   Patient with palpitations and regular narrow complex tachycardia on the monitor, rate ~155 bpm steady. Represents a supraventricular tachycardia, possibly AVNRT vs atrial flutter 2:1 conduction. Patient with several  medication changes recently including metoprolol and labetalol. Patient denies chest pain but endorses SOB and dizziness. Consider the arrhythmia, which patient has likely been in and out of for a few days given report of symptoms, vs ACS and will obtain troponin. Consider electrolyte abnormalities, renal injury, dehydration.  I have personally reviewed and interpreted all labs and imaging.   Clinical Course as of 05/19/22 0100  Mon May 15, 2022  2031 Tried blowing through syringe with leg raise x2, which did not convert rhythm [HN]  2047 Had ordered 2.5 mg IV metoprolol q78min PRN x 3 doses for likely a flutter 2:1 conduction, but when RN went to give the medication, patient's HR was 106 bpm, sinus rhythm. Patient spontaneously converted. Will consult to cardiology for further evaluation and help w/ meds given recent changes. [HN]  2132 D/w cardiology. Will admit to medicine for obs stay and cardiac monitoring, medication adjustments. [HN]  2224 Troponin I (High Sensitivity)(!!): 251 Cardiology following [HN]    Clinical Course User Index [HN] Loetta Rough, MD   Labs demonstrate Cr up to 2.23 from BL  1.7, AKI. Electrolytes wnl. No leukocytosis or anemia. BL trop 251, will be trended as inpatient.   Dispo: Admit        Final Clinical Impression(s) / ED Diagnoses Final diagnoses:  Atrial flutter, unspecified type (HCC)  AKI (acute kidney injury) (HCC)    Rx / DC Orders ED Discharge Orders     None        This note was created using dictation software, which may contain spelling or grammatical errors.    Loetta Rough, MD 05/22/22 2212

## 2022-05-15 NOTE — ED Notes (Signed)
Pt noted to convert to a ST rate of 100-106; repeat ekg given to Dr.Naasz

## 2022-05-15 NOTE — ED Notes (Signed)
Charge RN at [facility] ED made aware patient coming via [place]

## 2022-05-15 NOTE — ED Triage Notes (Addendum)
Patient coming from UC with palpitations.  Patient has been trying to get his HTN meds working.  He was on Labetalol and had hypotension, was talking metoprolol and has had many changes in doses.  Patient has been having palpitations for the last few days.  Patient is in SVT rate of 160.

## 2022-05-15 NOTE — ED Notes (Signed)
[facility] made aware of transport needed.  

## 2022-05-16 ENCOUNTER — Observation Stay (HOSPITAL_COMMUNITY): Payer: Medicare Other

## 2022-05-16 DIAGNOSIS — I129 Hypertensive chronic kidney disease with stage 1 through stage 4 chronic kidney disease, or unspecified chronic kidney disease: Secondary | ICD-10-CM | POA: Diagnosis present

## 2022-05-16 DIAGNOSIS — Z79899 Other long term (current) drug therapy: Secondary | ICD-10-CM | POA: Diagnosis not present

## 2022-05-16 DIAGNOSIS — Z823 Family history of stroke: Secondary | ICD-10-CM | POA: Diagnosis not present

## 2022-05-16 DIAGNOSIS — R778 Other specified abnormalities of plasma proteins: Principal | ICD-10-CM | POA: Diagnosis present

## 2022-05-16 DIAGNOSIS — Z87891 Personal history of nicotine dependence: Secondary | ICD-10-CM | POA: Diagnosis not present

## 2022-05-16 DIAGNOSIS — N189 Chronic kidney disease, unspecified: Secondary | ICD-10-CM | POA: Diagnosis not present

## 2022-05-16 DIAGNOSIS — E785 Hyperlipidemia, unspecified: Secondary | ICD-10-CM | POA: Diagnosis not present

## 2022-05-16 DIAGNOSIS — I2510 Atherosclerotic heart disease of native coronary artery without angina pectoris: Secondary | ICD-10-CM | POA: Diagnosis present

## 2022-05-16 DIAGNOSIS — I471 Supraventricular tachycardia: Secondary | ICD-10-CM | POA: Insufficient documentation

## 2022-05-16 DIAGNOSIS — I480 Paroxysmal atrial fibrillation: Secondary | ICD-10-CM | POA: Diagnosis present

## 2022-05-16 DIAGNOSIS — I4719 Other supraventricular tachycardia: Secondary | ICD-10-CM | POA: Insufficient documentation

## 2022-05-16 DIAGNOSIS — I248 Other forms of acute ischemic heart disease: Secondary | ICD-10-CM | POA: Diagnosis present

## 2022-05-16 DIAGNOSIS — E782 Mixed hyperlipidemia: Secondary | ICD-10-CM | POA: Insufficient documentation

## 2022-05-16 DIAGNOSIS — Z7901 Long term (current) use of anticoagulants: Secondary | ICD-10-CM | POA: Diagnosis not present

## 2022-05-16 DIAGNOSIS — I4892 Unspecified atrial flutter: Secondary | ICD-10-CM | POA: Diagnosis present

## 2022-05-16 DIAGNOSIS — E669 Obesity, unspecified: Secondary | ICD-10-CM | POA: Diagnosis present

## 2022-05-16 DIAGNOSIS — E7800 Pure hypercholesterolemia, unspecified: Secondary | ICD-10-CM | POA: Diagnosis present

## 2022-05-16 DIAGNOSIS — I4891 Unspecified atrial fibrillation: Secondary | ICD-10-CM | POA: Diagnosis present

## 2022-05-16 DIAGNOSIS — R5381 Other malaise: Secondary | ICD-10-CM | POA: Diagnosis present

## 2022-05-16 DIAGNOSIS — Z8673 Personal history of transient ischemic attack (TIA), and cerebral infarction without residual deficits: Secondary | ICD-10-CM | POA: Insufficient documentation

## 2022-05-16 DIAGNOSIS — E1122 Type 2 diabetes mellitus with diabetic chronic kidney disease: Secondary | ICD-10-CM | POA: Diagnosis present

## 2022-05-16 DIAGNOSIS — Z6832 Body mass index (BMI) 32.0-32.9, adult: Secondary | ICD-10-CM | POA: Diagnosis not present

## 2022-05-16 DIAGNOSIS — I10 Essential (primary) hypertension: Secondary | ICD-10-CM | POA: Diagnosis not present

## 2022-05-16 DIAGNOSIS — N179 Acute kidney failure, unspecified: Secondary | ICD-10-CM | POA: Diagnosis present

## 2022-05-16 DIAGNOSIS — N1832 Chronic kidney disease, stage 3b: Secondary | ICD-10-CM | POA: Diagnosis present

## 2022-05-16 LAB — CBC
HCT: 38.7 % — ABNORMAL LOW (ref 39.0–52.0)
Hemoglobin: 12.6 g/dL — ABNORMAL LOW (ref 13.0–17.0)
MCH: 24.3 pg — ABNORMAL LOW (ref 26.0–34.0)
MCHC: 32.6 g/dL (ref 30.0–36.0)
MCV: 74.6 fL — ABNORMAL LOW (ref 80.0–100.0)
Platelets: 237 10*3/uL (ref 150–400)
RBC: 5.19 MIL/uL (ref 4.22–5.81)
RDW: 17.3 % — ABNORMAL HIGH (ref 11.5–15.5)
WBC: 7.6 10*3/uL (ref 4.0–10.5)
nRBC: 0 % (ref 0.0–0.2)

## 2022-05-16 LAB — CREATININE, SERUM
Creatinine, Ser: 2.07 mg/dL — ABNORMAL HIGH (ref 0.61–1.24)
GFR, Estimated: 34 mL/min — ABNORMAL LOW (ref >60)

## 2022-05-16 LAB — ECHOCARDIOGRAM COMPLETE
AR max vel: 1.85 cm2
AV Area VTI: 2.33 cm2
AV Area mean vel: 1.82 cm2
AV Mean grad: 12 mmHg
AV Peak grad: 22.5 mmHg
Ao pk vel: 2.37 m/s
Area-P 1/2: 2.5 cm2
MV VTI: 1.99 cm2
P 1/2 time: 592 msec
S' Lateral: 2.9 cm

## 2022-05-16 LAB — TROPONIN I (HIGH SENSITIVITY)
Troponin I (High Sensitivity): 320 ng/L (ref <18)
Troponin I (High Sensitivity): 334 ng/L (ref <18)
Troponin I (High Sensitivity): 348 ng/L (ref <18)
Troponin I (High Sensitivity): 333 ng/L (ref <18)

## 2022-05-16 LAB — HIV ANTIBODY (ROUTINE TESTING W REFLEX): HIV Screen 4th Generation wRfx: NONREACTIVE

## 2022-05-16 MED ORDER — POLYETHYLENE GLYCOL 3350 17 G PO PACK
17.0000 g | PACK | Freq: Every day | ORAL | Status: DC | PRN
  Administered 2022-05-16: 17 g via ORAL
  Filled 2022-05-16: qty 1

## 2022-05-16 MED ORDER — HYDRALAZINE HCL 50 MG PO TABS
100.0000 mg | ORAL_TABLET | Freq: Three times a day (TID) | ORAL | Status: DC
  Administered 2022-05-16 – 2022-05-17 (×3): 100 mg via ORAL
  Filled 2022-05-16 (×3): qty 2

## 2022-05-16 MED ORDER — PROCHLORPERAZINE EDISYLATE 10 MG/2ML IJ SOLN: 5.0000 mg | Freq: Four times a day (QID) | INTRAMUSCULAR | Status: DC | PRN

## 2022-05-16 MED ORDER — METOPROLOL TARTRATE 50 MG PO TABS
50.0000 mg | ORAL_TABLET | Freq: Two times a day (BID) | ORAL | Status: DC
  Administered 2022-05-16 – 2022-05-17 (×3): 50 mg via ORAL
  Filled 2022-05-16 (×2): qty 1
  Filled 2022-05-16: qty 2

## 2022-05-16 MED ORDER — ACETAMINOPHEN 325 MG PO TABS: 650.0000 mg | ORAL_TABLET | Freq: Four times a day (QID) | ORAL | Status: DC | PRN

## 2022-05-16 MED ORDER — METOPROLOL TARTRATE 25 MG PO TABS: 12.5000 mg | ORAL_TABLET | Freq: Two times a day (BID) | ORAL | Status: DC

## 2022-05-16 MED ORDER — ROSUVASTATIN CALCIUM 5 MG PO TABS
5.0000 mg | ORAL_TABLET | Freq: Every day | ORAL | Status: DC
  Administered 2022-05-16: 5 mg via ORAL
  Filled 2022-05-16: qty 1

## 2022-05-16 MED ORDER — DILTIAZEM HCL ER COATED BEADS 180 MG PO CP24
180.0000 mg | ORAL_CAPSULE | Freq: Every evening | ORAL | Status: DC
  Administered 2022-05-16: 180 mg via ORAL
  Filled 2022-05-16 (×2): qty 1

## 2022-05-16 MED ORDER — ISOSORBIDE DINITRATE 10 MG PO TABS
20.0000 mg | ORAL_TABLET | Freq: Three times a day (TID) | ORAL | Status: DC
  Administered 2022-05-16 – 2022-05-17 (×3): 20 mg via ORAL
  Filled 2022-05-16 (×3): qty 2

## 2022-05-16 MED ORDER — APIXABAN 5 MG PO TABS
5.0000 mg | ORAL_TABLET | Freq: Two times a day (BID) | ORAL | Status: DC
  Administered 2022-05-16 – 2022-05-17 (×2): 5 mg via ORAL
  Filled 2022-05-16 (×2): qty 1

## 2022-05-16 MED ORDER — SODIUM CHLORIDE 0.9 % IV SOLN: INTRAVENOUS | Status: DC

## 2022-05-16 NOTE — Consult Note (Signed)
CARDIOLOGY CONSULT NOTE  Patient ID: Roberto Rivera MRN: [id number] DOB/AGE: Aug 06, 1951 71 y.o.  Admit date: 05/15/2022 Attending physician: Azucena Fallen, MD Primary Physician:  Corwin Levins, MD Outpatient Cardiologist: Dr. Truett Mainland Inpatient Cardiologist: Tessa Lerner, DO, [facility]  Reason of consultation: Shortness of breath, supraventricular tachycardia Referring physician: Dr. Vivi Barrack  Chief complaint: Palpitations and shortness of breath  HPI:  Roberto Rivera is a 71 y.o. African-American male who presents with a chief complaint of " shortness of breath and palpitations." His past medical history and cardiovascular risk factors include: Hypertension, hyperlipidemia, history of CAD, PAF, history of stroke.  Presented to the hospital with chief complaint of shortness of breath and palpitations.  He was noted to be in supraventricular tachycardia likely AVNRT and transferred to [facility], ED for further evaluation and management.  During his ER course he spontaneously converted to normal sinus rhythm.  However given his palpitations/SVT episode and recent uncontrolled hypertension patient was admitted for further evaluation and management and observation on telemetry.  Overnight patient denies any reoccurrence of palpitations.  Telemetry does not illustrate any reoccurrence of PSVT or other dysrhythmias.  Patient denies anginal discomfort or heart failure symptoms.  Patient states that he was recently seen by my partner Dr. Rosemary Holms and on 05/10/2022 and his antihypertensive medications were being changed due to elevated blood pressures and subconjunctival hemorrhage.  However he did not tolerate labetalol and plan was to transition him back to metoprolol today.  ALLERGIES: No Known Allergies  PAST MEDICAL HISTORY: Past Medical History:  Diagnosis Date   ALLERGIC RHINITIS 04/01/2007   Diabetes mellitus    DIABETES MELLITUS, TYPE II 04/01/2007   ERECTILE DYSFUNCTION, ORGANIC  02/07/2008   Hypercholesteremia    HYPERCHOLESTEROLEMIA 02/07/2008   Hypertension    HYPERTENSION 04/01/2007   MICROCYTOSIS 02/07/2008   OSTEOARTHRITIS, SPINE 02/07/2008   PAF (paroxysmal atrial fibrillation) (HCC) 07/11/2020   Stroke (HCC)    THYROID CYST 02/07/2008    PAST SURGICAL HISTORY: Past Surgical History:  Procedure Laterality Date   cyst removal  01/2021   back   LEFT HEART CATH AND CORONARY ANGIOGRAPHY N/A 09/28/2020   Procedure: LEFT HEART CATH AND CORONARY ANGIOGRAPHY;  Surgeon: Elder Negus, MD;  Location: [facility] INVASIVE CV LAB;  Service: Cardiovascular;  Laterality: N/A;    FAMILY HISTORY: The patient's family history includes Cancer in his mother; Stroke in his sister.   SOCIAL HISTORY:  The patient  reports that he quit smoking about 22 months ago. His smoking use included cigarettes. He smoked an average of 1 pack per day. He has never used smokeless tobacco. He reports that he does not drink alcohol and does not use drugs.  MEDICATIONS: Current Outpatient Medications  Medication Instructions   Accu-Chek FastClix Lancets MISC USE TO CHECK BLOOD SUGAR AS DIRECTED ONCE DAILY   albuterol (VENTOLIN HFA) 108 (90 Base) MCG/ACT inhaler 2 puffs, Inhalation, Every 6 hours PRN   ammonium lactate (AMLACTIN) 12 % cream No dose, route, or frequency recorded.   apixaban (ELIQUIS) 5 mg, Oral, 2 times daily   Candesartan Cilexetil-HCTZ 32-25 MG TABS 1 tablet, Oral, Daily   Cholecalciferol 50 MCG (2000 UT) TABS 1 tab by mouth once daily   clotrimazole-betamethasone (LOTRISONE) cream 1 application , Topical, 2 times daily   diltiazem (CARDIZEM CD) 180 mg, Oral, Every evening   glucose blood (ACCU-CHEK GUIDE) test strip 1 each, Other, Daily, And lancets 1/day   hydrALAZINE (APRESOLINE) 100 mg, Oral, 3 times daily  isosorbide dinitrate (ISORDIL) 20 mg, Oral, 3 times daily   labetalol (NORMODYNE) 200 mg, Oral, 2 times daily   nitroGLYCERIN (NITROSTAT) 0.4 mg, Sublingual, Every  5 min PRN   potassium chloride (KLOR-CON 10) 10 MEQ tablet 10 mEq, Oral, Daily   prednisoLONE acetate (PRED FORTE) 1 % ophthalmic suspension SMARTSIG:In Eye(s)   repaglinide (PRANDIN) 1 mg, Oral, Daily with supper   rosuvastatin (CRESTOR) 5 mg, Oral, Daily at bedtime, 1 tab by mouth once daily    REVIEW OF SYSTEMS: Review of Systems  Cardiovascular:  Positive for palpitations (On arrival, now asymptomatic). Negative for chest pain, claudication, dyspnea on exertion, irregular heartbeat, leg swelling, near-syncope, orthopnea, paroxysmal nocturnal dyspnea and syncope.  Respiratory:  Positive for shortness of breath (On arrival, now asymptomatic).   Hematologic/Lymphatic: Negative for bleeding problem.  Musculoskeletal:  Negative for muscle cramps and myalgias.  Neurological:  Negative for dizziness and light-headedness.  All other systems reviewed and are negative.   PHYSICAL EXAM: Today's Vitals   05/16/22 0530 05/16/22 0600 05/16/22 0630 05/16/22 0700  BP: 117/78 116/87 119/76 118/87  Pulse: 82 71 66 80  Resp: 15 17 17 17   Temp:      TempSrc:      SpO2: 98% 99% 99% 96%  PainSc:       There is no height or weight on file to calculate BMI.  No intake or output data in the 24 hours ending 05/16/22 0939  Net IO Since Admission: No IO data has been entered for this period [05/16/22 0939]    Physical Exam  Constitutional: No distress.  Age appropriate, hemodynamically stable.   Neck: No JVD present.  Cardiovascular: Normal rate, regular rhythm, S1 normal, S2 normal, intact distal pulses and normal pulses. Exam reveals no gallop, no S3 and no S4.  No murmur heard. Pulmonary/Chest: Effort normal and breath sounds normal. No stridor. He has no wheezes. He has no rales.  Abdominal: Soft. Bowel sounds are normal. He exhibits no distension. There is no abdominal tenderness.  Musculoskeletal:        General: No edema.     Cervical back: Neck supple.  Neurological: He is alert and  oriented to person, place, and time. He has intact cranial nerves (2-12).  Skin: Skin is warm and moist.   RADIOLOGY: No results found.  LABORATORY DATA: Lab Results  Component Value Date   WBC 7.6 05/16/2022   HGB 12.6 (L) 05/16/2022   HCT 38.7 (L) 05/16/2022   MCV 74.6 (L) 05/16/2022   PLT 237 05/16/2022    Recent Labs  Lab 05/11/22 1030 05/15/22 2008 05/16/22 0426  NA 137 136  --   K 3.5 3.5  --   CL 100 102  --   CO2 30 22  --   BUN 24* 26*  --   CREATININE 1.79* 2.23* 2.07*  CALCIUM 9.9 9.6  --   PROT 7.7  --   --   BILITOT 0.6  --   --   ALKPHOS 80  --   --   ALT 10  --   --   AST 15  --   --   GLUCOSE 125* 136*  --     Lipid Panel  Lab Results  Component Value Date   CHOL 109 05/11/2022   HDL 41.20 05/11/2022   LDLCALC 56 05/11/2022   TRIG 59.0 05/11/2022   CHOLHDL 3 05/11/2022    BNP (last 3 results) No results for input(s): "BNP" in the last 8760  hours.  HEMOGLOBIN A1C Lab Results  Component Value Date   HGBA1C 5.9 05/11/2022   MPG 162.81 06/30/2020    Cardiac Panel (last 3 results) Recent Labs    05/15/22 2302 05/16/22 0426 05/16/22 0742  TROPONINIHS 320* 334* 348*     TSH Recent Labs    11/16/21 1542  TSH 1.46     CARDIAC DATABASE: EKG: 05/15/2022: 1915: Narrow complex tachycardia, 157 bpm ST-T changes in the high lateral and lateral leads cannot rule out underlying ischemia versus rate related changes.  2045: Sinus tachycardia, 100 bpm, occasional PVCs, ST-T changes in the high lateral and lateral leads consider underlying ischemia versus rate related changes.  05/16/2022: Normal sinus rhythm, 83 bpm, TWI in the anterolateral and lateral leads, consider lateral ischemia.  Without underlying injury pattern.  Left heart cath and coronary angiography 09/28/2020:  LM: Large, normal LAD: Large, minimal luminal irregularities Ramus: Large, normal LCx: Large, normal RCA:  Prox calcific 50% stenosis, distal RCA subtotal occlusion  with TIMI 1 flow   PCV MYOCARDIAL PERFUSION WO LEXISCAN 08/04/2020 Nondiagnostic ECG stress due to pharmacologic stress. Resting EKG demonstrated atrial fibrillation with rapid ventricular response. Non-specific ST-T abnormality. Peak EKG/ demonstrated atrial fibrillation with rapid ventricular response. There is a reversible moderate defect in the lateral and inferior regions. consistent with severe ischemia. Overall LV systolic function is abnormal with regional wall motion abnormalities in the same area.  Stress LV EF: 34%. No previous exam available for comparison. High risk study.   Venous Bilateral Ultrasound 07/02/2020: RIGHT:  - There is no evidence of deep vein thrombosis in the lower extremity.  - No cystic structure found in the popliteal fossa.  LEFT:  - There is no evidence of deep vein thrombosis in the lower extremity.  - No cystic structure found in the popliteal fossa.   ECHOCARDIOGRAM COMPLETE 07/01/2020 1. Left ventricular ejection fraction, by estimation, is 60 to 65%. The left ventricle has normal function. The left ventricle has no regional wall motion abnormalities. There is mild left ventricular hypertrophy. Left ventricular diastolic parameters are consistent with Grade I diastolic dysfunction (impaired relaxation).   2. Right ventricular systolic function is normal. The right ventricular size is normal. Tricuspid regurgitation signal is inadequate for assessing PA pressure.   3. Left atrial size was severely dilated.   4. The mitral valve is normal in structure. No evidence of mitral valve regurgitation. No evidence of mitral stenosis. There was chordal systolic anterior motion noted (not valvular).  5. The aortic valve is tricuspid. Aortic valve regurgitation is mild. Mild aortic valve stenosis. Aortic valve mean gradient measures 14.0 mmHg.   6. Aortic dilatation noted. There is mild dilatation of the ascending aorta, measuring 40 mm.  7. The inferior vena cava is  normal in size with greater than 50% respiratory variability, suggesting right atrial pressure of 3 mmHg.   VAS US CAROTID 07/01/2020 Right Carotid: Velocities in the right ICA are consistent with a 1-39% stenosis.  Left Carotid: Velocities in the left ICA are consistent with a 1-39% stenosis.  Vertebrals:  Bilateral vertebral arteries demonstrate antegrade flow.  Subclavians: Normal flow hemodynamics were seen in bilateral subclavian arteries.   IMPRESSION & RECOMMENDATIONS: Roberto Rivera is a 71 y.o. African-American male whose past medical history and cardiovascular risk factors include: Hypertension, hyperlipidemia, history of CAD, PAF, history of stroke.  Impression:  Narrow complex tachycardia likely suggestive of AVNRT Acute kidney injury. Elevated troponins likely secondary to supply demand ischemia in the setting of SVT,  AKI Benign essential hypertension with chronic kidney disease. Hyperlipidemia. Establish CAD without angina pectoris. Paroxysmal atrial fibrillation. History of CVA Long term oral anticoagulation  Plan:  Patient presented to the ED for complaints of shortness of breath and palpitations and was noted to have narrow complex tachycardia suggestive of AVNRT.  He also has history of paroxysmal atrial fibrillation.  He has been on oral anticoagulation without interruption.  He did spontaneously convert to sinus rhythm.  Termination strip documented into the media section and noted above for further reference.  Clinically denies anginal discomfort or heart failure symptoms.  His troponins are elevated likely secondary to supply demand ischemia in setting of tachycardia, known CAD in the RCA distribution as per the recent heart catheterization and prior MPI, and decreased excretion in the setting of acute kidney injury.  Trend troponins until they peak.  Would recommend/agree with the pending echocardiogram.  If the LVEF remains relatively stable when compared to prior  studies and no new regional wall motion abnormalities are noted patient can be discharged home later today with close follow-up with his primary cardiologist Dr. Virgina Jock.  We will start him on 0.9 normal saline at 75 cc an hour.  Recommend checking a BMP prior to discharge.  Continue telemetry.  Home meds reconciled.  We will restart hydralazine and isosorbide dinitrate along with the diltiazem and metoprolol.  We will hold off on candesartan/HCTZ in the setting of AKI.  Patient did not tolerate labetalol and therefore will discontinue.  We will restart him on his prior dose of Lopressor 50 mg p.o. twice daily  Anticipate that he will be discharged the next 24 to 48 hours.  Recommendations to be conveyed to the attending physician.   Total encounter time 68 minutes. *Total Encounter Time as defined by the Centers for Medicare and Medicaid Services includes, in addition to the face-to-face time of a patient visit (documented in the note above) non-face-to-face time: obtaining and reviewing outside history, ordering and reviewing medications, tests or procedures, care coordination (communications with other health care professionals or caregivers) and documentation in the medical record.  Patient's questions and concerns were addressed to his satisfaction. He voices understanding of the instructions provided during this encounter.   This note was created using a voice recognition software as a result there may be grammatical errors inadvertently enclosed that do not reflect the nature of this encounter. Every attempt is made to correct such errors.  Mechele Claude [facility]  Pager: [phone] Office: [phone] 05/16/2022, 9:39 AM

## 2022-05-16 NOTE — Progress Notes (Signed)
PROGRESS NOTE    Roberto Rivera  [id number] DOB: 01/21/1951 DOA: 05/15/2022 PCP: Corwin Levins, MD   Brief Narrative:  DAY ZERO NOTE - see admission note this am for further details  Roberto Rivera is a 71 y.o. male with medical history significant for paroxysmal A-fib on Eliquis, hypertension, obesity, prediabetes, CKD3b who presented to [facility] ED with complaints of shortness of breath associated with palpitations x2 days. Presented with symptomatic flutter - resolved with resumption of home beta blockers.  Cards following - appreciate insight/recs.   Assessment & Plan:   Principal Problem:   Elevated troponin   Symptomatic afib/flutter - resolved Elevated troponin in the setting of A-fib with RVR - Cardiology following - ok to DC once Echo back (ensure no wall motion abnormality given known history of RCA disease). - HR well controlled - Continue tele while admitted   A-fib with RVR, converted spontaneously back to sinus rhythm Resume home beta blocker, provoked RVR likely secondary to recent medication changes.   AKI on CKD 3B Baseline creatinine appears to be 1.7 with GFR of 37 Presented with creatinine of 2.23 with GFR of 31 Follow repeat labs   Paroxysmal A-fib on Eliquis Resume home Eliquis for CVA prevention   Prediabetes Blood sugars well controlled.   Physical debility PT OT to assess Fall precautions  DVT prophylaxis: Eliquis Code Status: Full Family Communication: None present  Status is: Obs  Dispo: The patient is from: Home              Anticipated d/c is to: Home              Anticipated d/c date is: 24h              Patient currently NOT medically stable for discharge  Consultants:  Cardiology  Procedures:  None  Antimicrobials:  None   Subjective: No acute issue/events overnight  Objective: Vitals:   05/16/22 0530 05/16/22 0600 05/16/22 0630 05/16/22 0700  BP: 117/78 116/87 119/76 118/87  Pulse: 82 71 66 80  Resp: 15 17 17 17   Temp:       TempSrc:      SpO2: 98% 99% 99% 96%   No intake or output data in the 24 hours ending 05/16/22 0814 There were no vitals filed for this visit.  Examination:  General exam: Appears calm and comfortable  Respiratory system: Clear to auscultation. Respiratory effort normal. Cardiovascular system: S1 & S2 heard, RRR. No JVD, murmurs, rubs, gallops or clicks. No pedal edema. Gastrointestinal system: Abdomen is nondistended, soft and nontender. No organomegaly or masses felt. Normal bowel sounds heard. Central nervous system: Alert and oriented. No focal neurological deficits. Extremities: Symmetric 5 x 5 power. Skin: No rashes, lesions or ulcers Psychiatry: Judgement and insight appear normal. Mood & affect appropriate.   Data Reviewed: I have personally reviewed following labs and imaging studies  CBC: Recent Labs  Lab 05/15/22 2008 05/16/22 0426  WBC 9.6 7.6  NEUTROABS 5.6  --   HGB 13.7 12.6*  HCT 43.1 38.7*  MCV 75.0* 74.6*  PLT 238 237   Basic Metabolic Panel: Recent Labs  Lab 05/11/22 1030 05/15/22 2008 05/16/22 0426  NA 137 136  --   K 3.5 3.5  --   CL 100 102  --   CO2 30 22  --   GLUCOSE 125* 136*  --   BUN 24* 26*  --   CREATININE 1.79* 2.23* 2.07*  CALCIUM 9.9 9.6  --  MG  --  1.8  --    GFR: Estimated Creatinine Clearance: 41.8 mL/min (A) (by C-G formula based on SCr of 2.07 mg/dL (H)). Liver Function Tests: Recent Labs  Lab 05/11/22 1030  AST 15  ALT 10  ALKPHOS 80  BILITOT 0.6  PROT 7.7  ALBUMIN 3.9   No results for input(s): "LIPASE", "AMYLASE" in the last 168 hours. No results for input(s): "AMMONIA" in the last 168 hours. Coagulation Profile: No results for input(s): "INR", "PROTIME" in the last 168 hours. Cardiac Enzymes: No results for input(s): "CKTOTAL", "CKMB", "CKMBINDEX", "TROPONINI" in the last 168 hours. BNP (last 3 results) No results for input(s): "PROBNP" in the last 8760 hours. HbA1C: No results for input(s): "HGBA1C"  in the last 72 hours. CBG: No results for input(s): "GLUCAP" in the last 168 hours. Lipid Profile: No results for input(s): "CHOL", "HDL", "LDLCALC", "TRIG", "CHOLHDL", "LDLDIRECT" in the last 72 hours. Thyroid Function Tests: No results for input(s): "TSH", "T4TOTAL", "FREET4", "T3FREE", "THYROIDAB" in the last 72 hours. Anemia Panel: No results for input(s): "VITAMINB12", "FOLATE", "FERRITIN", "TIBC", "IRON", "RETICCTPCT" in the last 72 hours. Sepsis Labs: No results for input(s): "PROCALCITON", "LATICACIDVEN" in the last 168 hours.  No results found for this or any previous visit (from the past 240 hour(s)).   Radiology Studies: No results found.  Scheduled Meds:  apixaban  5 mg Oral BID   metoprolol tartrate  12.5 mg Oral BID   rosuvastatin  5 mg Oral QHS    LOS: 0 days   Time spent: 13min  Tanesia Butner C Shakendra Griffeth, DO [facility]  If 7PM-7AM, please contact night-coverage [email or web address]  05/16/2022, 8:14 AM

## 2022-05-16 NOTE — ED Notes (Signed)
Pt ambulatory to restroom with steady gait, diet cola provided

## 2022-05-16 NOTE — Telephone Encounter (Signed)
Unable to contact patient as he is currently admitted since 9/25

## 2022-05-16 NOTE — Evaluation (Signed)
Occupational Therapy Evaluation/Discharge Patient Details Name: Roberto Rivera MRN: [id number] DOB: 1950-10-03 Today's Date: 05/16/2022   History of Present Illness Pt is a 71 y/o male who presented with SOB and heart palpitations. In ED, pt initially with a flutter but spontaneously returned to normal sinus rhythm. Troponins elevated though cardiology suspects demand ischemia. PMH: a fib, HTN, prediabetes, OA of spine   Clinical Impression   PTA, pt lives in level entry apartment with significant other. Pt reports Independent in all ADLs, IADLs and mobility though will get SOB if walking too far (> 1 block). Pt presents now Independent with ADLs/mobility without LOB or safety concerns. Minimal SOB with mobility around unit. Vitals stable pre and post activity. Discussed energy conservation strategies (in coordination with chronic back pain), self monitoring abnormal symptoms, and monitoring vitals (pt keeps a notebook). No further skilled OT services needed at acute level or on DC. Pt functionally ready for DC once deemed medically stable. Please reconsult if needs change.       Recommendations for follow up therapy are one component of a multi-disciplinary discharge planning process, led by the attending physician.  Recommendations may be updated based on patient status, additional functional criteria and insurance authorization.   Follow Up Recommendations  No OT follow up    Assistance Recommended at Discharge None  Patient can return home with the following      Functional Status Assessment  Patient has not had a recent decline in their functional status  Equipment Recommendations  None recommended by OT    Recommendations for Other Services       Precautions / Restrictions Precautions Precautions: None Restrictions Weight Bearing Restrictions: No      Mobility Bed Mobility Overal bed mobility: Modified Independent                  Transfers Overall transfer level:  Independent Equipment used: None                      Balance Overall balance assessment: Independent                                         ADL either performed or assessed with clinical judgement   ADL Overall ADL's : Independent                                       General ADL Comments: able to don shoes, mobilize around unit independently without LOB or safety concerns. no need for AD. Discussed energy conservation strategies with pt already implementing some at home (sitting to wash dishes at times though he reports d/t chronic back aches), self monitoring symptoms to implement rest breaks when needed, and monitoring vitals at home (pt has a notebook he keeps BP readings in)     Vision Baseline Vision/History: 1 Wears glasses Ability to See in Adequate Light: 0 Adequate Patient Visual Report: No change from baseline Vision Assessment?: No apparent visual deficits     Perception     Praxis      Pertinent Vitals/Pain Pain Assessment Pain Assessment: No/denies pain     Hand Dominance Right   Extremity/Trunk Assessment Upper Extremity Assessment Upper Extremity Assessment: Overall WFL for tasks assessed   Lower Extremity Assessment Lower Extremity Assessment: Overall WFL for tasks  assessed   Cervical / Trunk Assessment Cervical / Trunk Assessment: Normal   Communication Communication Communication: No difficulties   Cognition Arousal/Alertness: Awake/alert Behavior During Therapy: WFL for tasks assessed/performed Overall Cognitive Status: Within Functional Limits for tasks assessed                                       General Comments       Exercises     Shoulder Instructions      Home Living Family/patient expects to be discharged to:: Private residence Living Arrangements: Spouse/significant other Available Help at Discharge: Family;Available 24 hours/day Type of Home: Apartment Home  Access: Level entry     Home Layout: One level     Bathroom Shower/Tub: Teacher, early years/pre: Standard     Home Equipment: None          Prior Functioning/Environment Prior Level of Function : Independent/Modified Independent;Driving             Mobility Comments: no use of AD, does fatigue with long distance mobility but tries to walk around the block at his apartment occasionally ADLs Comments: Independent with ADLs, IADLs. enjoys Radio producer. uses shopping cart in grocery stores but manages well        OT Problem List: Cardiopulmonary status limiting activity      OT Treatment/Interventions:      OT Goals(Current goals can be found in the care plan section) Acute Rehab OT Goals Patient Stated Goal: feel back to 100% and go home soon OT Goal Formulation: All assessment and education complete, DC therapy  OT Frequency:      Co-evaluation              AM-PAC OT "6 Clicks" Daily Activity     Outcome Measure Help from another person eating meals?: None Help from another person taking care of personal grooming?: None Help from another person toileting, which includes using toliet, bedpan, or urinal?: None Help from another person bathing (including washing, rinsing, drying)?: None Help from another person to put on and taking off regular upper body clothing?: None Help from another person to put on and taking off regular lower body clothing?: None 6 Click Score: 24   End of Session Nurse Communication: Mobility status  Activity Tolerance: Patient tolerated treatment well Patient left: in bed;with call bell/phone within reach  OT Visit Diagnosis: Other (comment) (decreased cardiopulmonary tolerance)                Time: 5170-0174 OT Time Calculation (min): 19 min Charges:  OT General Charges $OT Visit: 1 Visit OT Evaluation $OT Eval Low Complexity: 1 Low  Malachy Chamber, OTR/L Acute Rehab Services Office: [phone]   Layla Maw 05/16/2022, 2:39 PM

## 2022-05-16 NOTE — Progress Notes (Signed)
  Echocardiogram 2D Echocardiogram has been performed.  Wynelle Link 05/16/2022, 12:03 PM

## 2022-05-16 NOTE — H&P (Addendum)
History and Physical  Roberto Rivera UJW:[id number] DOB: 04-05-1951 DOA: 05/15/2022  Referring physician: Dr. Jearld Fenton, EDP  PCP: Corwin Levins, MD  Outpatient Specialists: Cardiologist. Patient coming from: Home.  Chief Complaint: Shortness of breath and palpitations.  HPI: Roberto Rivera is a 71 y.o. male with medical history significant for paroxysmal A-fib on Eliquis, hypertension, obesity, prediabetes who presented to [facility] ED with complaints of shortness of breath associated with palpitations x2 days.  In the ED noted to be in a flutter 2:1.  The patient spontaneously converted to sinus rhythm.  High-sensitivity troponin noted to be elevated greater than 200.  The patient denies any chest pain.  EDP requested admission for observation due to elevated troponin.  The patient was admitted by [facility], hospitalist service.  ED Course: Tmax 98.2.  BP 116/75, pulse 83, respiration rate 17, O2 saturation 92% on room air.  Lab studies remarkable for BUN 26, creatinine 2.21 with baseline creatinine of 1.7.  High-sensitivity troponin 251, repeat 320.  Review of Systems: Review of systems as noted in the HPI. All other systems reviewed and are negative.   Past Medical History:  Diagnosis Date   ALLERGIC RHINITIS 04/01/2007   Diabetes mellitus    DIABETES MELLITUS, TYPE II 04/01/2007   ERECTILE DYSFUNCTION, ORGANIC 02/07/2008   Hypercholesteremia    HYPERCHOLESTEROLEMIA 02/07/2008   Hypertension    HYPERTENSION 04/01/2007   MICROCYTOSIS 02/07/2008   OSTEOARTHRITIS, SPINE 02/07/2008   PAF (paroxysmal atrial fibrillation) (HCC) 07/11/2020   Stroke (HCC)    THYROID CYST 02/07/2008   Past Surgical History:  Procedure Laterality Date   cyst removal  01/2021   back   LEFT HEART CATH AND CORONARY ANGIOGRAPHY N/A 09/28/2020   Procedure: LEFT HEART CATH AND CORONARY ANGIOGRAPHY;  Surgeon: Elder Negus, MD;  Location: [facility] INVASIVE CV LAB;  Service: Cardiovascular;  Laterality: N/A;    Social History:  reports  that he quit smoking about 22 months ago. His smoking use included cigarettes. He smoked an average of 1 pack per day. He has never used smokeless tobacco. He reports that he does not drink alcohol and does not use drugs.   No Known Allergies  Family History  Problem Relation Age of Onset   Cancer Mother        had uncertain type of cancer   Stroke Sister       Prior to Admission medications   Medication Sig Start Date End Date Taking? Authorizing Provider  Accu-Chek FastClix Lancets MISC USE TO CHECK BLOOD SUGAR AS DIRECTED ONCE DAILY 05/11/22   Corwin Levins, MD  albuterol (VENTOLIN HFA) 108 (90 Base) MCG/ACT inhaler Inhale 2 puffs into the lungs every 6 (six) hours as needed for wheezing or shortness of breath. 11/16/21   Corwin Levins, MD  ammonium lactate (AMLACTIN) 12 % cream  04/13/21   [provider]  apixaban (ELIQUIS) 5 MG TABS tablet Take 1 tablet (5 mg total) by mouth 2 (two) times daily. 10/14/21   Cantwell, Celeste C, PA-C  Candesartan Cilexetil-HCTZ 32-25 MG TABS Take 1 tablet by mouth daily. 11/16/21   Corwin Levins, MD  Cholecalciferol 50 MCG (2000 UT) TABS 1 tab by mouth once daily 11/20/21   Corwin Levins, MD  clotrimazole-betamethasone (LOTRISONE) cream Apply 1 application topically 2 (two) times daily. 10/14/21   Candelaria Stagers, DPM  diltiazem (CARDIZEM CD) 180 MG 24 hr capsule Take 1 capsule (180 mg total) by mouth every evening. 12/01/21   Cantwell, Renne Musca,  PA-C  glucose blood (ACCU-CHEK GUIDE) test strip 1 each by Other route daily. And lancets 1/day 06/15/21   Romero Belling, MD  hydrALAZINE (APRESOLINE) 100 MG tablet Take 1 tablet (100 mg total) by mouth 3 (three) times daily. 10/06/21   Cantwell, Celeste C, PA-C  isosorbide dinitrate (ISORDIL) 20 MG tablet Take 1 tablet (20 mg total) by mouth 3 (three) times daily. 10/06/21   Cantwell, Celeste C, PA-C  labetalol (NORMODYNE) 200 MG tablet Take 1 tablet (200 mg total) by mouth 2 (two) times daily. 05/10/22    Patwardhan, Anabel Bene, MD  nitroGLYCERIN (NITROSTAT) 0.4 MG SL tablet Place 1 tablet (0.4 mg total) under the tongue every 5 (five) minutes as needed for chest pain. 08/25/20 05/25/21  Cantwell, Celeste C, PA-C  potassium chloride (KLOR-CON 10) 10 MEQ tablet Take 1 tablet (10 mEq total) by mouth daily. 11/20/21   Corwin Levins, MD  repaglinide (PRANDIN) 1 MG tablet Take 1 tablet (1 mg total) by mouth daily with supper. 12/14/21   Romero Belling, MD  rosuvastatin (CRESTOR) 5 MG tablet Take 1 tablet (5 mg total) by mouth at bedtime. 1 tab by mouth once daily 11/16/21   Corwin Levins, MD    Physical Exam: BP 116/80   Pulse 73   Temp 98.2 F (36.8 C) (Oral)   Resp 15   SpO2 94%   General: 71 y.o. year-old male well developed well nourished in no acute distress.  Alert and oriented x3. Cardiovascular: Regular rate and rhythm with no rubs or gallops.  No thyromegaly or JVD noted.  No lower extremity edema. 2/4 pulses in all 4 extremities. Respiratory: Clear to auscultation with no wheezes or rales. Good inspiratory effort. Abdomen: Soft nontender nondistended with normal bowel sounds x4 quadrants. Muskuloskeletal: No cyanosis, clubbing or edema noted bilaterally Neuro: CN II-XII intact, strength, sensation, reflexes Skin: No ulcerative lesions noted or rashes Psychiatry: Judgement and insight appear normal. Mood is appropriate for condition and setting          Labs on Admission:  Basic Metabolic Panel: Recent Labs  Lab 05/11/22 1030 05/15/22 2008  NA 137 136  K 3.5 3.5  CL 100 102  CO2 30 22  GLUCOSE 125* 136*  BUN 24* 26*  CREATININE 1.79* 2.23*  CALCIUM 9.9 9.6  MG  --  1.8   Liver Function Tests: Recent Labs  Lab 05/11/22 1030  AST 15  ALT 10  ALKPHOS 80  BILITOT 0.6  PROT 7.7  ALBUMIN 3.9   No results for input(s): "LIPASE", "AMYLASE" in the last 168 hours. No results for input(s): "AMMONIA" in the last 168 hours. CBC: Recent Labs  Lab 05/15/22 2008  WBC 9.6   NEUTROABS 5.6  HGB 13.7  HCT 43.1  MCV 75.0*  PLT 238   Cardiac Enzymes: No results for input(s): "CKTOTAL", "CKMB", "CKMBINDEX", "TROPONINI" in the last 168 hours.  BNP (last 3 results) No results for input(s): "BNP" in the last 8760 hours.  ProBNP (last 3 results) No results for input(s): "PROBNP" in the last 8760 hours.  CBG: No results for input(s): "GLUCAP" in the last 168 hours.  Radiological Exams on Admission: No results found.  EKG: I independently viewed the EKG done and my findings are as followed: Sinus tachycardia rate of 100, paired PVCs, QTc 443.  A1c 5.9 on 05/11/22.  Assessment/Plan Present on Admission:  Elevated troponin  Principal Problem:   Elevated troponin  Elevated troponin in the setting of A-fib with RVR Initial  troponin 251, up trended to 320 Cycle troponin, serial EKG Follow 2D echo, LDL 56 on 05/11/2022. Closely monitor on telemetry  A-fib with RVR, converted spontaneously back to sinus rhythm Hold off labetalol, start Lopressor 12.5 mg twice daily.  AKI on CKD 3B Baseline creatinine appears to be 1.7 with GFR of 37 Presented with creatinine of 2.23 with GFR of 31 Avoid nephrotoxic agents, dehydration and hypotension Monitor urine output with strict I's and O's Repeat renal panel in the morning  Paroxysmal A-fib on Eliquis Resume home Eliquis for CVA prevention  Prediabetes Blood sugars well controlled.  Physical debility PT OT to assess Fall precautions    DVT prophylaxis: Eliquis  Code Status: Full code  Family Communication: Significant other at bedside  Disposition Plan: Admitted to telemetry cardiac unit  Consults called: Cardiology  Admission status: Observation status.   Status is: Observation    Kayleen Memos MD [facility] Pager [phone]  If 7PM-7AM, please contact night-coverage [email or web address] Password [facility]  05/16/2022, 3:53 AM

## 2022-05-16 NOTE — Progress Notes (Signed)
PT Cancellation Note  Patient Details Name: Roberto Rivera MRN: [id number] DOB: 12-19-1950   Cancelled Treatment:    Reason Eval/Treat Not Completed: PT screened, no needs identified, will sign off Per OT, no skilled PT needs. If needs change, please re-consult.   Lou Miner, DPT  Acute Rehabilitation Services  Office: [phone]    [name] 05/16/2022, 2:35 PM

## 2022-05-17 DIAGNOSIS — I480 Paroxysmal atrial fibrillation: Secondary | ICD-10-CM

## 2022-05-17 DIAGNOSIS — N179 Acute kidney failure, unspecified: Secondary | ICD-10-CM

## 2022-05-17 DIAGNOSIS — I10 Essential (primary) hypertension: Secondary | ICD-10-CM

## 2022-05-17 DIAGNOSIS — Z7901 Long term (current) use of anticoagulants: Secondary | ICD-10-CM

## 2022-05-17 DIAGNOSIS — I4891 Unspecified atrial fibrillation: Secondary | ICD-10-CM

## 2022-05-17 LAB — GLUCOSE, CAPILLARY: Glucose-Capillary: 98 mg/dL (ref 70–99)

## 2022-05-17 MED ORDER — METOPROLOL TARTRATE 50 MG PO TABS: 50.0000 mg | ORAL_TABLET | Freq: Two times a day (BID) | ORAL | 2 refills | Status: DC

## 2022-05-17 NOTE — TOC Transition Note (Signed)
Transition of Care [facility]) - CM/SW Discharge Note   Patient Details  Name: Roberto Rivera MRN: [id number] Date of Birth: 07/19/1951  [facility] CM/SW Contact:  Zenon Mayo, RN Phone Number: 05/17/2022, 11:18 AM   Clinical Narrative:    Patient is for dc today, he states his girlfriend will be transporting him home at dc.  He states he weighs himself but not daily, states he will do better with this.  Also he states he does check his bp daily, and he states he thinks he does ok with his diet, when his bp is low he does use a little salt but monitors how much he uses.  He does ne have any DME needs.           Patient Goals and CMS Choice        Discharge Placement                       Discharge Plan and Services                                     Social Determinants of Health (SDOH) Interventions     Readmission Risk Interventions     No data to display

## 2022-05-17 NOTE — Discharge Summary (Signed)
Physician Discharge Summary   Patient: Roberto Rivera MRN: [id number] DOB: 1951/03/13  Admit date:     05/15/2022  Discharge date: 05/17/22  Discharge Physician: Roberto Poche, MD   PCP: Roberto Borg, MD   Recommendations at discharge:  PCP follow-up BMP in 3-5 days to recheck creatinine Cardiology follow-up  Discharge Diagnoses: Principal Problem:   Elevated troponin Active Problems:   Benign hypertension   AKI (acute kidney injury) ([place])   Paroxysmal atrial fibrillation ([place])   Long term (current) use of anticoagulants   Atrial fibrillation with RVR ([place])  Resolved Problems:   * No resolved hospital problems. *  Hospital Course: Roberto Rivera is a 71 y.o. male with a history of atrial fibrillation on Eliquis, hypertension, obesity, prediabetes, CKD stage IIIb. Patient presented secondary to dyspnea and palpitations with evidence of atrial flutter/fibrillation. Patient managed with metoprolol and Cardizem with improvement of heart rates.  Assessment and Plan:  Paroxysmal atrial fibrillation/flutter with RVR Rapid ventricular response thought to be provoked by recent change to labetalol. Cardiology consulted. Patient managed with Cardizem and metoprolol with improvement of rates. Continue Eliquis. Conversion spontaneously back to sinus rhythm. Patient discharged on metoprolol  50 mg BID and diltiazem 180 mg daily  Demand ischemia Peak troponin of 348. Cardiology consulted. Low concern for ACS but rather attributed to atrial fibrillation. Transthoracic Echocardiogram performed and was significant for no regional wall motion abnormality.  AKI on CKD stage IIIb Baseline creatinine of 1.5. Creatinine of 1.79 on admission and up to a peak of 2.23. patient started on IV fluids with improvement to 2.07 prior to discharge. Repeat BMP as an outpatient.  Prediabetes Hemoglobin A1C of 5.9%. Recommendation for diet modification.   Consultants: Cardiology Procedures performed: Transthoracic  Echocardiogram  Disposition: Home Diet recommendation: Carb modified   DISCHARGE MEDICATION: Allergies as of 05/17/2022   No Known Allergies      Medication List     STOP taking these medications    Candesartan Cilexetil-HCTZ 32-25 MG Tabs   labetalol 200 MG tablet Commonly known as: NORMODYNE       TAKE these medications    Accu-Chek FastClix Lancets Misc USE TO CHECK BLOOD SUGAR AS DIRECTED ONCE DAILY   Accu-Chek Guide test strip Generic drug: glucose blood 1 each by Other route daily. And lancets 1/day   albuterol 108 (90 Base) MCG/ACT inhaler Commonly known as: VENTOLIN HFA Inhale 2 puffs into the lungs every 6 (six) hours as needed for wheezing or shortness of breath. What changed: when to take this   ammonium lactate 12 % cream Commonly known as: AMLACTIN Apply 1 Application topically as needed for dry skin.   apixaban 5 MG Tabs tablet Commonly known as: ELIQUIS Take 1 tablet (5 mg total) by mouth 2 (two) times daily.   Cholecalciferol 50 MCG (2000 UT) Tabs 1 tab by mouth once daily What changed:  how much to take how to take this when to take this additional instructions   clotrimazole-betamethasone cream Commonly known as: Lotrisone Apply 1 application topically 2 (two) times daily.   diltiazem 180 MG 24 hr capsule Commonly known as: Cardizem CD Take 1 capsule (180 mg total) by mouth every evening.   hydrALAZINE 100 MG tablet Commonly known as: APRESOLINE Take 1 tablet (100 mg total) by mouth 3 (three) times daily.   isosorbide dinitrate 20 MG tablet Commonly known as: ISORDIL Take 1 tablet (20 mg total) by mouth 3 (three) times daily.   metoprolol tartrate 50 MG tablet Commonly known  as: LOPRESSOR Take 1 tablet (50 mg total) by mouth 2 (two) times daily.   nitroGLYCERIN 0.4 MG SL tablet Commonly known as: NITROSTAT Place 1 tablet (0.4 mg total) under the tongue every 5 (five) minutes as needed for chest pain.   potassium chloride  10 MEQ tablet Commonly known as: [place]-Con 10 Take 1 tablet (10 mEq total) by mouth daily.   prednisoLONE acetate 1 % ophthalmic suspension Commonly known as: PRED FORTE Place 1 drop into both eyes 4 (four) times daily.   repaglinide 1 MG tablet Commonly known as: PRANDIN Take 1 tablet (1 mg total) by mouth daily with supper.   rosuvastatin 5 MG tablet Commonly known as: CRESTOR Take 1 tablet (5 mg total) by mouth at bedtime. 1 tab by mouth once daily        Discharge Exam: BP 114/60 (BP Location: Left Arm)   Pulse 60   Temp 98.4 F (36.9 C) (Oral)   Resp 16   Ht 5\' 11"  (1.803 m)   Wt 106.8 kg Comment: scale c  SpO2 96%   BMI 32.84 kg/m   General exam: Appears calm and comfortable Respiratory system: Clear to auscultation. Respiratory effort normal. Cardiovascular system: S1 & S2 heard, RRR. 2/6 systolic heart failure Gastrointestinal system: Abdomen is nondistended, soft and nontender. Normal bowel sounds heard. Central nervous system: Alert and oriented. No focal neurological deficits. Musculoskeletal: No edema. No calf tenderness Skin: No cyanosis. No rashes Psychiatry: Judgement and insight appear normal. Mood & affect appropriate.   Condition at discharge: stable  The results of significant diagnostics from this hospitalization (including imaging, microbiology, ancillary and laboratory) are listed below for reference.   Imaging Studies: ECHOCARDIOGRAM COMPLETE  Result Date: 05/16/2022    ECHOCARDIOGRAM REPORT   Patient Name:   Roberto Rivera  Date of Exam: 05/16/2022 Medical Rec #:  [id number]  Height:       71.0 in Accession #:    [id number] Weight:       242.0 lb Date of Birth:  01/19/1951  BSA:          2.286 m Patient Age:    21 years   BP:           118/81 mmHg Patient Gender: M          HR:           79 bpm. Exam Location:  Inpatient Procedure: 2D Echo, Cardiac Doppler and Color Doppler Indications:    Elevated troponin  History:        Patient has no prior history  of Echocardiogram examinations.                 CAD, Abnormal ECG, CVA, Arrythmias:Atrial Fibrillation,                 Signs/Symptoms:Dyspnea; Risk Factors:Diabetes, Dyslipidemia,                 Hypertension and Current Smoker.  Sonographer:    Greer Pickerel Referring Phys: [id number] Kayleen Memos  Sonographer Comments: Technically difficult study due to poor echo windows, suboptimal parasternal window and patient is obese. Image acquisition challenging due to respiratory motion. IMPRESSIONS  1. Left ventricular ejection fraction, by estimation, is 55 to 60%. The left ventricle has normal function. The left ventricle has no regional wall motion abnormalities. There is moderate left ventricular hypertrophy. Left ventricular diastolic parameters are consistent with Grade II diastolic dysfunction (pseudonormalization). Elevated left atrial pressure.  2. Right ventricular systolic function  is normal. The right ventricular size is normal. There is normal pulmonary artery systolic pressure.  3. Left atrial size was severely dilated.  4. The mitral valve is normal in structure. No evidence of mitral valve regurgitation. Mild mitral stenosis. The mean mitral valve gradient is 4.0 mmHg.  5. The aortic valve is tricuspid. Aortic valve regurgitation is mild to moderate. Mild aortic valve stenosis. Aortic valve area, by VTI measures 2.33 cm. Aortic valve mean gradient measures 12.0 mmHg. Aortic valve Vmax measures 2.37 m/s.  6. Aortic dilatation noted. Aneurysm of the ascending aorta, measuring 45 mm. Comparison(s): A prior study was performed on 07/01/2020. Changes from prior study are noted. Mild AS, mild MS new. LVH increased from mild to moderate. Diastolic dysfunction increased from I to II. FINDINGS  Left Ventricle: Left ventricular ejection fraction, by estimation, is 55 to 60%. The left ventricle has normal function. The left ventricle has no regional wall motion abnormalities. The left ventricular internal cavity size  was normal in size. There is  moderate left ventricular hypertrophy. Left ventricular diastolic parameters are consistent with Grade II diastolic dysfunction (pseudonormalization). Elevated left atrial pressure. Right Ventricle: The right ventricular size is normal. No increase in right ventricular wall thickness. Right ventricular systolic function is normal. There is normal pulmonary artery systolic pressure. The tricuspid regurgitant velocity is 1.49 m/s, and  with an assumed right atrial pressure of 3 mmHg, the estimated right ventricular systolic pressure is [id number] mmHg. Left Atrium: Left atrial size was severely dilated. Right Atrium: Right atrial size was normal in size. Pericardium: There is no evidence of pericardial effusion. Mitral Valve: The mitral valve is normal in structure. No evidence of mitral valve regurgitation. Mild mitral valve stenosis. MV peak gradient, 8.9 mmHg. The mean mitral valve gradient is 4.0 mmHg. Tricuspid Valve: The tricuspid valve is normal in structure. Tricuspid valve regurgitation is mild . No evidence of tricuspid stenosis. Aortic Valve: The aortic valve is tricuspid. Aortic valve regurgitation is mild to moderate. Aortic regurgitation PHT measures 592 msec. Mild aortic stenosis is present. Aortic valve mean gradient measures 12.0 mmHg. Aortic valve peak gradient measures 22.5 mmHg. Aortic valve area, by VTI measures 2.33 cm. Pulmonic Valve: The pulmonic valve was normal in structure. Pulmonic valve regurgitation is not visualized. No evidence of pulmonic stenosis. Aorta: Aortic dilatation noted. There is an aneurysm involving the ascending aorta measuring 45 mm. IAS/Shunts: The interatrial septum was not assessed.  LEFT VENTRICLE PLAX 2D LVIDd:         5.10 cm   Diastology LVIDs:         2.90 cm   LV e' medial:    3.16 cm/s LV PW:         1.50 cm   LV E/e' medial:  37.0 LV IVS:        1.50 cm   LV e' lateral:   5.70 cm/s LVOT diam:     1.90 cm   LV E/e' lateral: 20.5 LV SV:          87 LV SV Index:   38 LVOT Area:     2.84 cm                           3D Volume EF:                          3D EF:        51 %  LV EDV:       184 ml                          LV ESV:       90 ml                          LV SV:        94 ml RIGHT VENTRICLE RV S prime:     11.00 cm/s TAPSE (M-mode): 1.5 cm LEFT ATRIUM              Index        RIGHT ATRIUM           Index LA diam:        4.10 cm  1.79 cm/m   RA Area:     17.40 cm LA Vol (A2C):   106.0 ml 46.36 ml/m  RA Volume:   39.20 ml  17.14 ml/m LA Vol (A4C):   155.0 ml 67.79 ml/m LA Biplane Vol: 136.0 ml 59.48 ml/m  AORTIC VALVE AV Area (Vmax):    1.85 cm AV Area (Vmean):   1.82 cm AV Area (VTI):     2.33 cm AV Vmax:           237.00 cm/s AV Vmean:          165.000 cm/s AV VTI:            0.374 m AV Peak Grad:      22.5 mmHg AV Mean Grad:      12.0 mmHg LVOT Vmax:         155.00 cm/s LVOT Vmean:        106.000 cm/s LVOT VTI:          0.307 m LVOT/AV VTI ratio: 0.82 AI PHT:            592 msec  AORTA Ao Root diam: 4.00 cm Ao Asc diam:  4.50 cm MITRAL VALVE                TRICUSPID VALVE MV Area (PHT): 2.50 cm     TR Peak grad:   8.9 mmHg MV Area VTI:   1.99 cm     TR Vmax:        149.00 cm/s MV Peak grad:  8.9 mmHg MV Mean grad:  4.0 mmHg     SHUNTS MV Vmax:       1.49 m/s     Systemic VTI:  0.31 m MV Vmean:      99.1 cm/s    Systemic Diam: 1.90 cm MV Decel Time: 303 msec MV E velocity: 117.00 cm/s MV A velocity: 119.00 cm/s MV E/A ratio:  0.98 Manish Patwardhan MD Electronically signed by Vernell Leep MD Signature Date/Time: 05/16/2022/8:02:07 PM    Final     Microbiology: Results for orders placed or performed during the hospital encounter of 09/25/20  SARS CORONAVIRUS 2 (TAT 6-24 HRS) Nasopharyngeal Nasopharyngeal Swab     Status: None   Collection Time: 09/25/20  1:28 PM   Specimen: Nasopharyngeal Swab  Result Value Ref Range Status   SARS Coronavirus 2 NEGATIVE NEGATIVE Final    Comment: (NOTE) SARS-CoV-2  target nucleic acids are NOT DETECTED.  The SARS-CoV-2 RNA is generally detectable in upper and lower respiratory specimens during the acute phase of infection. Negative results do not preclude SARS-CoV-2 infection, do not rule out co-infections with other  pathogens, and should not be used as the sole basis for treatment or other patient management decisions. Negative results must be combined with clinical observations, patient history, and epidemiological information. The expected result is Negative.  Fact Sheet for Patients: SugarRoll.be  Fact Sheet for Healthcare Providers: https://www.woods-mathews.com/  This test is not yet approved or cleared by the [place] FDA and  has been authorized for detection and/or diagnosis of SARS-CoV-2 by FDA under an Emergency Use Authorization (EUA). This EUA will remain  in effect (meaning this test can be used) for the duration of the COVID-19 declaration under Se ction 564(b)(1) of the Act, 21 U.S.C. section 360bbb-3(b)(1), unless the authorization is terminated or revoked sooner.  Performed at [facility], [place] [address], [place], [place] [address]     Labs: CBC: Recent Labs  Lab 05/15/22 2008 05/16/22 0426  WBC 9.6 7.6  NEUTROABS 5.6  --   HGB 13.7 12.6*  HCT 43.1 38.7*  MCV 75.0* 74.6*  PLT 238 397   Basic Metabolic Panel: Recent Labs  Lab 05/11/22 1030 05/15/22 2008 05/16/22 0426  NA 137 136  --   K 3.5 3.5  --   CL 100 102  --   CO2 30 22  --   GLUCOSE 125* 136*  --   BUN 24* 26*  --   CREATININE 1.79* 2.23* 2.07*  CALCIUM 9.9 9.6  --   MG  --  1.8  --    Liver Function Tests: Recent Labs  Lab 05/11/22 1030  AST 15  ALT 10  ALKPHOS 80  BILITOT 0.6  PROT 7.7  ALBUMIN 3.9   CBG: Recent Labs  Lab 05/17/22 0610  GLUCAP 98    Discharge time spent: 35 minutes.  Signed: Cordelia Poche, MD [facility] 05/17/2022

## 2022-05-17 NOTE — Plan of Care (Signed)

## 2022-05-17 NOTE — Consult Note (Addendum)
[facility] CM Inpatient Consult   05/17/2022  Roberto Rivera [birth year] [id number]   [facility] Organization [ACO] Patient: [employer]   Primary Care Provider: [name], MD, [place] at [facility], is a provider with a Care Management team and program and is listed for the [facility] follow up needs    Patient screened for hospitalization with noted to assess for potential [place] Management service needs for post hospital transition for readmission prevention needs.  Met with patient at the bedside, reminded patient to follow up with PCP for Annual Wellness Visit and TOC appointments.  Gave patient an appointment reminder card and with a 24 hour nurse advise line magnet.  Patient expressed gratitude.   Plan:  No additional care coordination needs noted or verbalized. Patient verbalizes no complaints at this time.   For questions contact:    Natividad Brood, RN BSN [facility] Liaison  [phone] business mobile phone Toll free office [phone]  Fax number: [phone] [email or web address]

## 2022-05-17 NOTE — Progress Notes (Signed)
   05/17/22 1125  Mobility  Activity Ambulated independently in hallway  Level of Assistance Independent  Assistive Device None  Distance Ambulated (ft) 230 ft  Activity Response Tolerated well  $Mobility charge 1 Mobility   Mobility Specialist Progress Note  Received pt in bed having no complaints and agreeable to mobility. Pt was asymptomatic throughout ambulation and returned to room w/o fault. Left in EOB w/ call bell in reach and all needs met.   Lucious Groves Mobility Specialist

## 2022-05-17 NOTE — Discharge Instructions (Addendum)
Roberto Rivera,  You were in the hospital because of atrial fibrillation which is better controlled now. You also had some mild worsening of your kidney function which is improved with IV fluids. Your medications have been adjusted. Please discontinue your candesartan, hydrochlorothiazide, labetalol. Please follow-up with Dr. Virgina Jock.

## 2022-05-18 NOTE — ED Provider Notes (Signed)
Patient presents to urgent care for evaluation of diaphoresis, dizziness, and heart racing that started approximately 3 days ago. He was seen by his cardiologist 3 days ago where they changed his medications from metoprolol to labetalol. Patient believes this may have caused his symptoms. Denies current chest pain, but reports shortness of breath, dizziness, and feeling "feverish". He takes Eloquis daily for stroke prevention due to history of atrial fibrillation and denies recent falls or abdominal discomfort. He is neurologically intact to baseline per significant other who is at bedside. EKG performed in clinic shows ventricular heart rate of 157 beats per minute in SVT. Lung sounds clear, vital signs stable, and heart sounds consistent with SVT. Attempted vagal maneuvers in clinic without successful conversion.   Discussed physical exam and EKG findings with patient who agrees to go to the hospital for further evaluation via ambulance[facility]. IV placed by nursing staff, [facility] called, and patient transported to ED via [place].    Talbot Grumbling, [place] 05/18/22 1541

## 2022-05-19 ENCOUNTER — Telehealth: Payer: Self-pay | Admitting: *Deleted

## 2022-05-19 ENCOUNTER — Ambulatory Visit (INDEPENDENT_AMBULATORY_CARE_PROVIDER_SITE_OTHER): Payer: Medicare Other | Admitting: Internal Medicine

## 2022-05-19 ENCOUNTER — Encounter: Payer: Self-pay | Admitting: *Deleted

## 2022-05-19 VITALS — BP 118/66 | HR 53 | Temp 98.5°F | Ht 71.0 in | Wt 244.0 lb

## 2022-05-19 DIAGNOSIS — N1831 Chronic kidney disease, stage 3a: Secondary | ICD-10-CM

## 2022-05-19 DIAGNOSIS — E1169 Type 2 diabetes mellitus with other specified complication: Secondary | ICD-10-CM | POA: Diagnosis not present

## 2022-05-19 DIAGNOSIS — I4891 Unspecified atrial fibrillation: Secondary | ICD-10-CM | POA: Diagnosis not present

## 2022-05-19 DIAGNOSIS — E785 Hyperlipidemia, unspecified: Secondary | ICD-10-CM

## 2022-05-19 DIAGNOSIS — I4719 Other supraventricular tachycardia: Secondary | ICD-10-CM

## 2022-05-19 DIAGNOSIS — E1121 Type 2 diabetes mellitus with diabetic nephropathy: Secondary | ICD-10-CM

## 2022-05-19 NOTE — Patient Outreach (Signed)
  Care Coordination TOC Note Transition Care Management Follow-up Telephone Call Date of discharge and from where: Wednesday 05/17/22 Roberto Rivera; SVT/ A-Fib How have you been since you were released from the hospital? "I am back to normal; I saw Dr. Jenny Reichmann today and he agrees to stop the medicines that the hospital doctor told me stop; everything is going good and I am planning to see my heart doctor as scheduled on October 13.  I don't take the flu shot, but I will be careful and wear my mask and wash my hands frequently this winter.... I don't want to get sick if I can help it" Any questions or concerns? No  Items Reviewed: Did the pt receive and understand the discharge instructions provided? Yes  Medications obtained and verified? Yes  Other? No  Any new allergies since your discharge? No  Dietary orders reviewed? Yes Do you have support at home? Yes  girlfriend assisting as/ if indicated/ needed  Home Care and Equipment/Supplies: Were home health services ordered? no If so, what is the name of the agency? N/A  Has the agency set up a time to come to the patient's home? not applicable Were any new equipment or medical supplies ordered?  No What is the name of the medical supply agency? N/A Were you able to get the supplies/equipment? not applicable Do you have any questions related to the use of the equipment or supplies? No N/A  Functional Questionnaire: (I = Independent and D = Dependent) ADLs: I  girlfriend assisting as/ if indicated/ needed  Bathing/Dressing- I  Meal Prep- I  girlfriend assisting as/ if indicated/ needed  Eating- I  Maintaining continence- I  Transferring/Ambulation- I  Managing Meds- I  Follow up appointments reviewed:  PCP Hospital f/u appt confirmed? Yes  Scheduled to see Dr. Jenny Reichmann on today 05/19/22-- verified patient attended appointment this morning as scheduled; reviewed post-office visit instructions with patient who verbalizes excellent  understanding of same [facility] f/u appt confirmed? Yes  Scheduled to see cardiology provider Dr. Virgina Jock on Friday 06/02/22 @ 1:15 pm Are transportation arrangements needed? No  If their condition worsens, is the pt aware to call PCP or go to the Emergency Dept.? Yes Was the patient provided with contact information for the PCP's office or ED? Yes Was to pt encouraged to call back with questions or concerns? Yes  SDOH assessments and interventions completed:   Yes  Care Coordination Interventions Activated:  Yes   Care Coordination Interventions:  Reviewed today's post-hospital PCP office visit instructions; provided education around infection prevention for 2023-24 winter/ flu season, given patient does not get flu vaccines per his own preference    Encounter Outcome:  Pt. Visit Completed    Oneta Rack, RN, BSN, CCRN Alumnus RN CM Care Coordination/ Transition of [facility] Management [phone]: direct office

## 2022-05-19 NOTE — Progress Notes (Unsigned)
Patient ID: Roberto Rivera, male   DOB: 1951-02-27, 71 y.o.   MRN: [id number]        Chief Complaint: follow [facility] hosp[facility] sept 26 - 28 at [facility]       HPI:  Roberto Rivera is a 71 y.o. male here with c/o        Labetolol 200 bid with low BPs, so changed to toprol xl, and candesartanhct  stopped.   Has cardiology f/u oct 13  Wt Readings from Last 3 Encounters:  05/19/22 244 lb (110.7 kg)  05/17/22 235 lb 7.2 oz (106.8 kg)  05/11/22 242 lb (109.8 kg)   BP Readings from Last 3 Encounters:  05/19/22 118/66  05/17/22 114/60  05/15/22 129/65         Past Medical History:  Diagnosis Date   ALLERGIC RHINITIS 04/01/2007   Diabetes mellitus    DIABETES MELLITUS, TYPE II 04/01/2007   ERECTILE DYSFUNCTION, ORGANIC 02/07/2008   Hypercholesteremia    HYPERCHOLESTEROLEMIA 02/07/2008   Hypertension    HYPERTENSION 04/01/2007   MICROCYTOSIS 02/07/2008   OSTEOARTHRITIS, SPINE 02/07/2008   PAF (paroxysmal atrial fibrillation) (HCC) 07/11/2020   Stroke (HCC)    THYROID CYST 02/07/2008   Past Surgical History:  Procedure Laterality Date   cyst removal  01/2021   back   LEFT HEART CATH AND CORONARY ANGIOGRAPHY N/A 09/28/2020   Procedure: LEFT HEART CATH AND CORONARY ANGIOGRAPHY;  Surgeon: Elder Negus, MD;  Location: [facility];  Service: Cardiovascular;  Laterality: N/A;    reports that he quit smoking about 22 months ago. His smoking use included cigarettes. He smoked an average of 1 pack per day. He has never used smokeless tobacco. He reports that he does not drink alcohol and does not use drugs. family history includes Cancer in his mother; Stroke in his sister. No Known Allergies Current Outpatient Medications on File Prior to Visit  Medication Sig Dispense Refill   Accu-Chek FastClix Lancets MISC USE TO CHECK BLOOD SUGAR AS DIRECTED ONCE DAILY 102 each 3   albuterol (VENTOLIN HFA) 108 (90 Base) MCG/ACT inhaler Inhale 2 puffs into the lungs every 6 (six) hours as needed for wheezing or  shortness of breath. (Patient taking differently: Inhale 2 puffs into the lungs 2 (two) times daily as needed for wheezing or shortness of breath.) 24 g 3   ammonium lactate (AMLACTIN) 12 % cream Apply 1 Application topically as needed for dry skin.     apixaban (ELIQUIS) 5 MG TABS tablet Take 1 tablet (5 mg total) by mouth 2 (two) times daily. 180 tablet 3   Cholecalciferol 50 MCG (2000 UT) TABS 1 tab by mouth once daily (Patient taking differently: Take 2,000 Units by mouth daily.) 30 tablet 99   clotrimazole-betamethasone (LOTRISONE) cream Apply 1 application topically 2 (two) times daily. 30 g 0   diltiazem (CARDIZEM CD) 180 MG 24 hr capsule Take 1 capsule (180 mg total) by mouth every evening. 90 capsule 3   glucose blood (ACCU-CHEK GUIDE) test strip 1 each by Other route daily. And lancets 1/day 100 strip 3   hydrALAZINE (APRESOLINE) 100 MG tablet Take 1 tablet (100 mg total) by mouth 3 (three) times daily. 270 tablet 3   isosorbide dinitrate (ISORDIL) 20 MG tablet Take 1 tablet (20 mg total) by mouth 3 (three) times daily. 270 tablet 3   metoprolol tartrate (LOPRESSOR) 50 MG tablet Take 1 tablet (50 mg total) by mouth 2 (two) times daily. 60 tablet 2   potassium  chloride (KLOR-CON 10) 10 MEQ tablet Take 1 tablet (10 mEq total) by mouth daily. 90 tablet 3   prednisoLONE acetate (PRED FORTE) 1 % ophthalmic suspension Place 1 drop into both eyes 4 (four) times daily.     repaglinide (PRANDIN) 1 MG tablet Take 1 tablet (1 mg total) by mouth daily with supper. 90 tablet 1   rosuvastatin (CRESTOR) 5 MG tablet Take 1 tablet (5 mg total) by mouth at bedtime. 1 tab by mouth once daily 90 tablet 3   nitroGLYCERIN (NITROSTAT) 0.4 MG SL tablet Place 1 tablet (0.4 mg total) under the tongue every 5 (five) minutes as needed for chest pain. 90 tablet 3   No current facility-administered medications on file prior to visit.        ROS:  All others reviewed and negative.  Objective        PE:  BP 118/66  (BP Location: Left Arm, Patient Position: Sitting, Cuff Size: Large)   Pulse (!) 53   Temp 98.5 F (36.9 C) (Oral)   Ht 5\' 11"  (1.803 m)   Wt 244 lb (110.7 kg)   SpO2 95%   BMI 34.03 kg/m                 Constitutional: Pt appears in NAD               HENT: Head: NCAT.                Right Ear: External ear normal.                 Left Ear: External ear normal.                Eyes: . Pupils are equal, round, and reactive to light. Conjunctivae and EOM are normal               Nose: without d/c or deformity               Neck: Neck supple. Gross normal ROM               Cardiovascular: Normal rate and regular rhythm.                 Pulmonary/Chest: Effort normal and breath sounds without rales or wheezing.                Abd:  Soft, NT, ND, + BS, no organomegaly               Neurological: Pt is alert. At baseline orientation, motor grossly intact               Skin: Skin is warm. No rashes, no other new lesions, LE edema - ***               Psychiatric: Pt behavior is normal without agitation   Micro: none  Cardiac tracings I have personally interpreted today:  none  Pertinent Radiological findings (summarize): none   Lab Results  Component Value Date   WBC 7.6 05/16/2022   HGB 12.6 (L) 05/16/2022   HCT 38.7 (L) 05/16/2022   PLT 237 05/16/2022   GLUCOSE 136 (H) 05/15/2022   CHOL 109 05/11/2022   TRIG 59.0 05/11/2022   HDL 41.20 05/11/2022   LDLCALC 56 05/11/2022   ALT 10 05/11/2022   AST 15 05/11/2022   NA 136 05/15/2022   K 3.5 05/15/2022   CL 102 05/15/2022   CREATININE 2.07 (H) 05/16/2022  BUN 26 (H) 05/15/2022   CO2 22 05/15/2022   TSH 1.46 11/16/2021   PSA 2.41 11/16/2021   INR 1.0 06/30/2020   HGBA1C 5.9 05/11/2022   MICROALBUR 3.9 (H) 11/16/2021   Assessment/Plan:  Roberto Rivera is a 71 y.o. Black or African American [2] male with  has a past medical history of ALLERGIC RHINITIS (04/01/2007), Diabetes mellitus, DIABETES MELLITUS, TYPE II (04/01/2007),  ERECTILE DYSFUNCTION, ORGANIC (02/07/2008), Hypercholesteremia, HYPERCHOLESTEROLEMIA (02/07/2008), Hypertension, HYPERTENSION (04/01/2007), MICROCYTOSIS (02/07/2008), OSTEOARTHRITIS, SPINE (02/07/2008), PAF (paroxysmal atrial fibrillation) (HCC) (07/11/2020), Stroke (HCC), and THYROID CYST (02/07/2008).  No problem-specific Assessment & Plan notes found for this encounter.  Followup: No follow-ups on file.  Oliver Barre, MD 05/19/2022 8:41 AM [facility]

## 2022-05-19 NOTE — Patient Instructions (Signed)
Please continue all other medications as before, and refills have been done if requested.  Please have the pharmacy call with any other refills you may need.  Please continue your efforts at being more active, low cholesterol diet, and weight control.  Please keep your appointments with your specialists as you may have planned - cardiology on Oct 13  No further lab work needed today  Please make an Appointment to return in 6 months, or sooner if needed

## 2022-05-20 ENCOUNTER — Encounter: Payer: Self-pay | Admitting: Internal Medicine

## 2022-05-20 NOTE — Assessment & Plan Note (Signed)
Lab Results  Component Value Date   LDLCALC 56 05/11/2022   Stable, pt to continue current statin crestor 5 mg qd

## 2022-05-20 NOTE — Assessment & Plan Note (Signed)
Lab Results  Component Value Date   HGBA1C 5.9 05/11/2022   Stable, pt to continue current medical treatment prandin 1 mg qd

## 2022-05-20 NOTE — Assessment & Plan Note (Signed)
Lab Results  Component Value Date   CREATININE 2.07 (H) 05/16/2022   Stable overall, cont to avoid nephrotoxins

## 2022-05-20 NOTE — Assessment & Plan Note (Signed)
Now with stable rate and volume with change back to toprol and cardizem added; cont current tx

## 2022-05-24 ENCOUNTER — Ambulatory Visit: Payer: Medicare Other | Admitting: Internal Medicine

## 2022-06-02 ENCOUNTER — Ambulatory Visit: Payer: Medicare Other | Admitting: Cardiology

## 2022-06-02 ENCOUNTER — Encounter: Payer: Self-pay | Admitting: Cardiology

## 2022-06-02 VITALS — BP 151/79 | HR 60 | Temp 98.0°F | Resp 16 | Ht 71.0 in | Wt 243.0 lb

## 2022-06-02 DIAGNOSIS — I2510 Atherosclerotic heart disease of native coronary artery without angina pectoris: Secondary | ICD-10-CM

## 2022-06-02 DIAGNOSIS — I480 Paroxysmal atrial fibrillation: Secondary | ICD-10-CM | POA: Diagnosis not present

## 2022-06-02 DIAGNOSIS — I471 Supraventricular tachycardia, unspecified: Secondary | ICD-10-CM | POA: Diagnosis not present

## 2022-06-02 DIAGNOSIS — I10 Essential (primary) hypertension: Secondary | ICD-10-CM | POA: Diagnosis not present

## 2022-06-02 NOTE — Progress Notes (Signed)
Follow up visit  Subjective:   Roberto Rivera, male    DOB: [birth year], 71 y.o.   MRN: [id number]     HPI  Chief Complaint  Patient presents with   Coronary Artery Disease   Follow-up    2 week    71 y/o African American melr with hypertension, hyperlipidemia, CAD, PAF, PSVT, h/o stroke  Patient was hospitalized in 04/2022 with episode of PSVT, chest tightness, mild troponin elevation which attributed to PSVT and recommended medical management-in light of AKI.  Patient has not had recurrence of chest pain shortness of since then.    Current Outpatient Medications:    Accu-Chek FastClix Lancets MISC, USE TO CHECK BLOOD SUGAR AS DIRECTED ONCE DAILY, Disp: 102 each, Rfl: 3   albuterol (VENTOLIN HFA) 108 (90 Base) MCG/ACT inhaler, Inhale 2 puffs into the lungs every 6 (six) hours as needed for wheezing or shortness of breath. (Patient taking differently: Inhale 2 puffs into the lungs 2 (two) times daily as needed for wheezing or shortness of breath.), Disp: 24 g, Rfl: 3   ammonium lactate (AMLACTIN) 12 % cream, Apply 1 Application topically as needed for dry skin., Disp: , Rfl:    apixaban (ELIQUIS) 5 MG TABS tablet, Take 1 tablet (5 mg total) by mouth 2 (two) times daily., Disp: 180 tablet, Rfl: 3   Cholecalciferol 50 MCG (2000 UT) TABS, 1 tab by mouth once daily (Patient taking differently: Take 2,000 Units by mouth daily.), Disp: 30 tablet, Rfl: 99   clotrimazole-betamethasone (LOTRISONE) cream, Apply 1 application topically 2 (two) times daily., Disp: 30 g, Rfl: 0   diltiazem (CARDIZEM CD) 180 MG 24 hr capsule, Take 1 capsule (180 mg total) by mouth every evening., Disp: 90 capsule, Rfl: 3   glucose blood (ACCU-CHEK GUIDE) test strip, 1 each by Other route daily. And lancets 1/day, Disp: 100 strip, Rfl: 3   hydrALAZINE (APRESOLINE) 100 MG tablet, Take 1 tablet (100 mg total) by mouth 3 (three) times daily., Disp: 270 tablet, Rfl: 3   isosorbide dinitrate (ISORDIL) 20 MG tablet, Take 1  tablet (20 mg total) by mouth 3 (three) times daily., Disp: 270 tablet, Rfl: 3   metoprolol tartrate (LOPRESSOR) 50 MG tablet, Take 1 tablet (50 mg total) by mouth 2 (two) times daily., Disp: 60 tablet, Rfl: 2   nitroGLYCERIN (NITROSTAT) 0.4 MG SL tablet, Place 1 tablet (0.4 mg total) under the tongue every 5 (five) minutes as needed for chest pain., Disp: 90 tablet, Rfl: 3   potassium chloride (KLOR-CON 10) 10 MEQ tablet, Take 1 tablet (10 mEq total) by mouth daily., Disp: 90 tablet, Rfl: 3   prednisoLONE acetate (PRED FORTE) 1 % ophthalmic suspension, Place 1 drop into both eyes 4 (four) times daily., Disp: , Rfl:    repaglinide (PRANDIN) 1 MG tablet, Take 1 tablet (1 mg total) by mouth daily with supper., Disp: 90 tablet, Rfl: 1   rosuvastatin (CRESTOR) 5 MG tablet, Take 1 tablet (5 mg total) by mouth at bedtime. 1 tab by mouth once daily, Disp: 90 tablet, Rfl: 3   Cardiovascular & other pertient studies:  Reviewed external labs and tests, independently interpreted  EKG 05/10/2022: Sinus rhythm 57 bpm Normal EKG   Left heart cath and coronary angiography 09/28/2020:  LM: Large, normal LAD: Large, minimal luminal irregularities Ramus: Large, normal LCx: Large, normal RCA:  Prox calcific 50% stenosis, distal RCA subtotal occlusion with TIMI 1 flow  PCV MYOCARDIAL PERFUSION WO LEXISCAN 08/04/2020 Nondiagnostic ECG stress due to  pharmacologic stress. Resting EKG demonstrated atrial fibrillation with rapid ventricular response. Non-specific ST-T abnormality. Peak EKG/ demonstrated atrial fibrillation with rapid ventricular response. There is a reversible moderate defect in the lateral and inferior regions. consistent with severe ischemia. Overall LV systolic function is abnormal with regional wall motion abnormalities in the same area.  Stress LV EF: 34%. No previous exam available for comparison. High risk study.   Venous Bilateral Ultrasound 07/02/2020: RIGHT:  - There is no evidence of  deep vein thrombosis in the lower extremity.  - No cystic structure found in the popliteal fossa.  LEFT:  - There is no evidence of deep vein thrombosis in the lower extremity.  - No cystic structure found in the popliteal fossa.   ECHOCARDIOGRAM COMPLETE 07/01/2020 1. Left ventricular ejection fraction, by estimation, is 60 to 65%. The left ventricle has normal function. The left ventricle has no regional wall motion abnormalities. There is mild left ventricular hypertrophy. Left ventricular diastolic parameters are consistent with Grade I diastolic dysfunction (impaired relaxation).   2. Right ventricular systolic function is normal. The right ventricular size is normal. Tricuspid regurgitation signal is inadequate for assessing PA pressure.   3. Left atrial size was severely dilated.   4. The mitral valve is normal in structure. No evidence of mitral valve regurgitation. No evidence of mitral stenosis. There was chordal systolic anterior motion noted (not valvular).  5. The aortic valve is tricuspid. Aortic valve regurgitation is mild. Mild aortic valve stenosis. Aortic valve mean gradient measures 14.0 mmHg.   6. Aortic dilatation noted. There is mild dilatation of the ascending aorta, measuring 40 mm.  7. The inferior vena cava is normal in size with greater than 50% respiratory variability, suggesting right atrial pressure of 3 mmHg.   VAS US CAROTID 07/01/2020 Right Carotid: Velocities in the right ICA are consistent with a 1-39% stenosis.  Left Carotid: Velocities in the left ICA are consistent with a 1-39% stenosis.  Vertebrals:  Bilateral vertebral arteries demonstrate antegrade flow.  Subclavians: Normal flow hemodynamics were seen in bilateral subclavian arteries.   Recent labs: 05/16/2022: Glucose 136, BUN/Cr 26/2.23. EGFR 31. Na/K 136/3.5.  H/H 13/43. MCV 75. Platelets 238   Latest Reference Range & Units 05/16/22 04:26 05/16/22 07:42 05/16/22 12:27  Troponin I (High  Sensitivity) <18 ng/L 334 (HH) 348 (HH) 333 (HH)  (HH): Data is critically high  11/16/2021: Glucose 104, BUN/Cr 29/1.74. EGFR 39. Na/K 137/3.4. Rest of the CMP normal H/H 11/36. MCV 3. Platelets 206 HbA1C 5.9% Chol 108, TG 57, HDL 41, LDL 55 TSH 1.4 normal    Review of Systems  Cardiovascular:  Negative for chest pain, dyspnea on exertion, leg swelling, palpitations and syncope.         Vitals:   06/02/22 1252  BP: (!) 151/79  Pulse: 60  Resp: 16  Temp: 98 F (36.7 C)  SpO2: 96%     Body mass index is 33.89 kg/m. Filed Weights   06/02/22 1252  Weight: 243 lb (110.2 kg)     Objective:   Physical Exam Vitals and nursing note reviewed.  Constitutional:      General: He is not in acute distress. Neck:     Vascular: No JVD.  Cardiovascular:     Rate and Rhythm: Normal rate and regular rhythm.     Heart sounds: Normal heart sounds. No murmur heard. Pulmonary:     Effort: Pulmonary effort is normal.     Breath sounds: Normal breath sounds. No wheezing  or rales.  Musculoskeletal:     Right lower leg: No edema.     Left lower leg: No edema.             Visit diagnoses:   ICD-10-CM   1. Coronary artery disease involving native coronary artery of native heart without angina pectoris  I25.10 EKG 12-Lead    2. PAF (paroxysmal atrial fibrillation) (HCC)  I48.0     3. Essential hypertension  I10     4. PSVT (paroxysmal supraventricular tachycardia)  I47.10        Assessment & Recommendations:    71 y/o African American melr with hypertension, hyperlipidemia, CAD, PAF, PSVT, h/o stroke  PSVT, troponin elevation: Episode of PSVT, likely AVNRT occurred in the setting of switching from metoprolol to labetalol.  This is likely also secondary elevation of troponin.  In absence of any anginal symptoms and renal dysfunction, do not recommend ischemia work-up at this time.  Continue metoprolol tartrate 50 mg twice daily.  Hypertension: Suspect component of  whitecoat hypertension.  No new dyspnea today.   Subconjunctival hemorrhage present at last visit is completely resolved.  PAF: Currently in sinus rhythm. CHA2DS2-VASc score 5, 7.2%.  Continue Eliquis twice daily.  F/u in 3 months    Nigel Mormon, MD Pager: [phone] Office: [phone]

## 2022-06-23 ENCOUNTER — Ambulatory Visit: Payer: Medicare Other | Admitting: Cardiology

## 2022-07-21 ENCOUNTER — Telehealth: Payer: Self-pay | Admitting: Internal Medicine

## 2022-07-21 MED ORDER — ACCU-CHEK FASTCLIX LANCETS MISC: 3 refills | Status: DC

## 2022-07-21 MED ORDER — ACCU-CHEK GUIDE VI STRP: 1.0000 | ORAL_STRIP | Freq: Every day | 3 refills | Status: DC

## 2022-07-21 NOTE — Telephone Encounter (Signed)
Refill has been sent to [facility] 

## 2022-07-21 NOTE — Telephone Encounter (Signed)
Patient called and said he needs a refill on his diabetic strips, the Accu-Chek FastClix Lancets, he says he has two strips left.

## 2022-07-23 ENCOUNTER — Other Ambulatory Visit: Payer: Self-pay | Admitting: Internal Medicine

## 2022-08-30 ENCOUNTER — Other Ambulatory Visit: Payer: Medicare Other

## 2022-09-06 ENCOUNTER — Ambulatory Visit: Payer: Medicare Other | Admitting: Cardiology

## 2022-09-06 ENCOUNTER — Ambulatory Visit: Payer: Medicare Other

## 2022-09-06 DIAGNOSIS — R0989 Other specified symptoms and signs involving the circulatory and respiratory systems: Secondary | ICD-10-CM | POA: Diagnosis not present

## 2022-09-06 DIAGNOSIS — E7800 Pure hypercholesterolemia, unspecified: Secondary | ICD-10-CM

## 2022-09-11 ENCOUNTER — Telehealth: Payer: Self-pay | Admitting: Internal Medicine

## 2022-09-11 NOTE — Telephone Encounter (Signed)
MEDICATION: repaglinide (PRANDIN) 1 MG tablet   PHARMACY: [facility], [place] - [address] AT [facility]   Comments: Has 1 pill left  **Let patient know to contact pharmacy at the end of the day to make sure medication is ready. **  ** Please notify patient to allow 48-72 hours to process**  **Encourage patient to contact the pharmacy for refills or they can request refills through [facility]**

## 2022-09-12 ENCOUNTER — Other Ambulatory Visit: Payer: Self-pay

## 2022-09-12 MED ORDER — REPAGLINIDE 1 MG PO TABS: 1.0000 mg | ORAL_TABLET | Freq: Every day | ORAL | 1 refills | Status: DC

## 2022-09-12 NOTE — Telephone Encounter (Signed)
Rx request sent to pharmacy as previous prescriber has retired

## 2022-09-13 ENCOUNTER — Other Ambulatory Visit: Payer: Medicare Other

## 2022-09-15 ENCOUNTER — Ambulatory Visit: Payer: Medicare Other | Admitting: Cardiology

## 2022-09-15 ENCOUNTER — Other Ambulatory Visit: Payer: Self-pay

## 2022-09-15 ENCOUNTER — Encounter: Payer: Self-pay | Admitting: Cardiology

## 2022-09-15 ENCOUNTER — Telehealth: Payer: Self-pay

## 2022-09-15 VITALS — BP 133/74 | HR 68 | Resp 16 | Ht 71.0 in | Wt 239.0 lb

## 2022-09-15 DIAGNOSIS — I471 Supraventricular tachycardia, unspecified: Secondary | ICD-10-CM

## 2022-09-15 DIAGNOSIS — I480 Paroxysmal atrial fibrillation: Secondary | ICD-10-CM | POA: Diagnosis not present

## 2022-09-15 DIAGNOSIS — I2510 Atherosclerotic heart disease of native coronary artery without angina pectoris: Secondary | ICD-10-CM

## 2022-09-15 DIAGNOSIS — I10 Essential (primary) hypertension: Secondary | ICD-10-CM

## 2022-09-15 MED ORDER — APIXABAN 5 MG PO TABS: 5.0000 mg | ORAL_TABLET | Freq: Two times a day (BID) | ORAL | 3 refills | Status: DC

## 2022-09-15 MED ORDER — HYDRALAZINE HCL 100 MG PO TABS: 100.0000 mg | ORAL_TABLET | Freq: Three times a day (TID) | ORAL | 3 refills | Status: DC

## 2022-09-15 MED ORDER — ISOSORBIDE DINITRATE 20 MG PO TABS: 20.0000 mg | ORAL_TABLET | Freq: Three times a day (TID) | ORAL | 3 refills | Status: DC

## 2022-09-15 NOTE — Telephone Encounter (Signed)
faxed patient assistance for Eliquis on 09/15/2022.

## 2022-09-15 NOTE — Progress Notes (Signed)
Follow up visit  Subjective:   Roberto Rivera, male    DOB: 1950/11/30, 72 y.o.   MRN: [id number]     HPI  Chief Complaint  Patient presents with   Coronary Artery Disease   Hypertension   Follow-up    4 week    72 y/o African American melr with hypertension, hyperlipidemia, CAD, PAF, PSVT, h/o stroke  Patient was hospitalized in 04/2022 with episode of PSVT, chest tightness, mild troponin elevation which attributed to PSVT and recommended medical management-in light of AKI.  Patient has not had recurrence of chest pain since then. Exertional dyspnea is mild and unchanged.     Current Outpatient Medications:    Accu-Chek FastClix Lancets MISC, USE TO CHECK BLOOD SUGAR AS DIRECTED ONCE DAILY, Disp: 102 each, Rfl: 3   albuterol (VENTOLIN HFA) 108 (90 Base) MCG/ACT inhaler, Inhale 2 puffs into the lungs every 6 (six) hours as needed for wheezing or shortness of breath. (Patient taking differently: Inhale 2 puffs into the lungs 2 (two) times daily as needed for wheezing or shortness of breath.), Disp: 24 g, Rfl: 3   ammonium lactate (AMLACTIN) 12 % cream, Apply 1 Application topically as needed for dry skin., Disp: , Rfl:    apixaban (ELIQUIS) 5 MG TABS tablet, Take 1 tablet (5 mg total) by mouth 2 (two) times daily., Disp: 180 tablet, Rfl: 3   Cholecalciferol 50 MCG (2000 UT) TABS, 1 tab by mouth once daily (Patient taking differently: Take 2,000 Units by mouth daily.), Disp: 30 tablet, Rfl: 99   clotrimazole-betamethasone (LOTRISONE) cream, Apply 1 application topically 2 (two) times daily., Disp: 30 g, Rfl: 0   diltiazem (CARDIZEM CD) 180 MG 24 hr capsule, Take 1 capsule (180 mg total) by mouth every evening., Disp: 90 capsule, Rfl: 3   glucose blood (ACCU-CHEK GUIDE) test strip, 1 each by Other route daily. To check blood sugars once a day, Disp: 100 strip, Rfl: 3   hydrALAZINE (APRESOLINE) 100 MG tablet, Take 1 tablet (100 mg total) by mouth 3 (three) times daily., Disp: 270 tablet,  Rfl: 3   isosorbide dinitrate (ISORDIL) 20 MG tablet, Take 1 tablet (20 mg total) by mouth 3 (three) times daily., Disp: 270 tablet, Rfl: 3   metoprolol tartrate (LOPRESSOR) 50 MG tablet, Take 1 tablet (50 mg total) by mouth 2 (two) times daily., Disp: 60 tablet, Rfl: 2   nitroGLYCERIN (NITROSTAT) 0.4 MG SL tablet, Place 1 tablet (0.4 mg total) under the tongue every 5 (five) minutes as needed for chest pain., Disp: 90 tablet, Rfl: 3   potassium chloride (KLOR-CON 10) 10 MEQ tablet, Take 1 tablet (10 mEq total) by mouth daily., Disp: 90 tablet, Rfl: 3   prednisoLONE acetate (PRED FORTE) 1 % ophthalmic suspension, Place 1 drop into both eyes 4 (four) times daily., Disp: , Rfl:    repaglinide (PRANDIN) 1 MG tablet, Take 1 tablet (1 mg total) by mouth daily with supper., Disp: 90 tablet, Rfl: 1   rosuvastatin (CRESTOR) 5 MG tablet, Take 1 tablet (5 mg total) by mouth at bedtime. 1 tab by mouth once daily, Disp: 90 tablet, Rfl: 3   Cardiovascular & other pertient studies:  Reviewed external labs and tests, independently interpreted  EKG 05/10/2022: Sinus rhythm 57 bpm Normal EKG   Left heart cath and coronary angiography 09/28/2020:  LM: Large, normal LAD: Large, minimal luminal irregularities Ramus: Large, normal LCx: Large, normal RCA:  Prox calcific 50% stenosis, distal RCA subtotal occlusion with TIMI 1 flow  PCV MYOCARDIAL PERFUSION WO LEXISCAN 08/04/2020 Nondiagnostic ECG stress due to pharmacologic stress. Resting EKG demonstrated atrial fibrillation with rapid ventricular response. Non-specific ST-T abnormality. Peak EKG/ demonstrated atrial fibrillation with rapid ventricular response. There is a reversible moderate defect in the lateral and inferior regions. consistent with severe ischemia. Overall LV systolic function is abnormal with regional wall motion abnormalities in the same area.  Stress LV EF: 34%. No previous exam available for comparison. High risk study.   Venous  Bilateral Ultrasound 07/02/2020: RIGHT:  - There is no evidence of deep vein thrombosis in the lower extremity.  - No cystic structure found in the popliteal fossa.  LEFT:  - There is no evidence of deep vein thrombosis in the lower extremity.  - No cystic structure found in the popliteal fossa.   ECHOCARDIOGRAM COMPLETE 07/01/2020 1. Left ventricular ejection fraction, by estimation, is 60 to 65%. The left ventricle has normal function. The left ventricle has no regional wall motion abnormalities. There is mild left ventricular hypertrophy. Left ventricular diastolic parameters are consistent with Grade I diastolic dysfunction (impaired relaxation).   2. Right ventricular systolic function is normal. The right ventricular size is normal. Tricuspid regurgitation signal is inadequate for assessing PA pressure.   3. Left atrial size was severely dilated.   4. The mitral valve is normal in structure. No evidence of mitral valve regurgitation. No evidence of mitral stenosis. There was chordal systolic anterior motion noted (not valvular).  5. The aortic valve is tricuspid. Aortic valve regurgitation is mild. Mild aortic valve stenosis. Aortic valve mean gradient measures 14.0 mmHg.   6. Aortic dilatation noted. There is mild dilatation of the ascending aorta, measuring 40 mm.  7. The inferior vena cava is normal in size with greater than 50% respiratory variability, suggesting right atrial pressure of 3 mmHg.   VAS US CAROTID 07/01/2020 Right Carotid: Velocities in the right ICA are consistent with a 1-39% stenosis.  Left Carotid: Velocities in the left ICA are consistent with a 1-39% stenosis.  Vertebrals:  Bilateral vertebral arteries demonstrate antegrade flow.  Subclavians: Normal flow hemodynamics were seen in bilateral subclavian arteries.   Recent labs: 05/16/2022: Glucose 136, BUN/Cr 26/2.23. EGFR 31. Na/K 136/3.5.  H/H 13/43. MCV 75. Platelets 238   Latest Reference Range & Units  05/16/22 04:26 05/16/22 07:42 05/16/22 12:27  Troponin I (High Sensitivity) <18 ng/L 334 (HH) 348 (HH) 333 (HH)  (HH): Data is critically high  11/16/2021: Glucose 104, BUN/Cr 29/1.74. EGFR 39. Na/K 137/3.4. Rest of the CMP normal H/H 11/36. MCV 3. Platelets 206 HbA1C 5.9% Chol 108, TG 57, HDL 41, LDL 55 TSH 1.4 normal    Review of Systems  Cardiovascular:  Positive for dyspnea on exertion. Negative for chest pain, leg swelling, palpitations and syncope.         Vitals:   09/15/22 1339  BP: 133/74  Pulse: 68  Resp: 16  SpO2: 97%     Body mass index is 33.33 kg/m. Filed Weights   09/15/22 1339  Weight: 239 lb (108.4 kg)     Objective:   Physical Exam Vitals and nursing note reviewed.  Constitutional:      General: He is not in acute distress. Neck:     Vascular: No JVD.  Cardiovascular:     Rate and Rhythm: Normal rate and regular rhythm.     Heart sounds: Normal heart sounds. No murmur heard. Pulmonary:     Effort: Pulmonary effort is normal.     Breath  sounds: Normal breath sounds. No wheezing or rales.  Musculoskeletal:     Right lower leg: No edema.     Left lower leg: No edema.         Visit diagnoses:   ICD-10-CM   1. Coronary artery disease involving native coronary artery of native heart without angina pectoris  I25.10     2. PAF (paroxysmal atrial fibrillation) (HCC)  I48.0     3. Essential hypertension  I10 isosorbide dinitrate (ISORDIL) 20 MG tablet    hydrALAZINE (APRESOLINE) 100 MG tablet    4. PSVT (paroxysmal supraventricular tachycardia)  I47.10        Assessment & Recommendations:    72 y/o African American melr with hypertension, hyperlipidemia, CAD, PAF, PSVT, h/o stroke  PSVT: No clinical recurrence. Troponin elevation in 04/2023 likely occurred secondary to PSVT. Continue metoprolol tartrate 50 mg twice daily.  Hypertension: Home blood pressures are reportedly higher than fairly normal blood pressure enoted today.  Monitor home BP and compare home monitor with a medical office monitor. No change made today.  PAF: Currently in sinus rhythm. CHA2DS2-VASc score 5, 7.2%.  Continue Eliquis twice daily.  F/u in 6 months    Nigel Mormon, MD Pager: [phone] Office: [phone]

## 2022-09-26 ENCOUNTER — Telehealth: Payer: Self-pay

## 2022-09-26 NOTE — Telephone Encounter (Signed)
Left message for patient to call back to schedule Medicare Annual Wellness Visit   Last AWV  11/03/20  Please schedule at anytime with [facility] if patient calls the office back.    30 Minutes appointment   Any questions, please call me at [phone]

## 2022-10-06 ENCOUNTER — Encounter: Payer: Self-pay | Admitting: Internal Medicine

## 2022-10-06 ENCOUNTER — Ambulatory Visit (INDEPENDENT_AMBULATORY_CARE_PROVIDER_SITE_OTHER): Payer: Medicare Other | Admitting: Internal Medicine

## 2022-10-06 ENCOUNTER — Ambulatory Visit (INDEPENDENT_AMBULATORY_CARE_PROVIDER_SITE_OTHER): Payer: Medicare Other

## 2022-10-06 VITALS — BP 144/90 | HR 71 | Temp 98.5°F | Ht 71.0 in | Wt 235.0 lb

## 2022-10-06 DIAGNOSIS — E1121 Type 2 diabetes mellitus with diabetic nephropathy: Secondary | ICD-10-CM | POA: Diagnosis not present

## 2022-10-06 DIAGNOSIS — Z125 Encounter for screening for malignant neoplasm of prostate: Secondary | ICD-10-CM

## 2022-10-06 DIAGNOSIS — R059 Cough, unspecified: Secondary | ICD-10-CM

## 2022-10-06 DIAGNOSIS — E559 Vitamin D deficiency, unspecified: Secondary | ICD-10-CM

## 2022-10-06 DIAGNOSIS — I10 Essential (primary) hypertension: Secondary | ICD-10-CM

## 2022-10-06 DIAGNOSIS — N1831 Chronic kidney disease, stage 3a: Secondary | ICD-10-CM

## 2022-10-06 DIAGNOSIS — E538 Deficiency of other specified B group vitamins: Secondary | ICD-10-CM | POA: Diagnosis not present

## 2022-10-06 DIAGNOSIS — Z0001 Encounter for general adult medical examination with abnormal findings: Secondary | ICD-10-CM

## 2022-10-06 DIAGNOSIS — J811 Chronic pulmonary edema: Secondary | ICD-10-CM | POA: Diagnosis not present

## 2022-10-06 LAB — URINALYSIS, ROUTINE W REFLEX MICROSCOPIC
Bilirubin Urine: NEGATIVE
Hgb urine dipstick: NEGATIVE
Ketones, ur: NEGATIVE
Leukocytes,Ua: NEGATIVE
Nitrite: NEGATIVE
Specific Gravity, Urine: 1.02 (ref 1.000–1.030)
Total Protein, Urine: NEGATIVE
Urine Glucose: NEGATIVE
Urobilinogen, UA: 0.2 (ref 0.0–1.0)
pH: 5.5 (ref 5.0–8.0)

## 2022-10-06 LAB — CBC WITH DIFFERENTIAL/PLATELET
Basophils Absolute: 0 10*3/uL (ref 0.0–0.1)
Basophils Relative: 0.4 % (ref 0.0–3.0)
Eosinophils Absolute: 0.1 10*3/uL (ref 0.0–0.7)
Eosinophils Relative: 1.3 % (ref 0.0–5.0)
HCT: 40.3 % (ref 39.0–52.0)
Hemoglobin: 12.9 g/dL — ABNORMAL LOW (ref 13.0–17.0)
Lymphocytes Relative: 28.5 % (ref 12.0–46.0)
Lymphs Abs: 2.2 10*3/uL (ref 0.7–4.0)
MCHC: 32 g/dL (ref 30.0–36.0)
MCV: 73.6 fl — ABNORMAL LOW (ref 78.0–100.0)
Monocytes Absolute: 0.8 10*3/uL (ref 0.1–1.0)
Monocytes Relative: 10 % (ref 3.0–12.0)
Neutro Abs: 4.6 10*3/uL (ref 1.4–7.7)
Neutrophils Relative %: 59.8 % (ref 43.0–77.0)
Platelets: 247 10*3/uL (ref 150.0–400.0)
RBC: 5.48 Mil/uL (ref 4.22–5.81)
RDW: 19.6 % — ABNORMAL HIGH (ref 11.5–15.5)
WBC: 7.7 10*3/uL (ref 4.0–10.5)

## 2022-10-06 LAB — LIPID PANEL
Cholesterol: 109 mg/dL (ref 0–200)
HDL: 43.2 mg/dL (ref >39.00)
LDL Cholesterol: 54 mg/dL (ref 0–99)
NonHDL: 66.23
Total CHOL/HDL Ratio: 3
Triglycerides: 60 mg/dL (ref 0.0–149.0)
VLDL: 12 mg/dL (ref 0.0–40.0)

## 2022-10-06 LAB — BASIC METABOLIC PANEL
BUN: 23 mg/dL (ref 6–23)
CO2: 29 mEq/L (ref 19–32)
Calcium: 9.7 mg/dL (ref 8.4–10.5)
Chloride: 102 mEq/L (ref 96–112)
Creatinine, Ser: 1.6 mg/dL — ABNORMAL HIGH (ref 0.40–1.50)
GFR: 43.24 mL/min — ABNORMAL LOW (ref >60.00)
Glucose, Bld: 119 mg/dL — ABNORMAL HIGH (ref 70–99)
Potassium: 3.6 mEq/L (ref 3.5–5.1)
Sodium: 139 mEq/L (ref 135–145)

## 2022-10-06 LAB — PSA: PSA: 2.65 ng/mL (ref 0.10–4.00)

## 2022-10-06 LAB — VITAMIN D 25 HYDROXY (VIT D DEFICIENCY, FRACTURES): VITD: 61.36 ng/mL (ref 30.00–100.00)

## 2022-10-06 LAB — MICROALBUMIN / CREATININE URINE RATIO
Creatinine,U: 115.7 mg/dL
Microalb Creat Ratio: 9.2 mg/g (ref 0.0–30.0)
Microalb, Ur: 10.6 mg/dL — ABNORMAL HIGH (ref 0.0–1.9)

## 2022-10-06 LAB — HEPATIC FUNCTION PANEL
ALT: 15 U/L (ref 0–53)
AST: 15 U/L (ref 0–37)
Albumin: 4.1 g/dL (ref 3.5–5.2)
Alkaline Phosphatase: 86 U/L (ref 39–117)
Bilirubin, Direct: 0.2 mg/dL (ref 0.0–0.3)
Total Bilirubin: 0.6 mg/dL (ref 0.2–1.2)
Total Protein: 7.2 g/dL (ref 6.0–8.3)

## 2022-10-06 LAB — HEMOGLOBIN A1C: Hgb A1c MFr Bld: 5.4 % (ref 4.6–6.5)

## 2022-10-06 LAB — TSH: TSH: 1.2 u[IU]/mL (ref 0.35–5.50)

## 2022-10-06 LAB — VITAMIN B12: Vitamin B-12: 336 pg/mL (ref 211–911)

## 2022-10-06 MED ORDER — DAPAGLIFLOZIN PROPANEDIOL 10 MG PO TABS: 10.0000 mg | ORAL_TABLET | Freq: Every day | ORAL | 3 refills | Status: DC

## 2022-10-06 NOTE — Progress Notes (Unsigned)
Patient ID: Sethan Eddie, male   DOB: [birth year], 72 y.o.   MRN: [id number]         Chief Complaint:: wellness exam and 68mofollow up (Elevated bp/)  , ckd, dm, cough, low vit d       HPI:  RMilagro Trayeris a 72y.o. male here for wellness exam; declines tdap, covid boostser, flu shot, and colonoscopy, but for shingrix at the pharmacy o/w up to date                        Also Pt denies chest pain, increased sob or doe, wheezing, orthopnea, PND, increased LE swelling, palpitations, dizziness or syncope.   Pt denies polydipsia, polyuria, or new focal neuro s/s.    Pt denies fever, wt loss, night sweats, loss of appetite, or other constitutional symptoms  BP has been controlled at home per pt, has had several lbs wt loss.  Does have mild non prod cough but persistent for > 1 mo.     Wt Readings from Last 3 Encounters:  10/06/22 235 lb (106.6 kg)  09/15/22 239 lb (108.4 kg)  06/02/22 243 lb (110.2 kg)   BP Readings from Last 3 Encounters:  10/06/22 (!) 144/90  09/15/22 133/74  06/02/22 (!) 151/79   Immunization History  Administered Date(s) Administered   PFIZER(Purple Top)SARS-COV-2 Vaccination 04/23/2020, 05/14/2020   Pneumococcal Conjugate-13 12/25/2017   Pneumococcal Polysaccharide-23 02/04/2010, 01/28/2020   Td 01/29/2009   Zoster, Live 12/14/2011   Health Maintenance Due  Topic Date Due   DTaP/Tdap/Td (2 - Tdap) 01/30/2019      Past Medical History:  Diagnosis Date   ALLERGIC RHINITIS 04/01/2007   Diabetes mellitus    DIABETES MELLITUS, TYPE II 04/01/2007   ERECTILE DYSFUNCTION, ORGANIC 02/07/2008   Hypercholesteremia    HYPERCHOLESTEROLEMIA 02/07/2008   Hypertension    HYPERTENSION 04/01/2007   MICROCYTOSIS 02/07/2008   OSTEOARTHRITIS, SPINE 02/07/2008   PAF (paroxysmal atrial fibrillation) [place] 07/11/2020   Stroke [place]    THYROID CYST 02/07/2008   Past Surgical History:  Procedure Laterality Date   cyst removal  01/2021   back   LEFT HEART CATH AND CORONARY ANGIOGRAPHY  N/A 09/28/2020   Procedure: LEFT HEART CATH AND CORONARY ANGIOGRAPHY;  Surgeon: PNigel Mormon MD;  [facility];  Service: Cardiovascular;  Laterality: N/A;    reports that he quit smoking about 2 years ago. His smoking use included cigarettes. He smoked an average of 1 pack per day. He has never used smokeless tobacco. He reports that he does not drink alcohol and does not use drugs. family history includes Cancer in his mother; Stroke in his sister. No Known Allergies Current Outpatient Medications on File Prior to Visit  Medication Sig Dispense Refill   Accu-Chek FastClix Lancets MISC USE TO CHECK BLOOD SUGAR AS DIRECTED ONCE DAILY 102 each 3   albuterol (VENTOLIN HFA) 108 (90 Base) MCG/ACT inhaler Inhale 2 puffs into the lungs every 6 (six) hours as needed for wheezing or shortness of breath. (Patient taking differently: Inhale 2 puffs into the lungs 2 (two) times daily as needed for wheezing or shortness of breath.) 24 g 3   ammonium lactate (AMLACTIN) 12 % cream Apply 1 Application topically as needed for dry skin.     apixaban (ELIQUIS) 5 MG TABS tablet Take 1 tablet (5 mg total) by mouth 2 (two) times daily. 180 tablet 3   Cholecalciferol 50 MCG (2000 UT) TABS 1  tab by mouth once daily (Patient taking differently: Take 2,000 Units by mouth daily.) 30 tablet 99   clotrimazole-betamethasone (LOTRISONE) cream Apply 1 application topically 2 (two) times daily. 30 g 0   diltiazem (CARDIZEM CD) 180 MG 24 hr capsule Take 1 capsule (180 mg total) by mouth every evening. 90 capsule 3   glucose blood (ACCU-CHEK GUIDE) test strip 1 each by Other route daily. To check blood sugars once a day 100 strip 3   hydrALAZINE (APRESOLINE) 100 MG tablet Take 1 tablet (100 mg total) by mouth 3 (three) times daily. 270 tablet 3   isosorbide dinitrate (ISORDIL) 20 MG tablet Take 1 tablet (20 mg total) by mouth 3 (three) times daily. 270 tablet 3   potassium chloride (KLOR-CON 10) 10 MEQ  tablet Take 1 tablet (10 mEq total) by mouth daily. 90 tablet 3   rosuvastatin (CRESTOR) 5 MG tablet Take 1 tablet (5 mg total) by mouth at bedtime. 1 tab by mouth once daily 90 tablet 3   metoprolol tartrate (LOPRESSOR) 50 MG tablet Take 1 tablet (50 mg total) by mouth 2 (two) times daily. 60 tablet 2   nitroGLYCERIN (NITROSTAT) 0.4 MG SL tablet Place 1 tablet (0.4 mg total) under the tongue every 5 (five) minutes as needed for chest pain. 90 tablet 3   No current facility-administered medications on file prior to visit.        ROS:  All others reviewed and negative.  Objective        PE:  BP (!) 144/90 (BP Location: Right Arm, Patient Position: Sitting, Cuff Size: Large)   Pulse 71   Temp 98.5 F (36.9 C) (Oral)   Ht 5' 11"$  (1.803 m)   Wt 235 lb (106.6 kg)   SpO2 96%   BMI 32.78 kg/m                 Constitutional: Pt appears in NAD               HENT: Head: NCAT.                Right Ear: External ear normal.                 Left Ear: External ear normal.                Eyes: . Pupils are equal, round, and reactive to light. Conjunctivae and EOM are normal               Nose: without d/c or deformity               Neck: Neck supple. Gross normal ROM               Cardiovascular: Normal rate and regular rhythm.                 Pulmonary/Chest: Effort normal and breath sounds without rales or wheezing.                Abd:  Soft, NT, ND, + BS, no organomegaly               Neurological: Pt is alert. At baseline orientation, motor grossly intact               Skin: Skin is warm. No rashes, no other new lesions, LE edema - none               Psychiatric: Pt behavior is normal without agitation  Micro: none  Cardiac tracings I have personally interpreted today:  none  Pertinent Radiological findings (summarize): none   Lab Results  Component Value Date   WBC 7.7 10/06/2022   HGB 12.9 (L) 10/06/2022   HCT 40.3 10/06/2022   PLT 247.0 10/06/2022   GLUCOSE 119 (H) 10/06/2022    CHOL 109 10/06/2022   TRIG 60.0 10/06/2022   HDL 43.20 10/06/2022   LDLCALC 54 10/06/2022   ALT 15 10/06/2022   AST 15 10/06/2022   NA 139 10/06/2022   K 3.6 10/06/2022   CL 102 10/06/2022   CREATININE 1.60 (H) 10/06/2022   BUN 23 10/06/2022   CO2 29 10/06/2022   TSH 1.20 10/06/2022   PSA 2.65 10/06/2022   INR 1.0 06/30/2020   HGBA1C 5.4 10/06/2022   MICROALBUR 10.6 (H) 10/06/2022   Assessment/Plan:  Darryle Mahfouz is a 72 y.o. Black or African American [2] male with  has a past medical history of ALLERGIC RHINITIS (04/01/2007), Diabetes mellitus, DIABETES MELLITUS, TYPE II (04/01/2007), ERECTILE DYSFUNCTION, ORGANIC (02/07/2008), Hypercholesteremia, HYPERCHOLESTEROLEMIA (02/07/2008), Hypertension, HYPERTENSION (04/01/2007), MICROCYTOSIS (02/07/2008), OSTEOARTHRITIS, SPINE (02/07/2008), PAF (paroxysmal atrial fibrillation) ([place]) (07/11/2020), Stroke ([place]), and THYROID CYST (02/07/2008).  Encounter for well adult exam with abnormal findings Age and sex appropriate education and counseling updated with regular exercise and diet Referrals for preventative services - declines colonoscopy Immunizations addressed - for shingrix at pharmacy, declines flu shot, tdap Smoking counseling  - none needed Evidence for depression or other mood disorder - none significant Most recent labs reviewed. I have personally reviewed and have noted: 1) the patient's medical and social history 2) The patient's current medications and supplements 3) The patient's height, weight, and BMI have been recorded in the chart   CKD (chronic kidney disease) stage 3, GFR 30-59 ml/min (HCC) Lab Results  Component Value Date   CREATININE 1.60 (H) 10/06/2022   Stable overall, cont to avoid nephrotoxins  Diabetes ([place]) Lab Results  Component Value Date   HGBA1C 5.4 10/06/2022   Stable, but to change prandin to farxiga 5 mg qd in light of CKD   Vitamin D deficiency Last vitamin D Lab Results  Component Value Date    VD25OH 61.36 10/06/2022   Stable, cont oral replacement   Cough Etiology unclear, for cxr  Essential hypertension BP Readings from Last 3 Encounters:  10/06/22 (!) 144/90  09/15/22 133/74  06/02/22 (!) 151/79   Uncontrolled today, pt states controlled at home, pt to continue medical treatment card cd 180 qd, hydralazine 100 tid, lopressor 50 bid   Followup: Return in about 6 months (around 04/06/2023).  Cathlean Cower, MD 10/08/2022 6:55 AM [facility]

## 2022-10-06 NOTE — Patient Instructions (Signed)
Ok to stop the repaglinide (prandin)  Please take all new medication as prescribed - the farxiga 5 mg per day  Please continue all other medications as before, and refills have been done if requested.  Please have the pharmacy call with any other refills you may need.  Please continue your efforts at being more active, low cholesterol diet, and weight control.  You are otherwise up to date with prevention measures today.  Please keep your appointments with your specialists as you may have planned  Please go to the XRAY Department in the first floor for the x-ray testing  Please go to the LAB at the blood drawing area for the tests to be done  You will be contacted by phone if any changes need to be made immediately.  Otherwise, you will receive a letter about your results with an explanation, but please check with MyChart first.  Please remember to sign up for MyChart if you have not done so, as this will be important to you in the future with finding out test results, communicating by private email, and scheduling acute appointments online when needed.  Please make an Appointment to return in 6 months, or sooner if needed

## 2022-10-08 ENCOUNTER — Encounter: Payer: Self-pay | Admitting: Internal Medicine

## 2022-10-08 DIAGNOSIS — R059 Cough, unspecified: Secondary | ICD-10-CM | POA: Insufficient documentation

## 2022-10-08 NOTE — Assessment & Plan Note (Signed)
Last vitamin D Lab Results  Component Value Date   VD25OH 61.36 10/06/2022   Stable, cont oral replacement

## 2022-10-08 NOTE — Assessment & Plan Note (Signed)
Etiology unclear, for cxr

## 2022-10-08 NOTE — Assessment & Plan Note (Signed)
BP Readings from Last 3 Encounters:  10/06/22 (!) 144/90  09/15/22 133/74  06/02/22 (!) 151/79   Uncontrolled today, pt states controlled at home, pt to continue medical treatment card cd 180 qd, hydralazine 100 tid, lopressor 50 bid

## 2022-10-08 NOTE — Assessment & Plan Note (Signed)
Age and sex appropriate education and counseling updated with regular exercise and diet Referrals for preventative services - declines colonoscopy Immunizations addressed - for shingrix at pharmacy, declines flu shot, tdap Smoking counseling  - none needed Evidence for depression or other mood disorder - none significant Most recent labs reviewed. I have personally reviewed and have noted: 1) the patient's medical and social history 2) The patient's current medications and supplements 3) The patient's height, weight, and BMI have been recorded in the chart

## 2022-10-08 NOTE — Assessment & Plan Note (Signed)
Lab Results  Component Value Date   CREATININE 1.60 (H) 10/06/2022   Stable overall, cont to avoid nephrotoxins

## 2022-10-08 NOTE — Assessment & Plan Note (Signed)
Lab Results  Component Value Date   HGBA1C 5.4 10/06/2022   Stable, but to change prandin to farxiga 5 mg qd in light of CKD

## 2022-10-09 NOTE — Progress Notes (Signed)
Letter print and send

## 2022-10-10 ENCOUNTER — Encounter: Payer: Self-pay | Admitting: Internal Medicine

## 2022-10-10 ENCOUNTER — Telehealth: Payer: Self-pay | Admitting: Internal Medicine

## 2022-10-10 NOTE — Telephone Encounter (Signed)
Patient called and said that his diabetes medication that Dr. Jenny Reichmann prescribed cost to much .  The farxiga - he would like to be prescribed the other one that Dr. Jenny Reichmann had mentioned.  Patient said they discussed this previously.  Patient stated that  his blood pressure is still high.  He was supposed to let Dr. Jenny Reichmann know.

## 2022-10-10 NOTE — Telephone Encounter (Signed)
Please ask pt to return to office this wk if possible, as his cxr results need to be reviewed with him as well any med changes

## 2022-10-10 NOTE — Telephone Encounter (Signed)
Called and scheduled appt for this week

## 2022-10-12 ENCOUNTER — Ambulatory Visit (INDEPENDENT_AMBULATORY_CARE_PROVIDER_SITE_OTHER): Payer: Medicare Other

## 2022-10-12 ENCOUNTER — Ambulatory Visit (INDEPENDENT_AMBULATORY_CARE_PROVIDER_SITE_OTHER): Payer: Medicare Other | Admitting: Internal Medicine

## 2022-10-12 ENCOUNTER — Telehealth: Payer: Self-pay | Admitting: Internal Medicine

## 2022-10-12 ENCOUNTER — Encounter: Payer: Self-pay | Admitting: Internal Medicine

## 2022-10-12 VITALS — BP 136/82 | HR 72 | Temp 98.1°F | Ht 71.0 in | Wt 235.0 lb

## 2022-10-12 DIAGNOSIS — E1121 Type 2 diabetes mellitus with diabetic nephropathy: Secondary | ICD-10-CM | POA: Diagnosis not present

## 2022-10-12 DIAGNOSIS — I7121 Aneurysm of the ascending aorta, without rupture: Secondary | ICD-10-CM

## 2022-10-12 DIAGNOSIS — Z0389 Encounter for observation for other suspected diseases and conditions ruled out: Secondary | ICD-10-CM | POA: Diagnosis not present

## 2022-10-12 DIAGNOSIS — R911 Solitary pulmonary nodule: Secondary | ICD-10-CM

## 2022-10-12 DIAGNOSIS — M899 Disorder of bone, unspecified: Secondary | ICD-10-CM

## 2022-10-12 DIAGNOSIS — R0600 Dyspnea, unspecified: Secondary | ICD-10-CM

## 2022-10-12 MED ORDER — EMPAGLIFLOZIN 25 MG PO TABS: 25.0000 mg | ORAL_TABLET | Freq: Every day | ORAL | 3 refills | Status: DC

## 2022-10-12 MED ORDER — HYDROCHLOROTHIAZIDE 25 MG PO TABS: 25.0000 mg | ORAL_TABLET | Freq: Every day | ORAL | 3 refills | Status: DC

## 2022-10-12 NOTE — Telephone Encounter (Signed)
I received notice per [facility] -   The CT surgeons require a Chest CTA prior to scheduling a consult for the DX if TAA. Can your office set this up for  the patient and then I will get him scheduled. Thanks for your help   Please let pt know - I need to order a type of CT chest to check the aorta so the surgeons can tell him what they think about this at his visit

## 2022-10-12 NOTE — Progress Notes (Signed)
Patient ID: Roberto Rivera, male   DOB: 1951-04-18, 72 y.o.   MRN: [id number]        Chief Complaint: follow up HTN, HLD and DM, cough, mild MS and AS, aortic aneurysm, abnormal cxr, thoracic spine abnormality       HPI:  Roberto Rivera is a 72 y.o. male here overall doing ok but farxiga too expensive, unable to start.  Also had recent cough with mild DOE and cxr with increased pulm vascular.  Alos has hx of mild MS and AS, aortic aneurysm, and also abnormal area on CXR to a thoracic spine bone and needs repeat CXR with nipple markers.   Pt denies chest pain, wheezing, orthopnea, PND, increased LE swelling, palpitations, dizziness or syncope.   Pt denies polydipsia, polyuria, or new focal neuro s/s.    Pt denies fever, night sweats, loss of appetite, or other constitutional symptoms         Wt Readings from Last 3 Encounters:  10/12/22 235 lb (106.6 kg)  10/06/22 235 lb (106.6 kg)  09/15/22 239 lb (108.4 kg)   BP Readings from Last 3 Encounters:  10/12/22 136/82  10/06/22 (!) 144/90  09/15/22 133/74         Past Medical History:  Diagnosis Date   ALLERGIC RHINITIS 04/01/2007   Diabetes mellitus    DIABETES MELLITUS, TYPE II 04/01/2007   ERECTILE DYSFUNCTION, ORGANIC 02/07/2008   Hypercholesteremia    HYPERCHOLESTEROLEMIA 02/07/2008   Hypertension    HYPERTENSION 04/01/2007   MICROCYTOSIS 02/07/2008   OSTEOARTHRITIS, SPINE 02/07/2008   PAF (paroxysmal atrial fibrillation) ([place]) 07/11/2020   Stroke ([place])    THYROID CYST 02/07/2008   Past Surgical History:  Procedure Laterality Date   cyst removal  01/2021   back   LEFT HEART CATH AND CORONARY ANGIOGRAPHY N/A 09/28/2020   Procedure: LEFT HEART CATH AND CORONARY ANGIOGRAPHY;  Surgeon: Nigel Mormon, MD;  Location: [facility];  Service: Cardiovascular;  Laterality: N/A;    reports that he quit smoking about 2 years ago. His smoking use included cigarettes. He smoked an average of 1 pack per day. He has never used smokeless tobacco. He  reports that he does not drink alcohol and does not use drugs. family history includes Cancer in his mother; Stroke in his sister. No Known Allergies Current Outpatient Medications on File Prior to Visit  Medication Sig Dispense Refill   Accu-Chek FastClix Lancets MISC USE TO CHECK BLOOD SUGAR AS DIRECTED ONCE DAILY 102 each 3   albuterol (VENTOLIN HFA) 108 (90 Base) MCG/ACT inhaler Inhale 2 puffs into the lungs every 6 (six) hours as needed for wheezing or shortness of breath. (Patient taking differently: Inhale 2 puffs into the lungs 2 (two) times daily as needed for wheezing or shortness of breath.) 24 g 3   ammonium lactate (AMLACTIN) 12 % cream Apply 1 Application topically as needed for dry skin.     apixaban (ELIQUIS) 5 MG TABS tablet Take 1 tablet (5 mg total) by mouth 2 (two) times daily. 180 tablet 3   Cholecalciferol 50 MCG (2000 UT) TABS 1 tab by mouth once daily (Patient taking differently: Take 2,000 Units by mouth daily.) 30 tablet 99   clotrimazole-betamethasone (LOTRISONE) cream Apply 1 application topically 2 (two) times daily. 30 g 0   diltiazem (CARDIZEM CD) 180 MG 24 hr capsule Take 1 capsule (180 mg total) by mouth every evening. 90 capsule 3   glucose blood (ACCU-CHEK GUIDE) test strip 1 each by Other  route daily. To check blood sugars once a day 100 strip 3   hydrALAZINE (APRESOLINE) 100 MG tablet Take 1 tablet (100 mg total) by mouth 3 (three) times daily. 270 tablet 3   isosorbide dinitrate (ISORDIL) 20 MG tablet Take 1 tablet (20 mg total) by mouth 3 (three) times daily. 270 tablet 3   potassium chloride (KLOR-CON 10) 10 MEQ tablet Take 1 tablet (10 mEq total) by mouth daily. 90 tablet 3   rosuvastatin (CRESTOR) 5 MG tablet Take 1 tablet (5 mg total) by mouth at bedtime. 1 tab by mouth once daily 90 tablet 3   metoprolol tartrate (LOPRESSOR) 50 MG tablet Take 1 tablet (50 mg total) by mouth 2 (two) times daily. 60 tablet 2   nitroGLYCERIN (NITROSTAT) 0.4 MG SL tablet  Place 1 tablet (0.4 mg total) under the tongue every 5 (five) minutes as needed for chest pain. 90 tablet 3   No current facility-administered medications on file prior to visit.        ROS:  All others reviewed and negative.  Objective        PE:  BP 136/82 (BP Location: Left Arm, Patient Position: Sitting, Cuff Size: Large)   Pulse 72   Temp 98.1 F (36.7 C) (Oral)   Ht '5\' 11"'$  (1.803 m)   Wt 235 lb (106.6 kg)   SpO2 94%   BMI 32.78 kg/m                 Constitutional: Pt appears in NAD               HENT: Head: NCAT.                Right Ear: External ear normal.                 Left Ear: External ear normal.                Eyes: . Pupils are equal, round, and reactive to light. Conjunctivae and EOM are normal               Nose: without d/c or deformity               Neck: Neck supple. Gross normal ROM               Cardiovascular: Normal rate and regular rhythm.                 Pulmonary/Chest: Effort normal and breath sounds without rales or wheezing.                Abd:  Soft, NT, ND, + BS, no organomegaly               Neurological: Pt is alert. At baseline orientation, motor grossly intact               Skin: Skin is warm. No rashes, no other new lesions, LE edema - none               Psychiatric: Pt behavior is normal without agitation   Micro: none  Cardiac tracings I have personally interpreted today:  none  Pertinent Radiological findings (summarize): none   Lab Results  Component Value Date   WBC 7.7 10/06/2022   HGB 12.9 (L) 10/06/2022   HCT 40.3 10/06/2022   PLT 247.0 10/06/2022   GLUCOSE 119 (H) 10/06/2022   CHOL 109 10/06/2022   TRIG 60.0 10/06/2022   HDL 43.20  10/06/2022   LDLCALC 54 10/06/2022   ALT 15 10/06/2022   AST 15 10/06/2022   NA 139 10/06/2022   K 3.6 10/06/2022   CL 102 10/06/2022   CREATININE 1.60 (H) 10/06/2022   BUN 23 10/06/2022   CO2 29 10/06/2022   TSH 1.20 10/06/2022   PSA 2.65 10/06/2022   INR 1.0 06/30/2020   HGBA1C 5.4  10/06/2022   MICROALBUR 10.6 (H) 10/06/2022   Assessment/Plan:  Roberto Rivera is a 72 y.o. Black or African American [2] male with  has a past medical history of ALLERGIC RHINITIS (04/01/2007), Diabetes mellitus, DIABETES MELLITUS, TYPE II (04/01/2007), ERECTILE DYSFUNCTION, ORGANIC (02/07/2008), Hypercholesteremia, HYPERCHOLESTEROLEMIA (02/07/2008), Hypertension, HYPERTENSION (04/01/2007), MICROCYTOSIS (02/07/2008), OSTEOARTHRITIS, SPINE (02/07/2008), PAF (paroxysmal atrial fibrillation) ([place]) (07/11/2020), Stroke ([place]), and THYROID CYST (02/07/2008).  Pulmonary nodule Ok for repeat Cxr with nipple markders  Lesion of bone of thoracic spine Etiology unclear, for Thoracic MRI  Aneurysm of ascending aorta without rupture ([facility]) Pt also for CTA aortic and refer Chest surgeon  Diabetes [facility]) Ok for change of farxiga to jardiance 25 mg qd if ok with insurance  Dyspnea Likely very early pulm edema - for add HCT 25 mg qd,  to f/u any worsening symptoms or concerns and ROV in 3 wks  Followup: Return in about 3 weeks (around 11/02/2022).  Cathlean Cower, MD 10/18/2022 1:10 PM [facility]

## 2022-10-12 NOTE — Patient Instructions (Signed)
Ok to change the farxiga to Jardiance 25 mg per day if ok with insurance  Please take all new medication as prescribed  - the HCT 25 mg per day  Please continue all other medications as before, and refills have been done if requested.  Please have the pharmacy call with any other refills you may need.  Please continue your efforts at being more active, low cholesterol diet, and weight control.  You are otherwise up to date with prevention measures today.  Please keep your appointments with your specialists as you may have planned  You will be contacted regarding the referral for: MRI for the thoracic spine spot seen on the xray, and Chest Surgeon to follow the aortic aneurysm  Please go to the XRAY Department in the first floor for the x-ray testing - the repeat with Nipple markers  You will be contacted by phone if any changes need to be made immediately.  Otherwise, you will receive a letter about your results with an explanation, but please check with MyChart first.  Please remember to sign up for MyChart if you have not done so, as this will be important to you in the future with finding out test results, communicating by private email, and scheduling acute appointments online when needed.  Please make an Appointment to return in 3 weeks, or sooner if needed

## 2022-10-13 NOTE — Telephone Encounter (Signed)
Patient informed of order for CT

## 2022-10-16 ENCOUNTER — Encounter: Payer: Self-pay | Admitting: Internal Medicine

## 2022-10-18 ENCOUNTER — Ambulatory Visit (INDEPENDENT_AMBULATORY_CARE_PROVIDER_SITE_OTHER): Payer: Medicare Other | Admitting: Podiatry

## 2022-10-18 ENCOUNTER — Encounter: Payer: Self-pay | Admitting: Internal Medicine

## 2022-10-18 DIAGNOSIS — M79675 Pain in left toe(s): Secondary | ICD-10-CM

## 2022-10-18 DIAGNOSIS — B351 Tinea unguium: Secondary | ICD-10-CM | POA: Diagnosis not present

## 2022-10-18 DIAGNOSIS — M899 Disorder of bone, unspecified: Secondary | ICD-10-CM | POA: Insufficient documentation

## 2022-10-18 DIAGNOSIS — L853 Xerosis cutis: Secondary | ICD-10-CM | POA: Diagnosis not present

## 2022-10-18 DIAGNOSIS — D689 Coagulation defect, unspecified: Secondary | ICD-10-CM

## 2022-10-18 DIAGNOSIS — R911 Solitary pulmonary nodule: Secondary | ICD-10-CM | POA: Insufficient documentation

## 2022-10-18 DIAGNOSIS — I7121 Aneurysm of the ascending aorta, without rupture: Secondary | ICD-10-CM | POA: Insufficient documentation

## 2022-10-18 DIAGNOSIS — M79674 Pain in right toe(s): Secondary | ICD-10-CM | POA: Diagnosis not present

## 2022-10-18 MED ORDER — AMMONIUM LACTATE 12 % EX LOTN: 1.0000 | TOPICAL_LOTION | CUTANEOUS | 0 refills | Status: AC | PRN

## 2022-10-18 NOTE — Assessment & Plan Note (Addendum)
Likely very early pulm edema - for add HCT 25 mg qd,  to f/u any worsening symptoms or concerns and ROV in 3 wks

## 2022-10-18 NOTE — Progress Notes (Signed)
  Subjective:  Patient ID: Roberto Rivera, male    DOB: April 29, 1951,  MRN: [id number]  Chief Complaint  Patient presents with   Nail Problem    Nail trim    72 y.o. male returns for the above complaint.  Patient presents with thickened elongated dystrophic toenails x10.  Patient is on blood thinner.  He would like to have the nails debrided down as he is not able to do it himself.  He states is mildly painful to touch.   Objective:  There were no vitals filed for this visit. Podiatric Exam: Vascular: dorsalis pedis and posterior tibial pulses are palpable bilateral. Capillary return is immediate. Temperature gradient is WNL. Skin turgor WNL  Sensorium: Normal Semmes Weinstein monofilament test. Normal tactile sensation bilaterally. Nail Exam: Pt has thick disfigured discolored nails with subungual debris noted bilateral entire nail hallux through fifth toenails.  Pain on palpation to the nails. Ulcer Exam: There is no evidence of ulcer or pre-ulcerative changes or infection. Orthopedic Exam: Muscle tone and strength are WNL. No limitations in general ROM. No crepitus or effusions noted. HAV  B/L.  Hammer toes 2-5  B/L. Skin: No Porokeratosis. No infection or ulcers.  Bilateral athlete's foot noted with subjective component of itching as well as epidermal lysis.    Assessment & Plan:   1. Pain due to onychomycosis of toenails of both feet   2. Coagulation defect [facility])        Patient was evaluated and treated and all questions answered.  Bilateral xerosis -Patient will benefit from ammonium lactate ammonium lactate was sent to the pharmacy have asked her to apply twice a day. -   Onychomycosis with pain  -Nails palliatively debrided as below. -Educated on self-care  Procedure: Nail Debridement Rationale: pain  Type of Debridement: manual, sharp debridement. Instrumentation: Nail nipper, rotary burr. Number of Nails: 10  Procedures and Treatment: Consent by patient was obtained  for treatment procedures. The patient understood the discussion of treatment and procedures well. All questions were answered thoroughly reviewed. Debridement of mycotic and hypertrophic toenails, 1 through 5 bilateral and clearing of subungual debris. No ulceration, no infection noted.  Return Visit-Office Procedure: Patient instructed to return to the office for a follow up visit 3 months for continued evaluation and treatment.  Boneta Lucks, DPM    No follow-ups on file.

## 2022-10-18 NOTE — Assessment & Plan Note (Signed)
[place] for repeat Cxr with nipple markders

## 2022-10-18 NOTE — Assessment & Plan Note (Signed)
Etiology unclear, for Thoracic MRI

## 2022-10-18 NOTE — Assessment & Plan Note (Signed)
Pt also for CTA aortic and refer Chest surgeon

## 2022-10-18 NOTE — Assessment & Plan Note (Signed)
Ok for change of farxiga to jardiance 25 mg qd if ok with insurance

## 2022-10-24 ENCOUNTER — Ambulatory Visit
Admission: RE | Admit: 2022-10-24 | Discharge: 2022-10-24 | Disposition: A | Discharge status: Home / Self Care | Payer: Medicare Other | Source: Ambulatory Visit | Attending: Internal Medicine | Admitting: Internal Medicine

## 2022-10-24 ENCOUNTER — Other Ambulatory Visit: Payer: Medicare Other

## 2022-10-24 DIAGNOSIS — M5134 Other intervertebral disc degeneration, thoracic region: Secondary | ICD-10-CM | POA: Diagnosis not present

## 2022-10-24 DIAGNOSIS — M5124 Other intervertebral disc displacement, thoracic region: Secondary | ICD-10-CM | POA: Diagnosis not present

## 2022-10-24 DIAGNOSIS — M899 Disorder of bone, unspecified: Secondary | ICD-10-CM

## 2022-10-24 DIAGNOSIS — M4804 Spinal stenosis, thoracic region: Secondary | ICD-10-CM | POA: Diagnosis not present

## 2022-10-24 DIAGNOSIS — M2578 Osteophyte, vertebrae: Secondary | ICD-10-CM | POA: Diagnosis not present

## 2022-10-25 ENCOUNTER — Encounter: Payer: Self-pay | Admitting: Internal Medicine

## 2022-11-02 ENCOUNTER — Ambulatory Visit
Admission: RE | Admit: 2022-11-02 | Discharge: 2022-11-02 | Disposition: A | Discharge status: Home / Self Care | Payer: Medicare Other | Source: Ambulatory Visit | Attending: Internal Medicine | Admitting: Internal Medicine

## 2022-11-02 ENCOUNTER — Ambulatory Visit (INDEPENDENT_AMBULATORY_CARE_PROVIDER_SITE_OTHER): Payer: Medicare Other | Admitting: Internal Medicine

## 2022-11-02 ENCOUNTER — Encounter: Payer: Self-pay | Admitting: Internal Medicine

## 2022-11-02 VITALS — BP 136/82 | HR 66 | Temp 97.8°F | Ht 71.0 in | Wt 236.0 lb

## 2022-11-02 DIAGNOSIS — I359 Nonrheumatic aortic valve disorder, unspecified: Secondary | ICD-10-CM | POA: Diagnosis not present

## 2022-11-02 DIAGNOSIS — I2510 Atherosclerotic heart disease of native coronary artery without angina pectoris: Secondary | ICD-10-CM | POA: Diagnosis not present

## 2022-11-02 DIAGNOSIS — Z0000 Encounter for general adult medical examination without abnormal findings: Secondary | ICD-10-CM | POA: Diagnosis not present

## 2022-11-02 DIAGNOSIS — E1169 Type 2 diabetes mellitus with other specified complication: Secondary | ICD-10-CM | POA: Diagnosis not present

## 2022-11-02 DIAGNOSIS — E785 Hyperlipidemia, unspecified: Secondary | ICD-10-CM | POA: Diagnosis not present

## 2022-11-02 DIAGNOSIS — J9811 Atelectasis: Secondary | ICD-10-CM | POA: Diagnosis not present

## 2022-11-02 DIAGNOSIS — N1831 Chronic kidney disease, stage 3a: Secondary | ICD-10-CM

## 2022-11-02 DIAGNOSIS — M4804 Spinal stenosis, thoracic region: Secondary | ICD-10-CM

## 2022-11-02 DIAGNOSIS — E559 Vitamin D deficiency, unspecified: Secondary | ICD-10-CM | POA: Diagnosis not present

## 2022-11-02 DIAGNOSIS — I7121 Aneurysm of the ascending aorta, without rupture: Secondary | ICD-10-CM | POA: Diagnosis not present

## 2022-11-02 DIAGNOSIS — I719 Aortic aneurysm of unspecified site, without rupture: Secondary | ICD-10-CM | POA: Diagnosis not present

## 2022-11-02 DIAGNOSIS — I10 Essential (primary) hypertension: Secondary | ICD-10-CM

## 2022-11-02 DIAGNOSIS — E1121 Type 2 diabetes mellitus with diabetic nephropathy: Secondary | ICD-10-CM | POA: Diagnosis not present

## 2022-11-02 DIAGNOSIS — Z0001 Encounter for general adult medical examination with abnormal findings: Secondary | ICD-10-CM

## 2022-11-02 MED ORDER — ROSUVASTATIN CALCIUM 5 MG PO TABS: 5.0000 mg | ORAL_TABLET | Freq: Every day | ORAL | 3 refills | Status: DC

## 2022-11-02 MED ORDER — IOPAMIDOL (ISOVUE-300) INJECTION 61%
75.0000 mL | Freq: Once | INTRAVENOUS | Status: AC | PRN
  Administered 2022-11-02: 75 mL via INTRAVENOUS

## 2022-11-02 MED ORDER — EMPAGLIFLOZIN 10 MG PO TABS: 10.0000 mg | ORAL_TABLET | Freq: Every day | ORAL | 3 refills | Status: DC

## 2022-11-02 MED ORDER — POTASSIUM CHLORIDE ER 10 MEQ PO TBCR: 10.0000 meq | EXTENDED_RELEASE_TABLET | Freq: Every day | ORAL | 3 refills | Status: DC

## 2022-11-02 NOTE — Patient Instructions (Signed)
Ok to decrease the Jardiance to 10 mg per day since your last A1c was so good  Please continue all other medications as before, and refills have been done if requested.  Please have the pharmacy call with any other refills you may need.  Please continue your efforts at being more active, low cholesterol diet, and weight control.  You are otherwise up to date with prevention measures today.  Please keep your appointments with your specialists as you may have planned  You will be contacted regarding the referral for: Eye doctor - Dr Katy Fitch  Your other lab testing was very good  Please make an Appointment to return in 6 months, or sooner if needed, also with Lab Appointment for testing done 3-5 days before at the [facility] (so this is for TWO appointments - please see the scheduling desk as you leave)

## 2022-11-02 NOTE — Progress Notes (Signed)
[address]       [place] [address]             [phone]                    Roberto [facility] Medical Record [id number] Date of Birth: [birth year]  Referring: Roberto Borg, MD Primary Care: Roberto Borg, MD Primary Cardiologist: None  Chief Complaint:   No chief complaint on file.   History of Present Illness:    Roberto Rivera 72 y.o. male who presents for evaluation of thoracic aortic aneurysm.  This was identified by TTE to be 4.5cm. He has a normal EF  Since then, a CTA was completed demonstrating 4.1 ascending aorta. Personally, looking at the images some slices appear to be more like 4.4-4.5cm.   He denies a history of family having sudden death, dissection or aortic aneurysm.   Per cardiology HPI Roberto Rivera is a 72 y.o. African-American male who presents with a chief complaint of " shortness of breath and palpitations." His past medical history and cardiovascular risk factors include: Hypertension, hyperlipidemia, history of CAD, PAF, history of stroke.   Presented to the hospital with chief complaint of shortness of breath and palpitations.  He was noted to be in supraventricular tachycardia likely AVNRT and transferred to [facility], ED for further evaluation and management.  During his ER course he spontaneously converted to normal sinus rhythm.  However given his palpitations/SVT episode and recent uncontrolled hypertension patient was admitted for further evaluation and management and observation on telemetry.   Overnight patient denies any reoccurrence of palpitations.  Telemetry does not illustrate any reoccurrence of PSVT or other dysrhythmias.  Patient denies anginal discomfort or heart failure symptoms.   Patient states that he was recently seen by my partner Dr. Virgina Rivera and on 05/10/2022 and his antihypertensive medications were being changed due to elevated blood pressures and subconjunctival hemorrhage.  However he did not tolerate  labetalol and plan was to transition him back to metoprolol today.     Past Medical History:  Diagnosis Date   ALLERGIC RHINITIS 04/01/2007   Diabetes mellitus    DIABETES MELLITUS, TYPE II 04/01/2007   ERECTILE DYSFUNCTION, ORGANIC 02/07/2008   Hypercholesteremia    HYPERCHOLESTEROLEMIA 02/07/2008   Hypertension    HYPERTENSION 04/01/2007   MICROCYTOSIS 02/07/2008   OSTEOARTHRITIS, SPINE 02/07/2008   PAF (paroxysmal atrial fibrillation) ([place]) 07/11/2020   Stroke ([place])    THYROID CYST 02/07/2008    Past Surgical History:  Procedure Laterality Date   cyst removal  01/2021   back   LEFT HEART CATH AND CORONARY ANGIOGRAPHY N/A 09/28/2020   Procedure: LEFT HEART CATH AND CORONARY ANGIOGRAPHY;  Surgeon: Roberto Mormon, MD;  Location: [facility];  Service: Cardiovascular;  Laterality: N/A;    Family History  Problem Relation Age of Onset   Cancer Mother        had uncertain type of cancer   Stroke Sister      Social History   Tobacco Use  Smoking Status Former   Packs/day: 1   Types: Cigarettes   Quit date: 06/23/2020   Years since quitting: 2.3  Smokeless Tobacco Never    Social History   Substance and Sexual Activity  Alcohol Use No   Alcohol/week: 0.0 standard drinks of alcohol     No Known Allergies  Current Outpatient Medications  Medication Sig Dispense Refill   Accu-Chek FastClix Lancets  MISC USE TO CHECK BLOOD SUGAR AS DIRECTED ONCE DAILY 102 each 3   albuterol (VENTOLIN HFA) 108 (90 Base) MCG/ACT inhaler Inhale 2 puffs into the lungs every 6 (six) hours as needed for wheezing or shortness of breath. (Patient taking differently: Inhale 2 puffs into the lungs 2 (two) times daily as needed for wheezing or shortness of breath.) 24 g 3   ammonium lactate (AMLACTIN DAILY) 12 % lotion Apply 1 Application topically as needed for dry skin. 400 g 0   ammonium lactate (AMLACTIN) 12 % cream Apply 1 Application topically as needed for dry skin.     apixaban  (ELIQUIS) 5 MG TABS tablet Take 1 tablet (5 mg total) by mouth 2 (two) times daily. 180 tablet 3   Cholecalciferol 50 MCG (2000 UT) TABS 1 tab by mouth once daily (Patient taking differently: Take 2,000 Units by mouth daily.) 30 tablet 99   clotrimazole-betamethasone (LOTRISONE) cream Apply 1 application topically 2 (two) times daily. 30 g 0   diltiazem (CARDIZEM CD) 180 MG 24 hr capsule Take 1 capsule (180 mg total) by mouth every evening. 90 capsule 3   empagliflozin (JARDIANCE) 10 MG TABS tablet Take 1 tablet (10 mg total) by mouth daily before breakfast. 90 tablet 3   glucose blood (ACCU-CHEK GUIDE) test strip 1 each by Other route daily. To check blood sugars once a day 100 strip 3   hydrALAZINE (APRESOLINE) 100 MG tablet Take 1 tablet (100 mg total) by mouth 3 (three) times daily. 270 tablet 3   hydrochlorothiazide (HYDRODIURIL) 25 MG tablet Take 1 tablet (25 mg total) by mouth daily. 90 tablet 3   isosorbide dinitrate (ISORDIL) 20 MG tablet Take 1 tablet (20 mg total) by mouth 3 (three) times daily. 270 tablet 3   metoprolol tartrate (LOPRESSOR) 50 MG tablet Take 1 tablet (50 mg total) by mouth 2 (two) times daily. 60 tablet 2   nitroGLYCERIN (NITROSTAT) 0.4 MG SL tablet Place 1 tablet (0.4 mg total) under the tongue every 5 (five) minutes as needed for chest pain. 90 tablet 3   potassium chloride (KLOR-CON 10) 10 MEQ tablet Take 1 tablet (10 mEq total) by mouth daily. 90 tablet 3   rosuvastatin (CRESTOR) 5 MG tablet Take 1 tablet (5 mg total) by mouth at bedtime. 1 tab by mouth once daily 90 tablet 3   No current facility-administered medications for this visit.    ROS 14 point ROS reviewed and negative except as per HPI   PHYSICAL EXAMINATION: There were no vitals taken for this visit.  Gen: NAD Neuro: Alert and oriented Resp: Nonlaboured Abd: Soft, ntnd Extr: WWP  Diagnostic Studies & Laboratory data:     Recent Radiology Findings:   CT ANGIO CHEST AORTA W/CM & OR  WO/CM  Result Date: 11/02/2022 CLINICAL DATA:  72 year old male with aortic aneurysm EXAM: CT ANGIOGRAPHY CHEST WITH CONTRAST TECHNIQUE: Multidetector CT imaging of the chest was performed using the standard protocol during bolus administration of intravenous contrast. Multiplanar CT image reconstructions and MIPs were obtained to evaluate the vascular anatomy. RADIATION DOSE REDUCTION: This exam was performed according to the departmental dose-optimization program which includes automated exposure control, adjustment of the mA and/or kV according to patient size and/or use of iterative reconstruction technique. CONTRAST:  77m ISOVUE-300 IOPAMIDOL (ISOVUE-300) INJECTION 61% COMPARISON:  None Available. FINDINGS: Cardiovascular: Heart: Cardiomegaly. No pericardial fluid/thickening. Calcifications of the left anterior descending, circumflex, right coronary arteries. Aorta: Minimal aortic valve calcifications. Greatest estimated diameter of the aortic  annulus 3.2 mm on the coronal reformatted images. Greatest estimated diameter of the sino-tubular junction, 33.5 mm on the coronal images. Greatest estimated diameter of the ascending aorta on the axial images, 41 mm. Atherosclerotic changes of the aorta. Branch vessels are patent with a 3 vessel arch. Cervical cerebral vessels patent at the base of the neck. No pedunculated plaque, ulcerated plaque, dissection, periaortic fluid. No wall thickening. Pulmonary arteries: Timing of the contrast bolus is not optimized for evaluation of pulmonary artery filling defects. Unremarkable size of the main pulmonary artery. Mediastinum/Nodes: There is a rounded soft tissue density with intermediate Hounsfield units within the anterior mediastinum, 1.9 cm with no internal calcifications or cystic changes. Additionally, there are some smaller soft tissue nodules/lymph nodes within the anterior mediastinum/pre-vascular space, as well as paratracheal lymph nodes and subcarinal lymph  node, borderline enlarged. No comparison available. There is also in intermediate density rounded soft tissue lesion in the retrocrural space at the level of the diaphragm, 2.1 cm. No comparison available. Unremarkable thoracic esophagus. Unremarkable appearance of the thoracic inlet. Lungs/Pleura: Central airways are clear. No pleural effusion. No confluent airspace disease. Scarring/atelectasis at the lung bases.  No pneumothorax. Upper Abdomen: No acute finding of the upper abdomen. Musculoskeletal: No acute displaced fracture. Degenerative changes of the spine. Review of the MIP images confirms the above findings. IMPRESSION: Largest estimated diameter of the ascending aorta is approximately 4.1 cm. Recommend annual imaging followup by CTA or MRA. This recommendation follows 2010 ACCF/AHA/AATS/ACR/ASA/SCA/SCAI/SIR/STS/SVM Guidelines for the Diagnosis and Management of Patients with Thoracic Aortic Disease. Circulation. 2010; [id number]. Aortic aneurysm NOS (ICD10-I71.9) **An incidental finding of potential clinical significance has been found. Anterior mediastinal soft tissue nodule measures 1.9 cm.** While this might represent a low-grade thymic tumor, there are also additional borderline lymph nodes within the mediastinum, as well as a second 2 cm soft tissue focus in the right retrocrural space. A unifying diagnosis may be lymphoproliferative disorder/lymphoma, and correlation with lab values is recommended as well as consideration of full-body CT imaging for additional lymphadenopathy. If the anterior mediastinal lesion requires independent workup for possible thymic tumor, consider thoracic tumor board evaluation for possible surgical biopsy versus PET. Aortic atherosclerosis and coronary artery disease. Aortic Atherosclerosis (ICD10-I70.0). Additional ancillary findings as above. Signed, Dulcy Fanny. Nadene Rubins, RPVI [facility]  Electronically Signed   By: Corrie Mckusick D.O.   On: 11/02/2022 13:56   MR Thoracic Spine Wo Contrast  Result Date: 10/24/2022 CLINICAL DATA:  Sclerotic lesion 1 chest x-ray EXAM: MRI THORACIC SPINE WITHOUT CONTRAST TECHNIQUE: Multiplanar, multisequence MR imaging of the thoracic spine was performed. No intravenous contrast was administered. COMPARISON:  None Available. FINDINGS: Alignment:  Mild levocurvature Vertebrae: No fracture, evidence of discitis, or bone lesion. Cord:  Normal signal and morphology. Paraspinal and other soft tissues: No perispinal mass or inflammation seen Disc levels: Spondylitic spurring with confluent osteophytes towards the left. Far-lateral osteophytes may explain the radiographic findings given lack of focal bone lesion. Notable endplate degeneration at T9-10 and T10-11 with disc bulging especially at T10-11. Combined with ligamentum flavum thickening there is spinal stenosis and cord indentation. T10-11 biforaminal impingement. IMPRESSION: 1. Negative for bone lesion. The radiographic finding may be related to patient's prominent spondylitic spurring. 2. Thoracic spine degeneration most notable at T10-11 where there is mild to moderate spinal stenosis and biforaminal impingement. Electronically Signed   By: Jorje Guild M.D.   On: 10/24/2022 20:56   DG Chest 2 View  Result Date: 10/15/2022 CLINICAL DATA:  Repeat with nipple markers. EXAM: CHEST - 2 VIEW COMPARISON:  Chest x-ray October 06, 2022 FINDINGS: Cardiomegaly remains. The hila and mediastinum are normal. No pneumothorax. No suspicious nodules identified on today's study. No infiltrates. No overt edema. Again noted is sclerosis over the posterior aspect of an upper thoracic vertebral body which by report was new compared to June 30, 2020. No other significant abnormalities. IMPRESSION: 1. No suspicious nodules identified on today's study. 2. Cardiomegaly. 3. Sclerosis over the posterior aspect of an upper  thoracic vertebral body is again identified. By report, this was new compared to June 30, 2020. Recommend a thoracic spine MRI for further evaluation. Electronically Signed   By: Dorise Bullion III M.D.   On: 10/15/2022 12:18   DG Chest 2 View  Result Date: 10/10/2022 CLINICAL DATA:  Persistent productive cough. EXAM: CHEST - 2 VIEW COMPARISON:  Chest x-ray 06/30/2020. Thoracic spine series 05/31/2007. FINDINGS: Mediastinum and hilar structures appear stable. Cardiomegaly. Mild pulmonary venous congestion. Aortic atherosclerotic vascular calcification. Tiny nodular density noted projected over the right lung base, possibly a nipple shadow. Repeat PA and lateral chest x-ray with nipple markers suggested for further evaluation. No focal infiltrate. Mild pleural thickening consistent scarring. No pleural effusion or pneumothorax. Diffuse degenerative change thoracic spine. Sclerotic density is noted projected over the posterior aspect of an upper thoracic vertebral body. This is new from prior thoracic spine series of 05/31/2007. MRI of the thoracic spine suggested for further evaluation in order to exclude a focal bony abnormality. IMPRESSION: 1. Cardiomegaly with mild pulmonary venous congestion. No focal infiltrate or edema. No pleural effusion. 2. Tiny nodular density noted projected over the right lung base, possibly a nipple shadow. Repeat PA and lateral chest x-ray with nipple markers suggested for further evaluation. 3. Sclerotic density is noted projected over the posterior aspect of an upper thoracic vertebral body. This is new from prior thoracic spine series of 05/31/2007. MRI of the thoracic spine suggested for further evaluation in order to exclude a focal bony abnormality. Electronically Signed   By: Marcello Moores  Register M.D.   On: 10/10/2022 08:41       I have independently reviewed the above radiology studies  and reviewed the findings with the patient.   Recent Lab Findings: Lab Results   Component Value Date   WBC 7.7 10/06/2022   HGB 12.9 (L) 10/06/2022   HCT 40.3 10/06/2022   PLT 247.0 10/06/2022   GLUCOSE 119 (H) 10/06/2022   CHOL 109 10/06/2022   TRIG 60.0 10/06/2022   HDL 43.20 10/06/2022   LDLCALC 54 10/06/2022   ALT 15 10/06/2022   AST 15 10/06/2022   NA 139 10/06/2022   K 3.6 10/06/2022   CL 102 10/06/2022   CREATININE 1.60 (H) 10/06/2022   BUN 23 10/06/2022   CO2 29 10/06/2022   TSH 1.20 10/06/2022   INR 1.0 06/30/2020   HGBA1C 5.4 10/06/2022     Assessment / Plan:   Roberto Rivera 72 y.o. male who presents for evaluation of thoracic aortic aneurysm.   Echo read 4.5cm. By CTA, read is 4.1cm. To me visually on the CT, it look more like [id number] in certain sections.   He understands that without genetic predisposition generally 5.0 cm is the earliest operation could be considered.   We will obtain another CTA in 6 months, then yearly.   Blood pressure control was emphasized as paramount to reduce the likelihood of aortic dilation, dissection or rupture.  Recently BP 150s/90s - he needs better control of this. He needs to see cardiology again.  We extensively discussed that if he has severe chest  or back pain, he should see immediate medical treatment as aortic dissection or rupture is a possibility and can lead to emergent surgery, disability or death.      I  spent 60 minutes with  the patient face to face in counseling and coordination of care.    Pierre Bali Varian Innes 11/02/2022 5:21 PM

## 2022-11-02 NOTE — Progress Notes (Signed)
Patient ID: Carney Mercadel, male   DOB: 1950/09/08, 72 y.o.   MRN: [id number]         Chief Complaint:: wellness exam and dm, thoracic spinal stenosis, htn, aortic aneurysm, ckd3a, htn, hld       HPI:  Roberto Rivera is a 72 y.o. male here for wellness exam; declines tdap, covid booster, flu shot, colonoscopy but may consider shingrx at pharmacy, o/w up to date; due for eye exam referral                        Also has persistent but now milder mid thoracic pain without radicular pain or gait change.  Pt concerned jardiance 25 mg was too high dose, but willing to take 10 mg.  Tolerating HCT well.  Pt denies chest pain, increased sob or doe, wheezing, orthopnea, PND, increased LE swelling, palpitations, dizziness or syncope.   Pt denies polydipsia, polyuria, or new focal neuro s/s.    Pt denies fever, wt loss, night sweats, loss of appetite, or other constitutional symptoms     Wt Readings from Last 3 Encounters:  11/02/22 236 lb (107 kg)  10/12/22 235 lb (106.6 kg)  10/06/22 235 lb (106.6 kg)   BP Readings from Last 3 Encounters:  11/02/22 136/82  10/12/22 136/82  10/06/22 (!) 144/90   Immunization History  Administered Date(s) Administered   PFIZER(Purple Top)SARS-COV-2 Vaccination 04/23/2020, 05/14/2020   Pneumococcal Conjugate-13 12/25/2017   Pneumococcal Polysaccharide-23 02/04/2010, 01/28/2020   Td 01/29/2009   Zoster, Live 12/14/2011   Health Maintenance Due  Topic Date Due   DTaP/Tdap/Td (2 - Tdap) 01/30/2019   COVID-19 Vaccine (3 - Pfizer risk series) 06/11/2020      Past Medical History:  Diagnosis Date   ALLERGIC RHINITIS 04/01/2007   Diabetes mellitus    DIABETES MELLITUS, TYPE II 04/01/2007   ERECTILE DYSFUNCTION, ORGANIC 02/07/2008   Hypercholesteremia    HYPERCHOLESTEROLEMIA 02/07/2008   Hypertension    HYPERTENSION 04/01/2007   MICROCYTOSIS 02/07/2008   OSTEOARTHRITIS, SPINE 02/07/2008   PAF (paroxysmal atrial fibrillation) ([place]) 07/11/2020   Stroke ([place])    THYROID CYST  02/07/2008   Past Surgical History:  Procedure Laterality Date   cyst removal  01/2021   back   LEFT HEART CATH AND CORONARY ANGIOGRAPHY N/A 09/28/2020   Procedure: LEFT HEART CATH AND CORONARY ANGIOGRAPHY;  Surgeon: Nigel Mormon, MD;  Location: [facility];  Service: Cardiovascular;  Laterality: N/A;    reports that he quit smoking about 2 years ago. His smoking use included cigarettes. He smoked an average of 1 pack per day. He has never used smokeless tobacco. He reports that he does not drink alcohol and does not use drugs. family history includes Cancer in his mother; Stroke in his sister. No Known Allergies Current Outpatient Medications on File Prior to Visit  Medication Sig Dispense Refill   Accu-Chek FastClix Lancets MISC USE TO CHECK BLOOD SUGAR AS DIRECTED ONCE DAILY 102 each 3   albuterol (VENTOLIN HFA) 108 (90 Base) MCG/ACT inhaler Inhale 2 puffs into the lungs every 6 (six) hours as needed for wheezing or shortness of breath. (Patient taking differently: Inhale 2 puffs into the lungs 2 (two) times daily as needed for wheezing or shortness of breath.) 24 g 3   ammonium lactate (AMLACTIN DAILY) 12 % lotion Apply 1 Application topically as needed for dry skin. 400 g 0   ammonium lactate (AMLACTIN) 12 % cream Apply 1 Application topically as  needed for dry skin.     apixaban (ELIQUIS) 5 MG TABS tablet Take 1 tablet (5 mg total) by mouth 2 (two) times daily. 180 tablet 3   Cholecalciferol 50 MCG (2000 UT) TABS 1 tab by mouth once daily (Patient taking differently: Take 2,000 Units by mouth daily.) 30 tablet 99   clotrimazole-betamethasone (LOTRISONE) cream Apply 1 application topically 2 (two) times daily. 30 g 0   diltiazem (CARDIZEM CD) 180 MG 24 hr capsule Take 1 capsule (180 mg total) by mouth every evening. 90 capsule 3   glucose blood (ACCU-CHEK GUIDE) test strip 1 each by Other route daily. To check blood sugars once a day 100 strip 3   hydrALAZINE (APRESOLINE)  100 MG tablet Take 1 tablet (100 mg total) by mouth 3 (three) times daily. 270 tablet 3   hydrochlorothiazide (HYDRODIURIL) 25 MG tablet Take 1 tablet (25 mg total) by mouth daily. 90 tablet 3   isosorbide dinitrate (ISORDIL) 20 MG tablet Take 1 tablet (20 mg total) by mouth 3 (three) times daily. 270 tablet 3   metoprolol tartrate (LOPRESSOR) 50 MG tablet Take 1 tablet (50 mg total) by mouth 2 (two) times daily. 60 tablet 2   nitroGLYCERIN (NITROSTAT) 0.4 MG SL tablet Place 1 tablet (0.4 mg total) under the tongue every 5 (five) minutes as needed for chest pain. 90 tablet 3   No current facility-administered medications on file prior to visit.        ROS:  All others reviewed and negative.  Objective        PE:  BP 136/82   Pulse 66   Temp 97.8 F (36.6 C) (Oral)   Ht 5\' 11"  (1.803 m)   Wt 236 lb (107 kg)   SpO2 97%   BMI 32.92 kg/m                 Constitutional: Pt appears in NAD               HENT: Head: NCAT.                Right Ear: External ear normal.                 Left Ear: External ear normal.                Eyes: . Pupils are equal, round, and reactive to light. Conjunctivae and EOM are normal               Nose: without d/c or deformity               Neck: Neck supple. Gross normal ROM               Cardiovascular: Normal rate and regular rhythm.                 Pulmonary/Chest: Effort normal and breath sounds without rales or wheezing.                Abd:  Soft, NT, ND, + BS, no organomegaly               Neurological: Pt is alert. At baseline orientation, motor grossly intact               Skin: Skin is warm. No rashes, no other new lesions, LE edema - trace bilateral               Psychiatric: Pt behavior is normal without agitation  Micro: none  Cardiac tracings I have personally interpreted today:  none  Pertinent Radiological findings (summarize): none   Lab Results  Component Value Date   WBC 7.7 10/06/2022   HGB 12.9 (L) 10/06/2022   HCT 40.3  10/06/2022   PLT 247.0 10/06/2022   GLUCOSE 119 (H) 10/06/2022   CHOL 109 10/06/2022   TRIG 60.0 10/06/2022   HDL 43.20 10/06/2022   LDLCALC 54 10/06/2022   ALT 15 10/06/2022   AST 15 10/06/2022   NA 139 10/06/2022   K 3.6 10/06/2022   CL 102 10/06/2022   CREATININE 1.60 (H) 10/06/2022   BUN 23 10/06/2022   CO2 29 10/06/2022   TSH 1.20 10/06/2022   PSA 2.65 10/06/2022   INR 1.0 06/30/2020   HGBA1C 5.4 10/06/2022   MICROALBUR 10.6 (H) 10/06/2022   Assessment/Plan:  Brinton Glendening is a 72 y.o. Black or African American [2] male with  has a past medical history of ALLERGIC RHINITIS (04/01/2007), Diabetes mellitus, DIABETES MELLITUS, TYPE II (04/01/2007), ERECTILE DYSFUNCTION, ORGANIC (02/07/2008), Hypercholesteremia, HYPERCHOLESTEROLEMIA (02/07/2008), Hypertension, HYPERTENSION (04/01/2007), MICROCYTOSIS (02/07/2008), OSTEOARTHRITIS, SPINE (02/07/2008), PAF (paroxysmal atrial fibrillation) ([place]) (07/11/2020), Stroke ([place]), and THYROID CYST (02/07/2008).  Encounter for well adult exam with abnormal findings Age and sex appropriate education and counseling updated with regular exercise and diet Referrals for preventative services - refer optho for yearly exam dr Katy Fitch, declines colonscopy Immunizations addressed - declines tdap, covid booster, flu shot Smoking counseling  - none needed Evidence for depression or other mood disorder - none significant Most recent labs reviewed. I have personally reviewed and have noted: 1) the patient's medical and social history 2) The patient's current medications and supplements 3) The patient's height, weight, and BMI have been recorded in the chart   Aneurysm of ascending aorta without rupture ([place]) Ct done, now for CV surgury f/u soon  CKD (chronic kidney disease) stage 3, GFR 30-59 ml/min ([facility]) Lab Results  Component Value Date   CREATININE 1.60 (H) 10/06/2022   Stable overall, cont to avoid nephrotoxins  Diabetes ([place]) Lab Results  Component  Value Date   HGBA1C 5.4 10/06/2022   Stable but in light of ckd - for start jardiance 10 mg qd   Essential hypertension BP Readings from Last 3 Encounters:  11/02/22 136/82  10/12/22 136/82  10/06/22 (!) 144/90   Stable, pt to continue medical treatment card cd 180 qd, hydralazine 100 tid, hct 25 qd, lopressor 50 bid   Hyperlipidemia associated with type 2 diabetes mellitus ([place]) Lab Results  Component Value Date   LDLCALC 54 10/06/2022   Stable, pt to continue current statin crestor 5 mg qd   Vitamin D deficiency Last vitamin D Lab Results  Component Value Date   VD25OH 61.36 10/06/2022   Stable, cont oral replacement   Thoracic spinal stenosis Mild recent onset pain, for tylenol prn Followup: Return in about 6 months (around 05/05/2023).  Cathlean Cower, MD 11/04/2022 8:16 PM [facility]

## 2022-11-04 ENCOUNTER — Encounter: Payer: Self-pay | Admitting: Internal Medicine

## 2022-11-04 DIAGNOSIS — M4804 Spinal stenosis, thoracic region: Secondary | ICD-10-CM | POA: Insufficient documentation

## 2022-11-04 NOTE — Assessment & Plan Note (Signed)
Lab Results  Component Value Date   LDLCALC 54 10/06/2022   Stable, pt to continue current statin crestor 5 mg qd

## 2022-11-04 NOTE — Assessment & Plan Note (Signed)
Ct done, now for CV surgury f/u soon

## 2022-11-04 NOTE — Assessment & Plan Note (Signed)
Last vitamin D Lab Results  Component Value Date   VD25OH 61.36 10/06/2022   Stable, cont oral replacement  

## 2022-11-04 NOTE — Assessment & Plan Note (Signed)
BP Readings from Last 3 Encounters:  11/02/22 136/82  10/12/22 136/82  10/06/22 (!) 144/90   Stable, pt to continue medical treatment card cd 180 qd, hydralazine 100 tid, hct 25 qd, lopressor 50 bid

## 2022-11-04 NOTE — Assessment & Plan Note (Signed)
Lab Results  Component Value Date   CREATININE 1.60 (H) 10/06/2022   Stable overall, cont to avoid nephrotoxins 

## 2022-11-04 NOTE — Assessment & Plan Note (Signed)
Mild recent onset pain, for tylenol prn

## 2022-11-04 NOTE — Addendum Note (Signed)
Addended by: [name] on: 11/04/2022 08:18 PM   Modules accepted: Orders

## 2022-11-04 NOTE — Assessment & Plan Note (Signed)
Age and sex appropriate education and counseling updated with regular exercise and diet Referrals for preventative services - refer optho for yearly exam dr Katy Fitch, declines colonscopy Immunizations addressed - declines tdap, covid booster, flu shot Smoking counseling  - none needed Evidence for depression or other mood disorder - none significant Most recent labs reviewed. I have personally reviewed and have noted: 1) the patient's medical and social history 2) The patient's current medications and supplements 3) The patient's height, weight, and BMI have been recorded in the chart

## 2022-11-04 NOTE — Assessment & Plan Note (Signed)
Lab Results  Component Value Date   HGBA1C 5.4 10/06/2022   Stable but in light of ckd - for start jardiance 10 mg qd

## 2022-11-09 ENCOUNTER — Ambulatory Visit: Payer: Medicare Other | Admitting: Internal Medicine

## 2022-11-13 ENCOUNTER — Telehealth: Payer: Self-pay

## 2022-11-13 NOTE — Telephone Encounter (Signed)
Called patient to schedule Medicare Annual Wellness Visit (AWV). No voicemail available to leave a message.  Last date of AWV: 11/03/20  Please schedule an appointment at any time with [facility].    Norton Blizzard, [place] ([facility])  [facility] [phone]

## 2022-11-16 ENCOUNTER — Institutional Professional Consult (permissible substitution): Payer: Medicare Other | Admitting: Cardiothoracic Surgery

## 2022-11-16 VITALS — BP 130/88 | HR 77 | Resp 20 | Ht 71.0 in | Wt 230.0 lb

## 2022-11-16 DIAGNOSIS — I7121 Aneurysm of the ascending aorta, without rupture: Secondary | ICD-10-CM | POA: Diagnosis not present

## 2022-11-17 ENCOUNTER — Ambulatory Visit: Payer: Medicare Other | Admitting: Internal Medicine

## 2022-11-27 ENCOUNTER — Other Ambulatory Visit: Payer: Self-pay

## 2022-11-27 MED ORDER — DILTIAZEM HCL ER COATED BEADS 180 MG PO CP24: 180.0000 mg | ORAL_CAPSULE | Freq: Every evening | ORAL | 3 refills | Status: DC

## 2023-01-18 ENCOUNTER — Other Ambulatory Visit: Payer: Self-pay

## 2023-01-18 MED ORDER — METOPROLOL TARTRATE 50 MG PO TABS: 50.0000 mg | ORAL_TABLET | Freq: Two times a day (BID) | ORAL | 2 refills | Status: DC

## 2023-02-08 ENCOUNTER — Ambulatory Visit (INDEPENDENT_AMBULATORY_CARE_PROVIDER_SITE_OTHER): Payer: Medicare Other

## 2023-02-08 VITALS — Ht 71.0 in | Wt 231.0 lb

## 2023-02-08 DIAGNOSIS — Z0000 Encounter for general adult medical examination without abnormal findings: Secondary | ICD-10-CM | POA: Diagnosis not present

## 2023-02-08 NOTE — Progress Notes (Signed)
Subjective:   Roberto Rivera is a 72 y.o. male who presents for Medicare Annual/Subsequent preventive examination.  Visit Complete: Virtual  I connected with  Roberto Rivera on 02/08/23 by a audio enabled telemedicine application and verified that I am speaking with the correct person using two identifiers.  Patient Location: Home  Provider Location: Home Office  I discussed the limitations of evaluation and management by telemedicine. The patient expressed understanding and agreed to proceed.  Patient Medicare AWV questionnaire was completed by the patient on ; I have confirmed that all information answered by patient is correct and no changes since this date.  Review of Systems     Cardiac Risk Factors include: advanced age (>90men, >66 women);diabetes mellitus;male gender     Objective:    Today's Vitals   02/08/23 0834  Weight: 231 lb (104.8 kg)  Height: 5\' 11"  (1.803 m)   Body mass index is 32.22 kg/m.     02/08/2023    8:44 AM 03/06/2022    3:13 PM 11/03/2020    9:26 AM 09/28/2020   10:27 AM 07/19/2020    3:05 PM 07/01/2020    2:51 AM 12/25/2017    3:33 PM  Advanced Directives  Does Patient Have a Medical Advance Directive? No No No No No No No  Would patient like information on creating a medical advance directive? No - Patient declined No - Patient declined No - Patient declined No - Patient declined No - Patient declined No - Patient declined     Current Medications (verified) Outpatient Encounter Medications as of 02/08/2023  Medication Sig   Accu-Chek FastClix Lancets MISC USE TO CHECK BLOOD SUGAR AS DIRECTED ONCE DAILY   albuterol (VENTOLIN HFA) 108 (90 Base) MCG/ACT inhaler Inhale 2 puffs into the lungs every 6 (six) hours as needed for wheezing or shortness of breath. (Patient taking differently: Inhale 2 puffs into the lungs 2 (two) times daily as needed for wheezing or shortness of breath.)   ammonium lactate (AMLACTIN DAILY) 12 % lotion Apply 1 Application topically  as needed for dry skin.   ammonium lactate (AMLACTIN) 12 % cream Apply 1 Application topically as needed for dry skin.   apixaban (ELIQUIS) 5 MG TABS tablet Take 1 tablet (5 mg total) by mouth 2 (two) times daily.   Cholecalciferol 50 MCG (2000 UT) TABS 1 tab by mouth once daily (Patient taking differently: Take 2,000 Units by mouth daily.)   clotrimazole-betamethasone (LOTRISONE) cream Apply 1 application topically 2 (two) times daily.   diltiazem (CARDIZEM CD) 180 MG 24 hr capsule Take 1 capsule (180 mg total) by mouth every evening.   empagliflozin (JARDIANCE) 10 MG TABS tablet Take 1 tablet (10 mg total) by mouth daily before breakfast.   glucose blood (ACCU-CHEK GUIDE) test strip 1 each by Other route daily. To check blood sugars once a day   hydrALAZINE (APRESOLINE) 100 MG tablet Take 1 tablet (100 mg total) by mouth 3 (three) times daily.   hydrochlorothiazide (HYDRODIURIL) 25 MG tablet Take 1 tablet (25 mg total) by mouth daily.   isosorbide dinitrate (ISORDIL) 20 MG tablet Take 1 tablet (20 mg total) by mouth 3 (three) times daily.   metoprolol tartrate (LOPRESSOR) 50 MG tablet Take 1 tablet (50 mg total) by mouth 2 (two) times daily.   nitroGLYCERIN (NITROSTAT) 0.4 MG SL tablet Place 1 tablet (0.4 mg total) under the tongue every 5 (five) minutes as needed for chest pain.   potassium chloride (KLOR-CON 10) 10 MEQ tablet  Take 1 tablet (10 mEq total) by mouth daily.   rosuvastatin (CRESTOR) 5 MG tablet Take 1 tablet (5 mg total) by mouth at bedtime. 1 tab by mouth once daily   No facility-administered encounter medications on file as of 02/08/2023.    Allergies (verified) Patient has no known allergies.   History: Past Medical History:  Diagnosis Date   ALLERGIC RHINITIS 04/01/2007   Diabetes mellitus    DIABETES MELLITUS, TYPE II 04/01/2007   ERECTILE DYSFUNCTION, ORGANIC 02/07/2008   Hypercholesteremia    HYPERCHOLESTEROLEMIA 02/07/2008   Hypertension    HYPERTENSION 04/01/2007    MICROCYTOSIS 02/07/2008   OSTEOARTHRITIS, SPINE 02/07/2008   PAF (paroxysmal atrial fibrillation) (HCC) 07/11/2020   Stroke (HCC)    THYROID CYST 02/07/2008   Past Surgical History:  Procedure Laterality Date   cyst removal  01/2021   back   LEFT HEART CATH AND CORONARY ANGIOGRAPHY N/A 09/28/2020   Procedure: LEFT HEART CATH AND CORONARY ANGIOGRAPHY;  Surgeon: Elder Negus, MD;  Location: [facility];  Service: Cardiovascular;  Laterality: N/A;   Family History  Problem Relation Age of Onset   Cancer Mother        had uncertain type of cancer   Stroke Sister    Social History   Socioeconomic History   Marital status: Divorced    Spouse name: Not on file   Number of children: 2   Years of education: Not on file   Highest education level: Not on file  Occupational History   Occupation: Theatre stage manager: [employer]  Tobacco Use   Smoking status: Former    Packs/day: 1    Types: Cigarettes    Quit date: 06/23/2020    Years since quitting: 2.6   Smokeless tobacco: Never  Vaping Use   Vaping Use: Never used  Substance and Sexual Activity   Alcohol use: No    Alcohol/week: 0.0 standard drinks of alcohol   Drug use: No   Sexual activity: Not on file  Other Topics Concern   Not on file  Social History Narrative   Divorced x many years   Social Determinants of Health   Financial Resource Strain: Low Risk  (02/08/2023)   Overall Financial Resource Strain (CARDIA)    Difficulty of Paying Living Expenses: Not hard at all  Food Insecurity: No Food Insecurity (02/08/2023)   Hunger Vital Sign    Worried About Running Out of Food in the Last Year: Never true    Ran Out of Food in the Last Year: Never true  Transportation Needs: No Transportation Needs (02/08/2023)   PRAPARE - Administrator, Civil Service (Medical): No    Lack of Transportation (Non-Medical): No  Physical Activity: Inactive (02/08/2023)   Exercise Vital Sign    Days of Exercise  per Week: 0 days    Minutes of Exercise per Session: 0 min  Stress: No Stress Concern Present (02/08/2023)   [employer] of Occupational Health - Occupational Stress Questionnaire    Feeling of Stress : Not at all  Social Connections: Socially Isolated (02/08/2023)   Social Connection and Isolation Panel [NHANES]    Frequency of Communication with Friends and Family: More than three times a week    Frequency of Social Gatherings with Friends and Family: More than three times a week    Attends Religious Services: Never    Database administrator or Organizations: No    Attends Banker Meetings: Never  Marital Status: Divorced    Tobacco Counseling Counseling given: Not Answered   Clinical Intake:  Pre-visit preparation completed: No  Pain : No/denies pain     BMI - recorded: 32.22 Nutritional Status: BMI > 30  Obese Nutritional Risks: None Diabetes: Yes CBG done?: Yes (CBG 124 Taken by patient) CBG resulted in Enter/ Edit results?: Yes Did pt. bring in CBG monitor from home?: No  How often do you need to have someone help you when you read instructions, pamphlets, or other written materials from your doctor or pharmacy?: 1 - Never  Interpreter Needed?: No  Information entered by :: Theresa Mulligan LPN   Activities of Daily Living    02/08/2023    8:43 AM 05/16/2022    6:55 PM  In your present state of health, do you have any difficulty performing the following activities:  Hearing? 0 0  Vision? 0 0  Difficulty concentrating or making decisions? 0 0  Walking or climbing stairs? 0 0  Dressing or bathing? 0 0  Doing errands, shopping? 0   Preparing Food and eating ? N   Using the Toilet? N   In the past six months, have you accidently leaked urine? N   Do you have problems with loss of bowel control? N   Managing your Medications? N   Managing your Finances? N   Housekeeping or managing your Housekeeping? N     Patient Care Team: Corwin Levins, MD as PCP - General (Internal Medicine) Larey Dresser, DPM (Podiatry) [place], Vinnie Level, [facility] (Inactive) as Pharmacist (Pharmacist)  Indicate any recent Medical Services you may have received from other than [facility] providers in the past year (date may be approximate).     Assessment:   This is a routine wellness examination for [place].  Hearing/Vision screen Hearing Screening - Comments:: Denies hearing difficulties   Vision Screening - Comments:: Wears rx glasses - up to date with routine eye exams with  [facility]  Dietary issues and exercise activities discussed:     Goals Addressed               This Visit's Progress     No current goals (pt-stated)         Depression Screen    02/08/2023    8:42 AM 10/06/2022    9:15 AM 05/11/2022    9:56 AM 03/06/2022    3:06 PM 11/16/2021    3:22 PM 05/11/2021    8:40 AM 11/03/2020    9:25 AM  PHQ 2/9 Scores  PHQ - 2 Score 0 0 0 0 1 0 0  PHQ- 9 Score  0 0        Fall Risk    02/08/2023    8:43 AM 10/06/2022    9:15 AM 05/19/2022    8:35 AM 05/11/2022    9:56 AM 03/06/2022    3:13 PM  Fall Risk   Falls in the past year? 0 0 0 0 0  Number falls in past yr: 0 0   0  Injury with Fall? 0 0 0  0  Risk for fall due to : No Fall Risks No Fall Risks No Fall Risks No Fall Risks No Fall Risks  Follow up Falls prevention discussed Falls evaluation completed Falls evaluation completed Falls evaluation completed     MEDICARE RISK AT HOME:  Medicare Risk at Home - 02/08/23 0847     Any stairs in or around the home? No  If so, are there any without handrails? No    Home free of loose throw rugs in walkways, pet beds, electrical cords, etc? Yes    Adequate lighting in your home to reduce risk of falls? Yes    Life alert? No    Use of a cane, walker or w/c? No    Grab bars in the bathroom? Yes    Shower chair or bench in shower? No    Elevated toilet seat or a handicapped toilet? No             TIMED UP AND GO:  Was the  test performed?  No    Cognitive Function:        02/08/2023    8:44 AM 03/06/2022    3:13 PM  6CIT Screen  What Year? 0 points 0 points  What month? 0 points 0 points  What time? 0 points   Count back from 20 0 points   Months in reverse 0 points   Repeat phrase 0 points   Total Score 0 points     Immunizations Immunization History  Administered Date(s) Administered   PFIZER(Purple Top)SARS-COV-2 Vaccination 04/23/2020, 05/14/2020   Pneumococcal Conjugate-13 12/25/2017   Pneumococcal Polysaccharide-23 02/04/2010, 01/28/2020   Td 01/29/2009   Zoster, Live 12/14/2011    TDAP status: Due, Education has been provided regarding the importance of this vaccine. Advised may receive this vaccine at local pharmacy or Health Dept. Aware to provide a copy of the vaccination record if obtained from local pharmacy or Health Dept. Verbalized acceptance and understanding.    Pneumococcal vaccine status: Up to date  Covid-19 vaccine status: Completed vaccines  Qualifies for Shingles Vaccine? Yes   Zostavax completed No   Shingrix Completed?: No.    Education has been provided regarding the importance of this vaccine. Patient has been advised to call insurance company to determine out of pocket expense if they have not yet received this vaccine. Advised may also receive vaccine at local pharmacy or Health Dept. Verbalized acceptance and understanding.  Screening Tests Health Maintenance  Topic Date Due   DTaP/Tdap/Td (2 - Tdap) 01/30/2019   COVID-19 Vaccine (3 - Pfizer risk series) 02/24/2023 (Originally 06/11/2020)   Zoster Vaccines- Shingrix (1 of 2) 05/11/2023 (Originally [birth year]14/1970)   Colonoscopy  07/03/2024 (Originally 10/14/1996)   INFLUENZA VACCINE  03/22/2023   HEMOGLOBIN A1C  04/06/2023   FOOT EXAM  05/12/2023   OPHTHALMOLOGY EXAM  05/12/2023   Diabetic kidney evaluation - eGFR measurement  10/07/2023   Diabetic kidney evaluation - Urine ACR  10/07/2023   Medicare Annual  Wellness (AWV)  02/08/2024   Pneumonia Vaccine 57+ Years old  Completed   Hepatitis C Screening  Completed   HPV VACCINES  Aged Out    Health Maintenance  Health Maintenance Due  Topic Date Due   DTaP/Tdap/Td (2 - Tdap) 01/30/2019      Lung Cancer Screening: (Low Dose CT Chest recommended if Age 53-80 years, 20 pack-year currently smoking OR have quit w/in 15years.) does not qualify.    Additional Screening:  Hepatitis C Screening: does qualify; Completed 03/10/16  Vision Screening: Recommended annual ophthalmology exams for early detection of glaucoma and other disorders of the eye. Is the patient up to date with their annual eye exam?  Yes  Who is the provider or what is the name of the office in which the patient attends annual eye exams? [facility] If pt is not established with a provider, would they like  to be referred to a provider to establish care? No .   Dental Screening: Recommended annual dental exams for proper oral hygiene  Diabetic Foot Exam: Diabetic Foot Exam: Overdue, Pt has been advised about the importance in completing this exam. Pt is scheduled for diabetic foot exam on Followed by PCP.  Community Resource Referral / Chronic Care Management:  CRR required this visit?  No   CCM required this visit?  No     Plan:     I have personally reviewed and noted the following in the patient's chart:   Medical and social history Use of alcohol, tobacco or illicit drugs  Current medications and supplements including opioid prescriptions. Patient is not currently taking opioid prescriptions. Functional ability and status Nutritional status Physical activity Advanced directives List of other physicians Hospitalizations, surgeries, and ER visits in previous 12 months Vitals Screenings to include cognitive, depression, and falls Referrals and appointments  In addition, I have reviewed and discussed with patient certain preventive protocols, quality  metrics, and best practice recommendations. A written personalized care plan for preventive services as well as general preventive health recommendations were provided to patient.     Tillie Rung, LPN   [id number]/61/[id number]   After Visit Summary: (MyChart) Due to this being a telephonic visit, the after visit summary with patients personalized plan was offered to patient via MyChart   Nurse Notes:  None

## 2023-02-08 NOTE — Patient Instructions (Addendum)
Roberto Rivera , Thank you for taking time to come for your Medicare Wellness Visit. I appreciate your ongoing commitment to your health goals. Please review the following plan we discussed and let me know if I can assist you in the future.   These are the goals we discussed:  Goals       Monitor and Manage My Blood Sugar-Diabetes Type 2      Timeframe:  Long-Range Goal Priority:  High Start Date:  05/19/2021                          Expected End Date: 11/16/2021                      Follow Up Date 09/18/2021   - check blood sugar at prescribed times - check blood sugar before and after exercise - check blood sugar if I feel it is too high or too low - enter blood sugar readings and medication or insulin into daily log - take the blood sugar log to all doctor visits    Why is this important?   Checking your blood sugar at home helps to keep it from getting very high or very low.  Writing the results in a diary or log helps the doctor know how to care for you.  Your blood sugar log should have the time, date and the results.  Also, write down the amount of insulin or other medicine that you take.  Other information, like what you ate, exercise done and how you were feeling, will also be helpful.        No current goals (pt-stated)      No current goals (pt-stated)      Track and Manage Blood pressure, Heart Rate, and Rhythm-Atrial Fibrillation/ HTN      Timeframe:  Long-Range Goal Priority:  High Start Date:  05/19/2021                          Expected End Date:  11/16/2021                     Follow Up Date 09/18/2021  -check blood pressure once a day  - check pulse (heart) rate before taking medicine - check pulse (heart) rate once a day - make a plan to exercise regularly - make a plan to eat healthy - keep all lab appointments - take medicine as prescribed    Why is this important?   Atrial fibrillation may have no symptoms. Sometimes the symptoms get worse or happen more  often.  It is important to keep track of what your symptoms are and when they happen.  A change in symptoms is important to discuss with your doctor or nurse.  Being active and healthy eating will also help you manage your heart condition.          This is a list of the screening recommended for you and due dates:  Health Maintenance  Topic Date Due   DTaP/Tdap/Td vaccine (2 - Tdap) 01/30/2019   COVID-19 Vaccine (3 - Pfizer risk series) 02/24/2023*   Zoster (Shingles) Vaccine (1 of 2) 05/11/2023*   Colon Cancer Screening  07/03/2024*   Flu Shot  03/22/2023   Hemoglobin A1C  04/06/2023   Complete foot exam   05/12/2023   Eye exam for diabetics  05/12/2023   Yearly kidney function blood test  for diabetes  10/07/2023   Yearly kidney health urinalysis for diabetes  10/07/2023   Medicare Annual Wellness Visit  02/08/2024   Pneumonia Vaccine  Completed   Hepatitis C Screening  Completed   HPV Vaccine  Aged Out  *Topic was postponed. The date shown is not the original due date.    Advanced directives: Advance directive discussed with you today. Even though you declined this today, please call our office should you change your mind, and we can give you the proper paperwork for you to fill out.   Conditions/risks identified: None  Next appointment: Follow up in one year for your annual wellness visit.   Preventive Care 100 Years and Older, Male  Preventive care refers to lifestyle choices and visits with your health care provider that can promote health and wellness. What does preventive care include? A yearly physical exam. This is also called an annual well check. Dental exams once or twice a year. Routine eye exams. Ask your health care provider how often you should have your eyes checked. Personal lifestyle choices, including: Daily care of your teeth and gums. Regular physical activity. Eating a healthy diet. Avoiding tobacco and drug use. Limiting alcohol use. Practicing  safe sex. Taking low doses of aspirin every day. Taking vitamin and mineral supplements as recommended by your health care provider. What happens during an annual well check? The services and screenings done by your health care provider during your annual well check will depend on your age, overall health, lifestyle risk factors, and family history of disease. Counseling  Your health care provider may ask you questions about your: Alcohol use. Tobacco use. Drug use. Emotional well-being. Home and relationship well-being. Sexual activity. Eating habits. History of falls. Memory and ability to understand (cognition). Work and work Astronomer. Screening  You may have the following tests or measurements: Height, weight, and BMI. Blood pressure. Lipid and cholesterol levels. These may be checked every 5 years, or more frequently if you are over 63 years old. Skin check. Lung cancer screening. You may have this screening every year starting at age 82 if you have a 30-pack-year history of smoking and currently smoke or have quit within the past 15 years. Fecal occult blood test (FOBT) of the stool. You may have this test every year starting at age 73. Flexible sigmoidoscopy or colonoscopy. You may have a sigmoidoscopy every 5 years or a colonoscopy every 10 years starting at age 36. Prostate cancer screening. Recommendations will vary depending on your family history and other risks. Hepatitis C blood test. Hepatitis B blood test. Sexually transmitted disease (STD) testing. Diabetes screening. This is done by checking your blood sugar (glucose) after you have not eaten for a while (fasting). You may have this done every 1-3 years. Abdominal aortic aneurysm (AAA) screening. You may need this if you are a current or former smoker. Osteoporosis. You may be screened starting at age 58 if you are at high risk. Talk with your health care provider about your test results, treatment options, and  if necessary, the need for more tests. Vaccines  Your health care provider may recommend certain vaccines, such as: Influenza vaccine. This is recommended every year. Tetanus, diphtheria, and acellular pertussis (Tdap, Td) vaccine. You may need a Td booster every 10 years. Zoster vaccine. You may need this after age 28. Pneumococcal 13-valent conjugate (PCV13) vaccine. One dose is recommended after age 52. Pneumococcal polysaccharide (PPSV23) vaccine. One dose is recommended after age 76. Talk to your  health care provider about which screenings and vaccines you need and how often you need them. This information is not intended to replace advice given to you by your health care provider. Make sure you discuss any questions you have with your health care provider. Document Released: 09/03/2015 Document Revised: 04/26/2016 Document Reviewed: 06/08/2015 Elsevier Interactive Patient Education  2017 [employer].  Fall Prevention in the Home Falls can cause injuries. They can happen to people of all ages. There are many things you can do to make your home safe and to help prevent falls. What can I do on the outside of my home? Regularly fix the edges of walkways and driveways and fix any cracks. Remove anything that might make you trip as you walk through a door, such as a raised step or threshold. Trim any bushes or trees on the path to your home. Use bright outdoor lighting. Clear any walking paths of anything that might make someone trip, such as rocks or tools. Regularly check to see if handrails are loose or broken. Make sure that both sides of any steps have handrails. Any raised decks and porches should have guardrails on the edges. Have any leaves, snow, or ice cleared regularly. Use sand or salt on walking paths during winter. Clean up any spills in your garage right away. This includes oil or grease spills. What can I do in the bathroom? Use night lights. Install grab bars by the  toilet and in the tub and shower. Do not use towel bars as grab bars. Use non-skid mats or decals in the tub or shower. If you need to sit down in the shower, use a plastic, non-slip stool. Keep the floor dry. Clean up any water that spills on the floor as soon as it happens. Remove soap buildup in the tub or shower regularly. Attach bath mats securely with double-sided non-slip rug tape. Do not have throw rugs and other things on the floor that can make you trip. What can I do in the bedroom? Use night lights. Make sure that you have a light by your bed that is easy to reach. Do not use any sheets or blankets that are too big for your bed. They should not hang down onto the floor. Have a firm chair that has side arms. You can use this for support while you get dressed. Do not have throw rugs and other things on the floor that can make you trip. What can I do in the kitchen? Clean up any spills right away. Avoid walking on wet floors. Keep items that you use a lot in easy-to-reach places. If you need to reach something above you, use a strong step stool that has a grab bar. Keep electrical cords out of the way. Do not use floor polish or wax that makes floors slippery. If you must use wax, use non-skid floor wax. Do not have throw rugs and other things on the floor that can make you trip. What can I do with my stairs? Do not leave any items on the stairs. Make sure that there are handrails on both sides of the stairs and use them. Fix handrails that are broken or loose. Make sure that handrails are as long as the stairways. Check any carpeting to make sure that it is firmly attached to the stairs. Fix any carpet that is loose or worn. Avoid having throw rugs at the top or bottom of the stairs. If you do have throw rugs, attach them to  the floor with carpet tape. Make sure that you have a light switch at the top of the stairs and the bottom of the stairs. If you do not have them, ask someone  to add them for you. What else can I do to help prevent falls? Wear shoes that: Do not have high heels. Have rubber bottoms. Are comfortable and fit you well. Are closed at the toe. Do not wear sandals. If you use a stepladder: Make sure that it is fully opened. Do not climb a closed stepladder. Make sure that both sides of the stepladder are locked into place. Ask someone to hold it for you, if possible. Clearly mark and make sure that you can see: Any grab bars or handrails. First and last steps. Where the edge of each step is. Use tools that help you move around (mobility aids) if they are needed. These include: Canes. Walkers. Scooters. Crutches. Turn on the lights when you go into a dark area. Replace any light bulbs as soon as they burn out. Set up your furniture so you have a clear path. Avoid moving your furniture around. If any of your floors are uneven, fix them. If there are any pets around you, be aware of where they are. Review your medicines with your doctor. Some medicines can make you feel dizzy. This can increase your chance of falling. Ask your doctor what other things that you can do to help prevent falls. This information is not intended to replace advice given to you by your health care provider. Make sure you discuss any questions you have with your health care provider. Document Released: 06/03/2009 Document Revised: 01/13/2016 Document Reviewed: 09/11/2014 Elsevier Interactive Patient Education  2017 [employer].

## 2023-02-14 ENCOUNTER — Telehealth: Payer: Self-pay

## 2023-02-14 NOTE — Telephone Encounter (Signed)
Patient said he filled out paperwork for assistance (Eliquis) in march and spoke to someone last week that was checking into it.

## 2023-02-21 ENCOUNTER — Other Ambulatory Visit: Payer: Self-pay

## 2023-02-21 MED ORDER — APIXABAN 5 MG PO TABS: 5.0000 mg | ORAL_TABLET | Freq: Two times a day (BID) | ORAL | 3 refills | Status: DC

## 2023-02-21 NOTE — Telephone Encounter (Signed)
Spoke with patient and informed him that patient assistance was denied. He must apply for medication assistance to the Extra Help Program. If he denied he must bring denial letter to [place] and we can apply for patient assistance again through [facility]. We MUST include the denial letter in the application for [facility].

## 2023-03-08 ENCOUNTER — Other Ambulatory Visit: Payer: Self-pay

## 2023-03-08 MED ORDER — APIXABAN 5 MG PO TABS: 5.0000 mg | ORAL_TABLET | Freq: Two times a day (BID) | ORAL | 3 refills | Status: DC

## 2023-03-16 ENCOUNTER — Other Ambulatory Visit: Payer: Self-pay | Admitting: Cardiology

## 2023-03-16 ENCOUNTER — Ambulatory Visit: Payer: Medicare Other | Admitting: Cardiology

## 2023-03-16 ENCOUNTER — Encounter: Payer: Self-pay | Admitting: Cardiology

## 2023-03-16 VITALS — BP 137/82 | HR 66 | Resp 16 | Ht 71.0 in | Wt 226.0 lb

## 2023-03-16 DIAGNOSIS — I10 Essential (primary) hypertension: Secondary | ICD-10-CM | POA: Diagnosis not present

## 2023-03-16 DIAGNOSIS — I480 Paroxysmal atrial fibrillation: Secondary | ICD-10-CM

## 2023-03-16 DIAGNOSIS — I2510 Atherosclerotic heart disease of native coronary artery without angina pectoris: Secondary | ICD-10-CM | POA: Diagnosis not present

## 2023-03-16 DIAGNOSIS — R0683 Snoring: Secondary | ICD-10-CM

## 2023-03-16 DIAGNOSIS — I471 Supraventricular tachycardia, unspecified: Secondary | ICD-10-CM

## 2023-03-16 MED ORDER — APIXABAN 5 MG PO TABS
5.0000 mg | ORAL_TABLET | Freq: Two times a day (BID) | ORAL | 3 refills | Status: DC
Start: 2023-03-16 — End: 2023-05-30

## 2023-03-16 NOTE — Progress Notes (Signed)
Follow up visit  Subjective:   Roberto Rivera, male    DOB: 07/07/1951, 72 y.o.   MRN: [id number]     HPI  Chief Complaint  Patient presents with   Coronary Artery Disease   Follow-up    6 month    72 y/o African American melr with hypertension, hyperlipidemia, CAD, PAF, PSVT, h/o stroke  Patient is doing well.  He does not have any palpitations, chest pain symptoms, has occasional shortness of breath when bending down.  He does not know that he is in A-fib today.  He has been compliant with his Eliquis.   Current Outpatient Medications:    Accu-Chek FastClix Lancets MISC, USE TO CHECK BLOOD SUGAR AS DIRECTED ONCE DAILY, Disp: 102 each, Rfl: 3   albuterol (VENTOLIN HFA) 108 (90 Base) MCG/ACT inhaler, Inhale 2 puffs into the lungs every 6 (six) hours as needed for wheezing or shortness of breath. (Patient taking differently: Inhale 2 puffs into the lungs 2 (two) times daily as needed for wheezing or shortness of breath.), Disp: 24 g, Rfl: 3   ammonium lactate (AMLACTIN DAILY) 12 % lotion, Apply 1 Application topically as needed for dry skin., Disp: 400 g, Rfl: 0   ammonium lactate (AMLACTIN) 12 % cream, Apply 1 Application topically as needed for dry skin., Disp: , Rfl:    apixaban (ELIQUIS) 5 MG TABS tablet, Take 1 tablet (5 mg total) by mouth 2 (two) times daily., Disp: 60 tablet, Rfl: 3   Cholecalciferol 50 MCG (2000 UT) TABS, 1 tab by mouth once daily (Patient taking differently: Take 2,000 Units by mouth daily.), Disp: 30 tablet, Rfl: 99   clotrimazole-betamethasone (LOTRISONE) cream, Apply 1 application topically 2 (two) times daily., Disp: 30 g, Rfl: 0   diltiazem (CARDIZEM CD) 180 MG 24 hr capsule, Take 1 capsule (180 mg total) by mouth every evening., Disp: 90 capsule, Rfl: 3   empagliflozin (JARDIANCE) 10 MG TABS tablet, Take 1 tablet (10 mg total) by mouth daily before breakfast., Disp: 90 tablet, Rfl: 3   glucose blood (ACCU-CHEK GUIDE) test strip, 1 each by Other route  daily. To check blood sugars once a day, Disp: 100 strip, Rfl: 3   hydrALAZINE (APRESOLINE) 100 MG tablet, Take 1 tablet (100 mg total) by mouth 3 (three) times daily., Disp: 270 tablet, Rfl: 3   hydrochlorothiazide (HYDRODIURIL) 25 MG tablet, Take 1 tablet (25 mg total) by mouth daily., Disp: 90 tablet, Rfl: 3   isosorbide dinitrate (ISORDIL) 20 MG tablet, Take 1 tablet (20 mg total) by mouth 3 (three) times daily., Disp: 270 tablet, Rfl: 3   metoprolol tartrate (LOPRESSOR) 50 MG tablet, Take 1 tablet (50 mg total) by mouth 2 (two) times daily., Disp: 60 tablet, Rfl: 2   nitroGLYCERIN (NITROSTAT) 0.4 MG SL tablet, Place 1 tablet (0.4 mg total) under the tongue every 5 (five) minutes as needed for chest pain., Disp: 90 tablet, Rfl: 3   potassium chloride (KLOR-CON 10) 10 MEQ tablet, Take 1 tablet (10 mEq total) by mouth daily., Disp: 90 tablet, Rfl: 3   rosuvastatin (CRESTOR) 5 MG tablet, Take 1 tablet (5 mg total) by mouth at bedtime. 1 tab by mouth once daily, Disp: 90 tablet, Rfl: 3   Cardiovascular & other pertient studies:  Reviewed external labs and tests, independently interpreted  EKG 03/16/2023: Atrial fibrillation 65 bpm Cccasional ectopic ventricular beat   LAFB Nonspecific T-abnormality  Echocardiogram 05/16/2022: 1. Left ventricular ejection fraction, by estimation, is 55 to 60%. The  left ventricle has normal function. The left ventricle has no regional  wall motion abnormalities. There is moderate left ventricular hypertrophy.  Left ventricular diastolic  parameters are consistent with Grade II diastolic dysfunction  (pseudonormalization). Elevated left atrial pressure.   2. Right ventricular systolic function is normal. The right ventricular  size is normal. There is normal pulmonary artery systolic pressure.   3. Left atrial size was severely dilated.   4. The mitral valve is normal in structure. No evidence of mitral valve  regurgitation. Mild mitral stenosis. The mean  mitral valve gradient is 4.0  mmHg.   5. The aortic valve is tricuspid. Aortic valve regurgitation is mild to  moderate. Mild aortic valve stenosis. Aortic valve area, by VTI measures  2.33 cm. Aortic valve mean gradient measures 12.0 mmHg. Aortic valve Vmax  measures 2.37 m/s.   6. Aortic dilatation noted. Aneurysm of the ascending aorta, measuring 45  mm.   Comparison(s): A prior study was performed on 07/01/2020. Changes from  prior study are noted. Mild AS, mild MS new. LVH increased from mild to  moderate. Diastolic dysfunction increased from I to II.   Left heart cath and coronary angiography 09/28/2020:  LM: Large, normal LAD: Large, minimal luminal irregularities Ramus: Large, normal LCx: Large, normal RCA:  Prox calcific 50% stenosis, distal RCA subtotal occlusion with TIMI 1 flow  PCV MYOCARDIAL PERFUSION WO LEXISCAN 08/04/2020 Nondiagnostic ECG stress due to pharmacologic stress. Resting EKG demonstrated atrial fibrillation with rapid ventricular response. Non-specific ST-T abnormality. Peak EKG/ demonstrated atrial fibrillation with rapid ventricular response. There is a reversible moderate defect in the lateral and inferior regions. consistent with severe ischemia. Overall LV systolic function is abnormal with regional wall motion abnormalities in the same area.  Stress LV EF: 34%. No previous exam available for comparison. High risk study.   Venous Bilateral Ultrasound 07/02/2020: RIGHT:  - There is no evidence of deep vein thrombosis in the lower extremity.  - No cystic structure found in the popliteal fossa.  LEFT:  - There is no evidence of deep vein thrombosis in the lower extremity.  - No cystic structure found in the popliteal fossa.   ECHOCARDIOGRAM COMPLETE 07/01/2020 1. Left ventricular ejection fraction, by estimation, is 60 to 65%. The left ventricle has normal function. The left ventricle has no regional wall motion abnormalities. There is mild left  ventricular hypertrophy. Left ventricular diastolic parameters are consistent with Grade I diastolic dysfunction (impaired relaxation).   2. Right ventricular systolic function is normal. The right ventricular size is normal. Tricuspid regurgitation signal is inadequate for assessing PA pressure.   3. Left atrial size was severely dilated.   4. The mitral valve is normal in structure. No evidence of mitral valve regurgitation. No evidence of mitral stenosis. There was chordal systolic anterior motion noted (not valvular).  5. The aortic valve is tricuspid. Aortic valve regurgitation is mild. Mild aortic valve stenosis. Aortic valve mean gradient measures 14.0 mmHg.   6. Aortic dilatation noted. There is mild dilatation of the ascending aorta, measuring 40 mm.  7. The inferior vena cava is normal in size with greater than 50% respiratory variability, suggesting right atrial pressure of 3 mmHg.   VAS US CAROTID 07/01/2020 Right Carotid: Velocities in the right ICA are consistent with a 1-39% stenosis.  Left Carotid: Velocities in the left ICA are consistent with a 1-39% stenosis.  Vertebrals:  Bilateral vertebral arteries demonstrate antegrade flow.  Subclavians: Normal flow hemodynamics were seen in bilateral subclavian  arteries.   Recent labs: 10/06/2022: Glucose 119, BUN/Cr 23/1.60. EGFR 43. Na/K 139/3.6. Rest of the CMP normal H/H 13/40. MCV 73. Platelets 247 HbA1C 5.4% Chol 109, TG 60, HDL 43, LDL 54 TSH 1.2 normal   Review of Systems  Cardiovascular:  Positive for dyspnea on exertion. Negative for chest pain, leg swelling, palpitations and syncope.         Vitals:   03/16/23 0839  BP: 137/82  Pulse: 66  Resp: 16  SpO2: 96%      Body mass index is 31.52 kg/m. Filed Weights   03/16/23 0839  Weight: 226 lb (102.5 kg)      Objective:   Physical Exam Vitals and nursing note reviewed.  Constitutional:      General: He is not in acute distress. Neck:     Vascular: No  JVD.  Cardiovascular:     Rate and Rhythm: Normal rate. Rhythm irregular.     Heart sounds: Normal heart sounds. No murmur heard. Pulmonary:     Effort: Pulmonary effort is normal.     Breath sounds: Normal breath sounds. No wheezing or rales.  Musculoskeletal:     Right lower leg: No edema.     Left lower leg: No edema.         Visit diagnoses:   ICD-10-CM   1. Coronary artery disease involving native coronary artery of native heart without angina pectoris  I25.10 EKG 12-Lead    2. PAF (paroxysmal atrial fibrillation) (HCC)  I48.0 Basic metabolic panel    Pro b natriuretic peptide (BNP)[facility])    PCV ECHOCARDIOGRAM COMPLETE    apixaban (ELIQUIS) 5 MG TABS tablet    3. Essential hypertension  I10     4. PSVT (paroxysmal supraventricular tachycardia)  I47.10         Assessment & Recommendations:    72 y/o African American melr with hypertension, hyperlipidemia, CAD, PAF, PSVT, h/o stroke  PAF: In A-fib, no clear symptoms other than mild bendopnea. CHA2DS2-VASc score 5, 7.2%.  Continue Eliquis twice daily. Check echocardiogram and labs.  After that, will proceed with cardioversion.  If he does not sustain sinus rhythm, will will probably treat with rate control approach.  Discussed risk, benefits, alternate options with the patient in detail.  Hypertension: Well-controlled.    F/u after cardioversion     Roberto Negus, MD Pager: [phone] Office: [phone]

## 2023-03-16 NOTE — Progress Notes (Signed)
sl

## 2023-03-22 ENCOUNTER — Ambulatory Visit: Payer: Medicare Other

## 2023-03-22 DIAGNOSIS — I480 Paroxysmal atrial fibrillation: Secondary | ICD-10-CM

## 2023-03-22 DIAGNOSIS — D3132 Benign neoplasm of left choroid: Secondary | ICD-10-CM | POA: Diagnosis not present

## 2023-03-22 DIAGNOSIS — H3563 Retinal hemorrhage, bilateral: Secondary | ICD-10-CM | POA: Diagnosis not present

## 2023-03-22 DIAGNOSIS — H401130 Primary open-angle glaucoma, bilateral, stage unspecified: Secondary | ICD-10-CM | POA: Diagnosis not present

## 2023-03-22 DIAGNOSIS — H35033 Hypertensive retinopathy, bilateral: Secondary | ICD-10-CM | POA: Diagnosis not present

## 2023-03-22 DIAGNOSIS — H353130 Nonexudative age-related macular degeneration, bilateral, stage unspecified: Secondary | ICD-10-CM | POA: Diagnosis not present

## 2023-03-22 DIAGNOSIS — H25813 Combined forms of age-related cataract, bilateral: Secondary | ICD-10-CM | POA: Diagnosis not present

## 2023-03-22 DIAGNOSIS — E113393 Type 2 diabetes mellitus with moderate nonproliferative diabetic retinopathy without macular edema, bilateral: Secondary | ICD-10-CM | POA: Diagnosis not present

## 2023-03-22 LAB — HM DIABETES EYE EXAM

## 2023-03-26 ENCOUNTER — Encounter (INDEPENDENT_AMBULATORY_CARE_PROVIDER_SITE_OTHER): Payer: Medicare Other | Admitting: Ophthalmology

## 2023-03-26 DIAGNOSIS — I10 Essential (primary) hypertension: Secondary | ICD-10-CM | POA: Diagnosis not present

## 2023-03-26 DIAGNOSIS — H2513 Age-related nuclear cataract, bilateral: Secondary | ICD-10-CM | POA: Diagnosis not present

## 2023-03-26 DIAGNOSIS — H43813 Vitreous degeneration, bilateral: Secondary | ICD-10-CM

## 2023-03-26 DIAGNOSIS — Z7984 Long term (current) use of oral hypoglycemic drugs: Secondary | ICD-10-CM

## 2023-03-26 DIAGNOSIS — H353132 Nonexudative age-related macular degeneration, bilateral, intermediate dry stage: Secondary | ICD-10-CM | POA: Diagnosis not present

## 2023-03-26 DIAGNOSIS — H35033 Hypertensive retinopathy, bilateral: Secondary | ICD-10-CM

## 2023-03-26 DIAGNOSIS — E113392 Type 2 diabetes mellitus with moderate nonproliferative diabetic retinopathy without macular edema, left eye: Secondary | ICD-10-CM | POA: Diagnosis not present

## 2023-03-26 DIAGNOSIS — D3132 Benign neoplasm of left choroid: Secondary | ICD-10-CM | POA: Diagnosis not present

## 2023-03-27 ENCOUNTER — Encounter (HOSPITAL_COMMUNITY): Admission: RE | Payer: Self-pay | Source: Home / Self Care

## 2023-03-27 ENCOUNTER — Ambulatory Visit (HOSPITAL_COMMUNITY): Admission: RE | Admit: 2023-03-27 | Payer: Medicare Other | Source: Home / Self Care | Admitting: Cardiology

## 2023-03-27 SURGERY — CARDIOVERSION
Anesthesia: General

## 2023-04-02 ENCOUNTER — Other Ambulatory Visit: Payer: Self-pay | Admitting: Cardiothoracic Surgery

## 2023-04-02 DIAGNOSIS — I7121 Aneurysm of the ascending aorta, without rupture: Secondary | ICD-10-CM

## 2023-04-06 ENCOUNTER — Ambulatory Visit: Payer: Medicare Other | Admitting: Internal Medicine

## 2023-04-11 ENCOUNTER — Telehealth: Payer: Self-pay | Admitting: Internal Medicine

## 2023-04-11 NOTE — Telephone Encounter (Signed)
Patient wants to know why he is going to [facility] specialist.  He wants to know if Dr. Jonny Ruiz can explain this to him.   Please call patient [phone]

## 2023-04-11 NOTE — Telephone Encounter (Signed)
I did not do this, but he was referred July 26 per Dr Rosemary Holms to cardiology, for Atrial Fibrillation .  thanks

## 2023-04-13 ENCOUNTER — Ambulatory Visit: Payer: Medicare Other | Admitting: Cardiology

## 2023-04-17 ENCOUNTER — Other Ambulatory Visit: Payer: Self-pay

## 2023-04-17 ENCOUNTER — Other Ambulatory Visit: Payer: Self-pay | Admitting: Internal Medicine

## 2023-04-18 ENCOUNTER — Ambulatory Visit: Payer: Medicare Other | Admitting: Cardiology

## 2023-04-18 ENCOUNTER — Telehealth: Payer: Self-pay | Admitting: Internal Medicine

## 2023-04-18 ENCOUNTER — Ambulatory Visit: Payer: Medicare Other | Admitting: Podiatry

## 2023-04-18 DIAGNOSIS — I480 Paroxysmal atrial fibrillation: Secondary | ICD-10-CM

## 2023-04-18 MED ORDER — DAPAGLIFLOZIN PROPANEDIOL 5 MG PO TABS: 5.0000 mg | ORAL_TABLET | Freq: Every day | ORAL | 3 refills | Status: DC

## 2023-04-18 NOTE — Progress Notes (Signed)
EKG 04/18/2023: Atrial fibrillation 72 bpm  Nonspecific T-abnormality  Will plan for cardioversion   Elder Negus, MD Pager: [phone] Office: [phone]

## 2023-04-18 NOTE — Telephone Encounter (Signed)
Ok to try change to farxiga 5 mg per day- hopefully this is more affordable thanks

## 2023-04-18 NOTE — Telephone Encounter (Signed)
Pt has ran out of his [place] went to get a refill but the medication is 161 dollars. Pt wanted to know if can get some samples or cheaper medication until upcoming appt. Please advise.

## 2023-04-18 NOTE — Telephone Encounter (Signed)
Patient call concerning his jardiance prescription.  He said that he can not afford it right now.  Patient has a bottle of 25 mg. He wants to know if he can cut the 25 mg. In half until he comes back for his next visit.  Please call patient and let him know  [phone]

## 2023-04-18 NOTE — Progress Notes (Deleted)
Follow up visit  Subjective:   Roberto Rivera, male    DOB: Mar 05, 1951, 72 y.o.   MRN: [id number]     HPI  No chief complaint on file.   72 y/o African American melr with hypertension, hyperlipidemia, CAD, PAF, PSVT, h/o stroke  Patient is doing well.  He does not have any palpitations, chest pain symptoms, has occasional shortness of breath when bending down.  He does not know that he is in A-fib today.  He has been compliant with his Eliquis.   Current Outpatient Medications:    Accu-Chek FastClix Lancets MISC, USE TO CHECK BLOOD SUGAR AS DIRECTED ONCE DAILY, Disp: 102 each, Rfl: 3   albuterol (VENTOLIN HFA) 108 (90 Base) MCG/ACT inhaler, Inhale 2 puffs into the lungs every 6 (six) hours as needed for wheezing or shortness of breath. (Patient taking differently: Inhale 2 puffs into the lungs 2 (two) times daily as needed for wheezing or shortness of breath.), Disp: 24 g, Rfl: 3   ammonium lactate (AMLACTIN DAILY) 12 % lotion, Apply 1 Application topically as needed for dry skin., Disp: 400 g, Rfl: 0   ammonium lactate (AMLACTIN) 12 % cream, Apply 1 Application topically as needed for dry skin., Disp: , Rfl:    apixaban (ELIQUIS) 5 MG TABS tablet, Take 1 tablet (5 mg total) by mouth 2 (two) times daily., Disp: 180 tablet, Rfl: 3   Cholecalciferol 50 MCG (2000 UT) TABS, 1 tab by mouth once daily (Patient taking differently: Take 2,000 Units by mouth daily.), Disp: 30 tablet, Rfl: 99   clotrimazole-betamethasone (LOTRISONE) cream, Apply 1 application topically 2 (two) times daily., Disp: 30 g, Rfl: 0   diltiazem (CARDIZEM CD) 180 MG 24 hr capsule, Take 1 capsule (180 mg total) by mouth every evening., Disp: 90 capsule, Rfl: 3   empagliflozin (JARDIANCE) 10 MG TABS tablet, Take 1 tablet (10 mg total) by mouth daily before breakfast., Disp: 90 tablet, Rfl: 3   glucose blood (ACCU-CHEK GUIDE) test strip, 1 each by Other route daily. To check blood sugars once a day, Disp: 100 strip, Rfl: 3    hydrALAZINE (APRESOLINE) 100 MG tablet, Take 1 tablet (100 mg total) by mouth 3 (three) times daily., Disp: 270 tablet, Rfl: 3   hydrochlorothiazide (HYDRODIURIL) 25 MG tablet, Take 1 tablet (25 mg total) by mouth daily., Disp: 90 tablet, Rfl: 3   isosorbide dinitrate (ISORDIL) 20 MG tablet, Take 1 tablet (20 mg total) by mouth 3 (three) times daily., Disp: 270 tablet, Rfl: 3   metoprolol tartrate (LOPRESSOR) 50 MG tablet, TAKE 1 TABLET(50 MG) BY MOUTH TWICE DAILY, Disp: 60 tablet, Rfl: 2   nitroGLYCERIN (NITROSTAT) 0.4 MG SL tablet, Place 1 tablet (0.4 mg total) under the tongue every 5 (five) minutes as needed for chest pain., Disp: 90 tablet, Rfl: 3   potassium chloride (KLOR-CON 10) 10 MEQ tablet, Take 1 tablet (10 mEq total) by mouth daily., Disp: 90 tablet, Rfl: 3   repaglinide (PRANDIN) 1 MG tablet, Take 1 mg by mouth daily., Disp: , Rfl:    rosuvastatin (CRESTOR) 5 MG tablet, Take 1 tablet (5 mg total) by mouth at bedtime. 1 tab by mouth once daily, Disp: 90 tablet, Rfl: 3   Cardiovascular & other pertient studies:  Reviewed external labs and tests, independently interpreted  EKG 03/16/2023: Atrial fibrillation 65 bpm Cccasional ectopic ventricular beat   LAFB Nonspecific T-abnormality  Echocardiogram 05/16/2022: 1. Left ventricular ejection fraction, by estimation, is 55 to 60%. The  left ventricle  has normal function. The left ventricle has no regional  wall motion abnormalities. There is moderate left ventricular hypertrophy.  Left ventricular diastolic  parameters are consistent with Grade II diastolic dysfunction  (pseudonormalization). Elevated left atrial pressure.   2. Right ventricular systolic function is normal. The right ventricular  size is normal. There is normal pulmonary artery systolic pressure.   3. Left atrial size was severely dilated.   4. The mitral valve is normal in structure. No evidence of mitral valve  regurgitation. Mild mitral stenosis. The mean mitral  valve gradient is 4.0  mmHg.   5. The aortic valve is tricuspid. Aortic valve regurgitation is mild to  moderate. Mild aortic valve stenosis. Aortic valve area, by VTI measures  2.33 cm. Aortic valve mean gradient measures 12.0 mmHg. Aortic valve Vmax  measures 2.37 m/s.   6. Aortic dilatation noted. Aneurysm of the ascending aorta, measuring 45  mm.   Comparison(s): A prior study was performed on 07/01/2020. Changes from  prior study are noted. Mild AS, mild MS new. LVH increased from mild to  moderate. Diastolic dysfunction increased from I to II.   Left heart cath and coronary angiography 09/28/2020:  LM: Large, normal LAD: Large, minimal luminal irregularities Ramus: Large, normal LCx: Large, normal RCA:  Prox calcific 50% stenosis, distal RCA subtotal occlusion with TIMI 1 flow  PCV MYOCARDIAL PERFUSION WO LEXISCAN 08/04/2020 Nondiagnostic ECG stress due to pharmacologic stress. Resting EKG demonstrated atrial fibrillation with rapid ventricular response. Non-specific ST-T abnormality. Peak EKG/ demonstrated atrial fibrillation with rapid ventricular response. There is a reversible moderate defect in the lateral and inferior regions. consistent with severe ischemia. Overall LV systolic function is abnormal with regional wall motion abnormalities in the same area.  Stress LV EF: 34%. No previous exam available for comparison. High risk study.   Venous Bilateral Ultrasound 07/02/2020: RIGHT:  - There is no evidence of deep vein thrombosis in the lower extremity.  - No cystic structure found in the popliteal fossa.  LEFT:  - There is no evidence of deep vein thrombosis in the lower extremity.  - No cystic structure found in the popliteal fossa.   ECHOCARDIOGRAM COMPLETE 07/01/2020 1. Left ventricular ejection fraction, by estimation, is 60 to 65%. The left ventricle has normal function. The left ventricle has no regional wall motion abnormalities. There is mild left ventricular  hypertrophy. Left ventricular diastolic parameters are consistent with Grade I diastolic dysfunction (impaired relaxation).   2. Right ventricular systolic function is normal. The right ventricular size is normal. Tricuspid regurgitation signal is inadequate for assessing PA pressure.   3. Left atrial size was severely dilated.   4. The mitral valve is normal in structure. No evidence of mitral valve regurgitation. No evidence of mitral stenosis. There was chordal systolic anterior motion noted (not valvular).  5. The aortic valve is tricuspid. Aortic valve regurgitation is mild. Mild aortic valve stenosis. Aortic valve mean gradient measures 14.0 mmHg.   6. Aortic dilatation noted. There is mild dilatation of the ascending aorta, measuring 40 mm.  7. The inferior vena cava is normal in size with greater than 50% respiratory variability, suggesting right atrial pressure of 3 mmHg.   VAS US CAROTID 07/01/2020 Right Carotid: Velocities in the right ICA are consistent with a 1-39% stenosis.  Left Carotid: Velocities in the left ICA are consistent with a 1-39% stenosis.  Vertebrals:  Bilateral vertebral arteries demonstrate antegrade flow.  Subclavians: Normal flow hemodynamics were seen in bilateral subclavian arteries.  Recent labs: 10/06/2022: Glucose 119, BUN/Cr 23/1.60. EGFR 43. Na/K 139/3.6. Rest of the CMP normal H/H 13/40. MCV 73. Platelets 247 HbA1C 5.4% Chol 109, TG 60, HDL 43, LDL 54 TSH 1.2 normal   Review of Systems  Cardiovascular:  Positive for dyspnea on exertion. Negative for chest pain, leg swelling, palpitations and syncope.         There were no vitals filed for this visit.     There is no height or weight on file to calculate BMI. There were no vitals filed for this visit.     Objective:   Physical Exam Vitals and nursing note reviewed.  Constitutional:      General: He is not in acute distress. Neck:     Vascular: No JVD.  Cardiovascular:     Rate and  Rhythm: Normal rate. Rhythm irregular.     Heart sounds: Normal heart sounds. No murmur heard. Pulmonary:     Effort: Pulmonary effort is normal.     Breath sounds: Normal breath sounds. No wheezing or rales.  Musculoskeletal:     Right lower leg: No edema.     Left lower leg: No edema.         Visit diagnoses: No diagnosis found.     Assessment & Recommendations:    72 y/o African American melr with hypertension, hyperlipidemia, CAD, PAF, PSVT, h/o stroke  *** PAF: In A-fib, no clear symptoms other than mild bendopnea. CHA2DS2-VASc score 5, 7.2%.  Continue Eliquis twice daily. Check echocardiogram and labs.  After that, will proceed with cardioversion.  If he does not sustain sinus rhythm, will will probably treat with rate control approach.  Discussed risk, benefits, alternate options with the patient in detail.  Hypertension: Well-controlled.    F/u after cardioversion     Elder Negus, MD Pager: [phone] Office: [phone]

## 2023-04-19 NOTE — Telephone Encounter (Signed)
Called and unable to leave voicemail, if Pt calls back please let him know Dr.John has sent in a different medication.

## 2023-04-20 NOTE — Telephone Encounter (Signed)
Pt called back checking the status of his request. Pt is concerned about going into weekend with no medication.

## 2023-04-20 NOTE — Telephone Encounter (Signed)
Called and let Pt know samples have been placed upfront.

## 2023-04-20 NOTE — Telephone Encounter (Signed)
Patient called and said the Roberto Rivera is too expensive too. He would like to know if he can get samples of the Jardiance until his appointment on 05/02/23. Then he would like to discuss what to do at that appointment. Patient would like a call back at [phone].

## 2023-05-02 ENCOUNTER — Ambulatory Visit (INDEPENDENT_AMBULATORY_CARE_PROVIDER_SITE_OTHER): Payer: Medicare Other | Admitting: Internal Medicine

## 2023-05-02 ENCOUNTER — Encounter: Payer: Self-pay | Admitting: Internal Medicine

## 2023-05-02 VITALS — BP 136/84 | HR 72 | Temp 98.3°F | Ht 71.0 in | Wt 223.0 lb

## 2023-05-02 DIAGNOSIS — N1831 Chronic kidney disease, stage 3a: Secondary | ICD-10-CM

## 2023-05-02 DIAGNOSIS — I10 Essential (primary) hypertension: Secondary | ICD-10-CM | POA: Diagnosis not present

## 2023-05-02 DIAGNOSIS — J309 Allergic rhinitis, unspecified: Secondary | ICD-10-CM | POA: Diagnosis not present

## 2023-05-02 DIAGNOSIS — E1121 Type 2 diabetes mellitus with diabetic nephropathy: Secondary | ICD-10-CM

## 2023-05-02 DIAGNOSIS — E559 Vitamin D deficiency, unspecified: Secondary | ICD-10-CM | POA: Diagnosis not present

## 2023-05-02 DIAGNOSIS — Z7984 Long term (current) use of oral hypoglycemic drugs: Secondary | ICD-10-CM

## 2023-05-02 DIAGNOSIS — E1169 Type 2 diabetes mellitus with other specified complication: Secondary | ICD-10-CM

## 2023-05-02 DIAGNOSIS — E785 Hyperlipidemia, unspecified: Secondary | ICD-10-CM | POA: Diagnosis not present

## 2023-05-02 LAB — LIPID PANEL
Cholesterol: 98 mg/dL (ref 0–200)
HDL: 44 mg/dL (ref >39.00)
LDL Cholesterol: 43 mg/dL (ref 0–99)
NonHDL: 54.49
Total CHOL/HDL Ratio: 2
Triglycerides: 58 mg/dL (ref 0.0–149.0)
VLDL: 11.6 mg/dL (ref 0.0–40.0)

## 2023-05-02 LAB — BASIC METABOLIC PANEL
BUN: 22 mg/dL (ref 6–23)
CO2: 31 meq/L (ref 19–32)
Calcium: 9.7 mg/dL (ref 8.4–10.5)
Chloride: 99 meq/L (ref 96–112)
Creatinine, Ser: 1.63 mg/dL — ABNORMAL HIGH (ref 0.40–1.50)
GFR: 42.12 mL/min — ABNORMAL LOW (ref >60.00)
Glucose, Bld: 101 mg/dL — ABNORMAL HIGH (ref 70–99)
Potassium: 3.2 meq/L — ABNORMAL LOW (ref 3.5–5.1)
Sodium: 138 meq/L (ref 135–145)

## 2023-05-02 LAB — HEPATIC FUNCTION PANEL
ALT: 12 U/L (ref 0–53)
AST: 19 U/L (ref 0–37)
Albumin: 3.7 g/dL (ref 3.5–5.2)
Alkaline Phosphatase: 82 U/L (ref 39–117)
Bilirubin, Direct: 0.2 mg/dL (ref 0.0–0.3)
Total Bilirubin: 0.8 mg/dL (ref 0.2–1.2)
Total Protein: 7.1 g/dL (ref 6.0–8.3)

## 2023-05-02 LAB — HEMOGLOBIN A1C: Hgb A1c MFr Bld: 6.4 % (ref 4.6–6.5)

## 2023-05-02 MED ORDER — EMPAGLIFLOZIN 25 MG PO TABS: 25.0000 mg | ORAL_TABLET | Freq: Every day | ORAL | 3 refills | Status: DC

## 2023-05-02 NOTE — Progress Notes (Signed)
Patient ID: Roberto Rivera, male   DOB: 1951-03-11, 72 y.o.   MRN: [id number]        Chief Complaint: follow up HTN, HLD and hyperglycemia , CKD3a, low vit d      HPI:  Roberto Rivera is a 72 y.o. male here overall doing ok,  Pt denies chest pain, increased sob or doe, wheezing, orthopnea, PND, increased LE swelling, palpitations, dizziness or syncope.   Pt denies polydipsia, polyuria, or new focal neuro s/s.    Pt denies fever, wt loss, night sweats, loss of appetite, or other constitutional symptoms    Has bilateral cataracts, and maybe glaucoma though has different opinions per 2 optho Mds per pt.    Lost wt with better diet, very happy gets around much better, no pushing off with getting out of chair.  Does have several wks ongoing nasal allergy symptoms with clearish congestion, itch and sneezing, without fever, pain, ST, cough, swelling or wheezing. Wt Readings from Last 3 Encounters:  05/02/23 223 lb (101.2 kg)  03/16/23 226 lb (102.5 kg)  02/08/23 231 lb (104.8 kg)   BP Readings from Last 3 Encounters:  05/02/23 136/84  03/16/23 137/82  11/16/22 130/88         Past Medical History:  Diagnosis Date   ALLERGIC RHINITIS 04/01/2007   Diabetes mellitus    DIABETES MELLITUS, TYPE II 04/01/2007   ERECTILE DYSFUNCTION, ORGANIC 02/07/2008   Hypercholesteremia    HYPERCHOLESTEROLEMIA 02/07/2008   Hypertension    HYPERTENSION 04/01/2007   MICROCYTOSIS 02/07/2008   OSTEOARTHRITIS, SPINE 02/07/2008   PAF (paroxysmal atrial fibrillation) (HCC) 07/11/2020   Stroke (HCC)    THYROID CYST 02/07/2008   Past Surgical History:  Procedure Laterality Date   cyst removal  01/2021   back   LEFT HEART CATH AND CORONARY ANGIOGRAPHY N/A 09/28/2020   Procedure: LEFT HEART CATH AND CORONARY ANGIOGRAPHY;  Surgeon: Elder Negus, MD;  Location: [facility];  Service: Cardiovascular;  Laterality: N/A;    reports that he quit smoking about 2 years ago. His smoking use included cigarettes. He has never used  smokeless tobacco. He reports that he does not drink alcohol and does not use drugs. family history includes Cancer in his mother; Stroke in his sister. No Known Allergies Current Outpatient Medications on File Prior to Visit  Medication Sig Dispense Refill   Accu-Chek FastClix Lancets MISC USE TO CHECK BLOOD SUGAR AS DIRECTED ONCE DAILY 102 each 3   albuterol (VENTOLIN HFA) 108 (90 Base) MCG/ACT inhaler Inhale 2 puffs into the lungs every 6 (six) hours as needed for wheezing or shortness of breath. (Patient taking differently: Inhale 2 puffs into the lungs 2 (two) times daily as needed for wheezing or shortness of breath.) 24 g 3   ammonium lactate (AMLACTIN DAILY) 12 % lotion Apply 1 Application topically as needed for dry skin. 400 g 0   ammonium lactate (AMLACTIN) 12 % cream Apply 1 Application topically as needed for dry skin.     apixaban (ELIQUIS) 5 MG TABS tablet Take 1 tablet (5 mg total) by mouth 2 (two) times daily. 180 tablet 3   brimonidine (ALPHAGAN) 0.2 % ophthalmic solution 1 drop 2 (two) times daily.     Cholecalciferol 50 MCG (2000 UT) TABS 1 tab by mouth once daily (Patient taking differently: Take 2,000 Units by mouth daily.) 30 tablet 99   clotrimazole-betamethasone (LOTRISONE) cream Apply 1 application topically 2 (two) times daily. 30 g 0   diltiazem (CARDIZEM CD)  180 MG 24 hr capsule Take 1 capsule (180 mg total) by mouth every evening. 90 capsule 3   dorzolamide (TRUSOPT) 2 % ophthalmic solution 1 drop 2 (two) times daily.     glucose blood (ACCU-CHEK GUIDE) test strip 1 each by Other route daily. To check blood sugars once a day 100 strip 3   hydrALAZINE (APRESOLINE) 100 MG tablet Take 1 tablet (100 mg total) by mouth 3 (three) times daily. 270 tablet 3   hydrochlorothiazide (HYDRODIURIL) 25 MG tablet Take 1 tablet (25 mg total) by mouth daily. 90 tablet 3   isosorbide dinitrate (ISORDIL) 20 MG tablet Take 1 tablet (20 mg total) by mouth 3 (three) times daily. 270 tablet 3    latanoprost (XALATAN) 0.005 % ophthalmic solution 1 drop at bedtime.     metoprolol tartrate (LOPRESSOR) 50 MG tablet TAKE 1 TABLET(50 MG) BY MOUTH TWICE DAILY 60 tablet 2   potassium chloride (KLOR-CON 10) 10 MEQ tablet Take 1 tablet (10 mEq total) by mouth daily. 90 tablet 3   repaglinide (PRANDIN) 1 MG tablet Take 1 mg by mouth daily.     rosuvastatin (CRESTOR) 5 MG tablet Take 1 tablet (5 mg total) by mouth at bedtime. 1 tab by mouth once daily 90 tablet 3   nitroGLYCERIN (NITROSTAT) 0.4 MG SL tablet Place 1 tablet (0.4 mg total) under the tongue every 5 (five) minutes as needed for chest pain. 90 tablet 3   No current facility-administered medications on file prior to visit.        ROS:  All others reviewed and negative.  Objective        PE:  BP 136/84 (BP Location: Left Arm, Patient Position: Sitting, Cuff Size: Normal)   Pulse 72   Temp 98.3 F (36.8 C) (Oral)   Ht 5\' 11"  (1.803 m)   Wt 223 lb (101.2 kg)   SpO2 97%   BMI 31.10 kg/m                 Constitutional: Pt appears in NAD               HENT: Head: NCAT.                Right Ear: External ear normal.                 Left Ear: External ear normal.                Eyes: . Pupils are equal, round, and reactive to light. Conjunctivae and EOM are normal               Nose: without d/c or deformity               Neck: Neck supple. Gross normal ROM               Cardiovascular: Normal rate and regular rhythm.                 Pulmonary/Chest: Effort normal and breath sounds without rales or wheezing.                Abd:  Soft, NT, ND, + BS, no organomegaly               Neurological: Pt is alert. At baseline orientation, motor grossly intact               Skin: Skin is warm. No rashes, no other new lesions, LE edema - none  Psychiatric: Pt behavior is normal without agitation   Micro: none  Cardiac tracings I have personally interpreted today:  none  Pertinent Radiological findings (summarize): none    Lab Results  Component Value Date   WBC 7.7 10/06/2022   HGB 12.9 (L) 10/06/2022   HCT 40.3 10/06/2022   PLT 247.0 10/06/2022   GLUCOSE 101 (H) 05/02/2023   CHOL 98 05/02/2023   TRIG 58.0 05/02/2023   HDL 44.00 05/02/2023   LDLCALC 43 05/02/2023   ALT 12 05/02/2023   AST 19 05/02/2023   NA 138 05/02/2023   K 3.2 (L) 05/02/2023   CL 99 05/02/2023   CREATININE 1.63 (H) 05/02/2023   BUN 22 05/02/2023   CO2 31 05/02/2023   TSH 1.20 10/06/2022   PSA 2.65 10/06/2022   INR 1.0 06/30/2020   HGBA1C 6.4 05/02/2023   MICROALBUR 10.6 (H) 10/06/2022   Assessment/Plan:  Roberto Rivera is a 72 y.o. Black or African American [2] male with  has a past medical history of ALLERGIC RHINITIS (04/01/2007), Diabetes mellitus, DIABETES MELLITUS, TYPE II (04/01/2007), ERECTILE DYSFUNCTION, ORGANIC (02/07/2008), Hypercholesteremia, HYPERCHOLESTEROLEMIA (02/07/2008), Hypertension, HYPERTENSION (04/01/2007), MICROCYTOSIS (02/07/2008), OSTEOARTHRITIS, SPINE (02/07/2008), PAF (paroxysmal atrial fibrillation) (HCC) (07/11/2020), Stroke (HCC), and THYROID CYST (02/07/2008).  CKD (chronic kidney disease) stage 3, GFR 30-59 ml/min (HCC) Lab Results  Component Value Date   CREATININE 1.63 (H) 05/02/2023   cont to avoid nephrotoxins, but to increase the jardiance to 25 mg qd   Diabetes (HCC) Lab Results  Component Value Date   HGBA1C 6.4 05/02/2023   Stable, pt to continue current medical treatment jardiance 25 every day, prandin 1 mg qd    Essential hypertension BP Readings from Last 3 Encounters:  05/02/23 136/84  03/16/23 137/82  11/16/22 130/88   Stable, pt to continue medical treatment card cd 180 every day, hydralazine 100 tid, lopressor 50 bid   Hyperlipidemia associated with type 2 diabetes mellitus (HCC) Lab Results  Component Value Date   LDLCALC 43 05/02/2023   Stable, pt to continue current statin crestor 5 qd   Vitamin D deficiency Last vitamin D Lab Results  Component Value Date    VD25OH 61.36 10/06/2022   Stable, cont oral replacement   Allergic rhinitis Mild to mod, for add otc nasacort asd,  to f/u any worsening symptoms or concerns Followup: Return in about 6 months (around 10/30/2023).  Oliver Barre, MD 05/05/2023 7:28 PM  [facility] - [facility]

## 2023-05-02 NOTE — Patient Instructions (Addendum)
Please consider having your Tdap tetanus shot done at the pharmacy  Ok to increase the Jardiance to 25 mg per day (after you finish the 10 mg that you have)  Your form for the patient assist is filled out today  We will try to get the Eliquis samples  Please continue all other medications as before, and refills have been done if requested.  Please have the pharmacy call with any other refills you may need.  Please continue your efforts at being more active, low cholesterol diet, and weight control.  You are otherwise up to date with prevention measures today.  Please keep your appointments with your specialists as you may have planned - cardiology soon, and the Eye doctor f/u for possible glaucoma and cataract in Feb 2025 Dr Roberto Rivera  Please go to the LAB at the blood drawing area for the tests to be done  You will be contacted by phone if any changes need to be made immediately.  Otherwise, you will receive a letter about your results with an explanation, but please check with MyChart first.  Please remember to sign up for MyChart if you have not done so, as this will be important to you in the future with finding out test results, communicating by private email, and scheduling acute appointments online when needed.  Please make an Appointment to return in 6 months, or sooner if needed

## 2023-05-05 ENCOUNTER — Encounter: Payer: Self-pay | Admitting: Internal Medicine

## 2023-05-05 NOTE — Assessment & Plan Note (Signed)
Lab Results  Component Value Date   LDLCALC 43 05/02/2023   Stable, pt to continue current statin crestor 5 qd

## 2023-05-05 NOTE — Assessment & Plan Note (Signed)
Lab Results  Component Value Date   HGBA1C 6.4 05/02/2023   Stable, pt to continue current medical treatment jardiance 25 every day, prandin 1 mg qd

## 2023-05-05 NOTE — Assessment & Plan Note (Signed)
Mild to mod, for add otc nasacort asd,  to f/u any worsening symptoms or concerns

## 2023-05-05 NOTE — Assessment & Plan Note (Signed)
BP Readings from Last 3 Encounters:  05/02/23 136/84  03/16/23 137/82  11/16/22 130/88   Stable, pt to continue medical treatment card cd 180 every day, hydralazine 100 tid, lopressor 50 bid

## 2023-05-05 NOTE — Assessment & Plan Note (Signed)
Last vitamin D Lab Results  Component Value Date   VD25OH 61.36 10/06/2022   Stable, cont oral replacement

## 2023-05-05 NOTE — Assessment & Plan Note (Addendum)
Lab Results  Component Value Date   CREATININE 1.63 (H) 05/02/2023   cont to avoid nephrotoxins, but to increase the jardiance to 25 mg qd

## 2023-05-09 ENCOUNTER — Institutional Professional Consult (permissible substitution): Payer: Medicare Other | Admitting: Neurology

## 2023-05-18 DIAGNOSIS — H353130 Nonexudative age-related macular degeneration, bilateral, stage unspecified: Secondary | ICD-10-CM | POA: Diagnosis not present

## 2023-05-18 DIAGNOSIS — H25813 Combined forms of age-related cataract, bilateral: Secondary | ICD-10-CM | POA: Diagnosis not present

## 2023-05-18 DIAGNOSIS — H35033 Hypertensive retinopathy, bilateral: Secondary | ICD-10-CM | POA: Diagnosis not present

## 2023-05-18 DIAGNOSIS — D3132 Benign neoplasm of left choroid: Secondary | ICD-10-CM | POA: Diagnosis not present

## 2023-05-18 DIAGNOSIS — H3563 Retinal hemorrhage, bilateral: Secondary | ICD-10-CM | POA: Diagnosis not present

## 2023-05-18 DIAGNOSIS — E113393 Type 2 diabetes mellitus with moderate nonproliferative diabetic retinopathy without macular edema, bilateral: Secondary | ICD-10-CM | POA: Diagnosis not present

## 2023-05-18 DIAGNOSIS — H401132 Primary open-angle glaucoma, bilateral, moderate stage: Secondary | ICD-10-CM | POA: Diagnosis not present

## 2023-05-21 ENCOUNTER — Ambulatory Visit: Payer: Medicare Other | Admitting: Cardiothoracic Surgery

## 2023-05-21 ENCOUNTER — Ambulatory Visit
Admission: RE | Admit: 2023-05-21 | Discharge: 2023-05-21 | Disposition: A | Discharge status: Home / Self Care | Payer: Medicare Other | Source: Ambulatory Visit | Attending: Cardiothoracic Surgery | Admitting: Cardiothoracic Surgery

## 2023-05-21 DIAGNOSIS — I7121 Aneurysm of the ascending aorta, without rupture: Secondary | ICD-10-CM | POA: Diagnosis not present

## 2023-05-21 DIAGNOSIS — I700 Atherosclerosis of aorta: Secondary | ICD-10-CM | POA: Diagnosis not present

## 2023-05-21 MED ORDER — IOPAMIDOL (ISOVUE-370) INJECTION 76%
200.0000 mL | Freq: Once | INTRAVENOUS | Status: AC | PRN
  Administered 2023-05-21: 60 mL via INTRAVENOUS

## 2023-05-24 ENCOUNTER — Telehealth: Payer: Self-pay | Admitting: *Deleted

## 2023-05-24 NOTE — Telephone Encounter (Signed)
Pharmacy please advise on holding Eliquis prior to CATARACT LEFT EYE WITH GONIOTOMY  scheduled for TBD. Thank you.

## 2023-05-24 NOTE — Telephone Encounter (Signed)
To my knowledge, he never had cardioversion, so okay to hold 2 days before procedure, if preferred by the operator. If not required by the operator, can continue.   Thanks MJP

## 2023-05-24 NOTE — Telephone Encounter (Signed)
Pre-operative Risk Assessment    Patient Name: Roberto Rivera  DOB: [birth year] MRN: [id number]    DATE OF LAST VISIT: 04/18/23 DR. PATWARDHAN DATE OF NEXT VISIT: NONE  Request for Surgical Clearance    Procedure:   CATARACT LEFT EYE WITH GONIOTOMY  Date of Surgery:  Clearance 07/05/23                                 Surgeon:  DR. Dione Booze Surgeon's Group or Practice Name:  [facility] Phone number:  [phone] Fax number:  [phone]   Type of Clearance Requested:   - Medical  - Pharmacy:  Hold Apixaban (Eliquis)     Type of Anesthesia:  MAC   Additional requests/questions:    Elpidio Anis   05/24/2023, 12:03 PM

## 2023-05-24 NOTE — Telephone Encounter (Signed)
Dr. Rosemary Holms , patient's chart was reviewed for preoperative cardiac evaluation.  He/she was seen by you on 04/17/2023 and according to protocol, we request that you comment on cardiac risk for upcoming procedure since office visit was less than 2 months ago. Reaching out to pharmacy for recommendations on Eliquis.    Please route your response to p cv div preop.  Thank you, Marcelino Duster

## 2023-05-24 NOTE — Telephone Encounter (Signed)
Patient Name: Roberto Rivera  DOB: 07-Nov-1950 MRN: [id number]  Primary Cardiologist: Dr. Rosemary Holms   Chart reviewed as part of pre-operative protocol coverage. Given past medical history and time since last visit, based on ACC/AHA guidelines, Roberto Rivera is at acceptable risk for the planned procedure without further cardiovascular testing.   Per. Dr. Rosemary Holms on 05/24/2023 "To my knowledge, he never had cardioversion, so okay to hold 2 days before procedure, if preferred by the operator. If not required by the operator, can continue."    Per office protocol, if patient is without any new symptoms or concerns at the time of their virtual visit, he/she may hold Elilquis for 2 days prior to procedure. Please resume Eliquis as soon as possible postprocedure, at the discretion of the surgeon.    The patient was advised that if he develops new symptoms prior to surgery to contact our office to arrange for a follow-up visit, and he verbalized understanding.  I will route this recommendation to the requesting party via Epic fax function and remove from pre-op pool.  Please call with questions.  Joni Reining, NP 05/24/2023, 2:40 PM

## 2023-05-28 ENCOUNTER — Encounter: Payer: Self-pay | Admitting: Cardiothoracic Surgery

## 2023-05-28 ENCOUNTER — Ambulatory Visit: Payer: Medicare Other | Admitting: Cardiothoracic Surgery

## 2023-05-28 VITALS — BP 121/77 | HR 65 | Resp 18 | Ht 71.0 in | Wt 226.0 lb

## 2023-05-28 DIAGNOSIS — M314 Aortic arch syndrome [Takayasu]: Secondary | ICD-10-CM | POA: Insufficient documentation

## 2023-05-28 DIAGNOSIS — I7121 Aneurysm of the ascending aorta, without rupture: Secondary | ICD-10-CM | POA: Diagnosis not present

## 2023-05-28 NOTE — Progress Notes (Signed)
HPI: The patient presents for scheduled visit with CTA for 4-month follow-up of recently diagnosed asymptomatic fusiform ascending aneurysm measuring at 4.0-4.5 cm. It was found as an incidental finding when he had a cardiac catheterization in 2023 by Dr. Rosemary Holms which demonstrated single-vessel coronary disease for which she is receiving medical therapy.  A recent 2D echocardiogram showed a trileaflet aortic valve with normal LV function and mild AI.  Since his last visit he has had no new chest pain or any other symptoms of cardiac disease.  The patient is on Eliquis for history of paroxysmal A-fib.  I personally reviewed the images of his CTA performed last week which demonstrated a stable 4.1 cm fusiform ascending aneurysm without mural thickening or penetrating ulceration.  The descending thoracic aorta appears normal.  There are no at risk pulmonary nodules.  There is a stable 1.9 cm cystic structure close to the hiatus which is probably an enlarged lymphatic.  Current Outpatient Medications  Medication Sig Dispense Refill   Accu-Chek FastClix Lancets MISC USE TO CHECK BLOOD SUGAR AS DIRECTED ONCE DAILY 102 each 3   albuterol (VENTOLIN HFA) 108 (90 Base) MCG/ACT inhaler Inhale 2 puffs into the lungs every 6 (six) hours as needed for wheezing or shortness of breath. (Patient taking differently: Inhale 2 puffs into the lungs 2 (two) times daily as needed for wheezing or shortness of breath.) 24 g 3   ammonium lactate (AMLACTIN DAILY) 12 % lotion Apply 1 Application topically as needed for dry skin. 400 g 0   ammonium lactate (AMLACTIN) 12 % cream Apply 1 Application topically as needed for dry skin.     apixaban (ELIQUIS) 5 MG TABS tablet Take 1 tablet (5 mg total) by mouth 2 (two) times daily. 180 tablet 3   brimonidine (ALPHAGAN) 0.2 % ophthalmic solution 1 drop 2 (two) times daily.     Cholecalciferol 50 MCG (2000 UT) TABS 1 tab by mouth once daily (Patient taking differently: Take 2,000  Units by mouth daily.) 30 tablet 99   clotrimazole-betamethasone (LOTRISONE) cream Apply 1 application topically 2 (two) times daily. 30 g 0   diltiazem (CARDIZEM CD) 180 MG 24 hr capsule Take 1 capsule (180 mg total) by mouth every evening. 90 capsule 3   dorzolamide (TRUSOPT) 2 % ophthalmic solution 1 drop 2 (two) times daily.     empagliflozin (JARDIANCE) 25 MG TABS tablet Take 1 tablet (25 mg total) by mouth daily before breakfast. 90 tablet 3   glucose blood (ACCU-CHEK GUIDE) test strip 1 each by Other route daily. To check blood sugars once a day 100 strip 3   hydrALAZINE (APRESOLINE) 100 MG tablet Take 1 tablet (100 mg total) by mouth 3 (three) times daily. 270 tablet 3   hydrochlorothiazide (HYDRODIURIL) 25 MG tablet Take 1 tablet (25 mg total) by mouth daily. 90 tablet 3   isosorbide dinitrate (ISORDIL) 20 MG tablet Take 1 tablet (20 mg total) by mouth 3 (three) times daily. 270 tablet 3   latanoprost (XALATAN) 0.005 % ophthalmic solution 1 drop at bedtime.     metoprolol tartrate (LOPRESSOR) 50 MG tablet TAKE 1 TABLET(50 MG) BY MOUTH TWICE DAILY 60 tablet 2   potassium chloride (KLOR-CON 10) 10 MEQ tablet Take 1 tablet (10 mEq total) by mouth daily. 90 tablet 3   repaglinide (PRANDIN) 1 MG tablet Take 1 mg by mouth daily.     rosuvastatin (CRESTOR) 5 MG tablet Take 1 tablet (5 mg total) by mouth at bedtime. 1 tab by  mouth once daily 90 tablet 3   nitroGLYCERIN (NITROSTAT) 0.4 MG SL tablet Place 1 tablet (0.4 mg total) under the tongue every 5 (five) minutes as needed for chest pain. 90 tablet 3   No current facility-administered medications for this visit.     Physical Exam: Blood pressure 121/77, pulse 65, resp. rate 18, height 5\' 11"  (1.803 m), weight 226 lb (102.5 kg), SpO2 96%.         Exam    General- alert and comfortable    Neck- no JVD, no cervical adenopathy palpable, no carotid bruit   Lungs- clear without rales, wheezes   Cor- regular rate and rhythm, no murmur ,  gallop   Abdomen- soft, non-tender   Extremities - warm, non-tender, minimal edema   Neuro- oriented, appropriate, no focal weakness  Diagnostic Tests: CTA shows a smooth fusiform ascending aortic aneurysm measuring at 4.1 cm.  Impression: Patient has a stable, asymptomatic low risk 4.1 cm dilatation of the ascending thoracic aorta.  He does not meet criteria for surgery which is  a 5-5.5 cm diameter.  Best therapy is blood pressure control to keep systolic blood pressure less than 140 and serial  annual surveillance scans.  Plan: Patient will return with a CTA of his thoracic aorta in 1 year for surveillance of the 4.1 cm TAA.   Lovett Sox, MD [facility] [phone]

## 2023-05-30 ENCOUNTER — Encounter: Payer: Self-pay | Admitting: Cardiology

## 2023-05-30 ENCOUNTER — Ambulatory Visit: Payer: Medicare Other | Attending: Cardiology | Admitting: Cardiology

## 2023-05-30 VITALS — BP 112/80 | HR 66 | Ht 71.0 in | Wt 225.0 lb

## 2023-05-30 DIAGNOSIS — I7121 Aneurysm of the ascending aorta, without rupture: Secondary | ICD-10-CM | POA: Diagnosis not present

## 2023-05-30 DIAGNOSIS — I4819 Other persistent atrial fibrillation: Secondary | ICD-10-CM | POA: Diagnosis not present

## 2023-05-30 MED ORDER — APIXABAN 5 MG PO TABS: 5.0000 mg | ORAL_TABLET | Freq: Two times a day (BID) | ORAL | Status: DC

## 2023-05-30 MED ORDER — METOPROLOL TARTRATE 50 MG PO TABS: 50.0000 mg | ORAL_TABLET | Freq: Two times a day (BID) | ORAL | 3 refills | Status: DC

## 2023-05-30 MED ORDER — DILTIAZEM HCL ER COATED BEADS 180 MG PO CP24: 180.0000 mg | ORAL_CAPSULE | Freq: Every evening | ORAL | 3 refills | Status: DC

## 2023-05-30 MED ORDER — APIXABAN 5 MG PO TABS
5.0000 mg | ORAL_TABLET | Freq: Two times a day (BID) | ORAL | 3 refills | Status: DC
Start: 2023-05-30 — End: 2023-05-30

## 2023-05-30 MED ORDER — APIXABAN 5 MG PO TABS
5.0000 mg | ORAL_TABLET | Freq: Two times a day (BID) | ORAL | 3 refills | Status: DC
Start: 2023-05-30 — End: 2024-06-26

## 2023-05-30 NOTE — Addendum Note (Signed)
Addended by: Macie Burows on: 05/30/2023 11:22 AM   Modules accepted: Orders

## 2023-05-30 NOTE — Progress Notes (Signed)
Cardiology Office Note:  .   Date:  05/30/2023  ID:  Roberto Rivera, DOB [birth year], MRN [id number] PCP: Corwin Levins, MD  [facility] Providers Cardiologist:  Truett Mainland, MD PCP: Corwin Levins, MD  Chief Complaint  Patient presents with   Atrial Fibrillation      History of Present Illness: .    Roberto Rivera is a 72 y.o. male with hypertension, hyperlipidemia, CAD, PAF, PSVT, h/o stroke, asymptomatic fusiform ascending aneurysm measuring at 4.0-4.5 cm.  He was recommended cardioversion at the last visit, but it does not appear that he underwent the same.  He has no symptoms from A-fib at this time, he does not want to do the procedure due to concern for high co-pay.  Patient has an upcoming left eye cataract surgery with goniotomy.  Vitals:   05/30/23 1032 05/30/23 1055  BP: (!) 124/90 112/80  Pulse: 66   SpO2: 97%      ROS:  Review of Systems  Cardiovascular:  Negative for chest pain, dyspnea on exertion, leg swelling, palpitations and syncope.     Studies Reviewed: Marland Kitchen       CTA chest 05/21/2023: Unchanged size and configuration of the ascending aorta, estimated 4.1 cm on the current CT. Aortic aneurysm NOS (ICD10-I71.9).   Unchanged appearance of anterior mediastinal soft tissue lesion, as well as the right retrocrural lesion at the diaphragm. As previously noted, this could represent separately low-grade thymic tumor as well as benign enlargement of the lymph system/cisterna chyli, or alternatively (though less likely given the relative stability) lymphoproliferative disorder.   Coronary artery disease and aortic atherosclerosis. Aortic Atherosclerosis (ICD10-I70.0).  EKG 04/18/2023: Atrial fibrillation 72 bpm  Nonspecific T-abnormality  Echocardiogram 03/22/2023:  Left ventricle cavity is normal in size. Moderate concentric hypertrophy  of the left ventricle, along with foal basal septal hypertrophy Normal  global wall motion. Normal LV systolic  function with EF 60%. Unable to  evaluate diastolic function due to atrial fibrillation.  Ascending aortic aneurysm measuring 4.2 cm.  Left atrial cavity is severely dilated at 62.9 ml/m^2.  Trileaflet aortic valve. Moderate aortic valve leaflet calcification. Mild  aortic stenosis. Vmax 2.4 m/sec, mean PG 10 mmHg, AVA 2.1 cm by  continuity equation. Moderate (Grade II) aortic regurgitation.  Structurally normal mitral valve. Mild (Grade I) mitral regurgitation.  Mild tricuspid regurgitation. Estimated pulmonary artery systolic pressure  46 mmHg.  Compared to previous study on 04/26/2022, Afib, mild PH are new. Ascending  aorta reported 4.5 cm then.    Coronary angiogram 09/2020: LM: Large, normal LAD: Large, minimal luminal irregularities Ramus: Large, normal LCx: Large, normal RCA:  Prox calcific 50% stenosis, distal RCA subtotal occlusion with TIMI 1 flow       Risk Assessment/Calculations:   See below re: A-fib   Physical Exam:   Physical Exam Vitals and nursing note reviewed.  Constitutional:      General: He is not in acute distress. Neck:     Vascular: No JVD.  Cardiovascular:     Rate and Rhythm: Normal rate. Rhythm irregular.     Heart sounds: Normal heart sounds. No murmur heard. Pulmonary:     Effort: Pulmonary effort is normal.     Breath sounds: Normal breath sounds. No wheezing or rales.  Musculoskeletal:     Right lower leg: No edema.     Left lower leg: No edema.      VISIT DIAGNOSES:   ICD-10-CM   1. Aneurysm of ascending aorta without rupture (  HCC)  I71.21     2. Persistent atrial fibrillation (HCC)  I48.19 apixaban (ELIQUIS) 5 MG TABS tablet       ASSESSMENT AND PLAN: .    Roberto Rivera is a 72 y.o. male with hypertension, hyperlipidemia, CAD, PAF, PSVT, h/o stroke, asymptomatic fusiform ascending aneurysm measuring at 4.0-4.5 cm.  Persistent A-fib: Initially diagnosed in August 2024, seems to be persistent. He does not have any overt  symptoms from A-fib, rate is controlled. Patient prefers to continue rate control approach.  This is reasonable.  Continue metoprolol and diltiazem. I discussed with the patient regarding sleep study.  He wants to hold off at this time, and will call us back after his surgery if he would be interested. CHA2DS2-VASc score 5, 7.2%.  Continue Eliquis 5 mg twice daily. With upcoming cataract and goniotomy surgery, okay to hold Eliquis for 2, or up to 3 days as deemed necessary by the surgeon.  Low cardiac risk for this upcoming surgery.  Hypertension: Controlled.  Ascending aorta aneurysm: 4.2 cm.  Continue metoprolol.  Seen by Dr. Maren Beach.  Has plans for annual CT surveillance.  Meds ordered this encounter  Medications   apixaban (ELIQUIS) 5 MG TABS tablet    Sig: Take 1 tablet (5 mg total) by mouth 2 (two) times daily.    Dispense:  180 tablet    Refill:  3   diltiazem (CARDIZEM CD) 180 MG 24 hr capsule    Sig: Take 1 capsule (180 mg total) by mouth every evening.    Dispense:  90 capsule    Refill:  3   metoprolol tartrate (LOPRESSOR) 50 MG tablet    Sig: Take 1 tablet (50 mg total) by mouth 2 (two) times daily.    Dispense:  180 tablet    Refill:  3     F/u in 1 year  Signed, Elder Negus, MD

## 2023-05-30 NOTE — Patient Instructions (Signed)
Medication Instructions:  Your physician recommends that you continue on your current medications as directed. Please refer to the Current Medication list given to you today. We have provided you with 2 weeks of Eliquis 5 mg samples and information for patient assistance.  *If you need a refill on your cardiac medications before your next appointment, please call your pharmacy*   Lab Work: NONE  If you have labs (blood work) drawn today and your tests are completely normal, you will receive your results only by: MyChart Message (if you have MyChart) OR A paper copy in the mail If you have any lab test that is abnormal or we need to change your treatment, we will call you to review the results.   Testing/Procedures: NONE   Follow-Up: At [facility], you and your health needs are our priority.  As part of our continuing mission to provide you with exceptional heart care, we have created designated Provider Care Teams.  These Care Teams include your primary Cardiologist (physician) and Advanced Practice Providers (APPs -  Physician Assistants and Nurse Practitioners) who all work together to provide you with the care you need, when you need it.  We recommend signing up for the patient portal called "MyChart".  Sign up information is provided on this After Visit Summary.  MyChart is used to connect with patients for Virtual Visits (Telemedicine).  Patients are able to view lab/test results, encounter notes, upcoming appointments, etc.  Non-urgent messages can be sent to your provider as well.   To learn more about what you can do with MyChart, go to [email or web address].    Your next appointment:   1 year(s)  Provider:   Elder Negus, MD

## 2023-06-08 ENCOUNTER — Encounter: Payer: Self-pay | Admitting: Pharmacist

## 2023-06-08 NOTE — Progress Notes (Signed)
Pharmacy Quality Measure Review  This patient is appearing on a report for being at risk of failing the adherence measure for cholesterol (statin) medications this calendar year.   Medication: rosuvastatin 5 mg  Last fill date: 05/30/2023 for 90 day supply  Insurance report was not up to date. No action needed at this time.   Arbutus Leas, PharmD, BCPS [facility] [phone]

## 2023-06-26 ENCOUNTER — Telehealth: Payer: Self-pay | Admitting: Cardiology

## 2023-06-26 ENCOUNTER — Telehealth: Payer: Self-pay | Admitting: Internal Medicine

## 2023-06-26 NOTE — Telephone Encounter (Signed)
Patient wants to know if you have any jardiance samples.  He can not afford the medication - please call and advise - [phone]

## 2023-06-26 NOTE — Telephone Encounter (Signed)
Patient calling the office for samples of medication:   1.  What medication and dosage are you requesting samples for? apixaban (ELIQUIS) 5 MG TABS tablet   2.  Are you currently out of this medication? Yes, he took he last one this morning.  He will for tonight.

## 2023-06-27 MED ORDER — APIXABAN 5 MG PO TABS: 5.0000 mg | ORAL_TABLET | Freq: Two times a day (BID) | ORAL | Status: DC

## 2023-06-27 NOTE — Telephone Encounter (Signed)
Patient picked up samples of jardiance and eliquis.

## 2023-06-27 NOTE — Telephone Encounter (Signed)
Yes, patient can have Eliquis 5mg  samples and patient assistance applicaiton

## 2023-06-27 NOTE — Telephone Encounter (Signed)
Patient called stating he hasn't taken any Eliquis any since yesterday morning.  He would like to know if he would be getting any samples. Please advise.

## 2023-06-27 NOTE — Telephone Encounter (Signed)
Patient given samples and had paper work.

## 2023-07-21 ENCOUNTER — Other Ambulatory Visit: Payer: Self-pay | Admitting: Internal Medicine

## 2023-07-23 ENCOUNTER — Other Ambulatory Visit: Payer: Self-pay | Admitting: Internal Medicine

## 2023-07-23 ENCOUNTER — Telehealth: Payer: Self-pay | Admitting: Internal Medicine

## 2023-07-23 NOTE — Telephone Encounter (Signed)
Prescription Request  07/23/2023  LOV: 05/02/2023 Pt is completely out of medication   What is the name of the medication or equipment? metoprolol tartrate (LOPRESSOR) 50 MG tablet   Have you contacted your pharmacy to request a refill? No   Which pharmacy would you like this sent to?  [id number] - [place], [place] - [address] AT [facility] [address] [place] [address] Phone: [phone] Fax: [phone]    Patient notified that their request is being sent to the clinical staff for review and that they should receive a response within 2 business days.   Please advise at Mobile [phone] (mobile)

## 2023-07-23 NOTE — Telephone Encounter (Signed)
/  Pt needs to contact pharmacy a year supply was sent in on 05/30/23 by another provider.

## 2023-07-24 ENCOUNTER — Other Ambulatory Visit: Payer: Self-pay | Admitting: Internal Medicine

## 2023-07-24 ENCOUNTER — Telehealth: Payer: Self-pay | Admitting: Cardiology

## 2023-07-24 NOTE — Telephone Encounter (Signed)
Will send this message to medication assistance team to make them aware that samples of eliquis were left as well as an application for eliquis pt assistance, so that they can further manage this going forward for the pt.

## 2023-07-24 NOTE — Telephone Encounter (Signed)
Tried calling the pt back about sample call placed.   Pt was given samples back in early Nov with an application to fill out and get back to our office.   Don't see anywhere in the chart or media where he's returned his pt assistance application back into our office.   Tried calling him back to inquire more information about this and he did not answer and phone continues to ring with no VM offered.   Will also route this call to our refill dept to see if they can further assist the pt with samples of eliquis request/pt assistance forms.

## 2023-07-24 NOTE — Telephone Encounter (Signed)
*  STAT* If patient is at the pharmacy, call can be transferred to refill team.   1. Which medications need to be refilled? (please list name of each medication and dose if known) metoprolol tartrate (LOPRESSOR) 50 MG tablet    2. Would you like to learn more about the convenience, safety, & potential cost savings by using the [facility]? No   3. Are you open to using the [facility] No   4. Which pharmacy/location (including street and city if local pharmacy) is medication to be sent to? [id number] - [place], [place] - [address] AT [address]     5. Do they need a 30 day or 90 day supply? 100 Day Supply [employer] gives 100 day supply no extra charge)  Pt is currently out.

## 2023-07-24 NOTE — Telephone Encounter (Signed)
Refused the RX because the RX was filled on 05/30/23

## 2023-07-24 NOTE — Telephone Encounter (Signed)
Refill dept was able to make contact with the pt about samples.  Eric with refills left the pt samples of eliquis as well as a new pt assistance application for eliquis, at the front desk for him to pick up.  Pt was informed by refill dept that he must start the application process in order to receive further samples of this medication.  Pt assured the refill dept he will do the application and get his part in asap.

## 2023-07-24 NOTE — Telephone Encounter (Signed)
Patient calling the office for samples of medication:   1.  What medication and dosage are you requesting samples for?           apixaban (ELIQUIS) 5 MG TABS tablet Take 1 tablet (5 mg total) by mouth 2 (two) times daily.    2.  Are you currently out of this medication? Yes

## 2023-07-25 ENCOUNTER — Telehealth: Payer: Self-pay

## 2023-07-25 ENCOUNTER — Other Ambulatory Visit (HOSPITAL_COMMUNITY): Payer: Self-pay

## 2023-07-25 NOTE — Telephone Encounter (Signed)
Letter mailed to patient with application instructions.

## 2023-07-25 NOTE — Telephone Encounter (Signed)
 PAP: PAP application for ELIQUIS, [employer] has been mailed to pt's home address on file. Will fax provider portion of application to provider's office when pt's portion is received.

## 2023-07-26 ENCOUNTER — Telehealth: Payer: Self-pay | Admitting: Cardiology

## 2023-07-26 ENCOUNTER — Other Ambulatory Visit: Payer: Self-pay

## 2023-07-26 DIAGNOSIS — I4819 Other persistent atrial fibrillation: Secondary | ICD-10-CM

## 2023-07-26 MED ORDER — METOPROLOL TARTRATE 50 MG PO TABS: 50.0000 mg | ORAL_TABLET | Freq: Two times a day (BID) | ORAL | 3 refills | Status: DC

## 2023-07-26 NOTE — Telephone Encounter (Signed)
Pt is requesting a refill for metoprolol - metoprolol tartrate (LOPRESSOR) 50 MG tablet   He states the pharmacy told him he was denied. I called the pharmacy and they said the same thing. Please advise.

## 2023-07-26 NOTE — Progress Notes (Signed)
Spoke with patient and he states he is out of metoprolol and the pharmacy keep saying he is out of refills.  Spoke with pharmacy and they did not received RX sent on 05/30/23.   Rx resent to pharmacy.  Patient is aware

## 2023-08-21 ENCOUNTER — Other Ambulatory Visit: Payer: Self-pay | Admitting: Internal Medicine

## 2023-08-21 ENCOUNTER — Other Ambulatory Visit: Payer: Self-pay

## 2023-08-31 ENCOUNTER — Other Ambulatory Visit (HOSPITAL_COMMUNITY): Payer: Self-pay

## 2023-08-31 ENCOUNTER — Other Ambulatory Visit: Payer: Self-pay | Admitting: *Deleted

## 2023-08-31 MED ORDER — DILTIAZEM HCL ER COATED BEADS 180 MG PO CP24
180.0000 mg | ORAL_CAPSULE | Freq: Every evening | ORAL | 3 refills | Status: DC
Start: 2023-08-31 — End: 2023-09-04
  Filled 2023-08-31: qty 90, 90d supply, fill #0

## 2023-09-04 ENCOUNTER — Other Ambulatory Visit: Payer: Self-pay

## 2023-09-04 MED ORDER — DILTIAZEM HCL ER COATED BEADS 180 MG PO CP24: 180.0000 mg | ORAL_CAPSULE | Freq: Every evening | ORAL | 2 refills | Status: DC

## 2023-09-06 ENCOUNTER — Telehealth: Payer: Self-pay

## 2023-09-06 NOTE — Telephone Encounter (Signed)
Copied from CRM [id number]. Topic: Clinical - Prescription Issue >> Sep 06, 2023  3:07 PM Adaysia C wrote: Reason for CRM: Patient called to follow up on if there any samples for the empagliflozin (JARDIANCE) 25 MG TABS tablets he can pick up to have until his appointment Monday 09/10/2023. He can not afford the current prescribed medication and he is out of his medication and is worried it will effect his diabetes if he doesn't take his medication every day. Please follow up with patient [phone]

## 2023-09-06 NOTE — Telephone Encounter (Signed)
Copied from CRM [id number]. Topic: Clinical - Prescription Issue >> Sep 06, 2023 11:15 AM Isabell A wrote: Reason for CRM: Patient states empagliflozin (JARDIANCE) 25 MG TABS tablet is to expensive for him to afford, he is requesting samples - he took his last Jardiance tablet today.

## 2023-09-07 NOTE — Telephone Encounter (Signed)
Called and let Pt know samples are ready for pick up and will be placed up front.

## 2023-09-10 ENCOUNTER — Ambulatory Visit: Payer: Medicare Other | Admitting: Internal Medicine

## 2023-09-10 ENCOUNTER — Encounter: Payer: Self-pay | Admitting: Internal Medicine

## 2023-09-10 VITALS — BP 124/82 | HR 70 | Temp 98.3°F | Ht 71.0 in | Wt 226.0 lb

## 2023-09-10 DIAGNOSIS — E1121 Type 2 diabetes mellitus with diabetic nephropathy: Secondary | ICD-10-CM

## 2023-09-10 DIAGNOSIS — E559 Vitamin D deficiency, unspecified: Secondary | ICD-10-CM | POA: Diagnosis not present

## 2023-09-10 DIAGNOSIS — R21 Rash and other nonspecific skin eruption: Secondary | ICD-10-CM

## 2023-09-10 DIAGNOSIS — E1169 Type 2 diabetes mellitus with other specified complication: Secondary | ICD-10-CM

## 2023-09-10 DIAGNOSIS — Z125 Encounter for screening for malignant neoplasm of prostate: Secondary | ICD-10-CM

## 2023-09-10 DIAGNOSIS — E538 Deficiency of other specified B group vitamins: Secondary | ICD-10-CM

## 2023-09-10 DIAGNOSIS — E785 Hyperlipidemia, unspecified: Secondary | ICD-10-CM

## 2023-09-10 DIAGNOSIS — Z0001 Encounter for general adult medical examination with abnormal findings: Secondary | ICD-10-CM

## 2023-09-10 DIAGNOSIS — N1831 Chronic kidney disease, stage 3a: Secondary | ICD-10-CM | POA: Diagnosis not present

## 2023-09-10 DIAGNOSIS — I10 Essential (primary) hypertension: Secondary | ICD-10-CM

## 2023-09-10 MED ORDER — KETOCONAZOLE 2 % EX CREA: 1.0000 | TOPICAL_CREAM | Freq: Every day | CUTANEOUS | 2 refills | Status: AC

## 2023-09-10 NOTE — Patient Instructions (Addendum)
You will be contacted regarding the referral for: [facility] pharmacist regarding the [employer]  Ok to take the jardiance or farxiga samples today if we have them  Please take all new medication as prescribed - the cream  Please continue all other medications as before, and refills have been done if requested.  Please have the pharmacy call with any other refills you may need.  Please keep your appointments with your specialists as you may have planned  Please make an Appointment to return about Mar 2025, or sooner if needed, also with Lab Appointment for testing done 3-5 days before at the FIRST FLOOR Lab (so this is for TWO appointments - please see the scheduling desk as you leave)

## 2023-09-10 NOTE — Progress Notes (Unsigned)
Patient ID: Roberto Rivera, male   DOB: 1951-08-10, 73 y.o.   MRN: [id number]        Chief Complaint: follow up HTN, HLD and DM, rash       HPI:  Roberto Rivera is a 73 y.o. male here with c/o difficulty affording the jardiance, has only 5 pills left.  Pt denies chest pain, increased sob or doe, wheezing, orthopnea, PND, increased LE swelling, palpitations, dizziness or syncope.   Pt denies polydipsia, polyuria, or new focal neuro s/s.    Pt denies fever, wt loss, night sweats, loss of appetite, or other constitutional symptoms Also has a groin rash with mild itching for the past wk.         Wt Readings from Last 3 Encounters:  09/10/23 226 lb (102.5 kg)  05/30/23 225 lb (102.1 kg)  05/28/23 226 lb (102.5 kg)   BP Readings from Last 3 Encounters:  09/10/23 124/82  05/30/23 112/80  05/28/23 121/77         Past Medical History:  Diagnosis Date   ALLERGIC RHINITIS 04/01/2007   Diabetes mellitus    DIABETES MELLITUS, TYPE II 04/01/2007   ERECTILE DYSFUNCTION, ORGANIC 02/07/2008   Hypercholesteremia    HYPERCHOLESTEROLEMIA 02/07/2008   Hypertension    HYPERTENSION 04/01/2007   MICROCYTOSIS 02/07/2008   OSTEOARTHRITIS, SPINE 02/07/2008   PAF (paroxysmal atrial fibrillation) (HCC) 07/11/2020   Stroke (HCC)    THYROID CYST 02/07/2008   Past Surgical History:  Procedure Laterality Date   cyst removal  01/2021   back   LEFT HEART CATH AND CORONARY ANGIOGRAPHY N/A 09/28/2020   Procedure: LEFT HEART CATH AND CORONARY ANGIOGRAPHY;  Surgeon: Elder Negus, MD;  Location: [facility];  Service: Cardiovascular;  Laterality: N/A;    reports that he quit smoking about 3 years ago. His smoking use included cigarettes. He has never used smokeless tobacco. He reports that he does not drink alcohol and does not use drugs. family history includes Cancer in his mother; Stroke in his sister. No Known Allergies Current Outpatient Medications on File Prior to Visit  Medication Sig Dispense Refill    Accu-Chek FastClix Lancets MISC USE TO CHECK BLOOD SUGAR AS DIRECTED ONCE DAILY 102 each 3   ACCU-CHEK GUIDE TEST test strip CHECK ONCE DAILY 100 strip 3   albuterol (VENTOLIN HFA) 108 (90 Base) MCG/ACT inhaler Inhale 2 puffs into the lungs every 6 (six) hours as needed for wheezing or shortness of breath. (Patient taking differently: Inhale 2 puffs into the lungs 2 (two) times daily as needed for wheezing or shortness of breath.) 24 g 3   ammonium lactate (AMLACTIN DAILY) 12 % lotion Apply 1 Application topically as needed for dry skin. 400 g 0   ammonium lactate (AMLACTIN) 12 % cream Apply 1 Application topically as needed for dry skin.     apixaban (ELIQUIS) 5 MG TABS tablet Take 1 tablet (5 mg total) by mouth 2 (two) times daily. 180 tablet 3   apixaban (ELIQUIS) 5 MG TABS tablet Take 1 tablet (5 mg total) by mouth 2 (two) times daily. 56 tablet    brimonidine (ALPHAGAN) 0.2 % ophthalmic solution 1 drop 2 (two) times daily.     Cholecalciferol 50 MCG (2000 UT) TABS 1 tab by mouth once daily (Patient taking differently: Take 2,000 Units by mouth daily.) 30 tablet 99   diltiazem (CARDIZEM CD) 180 MG 24 hr capsule Take 1 capsule (180 mg total) by mouth every evening. 90 capsule 2  dorzolamide (TRUSOPT) 2 % ophthalmic solution 1 drop 2 (two) times daily.     empagliflozin (JARDIANCE) 25 MG TABS tablet Take 1 tablet (25 mg total) by mouth daily before breakfast. 90 tablet 3   hydrALAZINE (APRESOLINE) 100 MG tablet Take 1 tablet (100 mg total) by mouth 3 (three) times daily. 270 tablet 3   hydrochlorothiazide (HYDRODIURIL) 25 MG tablet Take 1 tablet (25 mg total) by mouth daily. 90 tablet 3   isosorbide dinitrate (ISORDIL) 20 MG tablet Take 1 tablet (20 mg total) by mouth 3 (three) times daily. 270 tablet 3   ketorolac (ACULAR) 0.5 % ophthalmic solution Per patient for Cataract surgery in November 2024     latanoprost (XALATAN) 0.005 % ophthalmic solution 1 drop at bedtime.     metoprolol tartrate  (LOPRESSOR) 50 MG tablet Take 1 tablet (50 mg total) by mouth 2 (two) times daily. 180 tablet 3   ofloxacin (OCUFLOX) 0.3 % ophthalmic solution Per patient for Cataract surgery in November 2024     potassium chloride (KLOR-CON 10) 10 MEQ tablet Take 1 tablet (10 mEq total) by mouth daily. 90 tablet 3   prednisoLONE acetate (PRED FORTE) 1 % ophthalmic suspension Per patient for cataract surgery in November 2024     rosuvastatin (CRESTOR) 5 MG tablet Take 1 tablet (5 mg total) by mouth at bedtime. 1 tab by mouth once daily 90 tablet 3   nitroGLYCERIN (NITROSTAT) 0.4 MG SL tablet Place 1 tablet (0.4 mg total) under the tongue every 5 (five) minutes as needed for chest pain. 90 tablet 3   No current facility-administered medications on file prior to visit.        ROS:  All others reviewed and negative.  Objective        PE:  BP 124/82 (BP Location: Right Arm, Patient Position: Sitting, Cuff Size: Normal)   Pulse 70   Temp 98.3 F (36.8 C) (Oral)   Ht 5\' 11"  (1.803 m)   Wt 226 lb (102.5 kg)   SpO2 96%   BMI 31.52 kg/m                 Constitutional: Pt appears in NAD               HENT: Head: NCAT.                Right Ear: External ear normal.                 Left Ear: External ear normal.                Eyes: . Pupils are equal, round, and reactive to light. Conjunctivae and EOM are normal               Nose: without d/c or deformity               Neck: Neck supple. Gross normal ROM               Cardiovascular: Normal rate and regular rhythm.                 Pulmonary/Chest: Effort normal and breath sounds without rales or wheezing.                Abd:  Soft, NT, ND, + BS, no organomegaly               Neurological: Pt is alert. At baseline orientation, motor grossly intact  Skin: Skin is warm.  LE edema - none               Psychiatric: Pt behavior is normal without agitation   Micro: none  Cardiac tracings I have personally interpreted today:  none  Pertinent  Radiological findings (summarize): none   Lab Results  Component Value Date   WBC 7.7 10/06/2022   HGB 12.9 (L) 10/06/2022   HCT 40.3 10/06/2022   PLT 247.0 10/06/2022   GLUCOSE 101 (H) 05/02/2023   CHOL 98 05/02/2023   TRIG 58.0 05/02/2023   HDL 44.00 05/02/2023   LDLCALC 43 05/02/2023   ALT 12 05/02/2023   AST 19 05/02/2023   NA 138 05/02/2023   K 3.2 (L) 05/02/2023   CL 99 05/02/2023   CREATININE 1.63 (H) 05/02/2023   BUN 22 05/02/2023   CO2 31 05/02/2023   TSH 1.20 10/06/2022   PSA 2.65 10/06/2022   INR 1.0 06/30/2020   HGBA1C 6.4 05/02/2023   MICROALBUR 10.6 (H) 10/06/2022   Assessment/Plan:  Roberto Rivera is a 73 y.o. Black or African American [2] male with  has a past medical history of ALLERGIC RHINITIS (04/01/2007), Diabetes mellitus, DIABETES MELLITUS, TYPE II (04/01/2007), ERECTILE DYSFUNCTION, ORGANIC (02/07/2008), Hypercholesteremia, HYPERCHOLESTEROLEMIA (02/07/2008), Hypertension, HYPERTENSION (04/01/2007), MICROCYTOSIS (02/07/2008), OSTEOARTHRITIS, SPINE (02/07/2008), PAF (paroxysmal atrial fibrillation) (HCC) (07/11/2020), Stroke (HCC), and THYROID CYST (02/07/2008).  CKD (chronic kidney disease) stage 3, GFR 30-59 ml/min (HCC) Lab Results  Component Value Date   CREATININE 1.63 (H) 05/02/2023   Stable overall, cont to avoid nephrotoxins   Diabetes (HCC) Lab Results  Component Value Date   HGBA1C 6.4 05/02/2023   Stable, pt to continue current medical treatment jardiance with samples today, and refer pharmacy to assist with pt assist program   Essential hypertension BP Readings from Last 3 Encounters:  09/10/23 124/82  05/30/23 112/80  05/28/23 121/77   Stable, pt to continue medical treatment card Ct 180 every day, hydralazine 00 tid, hct 25 every day, lopressor 50 bid   Hyperlipidemia associated with type 2 diabetes mellitus (HCC) Lab Results  Component Value Date   LDLCALC 43 05/02/2023   Stable, pt to continue current statin crestor 5  qd   Vitamin D deficiency Last vitamin D Lab Results  Component Value Date   VD25OH 61.36 10/06/2022   Stable, cont oral replacement   Rash and nonspecific skin eruption Worsening groin - for ketoconozole cream prn  Followup: Return in about 8 weeks (around 11/05/2023).  Oliver Barre, MD 09/13/2023 7:51 PM [facility] [facility]

## 2023-09-13 ENCOUNTER — Encounter: Payer: Self-pay | Admitting: Internal Medicine

## 2023-09-13 NOTE — Assessment & Plan Note (Signed)
BP Readings from Last 3 Encounters:  09/10/23 124/82  05/30/23 112/80  05/28/23 121/77   Stable, pt to continue medical treatment card Ct 180 every day, hydralazine 00 tid, hct 25 every day, lopressor 50 bid

## 2023-09-13 NOTE — Assessment & Plan Note (Signed)
Last vitamin D Lab Results  Component Value Date   VD25OH 61.36 10/06/2022   Stable, cont oral replacement

## 2023-09-13 NOTE — Assessment & Plan Note (Signed)
Worsening groin - for ketoconozole cream prn

## 2023-09-13 NOTE — Assessment & Plan Note (Signed)
Lab Results  Component Value Date   LDLCALC 43 05/02/2023   Stable, pt to continue current statin crestor 5 qd

## 2023-09-13 NOTE — Assessment & Plan Note (Signed)
Lab Results  Component Value Date   CREATININE 1.63 (H) 05/02/2023   Stable overall, cont to avoid nephrotoxins

## 2023-09-13 NOTE — Assessment & Plan Note (Signed)
Lab Results  Component Value Date   HGBA1C 6.4 05/02/2023   Stable, pt to continue current medical treatment jardiance with samples today, and refer pharmacy to assist with pt assist program

## 2023-09-17 ENCOUNTER — Telehealth: Payer: Self-pay

## 2023-09-17 NOTE — Progress Notes (Signed)
Care Guide Pharmacy Note  09/17/2023 Name: Miguel Christiana MRN: [id number] DOB: [birth year]  Referred By: Corwin Levins, MD Reason for referral: Care Coordination (Outreach to schedule with Pharm d )   Ilias Stcharles is a 73 y.o. year old male who is a primary care patient of Corwin Levins, MD.  [name] was referred to the pharmacist for assistance related to: DMII  Successful contact was made with the patient to discuss pharmacy services including being ready for the pharmacist to call at least 5 minutes before the scheduled appointment time and to have medication bottles and any blood pressure readings ready for review. The patient agreed to meet with the pharmacist via telephone visit on (date/time).09/27/2023  Penne Lash , RMA     [place]  [facility] Guide  Direct Dial: [phone]  Website: [email or web address]

## 2023-09-26 ENCOUNTER — Encounter (INDEPENDENT_AMBULATORY_CARE_PROVIDER_SITE_OTHER): Payer: Medicare Other | Admitting: Ophthalmology

## 2023-09-26 DIAGNOSIS — Z7984 Long term (current) use of oral hypoglycemic drugs: Secondary | ICD-10-CM | POA: Diagnosis not present

## 2023-09-26 DIAGNOSIS — H43813 Vitreous degeneration, bilateral: Secondary | ICD-10-CM

## 2023-09-26 DIAGNOSIS — H353132 Nonexudative age-related macular degeneration, bilateral, intermediate dry stage: Secondary | ICD-10-CM

## 2023-09-26 DIAGNOSIS — H2513 Age-related nuclear cataract, bilateral: Secondary | ICD-10-CM

## 2023-09-26 DIAGNOSIS — I10 Essential (primary) hypertension: Secondary | ICD-10-CM | POA: Diagnosis not present

## 2023-09-26 DIAGNOSIS — H35033 Hypertensive retinopathy, bilateral: Secondary | ICD-10-CM | POA: Diagnosis not present

## 2023-09-26 DIAGNOSIS — E113312 Type 2 diabetes mellitus with moderate nonproliferative diabetic retinopathy with macular edema, left eye: Secondary | ICD-10-CM | POA: Diagnosis not present

## 2023-09-27 ENCOUNTER — Other Ambulatory Visit: Payer: Medicare Other | Admitting: Pharmacist

## 2023-09-27 DIAGNOSIS — E1121 Type 2 diabetes mellitus with diabetic nephropathy: Secondary | ICD-10-CM

## 2023-09-27 NOTE — Patient Instructions (Signed)
 It was a pleasure speaking with you today!    Feel free to call with any questions or concerns!  Arbutus Leas, PharmD, BCPS, CPP Clinical Pharmacist Practitioner [place] [facility] at [facility] [phone]

## 2023-09-27 NOTE — Progress Notes (Signed)
   09/27/2023 Name: Roberto Rivera MRN: [id number] DOB: 04-29-1951  Chief Complaint  Patient presents with   Med Access    Roberto Rivera is a 73 y.o. year old male who presented for a telephone visit.   They were referred to the pharmacist by their PCP for assistance in managing medication access.   Subjective:  Care Team: Primary Care Provider: Norleen Lynwood ORN, MD ; Next Scheduled Visit: 10/31/23  Medication Access/Adherence  Current Pharmacy:  [id number] - [place], [place] - [address] AT [facility] [address] [place] [address] Phone: [phone] Fax: [phone]   Patient reports affordability concerns with their medications: Yes  Patient reports access/transportation concerns to their pharmacy: No  Patient reports adherence concerns with their medications:  No    Pt is unable to afford Jardiance . Eliquis  cost if also high. His monthly income is under 250% FPL He notes he is almost out of Jardiance  - has been getting samples  Objective:  Lab Results  Component Value Date   HGBA1C 6.4 05/02/2023    Lab Results  Component Value Date   CREATININE 1.63 (H) 05/02/2023   BUN 22 05/02/2023   NA 138 05/02/2023   K 3.2 (L) 05/02/2023   CL 99 05/02/2023   CO2 31 05/02/2023    Lab Results  Component Value Date   CHOL 98 05/02/2023   HDL 44.00 05/02/2023   LDLCALC 43 05/02/2023   TRIG 58.0 05/02/2023   CHOLHDL 2 05/02/2023    Medications Reviewed Today   Medications were not reviewed in this encounter       Assessment/Plan:   Medication access: - Applied for ExtraHelp through [facility] which would help with the cost of all meds. Decision notice will be mailed to the patient. - Will leave more samples, advised to cut in half for now while waiting for application response. His A1c is very well controlled.  Follow Up Plan: 2 weeks  Darrelyn Drum, PharmD, BCPS, CPP Clinical Pharmacist Practitioner [place] [facility] at [facility] [phone]

## 2023-10-05 ENCOUNTER — Other Ambulatory Visit: Payer: Self-pay

## 2023-10-05 ENCOUNTER — Other Ambulatory Visit: Payer: Self-pay | Admitting: Internal Medicine

## 2023-10-05 MED ORDER — HYDROCHLOROTHIAZIDE 25 MG PO TABS: 25.0000 mg | ORAL_TABLET | Freq: Every day | ORAL | 3 refills | Status: AC

## 2023-10-05 NOTE — Telephone Encounter (Unsigned)
Copied from CRM [id number]. Topic: Clinical - Medication Refill >> Oct 05, 2023 12:35 PM Truddie Crumble wrote: Most Recent Primary Care Visit:  Provider: Candy Sledge R  Department: [facility]  Visit Type: PATIENT OUTREACH 60  Date: 09/27/2023  Medication: Accu-Chek FastClix Lancets MISC, hydrochlorothiazide  Has the patient contacted their pharmacy? Yes (Agent: If no, request that the patient contact the pharmacy for the refill. If patient does not wish to contact the pharmacy document the reason why and proceed with request.) (Agent: If yes, when and what did the pharmacy advise?)  Is this the correct pharmacy for this prescription? Yes If no, delete pharmacy and type the correct one.  This is the patient's preferred pharmacy:  [facility] [address] [place] - [address] AT [facility] [address] [place] [address] Phone: [phone] Fax: [phone]   Has the prescription been filled recently? No  Is the patient out of the medication? Yes  Has the patient been seen for an appointment in the last year OR does the patient have an upcoming appointment? Yes  Can we respond through MyChart? No  Agent: Please be advised that Rx refills may take up to 3 business days. We ask that you follow-up with your pharmacy.

## 2023-10-11 ENCOUNTER — Other Ambulatory Visit: Payer: Medicare Other | Admitting: Pharmacist

## 2023-10-11 DIAGNOSIS — E1121 Type 2 diabetes mellitus with diabetic nephropathy: Secondary | ICD-10-CM

## 2023-10-11 NOTE — Progress Notes (Signed)
   10/11/2023 Name: Roberto Rivera MRN: [id number] DOB: 27-Jan-1951  Chief Complaint  Patient presents with   Medication Access    Roberto Rivera is a 73 y.o. year old male who presented for a telephone visit.   They were referred to the pharmacist by their PCP for assistance in managing medication access.   Subjective:  Care Team: Primary Care Provider: Corwin Levins, MD ; Next Scheduled Visit: 10/31/23  Medication Access/Adherence  Current Pharmacy:  [id number] - [place], [place] - [address] AT [facility] [address] [place] [address] Phone: [phone] Fax: [phone]   Patient reports affordability concerns with their medications: Yes  Patient reports access/transportation concerns to their pharmacy: No  Patient reports adherence concerns with their medications:  No    Pt is unable to afford Jardiance. Eliquis cost if also high. His monthly income is under 250% FPL   Pt notes he has not received a letter from Social Security regarding ExtraHelp decision, however he does report that a 30 DS of his Eliquis that was filled 2/17 was only $12.  Objective:  Lab Results  Component Value Date   HGBA1C 6.4 05/02/2023    Lab Results  Component Value Date   CREATININE 1.63 (H) 05/02/2023   BUN 22 05/02/2023   NA 138 05/02/2023   K 3.2 (L) 05/02/2023   CL 99 05/02/2023   CO2 31 05/02/2023    Lab Results  Component Value Date   CHOL 98 05/02/2023   HDL 44.00 05/02/2023   LDLCALC 43 05/02/2023   TRIG 58.0 05/02/2023   CHOLHDL 2 05/02/2023    Medications Reviewed Today   Medications were not reviewed in this encounter       Assessment/Plan:   Medication access: - Ran test claim for Jardiance 30 DS and it was also a $12 copay - Appears ExtraHelp was approved and pt will have the reduced price for Eliquis and Jardiance moving forward  Follow Up Plan: PRN  Arbutus Leas, PharmD, BCPS, CPP Clinical Pharmacist Practitioner [place]  [facility] Care at [facility] [phone]

## 2023-10-19 ENCOUNTER — Other Ambulatory Visit: Payer: Self-pay | Admitting: Cardiology

## 2023-10-19 DIAGNOSIS — I10 Essential (primary) hypertension: Secondary | ICD-10-CM

## 2023-10-30 ENCOUNTER — Ambulatory Visit: Payer: Medicare Other | Admitting: Internal Medicine

## 2023-10-31 ENCOUNTER — Encounter: Payer: Self-pay | Admitting: Internal Medicine

## 2023-10-31 ENCOUNTER — Ambulatory Visit (INDEPENDENT_AMBULATORY_CARE_PROVIDER_SITE_OTHER): Payer: Medicare Other | Admitting: Internal Medicine

## 2023-10-31 VITALS — BP 120/68 | HR 62 | Temp 97.8°F | Ht 71.0 in | Wt 219.0 lb

## 2023-10-31 DIAGNOSIS — N1831 Chronic kidney disease, stage 3a: Secondary | ICD-10-CM | POA: Diagnosis not present

## 2023-10-31 DIAGNOSIS — I10 Essential (primary) hypertension: Secondary | ICD-10-CM | POA: Diagnosis not present

## 2023-10-31 DIAGNOSIS — E538 Deficiency of other specified B group vitamins: Secondary | ICD-10-CM | POA: Diagnosis not present

## 2023-10-31 DIAGNOSIS — E1169 Type 2 diabetes mellitus with other specified complication: Secondary | ICD-10-CM | POA: Diagnosis not present

## 2023-10-31 DIAGNOSIS — E785 Hyperlipidemia, unspecified: Secondary | ICD-10-CM

## 2023-10-31 DIAGNOSIS — E1121 Type 2 diabetes mellitus with diabetic nephropathy: Secondary | ICD-10-CM

## 2023-10-31 DIAGNOSIS — Z0000 Encounter for general adult medical examination without abnormal findings: Secondary | ICD-10-CM | POA: Diagnosis not present

## 2023-10-31 DIAGNOSIS — Z23 Encounter for immunization: Secondary | ICD-10-CM | POA: Diagnosis not present

## 2023-10-31 DIAGNOSIS — Z7984 Long term (current) use of oral hypoglycemic drugs: Secondary | ICD-10-CM

## 2023-10-31 DIAGNOSIS — E559 Vitamin D deficiency, unspecified: Secondary | ICD-10-CM

## 2023-10-31 DIAGNOSIS — Z125 Encounter for screening for malignant neoplasm of prostate: Secondary | ICD-10-CM | POA: Diagnosis not present

## 2023-10-31 LAB — URINALYSIS, ROUTINE W REFLEX MICROSCOPIC
Bilirubin Urine: NEGATIVE
Hgb urine dipstick: NEGATIVE
Ketones, ur: NEGATIVE
Leukocytes,Ua: NEGATIVE
Nitrite: NEGATIVE
RBC / HPF: NONE SEEN (ref 0–?)
Specific Gravity, Urine: 1.01 (ref 1.000–1.030)
Total Protein, Urine: NEGATIVE
Urine Glucose: >=1000 — AB
Urobilinogen, UA: 0.2 (ref 0.0–1.0)
pH: 6.5 (ref 5.0–8.0)

## 2023-10-31 LAB — CBC WITH DIFFERENTIAL/PLATELET
Basophils Absolute: 0 10*3/uL (ref 0.0–0.1)
Basophils Relative: 0.4 % (ref 0.0–3.0)
Eosinophils Absolute: 0.1 10*3/uL (ref 0.0–0.7)
Eosinophils Relative: 1.8 % (ref 0.0–5.0)
HCT: 41.8 % (ref 39.0–52.0)
Hemoglobin: 13.4 g/dL (ref 13.0–17.0)
Lymphocytes Relative: 27.6 % (ref 12.0–46.0)
Lymphs Abs: 1.9 10*3/uL (ref 0.7–4.0)
MCHC: 32 g/dL (ref 30.0–36.0)
MCV: 72.8 fl — ABNORMAL LOW (ref 78.0–100.0)
Monocytes Absolute: 0.9 10*3/uL (ref 0.1–1.0)
Monocytes Relative: 12.5 % — ABNORMAL HIGH (ref 3.0–12.0)
Neutro Abs: 3.9 10*3/uL (ref 1.4–7.7)
Neutrophils Relative %: 57.7 % (ref 43.0–77.0)
Platelets: 225 10*3/uL (ref 150.0–400.0)
RBC: 5.75 Mil/uL (ref 4.22–5.81)
RDW: 21 % — ABNORMAL HIGH (ref 11.5–15.5)
WBC: 6.8 10*3/uL (ref 4.0–10.5)

## 2023-10-31 LAB — MICROALBUMIN / CREATININE URINE RATIO
Creatinine,U: 61.7 mg/dL
Microalb Creat Ratio: 16.3 mg/g (ref 0.0–30.0)
Microalb, Ur: 1.8 mg/dL (ref 0.0–1.9)

## 2023-10-31 LAB — BASIC METABOLIC PANEL
BUN: 25 mg/dL — ABNORMAL HIGH (ref 6–23)
CO2: 28 meq/L (ref 19–32)
Calcium: 9.6 mg/dL (ref 8.4–10.5)
Chloride: 100 meq/L (ref 96–112)
Creatinine, Ser: 1.68 mg/dL — ABNORMAL HIGH (ref 0.40–1.50)
GFR: 40.48 mL/min — ABNORMAL LOW (ref >60.00)
Glucose, Bld: 126 mg/dL — ABNORMAL HIGH (ref 70–99)
Potassium: 3.4 meq/L — ABNORMAL LOW (ref 3.5–5.1)
Sodium: 139 meq/L (ref 135–145)

## 2023-10-31 LAB — LIPID PANEL
Cholesterol: 108 mg/dL (ref 0–200)
HDL: 44 mg/dL (ref >39.00)
LDL Cholesterol: 51 mg/dL (ref 0–99)
NonHDL: 63.58
Total CHOL/HDL Ratio: 2
Triglycerides: 64 mg/dL (ref 0.0–149.0)
VLDL: 12.8 mg/dL (ref 0.0–40.0)

## 2023-10-31 LAB — PSA: PSA: 3.05 ng/mL (ref 0.10–4.00)

## 2023-10-31 LAB — HEPATIC FUNCTION PANEL
ALT: 11 U/L (ref 0–53)
AST: 15 U/L (ref 0–37)
Albumin: 4 g/dL (ref 3.5–5.2)
Alkaline Phosphatase: 89 U/L (ref 39–117)
Bilirubin, Direct: 0.2 mg/dL (ref 0.0–0.3)
Total Bilirubin: 0.6 mg/dL (ref 0.2–1.2)
Total Protein: 7 g/dL (ref 6.0–8.3)

## 2023-10-31 LAB — HEMOGLOBIN A1C: Hgb A1c MFr Bld: 6.3 % (ref 4.6–6.5)

## 2023-10-31 LAB — VITAMIN D 25 HYDROXY (VIT D DEFICIENCY, FRACTURES): VITD: 60.45 ng/mL (ref 30.00–100.00)

## 2023-10-31 LAB — TSH: TSH: 1.73 u[IU]/mL (ref 0.35–5.50)

## 2023-10-31 LAB — VITAMIN B12: Vitamin B-12: 334 pg/mL (ref 211–911)

## 2023-10-31 MED ORDER — POTASSIUM CHLORIDE ER 10 MEQ PO TBCR: 10.0000 meq | EXTENDED_RELEASE_TABLET | Freq: Every day | ORAL | 3 refills | Status: AC

## 2023-10-31 NOTE — Patient Instructions (Addendum)
 You had the Prevnar 20 pneumonia shot today  Please continue all other medications as before, and refills have been done if requested.  Please have the pharmacy call with any other refills you may need.  Please continue your efforts at being more active, low cholesterol diet, and weight control.  You are otherwise up to date with prevention measures today.  Please keep your appointments with your specialists as you may have planned  Please go to the LAB at the blood drawing area for the tests to be done  You will be contacted by phone if any changes need to be made immediately.  Otherwise, you will receive a letter about your results with an explanation, but please check with MyChart first.  Please make an Appointment to return in 6 months, or sooner if needed

## 2023-10-31 NOTE — Progress Notes (Signed)
 Patient ID: Roberto Rivera, male   DOB: Oct 06, 1950, 73 y.o.   MRN: [id number]         Chief Complaint:: wellness exam and dm, htn, hld, low vit d       HPI:  Roberto Rivera is a 73 y.o. male here for wellness exam; due for prevnar 20 today, for tdap and shingrix at pharmacy, o/w up to date                        Also Pt denies chest pain, increased sob or doe, wheezing, orthopnea, PND, increased LE swelling, palpitations, dizziness or syncope.   Pt denies polydipsia, polyuria, or new focal neuro s/s.    Pt denies fever, wt loss, night sweats, loss of appetite, or other constitutional symptoms     Wt Readings from Last 3 Encounters:  10/31/23 219 lb (99.3 kg)  09/10/23 226 lb (102.5 kg)  05/30/23 225 lb (102.1 kg)   BP Readings from Last 3 Encounters:  10/31/23 120/68  09/10/23 124/82  05/30/23 112/80   Immunization History  Administered Date(s) Administered   PFIZER(Purple Top)SARS-COV-2 Vaccination 04/23/2020, 05/14/2020   PNEUMOCOCCAL CONJUGATE-20 10/31/2023   Pneumococcal Conjugate-13 12/25/2017   Pneumococcal Polysaccharide-23 02/04/2010, 01/28/2020   Td 01/29/2009   Zoster, Live 12/14/2011   Health Maintenance Due  Topic Date Due   Zoster Vaccines- Shingrix (1 of 2) 10/14/1970   DTaP/Tdap/Td (2 - Tdap) 01/30/2019      Past Medical History:  Diagnosis Date   ALLERGIC RHINITIS 04/01/2007   Diabetes mellitus    DIABETES MELLITUS, TYPE II 04/01/2007   ERECTILE DYSFUNCTION, ORGANIC 02/07/2008   Hypercholesteremia    HYPERCHOLESTEROLEMIA 02/07/2008   Hypertension    HYPERTENSION 04/01/2007   MICROCYTOSIS 02/07/2008   OSTEOARTHRITIS, SPINE 02/07/2008   PAF (paroxysmal atrial fibrillation) (HCC) 07/11/2020   Stroke (HCC)    THYROID CYST 02/07/2008   Past Surgical History:  Procedure Laterality Date   cyst removal  01/2021   back   LEFT HEART CATH AND CORONARY ANGIOGRAPHY N/A 09/28/2020   Procedure: LEFT HEART CATH AND CORONARY ANGIOGRAPHY;  Surgeon: Elder Negus, MD;   Location: [facility];  Service: Cardiovascular;  Laterality: N/A;    reports that he quit smoking about 3 years ago. His smoking use included cigarettes. He has never used smokeless tobacco. He reports that he does not drink alcohol and does not use drugs. family history includes Cancer in his mother; Stroke in his sister. No Known Allergies Current Outpatient Medications on File Prior to Visit  Medication Sig Dispense Refill   Accu-Chek FastClix Lancets MISC USE TO CHECK ONCE DAILY 102 each 3   ACCU-CHEK GUIDE TEST test strip CHECK ONCE DAILY 100 strip 3   albuterol (VENTOLIN HFA) 108 (90 Base) MCG/ACT inhaler Inhale 2 puffs into the lungs every 6 (six) hours as needed for wheezing or shortness of breath. (Patient taking differently: Inhale 2 puffs into the lungs 2 (two) times daily as needed for wheezing or shortness of breath.) 24 g 3   ammonium lactate (AMLACTIN DAILY) 12 % lotion Apply 1 Application topically as needed for dry skin. 400 g 0   ammonium lactate (AMLACTIN) 12 % cream Apply 1 Application topically as needed for dry skin.     apixaban (ELIQUIS) 5 MG TABS tablet Take 1 tablet (5 mg total) by mouth 2 (two) times daily. 180 tablet 3   apixaban (ELIQUIS) 5 MG TABS tablet Take 1 tablet (5 mg total)  by mouth 2 (two) times daily. 56 tablet    brimonidine (ALPHAGAN) 0.2 % ophthalmic solution 1 drop 2 (two) times daily.     Cholecalciferol 50 MCG (2000 UT) TABS 1 tab by mouth once daily (Patient taking differently: Take 2,000 Units by mouth daily.) 30 tablet 99   diltiazem (CARDIZEM CD) 180 MG 24 hr capsule Take 1 capsule (180 mg total) by mouth every evening. 90 capsule 2   dorzolamide (TRUSOPT) 2 % ophthalmic solution 1 drop 2 (two) times daily.     empagliflozin (JARDIANCE) 25 MG TABS tablet Take 1 tablet (25 mg total) by mouth daily before breakfast. 90 tablet 3   hydrALAZINE (APRESOLINE) 100 MG tablet TAKE 1 TABLET(100 MG) BY MOUTH THREE TIMES DAILY 270 tablet 2    hydrochlorothiazide (HYDRODIURIL) 25 MG tablet Take 1 tablet (25 mg total) by mouth daily. 90 tablet 3   isosorbide dinitrate (ISORDIL) 20 MG tablet TAKE 1 TABLET(20 MG) BY MOUTH THREE TIMES DAILY 270 tablet 2   ketoconazole (NIZORAL) 2 % cream Apply 1 Application topically daily. 30 g 2   ketorolac (ACULAR) 0.5 % ophthalmic solution Per patient for Cataract surgery in November 2024     latanoprost (XALATAN) 0.005 % ophthalmic solution 1 drop at bedtime.     metoprolol tartrate (LOPRESSOR) 50 MG tablet Take 1 tablet (50 mg total) by mouth 2 (two) times daily. 180 tablet 3   ofloxacin (OCUFLOX) 0.3 % ophthalmic solution Per patient for Cataract surgery in November 2024     prednisoLONE acetate (PRED FORTE) 1 % ophthalmic suspension Per patient for cataract surgery in November 2024     rosuvastatin (CRESTOR) 5 MG tablet Take 1 tablet (5 mg total) by mouth at bedtime. 1 tab by mouth once daily 90 tablet 3   nitroGLYCERIN (NITROSTAT) 0.4 MG SL tablet Place 1 tablet (0.4 mg total) under the tongue every 5 (five) minutes as needed for chest pain. 90 tablet 3   No current facility-administered medications on file prior to visit.        ROS:  All others reviewed and negative.  Objective        PE:  BP 120/68 (BP Location: Right Arm, Patient Position: Sitting, Cuff Size: Normal)   Pulse 62   Temp 97.8 F (36.6 C) (Oral)   Ht 5\' 11"  (1.803 m)   Wt 219 lb (99.3 kg)   SpO2 98%   BMI 30.54 kg/m                 Constitutional: Pt appears in NAD               HENT: Head: NCAT.                Right Ear: External ear normal.                 Left Ear: External ear normal.                Eyes: . Pupils are equal, round, and reactive to light. Conjunctivae and EOM are normal               Nose: without d/c or deformity               Neck: Neck supple. Gross normal ROM               Cardiovascular: Normal rate and regular rhythm.  Pulmonary/Chest: Effort normal and breath sounds without  rales or wheezing.                Abd:  Soft, NT, ND, + BS, no organomegaly               Neurological: Pt is alert. At baseline orientation, motor grossly intact               Skin: Skin is warm. No rashes, no other new lesions, LE edema - none               Psychiatric: Pt behavior is normal without agitation   Micro: none  Cardiac tracings I have personally interpreted today:  none  Pertinent Radiological findings (summarize): none   Lab Results  Component Value Date   WBC 6.8 10/31/2023   HGB 13.4 10/31/2023   HCT 41.8 10/31/2023   PLT 225.0 10/31/2023   GLUCOSE 126 (H) 10/31/2023   CHOL 108 10/31/2023   TRIG 64.0 10/31/2023   HDL 44.00 10/31/2023   LDLCALC 51 10/31/2023   ALT 11 10/31/2023   AST 15 10/31/2023   NA 139 10/31/2023   K 3.4 (L) 10/31/2023   CL 100 10/31/2023   CREATININE 1.68 (H) 10/31/2023   BUN 25 (H) 10/31/2023   CO2 28 10/31/2023   TSH 1.73 10/31/2023   PSA 3.05 10/31/2023   INR 1.0 06/30/2020   HGBA1C 6.3 10/31/2023   MICROALBUR 1.8 10/31/2023   Assessment/Plan:  Roberto Rivera is a 73 y.o. Black or African American [2] male with  has a past medical history of ALLERGIC RHINITIS (04/01/2007), Diabetes mellitus, DIABETES MELLITUS, TYPE II (04/01/2007), ERECTILE DYSFUNCTION, ORGANIC (02/07/2008), Hypercholesteremia, HYPERCHOLESTEROLEMIA (02/07/2008), Hypertension, HYPERTENSION (04/01/2007), MICROCYTOSIS (02/07/2008), OSTEOARTHRITIS, SPINE (02/07/2008), PAF (paroxysmal atrial fibrillation) (HCC) (07/11/2020), Stroke (HCC), and THYROID CYST (02/07/2008).  Preventative health care Age and sex appropriate education and counseling updated with regular exercise and diet Referrals for preventative services - none needed Immunizations addressed - for prevnar 20 today, for tdap and shingrix at pharmacy Smoking counseling  - none needed Evidence for depression or other mood disorder - none significant Most recent labs reviewed. I have personally reviewed and have  noted: 1) the patient's medical and social history 2) The patient's current medications and supplements 3) The patient's height, weight, and BMI have been recorded in the chart   Diabetes [facility]) Lab Results  Component Value Date   HGBA1C 6.3 10/31/2023   Stable, pt to continue current medical treatment jardiance 25 every day,    CKD (chronic kidney disease) stage 3, GFR 30-59 ml/min (HCC) Lab Results  Component Value Date   CREATININE 1.68 (H) 10/31/2023   Stable overall, cont to avoid nephrotoxins   Essential hypertension BP Readings from Last 3 Encounters:  10/31/23 120/68  09/10/23 124/82  05/30/23 112/80   Stable, pt to continue medical treatment card cd 180 every day, hydralazine 100 tid, hct 25 qd   Hyperlipidemia associated with type 2 diabetes mellitus (HCC) Lab Results  Component Value Date   LDLCALC 51 10/31/2023   Stable, pt to continue current statin crestor 5 mg qd   Vitamin D deficiency Last vitamin D Lab Results  Component Value Date   VD25OH 60.45 10/31/2023   Stable, cont oral replacement  Followup: Return in about 6 months (around 05/02/2024).  Oliver Barre, MD 11/03/2023 9:19 PM [facility]

## 2023-10-31 NOTE — Progress Notes (Signed)
 The test results show that your current treatment is OK, as the tests are stable.  Please continue the same plan.  There is no other need for change of treatment or further evaluation based on these results, at this time.  thanks

## 2023-11-03 ENCOUNTER — Encounter: Payer: Self-pay | Admitting: Internal Medicine

## 2023-11-03 NOTE — Assessment & Plan Note (Signed)
 Last vitamin D Lab Results  Component Value Date   VD25OH 60.45 10/31/2023   Stable, cont oral replacement

## 2023-11-03 NOTE — Assessment & Plan Note (Signed)
 Lab Results  Component Value Date   HGBA1C 6.3 10/31/2023   Stable, pt to continue current medical treatment jardiance 25 every day,

## 2023-11-03 NOTE — Assessment & Plan Note (Signed)
 Lab Results  Component Value Date   CREATININE 1.68 (H) 10/31/2023   Stable overall, cont to avoid nephrotoxins

## 2023-11-03 NOTE — Assessment & Plan Note (Signed)
 Age and sex appropriate education and counseling updated with regular exercise and diet Referrals for preventative services - none needed Immunizations addressed - for prevnar 20 today, for tdap and shingrix at pharmacy Smoking counseling  - none needed Evidence for depression or other mood disorder - none significant Most recent labs reviewed. I have personally reviewed and have noted: 1) the patient's medical and social history 2) The patient's current medications and supplements 3) The patient's height, weight, and BMI have been recorded in the chart

## 2023-11-03 NOTE — Assessment & Plan Note (Signed)
 Lab Results  Component Value Date   LDLCALC 51 10/31/2023   Stable, pt to continue current statin crestor 5 mg qd

## 2023-11-03 NOTE — Assessment & Plan Note (Signed)
 BP Readings from Last 3 Encounters:  10/31/23 120/68  09/10/23 124/82  05/30/23 112/80   Stable, pt to continue medical treatment card cd 180 every day, hydralazine 100 tid, hct 25 qd

## 2023-11-21 DIAGNOSIS — H401132 Primary open-angle glaucoma, bilateral, moderate stage: Secondary | ICD-10-CM | POA: Diagnosis not present

## 2023-11-21 DIAGNOSIS — H25813 Combined forms of age-related cataract, bilateral: Secondary | ICD-10-CM | POA: Diagnosis not present

## 2023-11-21 LAB — HM DIABETES EYE EXAM

## 2023-11-29 ENCOUNTER — Other Ambulatory Visit: Payer: Self-pay | Admitting: Internal Medicine

## 2023-11-30 ENCOUNTER — Other Ambulatory Visit: Payer: Self-pay

## 2023-12-26 ENCOUNTER — Ambulatory Visit (INDEPENDENT_AMBULATORY_CARE_PROVIDER_SITE_OTHER): Admitting: Podiatry

## 2023-12-26 DIAGNOSIS — B353 Tinea pedis: Secondary | ICD-10-CM | POA: Diagnosis not present

## 2023-12-26 DIAGNOSIS — M79674 Pain in right toe(s): Secondary | ICD-10-CM

## 2023-12-26 DIAGNOSIS — B351 Tinea unguium: Secondary | ICD-10-CM

## 2023-12-26 DIAGNOSIS — M79675 Pain in left toe(s): Secondary | ICD-10-CM

## 2023-12-26 MED ORDER — CLOTRIMAZOLE-BETAMETHASONE 1-0.05 % EX CREA: 1.0000 | TOPICAL_CREAM | Freq: Every day | CUTANEOUS | 0 refills | Status: AC

## 2023-12-26 NOTE — Progress Notes (Signed)
  Subjective:  Patient ID: Roberto Rivera, male    DOB: 1950-10-23,  MRN: [id number]  Chief Complaint  Patient presents with   Nail Problem    Nail trim    73 y.o. male returns for the above complaint.  Patient presents with thickened elongated dystrophic toenails x10.  Patient is on blood thinner.  He would like to have the nails debrided down as he is not able to do it himself.  He states is mildly painful to touch.   Objective:  There were no vitals filed for this visit. Podiatric Exam: Vascular: dorsalis pedis and posterior tibial pulses are palpable bilateral. Capillary return is immediate. Temperature gradient is WNL. Skin turgor WNL  Sensorium: Normal Semmes Weinstein monofilament test. Normal tactile sensation bilaterally. Nail Exam: Pt has thick disfigured discolored nails with subungual debris noted bilateral entire nail hallux through fifth toenails.  Pain on palpation to the nails. Ulcer Exam: There is no evidence of ulcer or pre-ulcerative changes or infection. Orthopedic Exam: Muscle tone and strength are WNL. No limitations in general ROM. No crepitus or effusions noted. HAV  B/L.  Hammer toes 2-5  B/L. Skin: No Porokeratosis. No infection or ulcers.  Bilateral athlete's foot noted with subjective component of itching as well as epidermal lysis.    Assessment & Plan:   No diagnosis found.  Patient was evaluated and treated and all questions answered.  Bilateral athlete's foot - I discussed the patient with the etiology of athlete's foot and worse treatment options were discussed.  Patient will benefit from Lotrisone  cream Lotrisone  cream was sent to the pharmacy.  Advised him at least apply once a day he states understanding  Onychomycosis with pain  -Nails palliatively debrided as below. -Educated on self-care  Procedure: Nail Debridement Rationale: pain  Type of Debridement: manual, sharp debridement. Instrumentation: Nail nipper, rotary burr. Number of Nails:  10  Procedures and Treatment: Consent by patient was obtained for treatment procedures. The patient understood the discussion of treatment and procedures well. All questions were answered thoroughly reviewed. Debridement of mycotic and hypertrophic toenails, 1 through 5 bilateral and clearing of subungual debris. No ulceration, no infection noted.  Return Visit-Office Procedure: Patient instructed to return to the office for a follow up visit 3 months for continued evaluation and treatment.  Tinnie Forehand, DPM    No follow-ups on file.

## 2024-02-14 ENCOUNTER — Ambulatory Visit: Payer: Medicare Other

## 2024-02-14 VITALS — Ht 71.0 in | Wt 219.0 lb

## 2024-02-14 DIAGNOSIS — Z0000 Encounter for general adult medical examination without abnormal findings: Secondary | ICD-10-CM | POA: Diagnosis not present

## 2024-02-14 NOTE — Progress Notes (Signed)
 Subjective:   Roberto Rivera is a 73 y.o. who presents for a Medicare Wellness preventive visit.  As a reminder, Annual Wellness Visits don't include a physical exam, and some assessments may be limited, especially if this visit is performed virtually. We may recommend an in-person follow-up visit with your provider if needed.  Visit Complete: Virtual I connected with  [name] on 02/14/24 by a audio enabled telemedicine application and verified that I am speaking with the correct person using two identifiers.  Patient Location: Home  Provider Location: Office/Clinic  I discussed the limitations of evaluation and management by telemedicine. The patient expressed understanding and agreed to proceed.  Vital Signs: Because this visit was a virtual/telehealth visit, some criteria may be missing or patient reported. Any vitals not documented were not able to be obtained and vitals that have been documented are patient reported.  VideoDeclined- This patient declined Librarian, academic. Therefore the visit was completed with audio only.  Persons Participating in Visit: Patient.  AWV Questionnaire: No: Patient Medicare AWV questionnaire was not completed prior to this visit.  Cardiac Risk Factors include: advanced age (>39men, >76 women);male gender;hypertension;diabetes mellitus;dyslipidemia;Other (see comment)     Objective:    Today's Vitals   02/14/24 0858  Weight: 219 lb (99.3 kg)  Height: 5' 11 (1.803 m)   Body mass index is 30.54 kg/m.     02/14/2024    9:04 AM 02/08/2023    8:44 AM 03/06/2022    3:13 PM 11/03/2020    9:26 AM 09/28/2020   10:27 AM 07/19/2020    3:05 PM 07/01/2020    2:51 AM  Advanced Directives  Does Patient Have a Medical Advance Directive? No No No No No No No  Would patient like information on creating a medical advance directive?  No - Patient declined No - Patient declined No - Patient declined No - Patient declined No - Patient  declined No - Patient declined    Current Medications (verified) Outpatient Encounter Medications as of 02/14/2024  Medication Sig   Accu-Chek FastClix Lancets MISC USE TO CHECK ONCE DAILY   ACCU-CHEK GUIDE TEST test strip CHECK ONCE DAILY   albuterol  (VENTOLIN  HFA) 108 (90 Base) MCG/ACT inhaler Inhale 2 puffs into the lungs every 6 (six) hours as needed for wheezing or shortness of breath. (Patient taking differently: Inhale 2 puffs into the lungs 2 (two) times daily as needed for wheezing or shortness of breath.)   ammonium lactate  (AMLACTIN DAILY) 12 % lotion Apply 1 Application topically as needed for dry skin.   ammonium lactate  (AMLACTIN) 12 % cream Apply 1 Application topically as needed for dry skin.   apixaban  (ELIQUIS ) 5 MG TABS tablet Take 1 tablet (5 mg total) by mouth 2 (two) times daily.   apixaban  (ELIQUIS ) 5 MG TABS tablet Take 1 tablet (5 mg total) by mouth 2 (two) times daily.   brimonidine (ALPHAGAN) 0.2 % ophthalmic solution 1 drop 2 (two) times daily.   Cholecalciferol  50 MCG (2000 UT) TABS 1 tab by mouth once daily (Patient taking differently: Take 2,000 Units by mouth daily.)   clotrimazole -betamethasone  (LOTRISONE ) cream Apply 1 Application topically daily.   diltiazem  (CARDIZEM  CD) 180 MG 24 hr capsule Take 1 capsule (180 mg total) by mouth every evening.   dorzolamide (TRUSOPT) 2 % ophthalmic solution 1 drop 2 (two) times daily.   empagliflozin  (JARDIANCE ) 25 MG TABS tablet Take 1 tablet (25 mg total) by mouth daily before breakfast.   hydrALAZINE  (  APRESOLINE ) 100 MG tablet TAKE 1 TABLET(100 MG) BY MOUTH THREE TIMES DAILY   hydrochlorothiazide  (HYDRODIURIL ) 25 MG tablet Take 1 tablet (25 mg total) by mouth daily.   isosorbide  dinitrate (ISORDIL ) 20 MG tablet TAKE 1 TABLET(20 MG) BY MOUTH THREE TIMES DAILY   ketoconazole  (NIZORAL ) 2 % cream Apply 1 Application topically daily.   ketorolac (ACULAR) 0.5 % ophthalmic solution Per patient for Cataract surgery in November  2024   latanoprost (XALATAN) 0.005 % ophthalmic solution 1 drop at bedtime.   metoprolol  tartrate (LOPRESSOR ) 50 MG tablet Take 1 tablet (50 mg total) by mouth 2 (two) times daily.   nitroGLYCERIN  (NITROSTAT ) 0.4 MG SL tablet Place 1 tablet (0.4 mg total) under the tongue every 5 (five) minutes as needed for chest pain.   ofloxacin (OCUFLOX) 0.3 % ophthalmic solution Per patient for Cataract surgery in November 2024   potassium chloride  (KLOR-CON  10) 10 MEQ tablet Take 1 tablet (10 mEq total) by mouth daily.   prednisoLONE acetate (PRED FORTE) 1 % ophthalmic suspension Per patient for cataract surgery in November 2024   rosuvastatin  (CRESTOR ) 5 MG tablet TAKE 1 TABLET(5 MG) BY MOUTH DAILY AT BEDTIME   No facility-administered encounter medications on file as of 02/14/2024.    Allergies (verified) Patient has no known allergies.   History: Past Medical History:  Diagnosis Date   ALLERGIC RHINITIS 04/01/2007   Diabetes mellitus    DIABETES MELLITUS, TYPE II 04/01/2007   ERECTILE DYSFUNCTION, ORGANIC 02/07/2008   Hypercholesteremia    HYPERCHOLESTEROLEMIA 02/07/2008   Hypertension    HYPERTENSION 04/01/2007   MICROCYTOSIS 02/07/2008   OSTEOARTHRITIS, SPINE 02/07/2008   PAF (paroxysmal atrial fibrillation) (HCC) 07/11/2020   Stroke (HCC)    THYROID  CYST 02/07/2008   Past Surgical History:  Procedure Laterality Date   cyst removal  01/2021   back   LEFT HEART CATH AND CORONARY ANGIOGRAPHY N/A 09/28/2020   Procedure: LEFT HEART CATH AND CORONARY ANGIOGRAPHY;  Surgeon: Elmira Newman PARAS, MD;  Location: [facility];  Service: Cardiovascular;  Laterality: N/A;   Family History  Problem Relation Age of Onset   Cancer Mother        had uncertain type of cancer   Stroke Sister    Social History   Socioeconomic History   Marital status: Significant Other    Spouse name: Pensions consultant   Number of children: 2   Years of education: Not on file   Highest education level: Not on file   Occupational History   Occupation: RETIRED/Textile    Employer: [employer]  Tobacco Use   Smoking status: Former    Current packs/day: 0.00    Types: Cigarettes    Quit date: 06/23/2020    Years since quitting: 3.6   Smokeless tobacco: Never  Vaping Use   Vaping status: Never Used  Substance and Sexual Activity   Alcohol use: No    Alcohol/week: 0.0 standard drinks of alcohol   Drug use: No   Sexual activity: Not on file  Other Topics Concern   Not on file  Social History Narrative   Divorced x many years      Lives with significant other/2025   Social Drivers of Health   Financial Resource Strain: Low Risk  (02/14/2024)   Overall Financial Resource Strain (CARDIA)    Difficulty of Paying Living Expenses: Not very hard  Food Insecurity: No Food Insecurity (02/14/2024)   Hunger Vital Sign    Worried About Running Out of Food in the  Last Year: Never true    Ran Out of Food in the Last Year: Never true  Transportation Needs: No Transportation Needs (02/14/2024)   PRAPARE - Administrator, Civil Service (Medical): No    Lack of Transportation (Non-Medical): No  Physical Activity: Inactive (02/14/2024)   Exercise Vital Sign    Days of Exercise per Week: 0 days    Minutes of Exercise per Session: 0 min  Stress: No Stress Concern Present (02/14/2024)   [employer] of Occupational Health - Occupational Stress Questionnaire    Feeling of Stress: Not at all  Social Connections: Moderately Isolated (02/14/2024)   Social Connection and Isolation Panel    Frequency of Communication with Friends and Family: More than three times a week    Frequency of Social Gatherings with Friends and Family: More than three times a week    Attends Religious Services: Never    Database administrator or Organizations: No    Attends Engineer, structural: Never    Marital Status: Living with partner    Tobacco Counseling Counseling given: Not Answered    Clinical  Intake:     Pain : No/denies pain     BMI - recorded: 30.54 Nutritional Status: BMI > 30  Obese Nutritional Risks: None Diabetes: Yes CBG done?: Yes (per pt-130) CBG resulted in Enter/ Edit results?: No Did pt. bring in CBG monitor from home?: No  Lab Results  Component Value Date   HGBA1C 6.3 10/31/2023   HGBA1C 6.4 05/02/2023   HGBA1C 5.4 10/06/2022     How often do you need to have someone help you when you read instructions, pamphlets, or other written materials from your doctor or pharmacy?: 1 - Never  Interpreter Needed?: No  Information entered by :: Alim Cattell, RMA   Activities of Daily Living     02/14/2024    9:00 AM  In your present state of health, do you have any difficulty performing the following activities:  Hearing? 0  Vision? 0  Difficulty concentrating or making decisions? 0  Walking or climbing stairs? 0  Dressing or bathing? 0  Doing errands, shopping? 0  Preparing Food and eating ? N  Using the Toilet? N  In the past six months, have you accidently leaked urine? N  Do you have problems with loss of bowel control? N  Managing your Medications? N  Managing your Finances? N  Housekeeping or managing your Housekeeping? N    Patient Care Team: Norleen Lynwood ORN, MD as PCP - General (Internal Medicine) Elmira Newman PARAS, MD as PCP - Cardiology (Cardiology) Zan Factor, DPM (Podiatry) Szabat, Toribio BROCKS, [facility] (Inactive) as Pharmacist (Pharmacist)  I have updated your Care Teams any recent Medical Services you may have received from other providers in the past year.     Assessment:   This is a routine wellness examination for [place].  Hearing/Vision screen Hearing Screening - Comments:: Denies hearing difficulties   Vision Screening - Comments:: Wears eyeglasses/Dr. Octavia and Dr. Alvia   Goals Addressed               This Visit's Progress     No current goals (pt-stated)   On track      Depression Screen     02/14/2024     9:06 AM 10/31/2023    9:09 AM 09/10/2023    8:12 AM 05/02/2023    9:09 AM 02/08/2023    8:42 AM 10/06/2022    9:15  AM 05/11/2022    9:56 AM  PHQ 2/9 Scores  PHQ - 2 Score 0 0 0 5 0 0 0  PHQ- 9 Score 1   7  0 0    Fall Risk     02/14/2024    9:04 AM 10/31/2023    9:12 AM 09/10/2023    8:12 AM 05/02/2023    9:09 AM 02/08/2023    8:43 AM  Fall Risk   Falls in the past year? 0 0 0 0 0  Number falls in past yr: 0 0 0 0 0  Injury with Fall? 0 0 0 0 0  Risk for fall due to :  No Fall Risks No Fall Risks No Fall Risks No Fall Risks  Follow up Falls evaluation completed;Falls prevention discussed Falls evaluation completed Falls evaluation completed Falls evaluation completed Falls prevention discussed    MEDICARE RISK AT HOME:  Medicare Risk at Home Any stairs in or around the home?: No If so, are there any without handrails?: No Home free of loose throw rugs in walkways, pet beds, electrical cords, etc?: Yes Adequate lighting in your home to reduce risk of falls?: Yes Life alert?: No Use of a cane, walker or w/c?: No Grab bars in the bathroom?: Yes Shower chair or bench in shower?: No Elevated toilet seat or a handicapped toilet?: No  TIMED UP AND GO:  Was the test performed?  No  Cognitive Function: Declined/Normal: No cognitive concerns noted by patient or family. Patient alert, oriented, able to answer questions appropriately and recall recent events. No signs of memory loss or confusion.        02/08/2023    8:44 AM 03/06/2022    3:13 PM  6CIT Screen  What Year? 0 points 0 points  What month? 0 points 0 points  What time? 0 points --  Count back from 20 0 points --  Months in reverse 0 points --  Repeat phrase 0 points --  Total Score 0 points     Immunizations Immunization History  Administered Date(s) Administered   PFIZER(Purple Top)SARS-COV-2 Vaccination 04/23/2020, 05/14/2020   PNEUMOCOCCAL CONJUGATE-20 10/31/2023   Pneumococcal Conjugate-13 12/25/2017    Pneumococcal Polysaccharide-23 02/04/2010, 01/28/2020   Td 01/29/2009   Zoster, Live 12/14/2011    Screening Tests Health Maintenance  Topic Date Due   Zoster Vaccines- Shingrix (1 of 2) 10/14/1970   DTaP/Tdap/Td (2 - Tdap) 01/30/2019   Medicare Annual Wellness (AWV)  02/08/2024   Colonoscopy  07/03/2024 (Originally 10/14/1996)   INFLUENZA VACCINE  03/21/2024   OPHTHALMOLOGY EXAM  03/21/2024   HEMOGLOBIN A1C  05/02/2024   Diabetic kidney evaluation - eGFR measurement  10/30/2024   Diabetic kidney evaluation - Urine ACR  10/30/2024   FOOT EXAM  10/30/2024   Pneumococcal Vaccine: 50+ Years  Completed   Hepatitis C Screening  Completed   Hepatitis B Vaccines  Aged Out   HPV VACCINES  Aged Out   Meningococcal B Vaccine  Aged Out   COVID-19 Vaccine  Discontinued    Health Maintenance  Health Maintenance Due  Topic Date Due   Zoster Vaccines- Shingrix (1 of 2) 10/14/1970   DTaP/Tdap/Td (2 - Tdap) 01/30/2019   Medicare Annual Wellness (AWV)  02/08/2024   Health Maintenance Items Addressed: See Nurse Notes at the end of this note  Additional Screening:  Vision Screening: Recommended annual ophthalmology exams for early detection of glaucoma and other disorders of the eye. Would you like a referral to an eye doctor?  No    Dental Screening: Recommended annual dental exams for proper oral hygiene  Community Resource Referral / Chronic Care Management: CRR required this visit?  No   CCM required this visit?  No   Plan:    I have personally reviewed and noted the following in the patient's chart:   Medical and social history Use of alcohol, tobacco or illicit drugs  Current medications and supplements including opioid prescriptions. Patient is not currently taking opioid prescriptions. Functional ability and status Nutritional status Physical activity Advanced directives List of other physicians Hospitalizations, surgeries, and ER visits in previous 12  months Vitals Screenings to include cognitive, depression, and falls Referrals and appointments  In addition, I have reviewed and discussed with patient certain preventive protocols, quality metrics, and best practice recommendations. A written personalized care plan for preventive services as well as general preventive health recommendations were provided to patient.   Alyxander Kollmann L Ernesto Zukowski, CMA   02/14/2024   After Visit Summary: (MyChart) Due to this being a telephonic visit, the after visit summary with patients personalized plan was offered to patient via MyChart   Notes: Patient declines all vaccines at this time.  He stated that he has had an eye exam this year.  I have sent a request to get his eye exam results today.  Patient had no concerns to address today.

## 2024-02-14 NOTE — Patient Instructions (Signed)
 Roberto Rivera , Thank you for taking time out of your busy schedule to complete your Annual Wellness Visit with me. I enjoyed our conversation and look forward to speaking with you again next year. I, as well as your care team,  appreciate your ongoing commitment to your health goals. Please review the following plan we discussed and let me know if I can assist you in the future. Your Game plan/ To Do List    Follow up Visits: Next Medicare AWV with our clinical staff: 02/18/2025.   Have you seen your provider in the last 6 months (3 months if uncontrolled diabetes)? Yes Next Office Visit with your provider: 04/30/2024.  Clinician Recommendations:  Aim for 30 minutes of exercise or brisk walking, 6-8 glasses of water, and 5 servings of fruits and vegetables each day. See you soon and stay active.      This is a list of the screening recommended for you and due dates:  Health Maintenance  Topic Date Due   Zoster (Shingles) Vaccine (1 of 2) 10/14/1970   DTaP/Tdap/Td vaccine (2 - Tdap) 01/30/2019   Medicare Annual Wellness Visit  02/08/2024   Colon Cancer Screening  07/03/2024*   Flu Shot  03/21/2024   Eye exam for diabetics  03/21/2024   Hemoglobin A1C  05/02/2024   Yearly kidney function blood test for diabetes  10/30/2024   Yearly kidney health urinalysis for diabetes  10/30/2024   Complete foot exam   10/30/2024   Pneumococcal Vaccine for age over 79  Completed   Hepatitis C Screening  Completed   Hepatitis B Vaccine  Aged Out   HPV Vaccine  Aged Out   Meningitis B Vaccine  Aged Out   COVID-19 Vaccine  Discontinued  *Topic was postponed. The date shown is not the original due date.    Advanced directives: (Declined) Advance directive discussed with you today. Even though you declined this today, please call our office should you change your mind, and we can give you the proper paperwork for you to fill out. Advance Care Planning is important because it:  [x]  Makes sure you receive the  medical care that is consistent with your values, goals, and preferences  [x]  It provides guidance to your family and loved ones and reduces their decisional burden about whether or not they are making the right decisions based on your wishes.  Follow the link provided in your after visit summary or read over the paperwork we have mailed to you to help you started getting your Advance Directives in place. If you need assistance in completing these, please reach out to us  so that we can help you!  See attachments for Preventive Care and Fall Prevention Tips.

## 2024-02-17 ENCOUNTER — Other Ambulatory Visit: Payer: Self-pay | Admitting: Cardiology

## 2024-02-26 ENCOUNTER — Telehealth: Payer: Self-pay | Admitting: Podiatry

## 2024-02-26 NOTE — Telephone Encounter (Signed)
 Called patient to reschedule appointment from 02/27/24 per his request. Unable to leave message as phone just kept ringing and ringing

## 2024-02-27 ENCOUNTER — Ambulatory Visit (INDEPENDENT_AMBULATORY_CARE_PROVIDER_SITE_OTHER): Admitting: Podiatry

## 2024-02-27 DIAGNOSIS — M79674 Pain in right toe(s): Secondary | ICD-10-CM | POA: Diagnosis not present

## 2024-02-27 DIAGNOSIS — M79675 Pain in left toe(s): Secondary | ICD-10-CM

## 2024-02-27 DIAGNOSIS — B351 Tinea unguium: Secondary | ICD-10-CM | POA: Diagnosis not present

## 2024-02-27 NOTE — Progress Notes (Signed)
  Subjective:  Patient ID: Roberto Rivera, male    DOB: [birth year],  MRN: [id number]  Chief Complaint  Patient presents with   Tinea Pedis    Pt stated that he is here for a follow up he stated that he is doing well    73 y.o. male returns for the above complaint.  Patient presents with thickened elongated dystrophic toenails x10.  Patient is on blood thinner.  He would like to have the nails debrided down as he is not able to do it himself.  He states is mildly painful to touch.   Objective:  There were no vitals filed for this visit. Podiatric Exam: Vascular: dorsalis pedis and posterior tibial pulses are palpable bilateral. Capillary return is immediate. Temperature gradient is WNL. Skin turgor WNL  Sensorium: Normal Semmes Weinstein monofilament test. Normal tactile sensation bilaterally. Nail Exam: Pt has thick disfigured discolored nails with subungual debris noted bilateral entire nail hallux through fifth toenails.  Pain on palpation to the nails. Ulcer Exam: There is no evidence of ulcer or pre-ulcerative changes or infection. Orthopedic Exam: Muscle tone and strength are WNL. No limitations in general ROM. No crepitus or effusions noted. HAV  B/L.  Hammer toes 2-5  B/L. Skin: No Porokeratosis. No infection or ulcers.     Assessment & Plan:   1. Pain due to onychomycosis of toenails of both feet         Patient was evaluated and treated and all questions answered.    Onychomycosis with pain  -Nails palliatively debrided as below. -Educated on self-care  Procedure: Nail Debridement Rationale: pain  Type of Debridement: manual, sharp debridement. Instrumentation: Nail nipper, rotary burr. Number of Nails: 10  Procedures and Treatment: Consent by patient was obtained for treatment procedures. The patient understood the discussion of treatment and procedures well. All questions were answered thoroughly reviewed. Debridement of mycotic and hypertrophic toenails, 1 through 5  bilateral and clearing of subungual debris. No ulceration, no infection noted.  Return Visit-Office Procedure: Patient instructed to return to the office for a follow up visit 3 months for continued evaluation and treatment.  [name], DPM    No follow-ups on file.

## 2024-03-26 ENCOUNTER — Encounter (INDEPENDENT_AMBULATORY_CARE_PROVIDER_SITE_OTHER): Payer: Medicare Other | Admitting: Ophthalmology

## 2024-03-26 DIAGNOSIS — H43813 Vitreous degeneration, bilateral: Secondary | ICD-10-CM | POA: Diagnosis not present

## 2024-03-26 DIAGNOSIS — H353132 Nonexudative age-related macular degeneration, bilateral, intermediate dry stage: Secondary | ICD-10-CM

## 2024-03-26 DIAGNOSIS — I10 Essential (primary) hypertension: Secondary | ICD-10-CM

## 2024-03-26 DIAGNOSIS — H2513 Age-related nuclear cataract, bilateral: Secondary | ICD-10-CM

## 2024-03-26 DIAGNOSIS — D3132 Benign neoplasm of left choroid: Secondary | ICD-10-CM | POA: Diagnosis not present

## 2024-03-26 DIAGNOSIS — H35033 Hypertensive retinopathy, bilateral: Secondary | ICD-10-CM | POA: Diagnosis not present

## 2024-04-30 ENCOUNTER — Other Ambulatory Visit: Payer: Self-pay | Admitting: Internal Medicine

## 2024-04-30 ENCOUNTER — Ambulatory Visit: Payer: Self-pay | Admitting: Internal Medicine

## 2024-04-30 ENCOUNTER — Encounter: Payer: Self-pay | Admitting: Internal Medicine

## 2024-04-30 ENCOUNTER — Ambulatory Visit: Admitting: Internal Medicine

## 2024-04-30 VITALS — BP 100/68 | HR 61 | Temp 98.2°F | Ht 71.0 in | Wt 220.4 lb

## 2024-04-30 DIAGNOSIS — E785 Hyperlipidemia, unspecified: Secondary | ICD-10-CM

## 2024-04-30 DIAGNOSIS — R972 Elevated prostate specific antigen [PSA]: Secondary | ICD-10-CM

## 2024-04-30 DIAGNOSIS — E559 Vitamin D deficiency, unspecified: Secondary | ICD-10-CM | POA: Diagnosis not present

## 2024-04-30 DIAGNOSIS — Z7984 Long term (current) use of oral hypoglycemic drugs: Secondary | ICD-10-CM

## 2024-04-30 DIAGNOSIS — E1169 Type 2 diabetes mellitus with other specified complication: Secondary | ICD-10-CM | POA: Diagnosis not present

## 2024-04-30 DIAGNOSIS — I10 Essential (primary) hypertension: Secondary | ICD-10-CM

## 2024-04-30 DIAGNOSIS — E1122 Type 2 diabetes mellitus with diabetic chronic kidney disease: Secondary | ICD-10-CM | POA: Diagnosis not present

## 2024-04-30 DIAGNOSIS — E1121 Type 2 diabetes mellitus with diabetic nephropathy: Secondary | ICD-10-CM

## 2024-04-30 DIAGNOSIS — N1831 Chronic kidney disease, stage 3a: Secondary | ICD-10-CM | POA: Diagnosis not present

## 2024-04-30 LAB — LIPID PANEL
Cholesterol: 100 mg/dL (ref 0–200)
HDL: 46.6 mg/dL (ref >39.00)
LDL Cholesterol: 41 mg/dL (ref 0–99)
NonHDL: 53.6
Total CHOL/HDL Ratio: 2
Triglycerides: 64 mg/dL (ref 0.0–149.0)
VLDL: 12.8 mg/dL (ref 0.0–40.0)

## 2024-04-30 LAB — PSA: PSA: 4.26 ng/mL — ABNORMAL HIGH (ref 0.10–4.00)

## 2024-04-30 LAB — HEPATIC FUNCTION PANEL
ALT: 13 U/L (ref 0–53)
AST: 14 U/L (ref 0–37)
Albumin: 3.9 g/dL (ref 3.5–5.2)
Alkaline Phosphatase: 80 U/L (ref 39–117)
Bilirubin, Direct: 0.1 mg/dL (ref 0.0–0.3)
Total Bilirubin: 0.5 mg/dL (ref 0.2–1.2)
Total Protein: 6.8 g/dL (ref 6.0–8.3)

## 2024-04-30 LAB — BASIC METABOLIC PANEL WITH GFR
BUN: 33 mg/dL — ABNORMAL HIGH (ref 6–23)
CO2: 29 meq/L (ref 19–32)
Calcium: 9.7 mg/dL (ref 8.4–10.5)
Chloride: 101 meq/L (ref 96–112)
Creatinine, Ser: 1.79 mg/dL — ABNORMAL HIGH (ref 0.40–1.50)
GFR: 37.38 mL/min — ABNORMAL LOW (ref >60.00)
Glucose, Bld: 108 mg/dL — ABNORMAL HIGH (ref 70–99)
Potassium: 3.1 meq/L — ABNORMAL LOW (ref 3.5–5.1)
Sodium: 141 meq/L (ref 135–145)

## 2024-04-30 LAB — HEMOGLOBIN A1C: Hgb A1c MFr Bld: 6.4 % (ref 4.6–6.5)

## 2024-04-30 MED ORDER — EMPAGLIFLOZIN 25 MG PO TABS: 25.0000 mg | ORAL_TABLET | Freq: Every day | ORAL | 3 refills | Status: AC

## 2024-04-30 NOTE — Assessment & Plan Note (Signed)
 With steady recent worsening, for f/u psa today, for urology referral  Lab Results  Component Value Date   PSA 4.26 (H) 04/30/2024   PSA 3.05 10/31/2023   PSA 2.65 10/06/2022

## 2024-04-30 NOTE — Progress Notes (Signed)
 Patient ID: Roberto Rivera, male   DOB: 1950-10-02, 73 y.o.   MRN: [id number]        Chief Complaint: follow up dm, increased psa velocity, ckd3a, htn, hld, low vit d        HPI:  Roberto Rivera is a 73 y.o. male here overall doing ok.  Pt denies chest pain, increased sob or doe, wheezing, orthopnea, PND, increased LE swelling, palpitations, dizziness or syncope.  Pt denies polydipsia, polyuria, or new focal neuro s/s.  Pt now has bilateral cataract and glaucoma both eyes with regular optho follow up.        Wt Readings from Last 3 Encounters:  04/30/24 220 lb 6.4 oz (100 kg)  02/14/24 219 lb (99.3 kg)  10/31/23 219 lb (99.3 kg)   BP Readings from Last 3 Encounters:  04/30/24 100/68  10/31/23 120/68  09/10/23 124/82         Past Medical History:  Diagnosis Date   ALLERGIC RHINITIS 04/01/2007   Diabetes mellitus    DIABETES MELLITUS, TYPE II 04/01/2007   ERECTILE DYSFUNCTION, ORGANIC 02/07/2008   Hypercholesteremia    HYPERCHOLESTEROLEMIA 02/07/2008   Hypertension    HYPERTENSION 04/01/2007   MICROCYTOSIS 02/07/2008   OSTEOARTHRITIS, SPINE 02/07/2008   PAF (paroxysmal atrial fibrillation) (HCC) 07/11/2020   Stroke (HCC)    THYROID  CYST 02/07/2008   Past Surgical History:  Procedure Laterality Date   cyst removal  01/2021   back   LEFT HEART CATH AND CORONARY ANGIOGRAPHY N/A 09/28/2020   Procedure: LEFT HEART CATH AND CORONARY ANGIOGRAPHY;  Surgeon: Elmira Newman PARAS, MD;  Location: [facility];  Service: Cardiovascular;  Laterality: N/A;    reports that he quit smoking about 3 years ago. His smoking use included cigarettes. He has never used smokeless tobacco. He reports that he does not drink alcohol and does not use drugs. family history includes Cancer in his mother; Stroke in his sister. No Known Allergies Current Outpatient Medications on File Prior to Visit  Medication Sig Dispense Refill   Accu-Chek FastClix Lancets MISC USE TO CHECK ONCE DAILY 102 each 3   ACCU-CHEK GUIDE  TEST test strip CHECK ONCE DAILY 100 strip 3   albuterol  (VENTOLIN  HFA) 108 (90 Base) MCG/ACT inhaler Inhale 2 puffs into the lungs every 6 (six) hours as needed for wheezing or shortness of breath. (Patient taking differently: Inhale 2 puffs into the lungs 2 (two) times daily as needed for wheezing or shortness of breath.) 24 g 3   ammonium lactate  (AMLACTIN DAILY) 12 % lotion Apply 1 Application topically as needed for dry skin. 400 g 0   ammonium lactate  (AMLACTIN) 12 % cream Apply 1 Application topically as needed for dry skin.     apixaban  (ELIQUIS ) 5 MG TABS tablet Take 1 tablet (5 mg total) by mouth 2 (two) times daily. 180 tablet 3   apixaban  (ELIQUIS ) 5 MG TABS tablet Take 1 tablet (5 mg total) by mouth 2 (two) times daily. 56 tablet    brimonidine (ALPHAGAN) 0.2 % ophthalmic solution 1 drop 2 (two) times daily.     Cholecalciferol  50 MCG (2000 UT) TABS 1 tab by mouth once daily (Patient taking differently: Take 2,000 Units by mouth daily.) 30 tablet 99   clotrimazole -betamethasone  (LOTRISONE ) cream Apply 1 Application topically daily. 30 g 0   diltiazem  (CARDIZEM  CD) 180 MG 24 hr capsule TAKE 1 CAPSULE BY MOUTH IN THE  EVENING 100 capsule 2   dorzolamide (TRUSOPT) 2 % ophthalmic solution 1  drop 2 (two) times daily.     hydrALAZINE  (APRESOLINE ) 100 MG tablet TAKE 1 TABLET(100 MG) BY MOUTH THREE TIMES DAILY 270 tablet 2   hydrochlorothiazide  (HYDRODIURIL ) 25 MG tablet Take 1 tablet (25 mg total) by mouth daily. 90 tablet 3   isosorbide  dinitrate (ISORDIL ) 20 MG tablet TAKE 1 TABLET(20 MG) BY MOUTH THREE TIMES DAILY 270 tablet 2   ketoconazole  (NIZORAL ) 2 % cream Apply 1 Application topically daily. 30 g 2   ketorolac (ACULAR) 0.5 % ophthalmic solution Per patient for Cataract surgery in November 2024     latanoprost (XALATAN) 0.005 % ophthalmic solution 1 drop at bedtime.     metoprolol  tartrate (LOPRESSOR ) 50 MG tablet Take 1 tablet (50 mg total) by mouth 2 (two) times daily. 180 tablet 3    nitroGLYCERIN  (NITROSTAT ) 0.4 MG SL tablet Place 1 tablet (0.4 mg total) under the tongue every 5 (five) minutes as needed for chest pain. 90 tablet 3   ofloxacin (OCUFLOX) 0.3 % ophthalmic solution Per patient for Cataract surgery in November 2024     potassium chloride  (KLOR-CON  10) 10 MEQ tablet Take 1 tablet (10 mEq total) by mouth daily. 90 tablet 3   prednisoLONE acetate (PRED FORTE) 1 % ophthalmic suspension Per patient for cataract surgery in November 2024     rosuvastatin  (CRESTOR ) 5 MG tablet TAKE 1 TABLET(5 MG) BY MOUTH DAILY AT BEDTIME 90 tablet 3   No current facility-administered medications on file prior to visit.        ROS:  All others reviewed and negative.  Objective        PE:  BP 100/68   Pulse 61   Temp 98.2 F (36.8 C)   Ht 5' 11 (1.803 m)   Wt 220 lb 6.4 oz (100 kg)   SpO2 97%   BMI 30.74 kg/m                 Constitutional: Pt appears in NAD               HENT: Head: NCAT.                Right Ear: External ear normal.                 Left Ear: External ear normal.                Eyes: . Pupils are equal, round, and reactive to light. Conjunctivae and EOM are normal               Nose: without d/c or deformity               Neck: Neck supple. Gross normal ROM               Cardiovascular: Normal rate and regular rhythm.                 Pulmonary/Chest: Effort normal and breath sounds without rales or wheezing.                Abd:  Soft, NT, ND, + BS, no organomegaly               Neurological: Pt is alert. At baseline orientation, motor grossly intact               Skin: Skin is warm. No rashes, no other new lesions, LE edema - none  Psychiatric: Pt behavior is normal without agitation   Micro: none  Cardiac tracings I have personally interpreted today:  none  Pertinent Radiological findings (summarize): none   Lab Results  Component Value Date   WBC 6.8 10/31/2023   HGB 13.4 10/31/2023   HCT 41.8 10/31/2023   PLT 225.0  10/31/2023   GLUCOSE 108 (H) 04/30/2024   CHOL 100 04/30/2024   TRIG 64.0 04/30/2024   HDL 46.60 04/30/2024   LDLCALC 41 04/30/2024   ALT 13 04/30/2024   AST 14 04/30/2024   NA 141 04/30/2024   K 3.1 (L) 04/30/2024   CL 101 04/30/2024   CREATININE 1.79 (H) 04/30/2024   BUN 33 (H) 04/30/2024   CO2 29 04/30/2024   TSH 1.73 10/31/2023   PSA 4.26 (H) 04/30/2024   INR 1.0 06/30/2020   HGBA1C 6.4 04/30/2024   MICROALBUR 1.8 10/31/2023   Assessment/Plan:  Roberto Rivera is a 73 y.o. Black or African American [2] male with  has a past medical history of ALLERGIC RHINITIS (04/01/2007), Diabetes mellitus, DIABETES MELLITUS, TYPE II (04/01/2007), ERECTILE DYSFUNCTION, ORGANIC (02/07/2008), Hypercholesteremia, HYPERCHOLESTEROLEMIA (02/07/2008), Hypertension, HYPERTENSION (04/01/2007), MICROCYTOSIS (02/07/2008), OSTEOARTHRITIS, SPINE (02/07/2008), PAF (paroxysmal atrial fibrillation) (HCC) (07/11/2020), Stroke (HCC), and THYROID  CYST (02/07/2008).  CKD (chronic kidney disease) stage 3, GFR 30-59 ml/min (HCC) Lab Results  Component Value Date   CREATININE 1.79 (H) 04/30/2024   Stable overall, cont to avoid nephrotoxins   Diabetes (HCC) Lab Results  Component Value Date   HGBA1C 6.4 04/30/2024   Stable, pt to continue current medical treatment jardiance  25 qd   Essential hypertension BP Readings from Last 3 Encounters:  04/30/24 100/68  10/31/23 120/68  09/10/23 124/82   Stable, pt to continue medical treatment card cd 180 every day, hydralazine  100 tid, hct 25 every day, lopressor  50 bid   Hyperlipidemia associated with type 2 diabetes mellitus (HCC) Lab Results  Component Value Date   LDLCALC 41 04/30/2024   Stable, pt to continue current statin crestor  5 mg qd   Vitamin D  deficiency Last vitamin D  Lab Results  Component Value Date   VD25OH 60.45 10/31/2023   Stable, cont oral replacement   Increased prostate specific antigen (PSA) velocity With steady recent worsening, for  f/u psa today, for urology referral  Lab Results  Component Value Date   PSA 4.26 (H) 04/30/2024   PSA 3.05 10/31/2023   PSA 2.65 10/06/2022    Followup: Return in about 6 months (around 10/28/2024).  Lynwood Rush, MD 04/30/2024 8:11 PM [facility]

## 2024-04-30 NOTE — Assessment & Plan Note (Signed)
 Last vitamin D Lab Results  Component Value Date   VD25OH 60.45 10/31/2023   Stable, cont oral replacement

## 2024-04-30 NOTE — Assessment & Plan Note (Signed)
 Lab Results  Component Value Date   HGBA1C 6.4 04/30/2024   Stable, pt to continue current medical treatment jardiance  25 qd

## 2024-04-30 NOTE — Assessment & Plan Note (Signed)
 Lab Results  Component Value Date   CREATININE 1.79 (H) 04/30/2024   Stable overall, cont to avoid nephrotoxins

## 2024-04-30 NOTE — Patient Instructions (Signed)

## 2024-04-30 NOTE — Assessment & Plan Note (Signed)
 BP Readings from Last 3 Encounters:  04/30/24 100/68  10/31/23 120/68  09/10/23 124/82   Stable, pt to continue medical treatment card cd 180 every day, hydralazine  100 tid, hct 25 every day, lopressor  50 bid

## 2024-04-30 NOTE — Assessment & Plan Note (Signed)
 Lab Results  Component Value Date   LDLCALC 41 04/30/2024   Stable, pt to continue current statin crestor  5 mg qd

## 2024-05-07 ENCOUNTER — Other Ambulatory Visit: Payer: Self-pay

## 2024-05-07 DIAGNOSIS — I7121 Aneurysm of the ascending aorta, without rupture: Secondary | ICD-10-CM

## 2024-05-29 ENCOUNTER — Ambulatory Visit (HOSPITAL_COMMUNITY)
Admission: RE | Admit: 2024-05-29 | Discharge: 2024-05-29 | Disposition: A | Discharge status: Home / Self Care | Source: Ambulatory Visit | Attending: Cardiology | Admitting: Cardiology

## 2024-05-29 DIAGNOSIS — I7121 Aneurysm of the ascending aorta, without rupture: Secondary | ICD-10-CM | POA: Insufficient documentation

## 2024-05-29 DIAGNOSIS — I2510 Atherosclerotic heart disease of native coronary artery without angina pectoris: Secondary | ICD-10-CM | POA: Diagnosis not present

## 2024-05-29 MED ORDER — IOHEXOL 350 MG/ML SOLN
75.0000 mL | Freq: Once | INTRAVENOUS | Status: AC | PRN
  Administered 2024-05-29: 75 mL via INTRAVENOUS

## 2024-06-04 ENCOUNTER — Ambulatory Visit (INDEPENDENT_AMBULATORY_CARE_PROVIDER_SITE_OTHER): Admitting: Podiatry

## 2024-06-04 DIAGNOSIS — M79675 Pain in left toe(s): Secondary | ICD-10-CM

## 2024-06-04 DIAGNOSIS — M79674 Pain in right toe(s): Secondary | ICD-10-CM

## 2024-06-04 DIAGNOSIS — B351 Tinea unguium: Secondary | ICD-10-CM | POA: Diagnosis not present

## 2024-06-04 NOTE — Progress Notes (Signed)
  Subjective:  Patient ID: Roberto Rivera, male    DOB: 11/13/1950,  MRN: [id number]  Chief Complaint  Patient presents with   Nail Problem    Nail trim    73 y.o. male returns for the above complaint.  Patient presents with thickened elongated dystrophic toenails x10.  Patient is on blood thinner.  He would like to have the nails debrided down as he is not able to do it himself.  He states is mildly painful to touch.   Objective:  There were no vitals filed for this visit. Podiatric Exam: Vascular: dorsalis pedis and posterior tibial pulses are palpable bilateral. Capillary return is immediate. Temperature gradient is WNL. Skin turgor WNL  Sensorium: Normal Semmes Weinstein monofilament test. Normal tactile sensation bilaterally. Nail Exam: Pt has thick disfigured discolored nails with subungual debris noted bilateral entire nail hallux through fifth toenails.  Pain on palpation to the nails. Ulcer Exam: There is no evidence of ulcer or pre-ulcerative changes or infection. Orthopedic Exam: Muscle tone and strength are WNL. No limitations in general ROM. No crepitus or effusions noted. HAV  B/L.  Hammer toes 2-5  B/L. Skin: No Porokeratosis. No infection or ulcers.     Assessment & Plan:   1. Pain due to onychomycosis of toenails of both feet          Patient was evaluated and treated and all questions answered.    Onychomycosis with pain  -Nails palliatively debrided as below. -Educated on self-care  Procedure: Nail Debridement Rationale: pain  Type of Debridement: manual, sharp debridement. Instrumentation: Nail nipper, rotary burr. Number of Nails: 10  Procedures and Treatment: Consent by patient was obtained for treatment procedures. The patient understood the discussion of treatment and procedures well. All questions were answered thoroughly reviewed. Debridement of mycotic and hypertrophic toenails, 1 through 5 bilateral and clearing of subungual debris. No ulceration, no  infection noted.  Return Visit-Office Procedure: Patient instructed to return to the office for a follow up visit 3 months for continued evaluation and treatment.  [name], DPM    No follow-ups on file.

## 2024-06-12 ENCOUNTER — Ambulatory Visit

## 2024-06-12 VITALS — BP 116/69 | HR 70 | Resp 20 | Ht 71.0 in | Wt 216.0 lb

## 2024-06-12 DIAGNOSIS — I7121 Aneurysm of the ascending aorta, without rupture: Secondary | ICD-10-CM | POA: Diagnosis not present

## 2024-06-12 NOTE — Patient Instructions (Signed)

## 2024-06-12 NOTE — Progress Notes (Signed)
 [address] [place] [address]             [phone]            Ancel Easler [id number] 11/22/1950   History of Present Illness:  Roberto Rivera is a 73 year old male with medical history of hypertension, CVA, paroxysmal atrial fibrillation, CAD, type 2 diabetes osteoarthritis chronic kidney disease stage III tobacco use and hyperlipidemia who presents for continued follow-up of ascending thoracic aortic aneurysm.  Aneurysm has stayed stable in size for the past years measuring 4.0 cm-4.5 cm.  On recent CTA chest aneurysm measured 4.4 cm.  Echocardiogram from 2023 showed tricuspid aortic valve.  He presents to the clinic today and reports that he is doing well.  His blood pressure is well controlled with current medication therapy.  His home readings are 120s-130s/70s-80s.  He is active around his house with chores and denies heavy lifting.  He does have some shortness of breath with exertion which improves with rest.  He has been having a harder time exercising due to osteoarthritis of his spine and knees.  He denies chest pain and lower leg swelling.    Current Outpatient Medications on File Prior to Visit  Medication Sig Dispense Refill   Accu-Chek FastClix Lancets MISC USE TO CHECK ONCE DAILY 102 each 3   ACCU-CHEK GUIDE TEST test strip CHECK ONCE DAILY 100 strip 3   albuterol  (VENTOLIN  HFA) 108 (90 Base) MCG/ACT inhaler Inhale 2 puffs into the lungs every 6 (six) hours as needed for wheezing or shortness of breath. (Patient taking differently: Inhale 2 puffs into the lungs 2 (two) times daily as needed for wheezing or shortness of breath.) 24 g 3   ammonium lactate  (AMLACTIN DAILY) 12 % lotion Apply 1 Application topically as needed for dry skin. 400 g 0   ammonium lactate  (AMLACTIN) 12 % cream Apply 1 Application topically as needed for dry skin.     apixaban  (ELIQUIS ) 5 MG TABS tablet Take 1 tablet (5 mg total) by mouth 2 (two) times daily. 180 tablet 3    brimonidine (ALPHAGAN) 0.2 % ophthalmic solution 1 drop 2 (two) times daily.     Cholecalciferol  50 MCG (2000 UT) TABS 1 tab by mouth once daily (Patient taking differently: Take 2,000 Units by mouth daily.) 30 tablet 99   clotrimazole -betamethasone  (LOTRISONE ) cream Apply 1 Application topically daily. 30 g 0   diltiazem  (CARDIZEM  CD) 180 MG 24 hr capsule TAKE 1 CAPSULE BY MOUTH IN THE  EVENING 100 capsule 2   dorzolamide (TRUSOPT) 2 % ophthalmic solution 1 drop 2 (two) times daily.     empagliflozin  (JARDIANCE ) 25 MG TABS tablet Take 1 tablet (25 mg total) by mouth daily before breakfast. 90 tablet 3   hydrALAZINE  (APRESOLINE ) 100 MG tablet TAKE 1 TABLET(100 MG) BY MOUTH THREE TIMES DAILY 270 tablet 2   hydrochlorothiazide  (HYDRODIURIL ) 25 MG tablet Take 1 tablet (25 mg total) by mouth daily. 90 tablet 3   isosorbide  dinitrate (ISORDIL ) 20 MG tablet TAKE 1 TABLET(20 MG) BY MOUTH THREE TIMES DAILY 270 tablet 2   ketoconazole  (NIZORAL ) 2 % cream Apply 1 Application topically daily. 30 g 2   ketorolac (ACULAR) 0.5 % ophthalmic solution Per patient for Cataract surgery in November 2024     latanoprost (XALATAN) 0.005 % ophthalmic solution 1 drop at bedtime.     metoprolol  tartrate (LOPRESSOR ) 50 MG tablet Take 1 tablet (50 mg  total) by mouth 2 (two) times daily. 180 tablet 3   nitroGLYCERIN  (NITROSTAT ) 0.4 MG SL tablet Place 1 tablet (0.4 mg total) under the tongue every 5 (five) minutes as needed for chest pain. 90 tablet 3   potassium chloride  (KLOR-CON  10) 10 MEQ tablet Take 1 tablet (10 mEq total) by mouth daily. 90 tablet 3   rosuvastatin  (CRESTOR ) 5 MG tablet TAKE 1 TABLET(5 MG) BY MOUTH DAILY AT BEDTIME 90 tablet 3   No current facility-administered medications on file prior to visit.     ROS: Review of Systems  Constitutional: Negative.  Negative for fever and malaise/fatigue.  Respiratory:  Positive for shortness of breath. Negative for cough and wheezing.        On exertion   Cardiovascular:  Negative for chest pain and leg swelling.     BP 116/69 (BP Location: Left Arm, Patient Position: Sitting, Cuff Size: Large)   Pulse 70   Resp 20   Ht 5' 11 (1.803 m)   Wt 216 lb (98 kg)   SpO2 95% Comment: RA  BMI 30.13 kg/m   Physical Exam Constitutional:      Appearance: Normal appearance.  HENT:     Head: Normocephalic and atraumatic.  Cardiovascular:     Rate and Rhythm: Normal rate and regular rhythm.     Heart sounds: Normal heart sounds, S1 normal and S2 normal.  Pulmonary:     Effort: Pulmonary effort is normal.     Breath sounds: Normal breath sounds.  Skin:    General: Skin is warm and dry.  Neurological:     General: No focal deficit present.     Mental Status: He is alert and oriented to person, place, and time.      Imaging: EXAM: CTA CHEST 05/29/2024 08:33:35 AM   TECHNIQUE: CTA of the chest was performed after the administration of intravenous contrast. Multiplanar reformatted images are provided for review. MIP images are provided for review. Automated exposure control, iterative reconstruction, and/or weight based adjustment of the mA/kV was utilized to reduce the radiation dose to as low as reasonably achievable.   COMPARISON: 05/21/2023 and previous.   CLINICAL HISTORY: Ascending aortic aneurysm surveillance.   FINDINGS:   PULMONARY ARTERIES: Pulmonary arteries are adequately opacified for evaluation. No acute pulmonary embolus. Main pulmonary artery is normal in caliber.   MEDIASTINUM: Mild left atrial dilatation. Coarse calcifications on aortic valve leaflets. Scattered 3-vessel coronary calcifications. 4.4 cm ascending thoracic aortic aneurysm, stable by my measurement since prior study. Moderate calcified plaque through the aortic arch and descending thoracic segment without dissection or aneurysm. Classic 3-vessel brachiocephalic arterial origin anatomy without proximal stenosis. 2.3 cm well marginated soft  tissue nodular mass in the anterior mediastinum, minimally increased since 11/02/2022. 2 cm right Retrocrural nodule stable since prior exam.   LYMPH NODES: No mediastinal, hilar or axillary lymphadenopathy.   LUNGS AND PLEURA: Minimal linear scarring or subsegmental atelectasis posteriorly in the lung bases. No focal consolidation or pulmonary edema. No evidence of pleural effusion or pneumothorax.   UPPER ABDOMEN: Multiple cortical lesions in kidneys, some of which can be characterized as simple cysts, largest 5.3 cm 7 HU left upper pole. 1.9 cm probable cyst in the anterior right hepatic lobe as before.   SOFT TISSUES AND BONES: Mild spondylotic changes in the lower thoracic spine. Right shoulder DJD. No acute soft tissue abnormality.   IMPRESSION: 1. Stable 4.4 cm ascending thoracic aortic aneurysm. According to the ACR 2013 and SVS 2018 guidelines,  recommendation is yearly surveillance imaging. 2. Moderate calcified  atherosclerosis of coronary arteries and thoracic aorta. 3. Unchanged appearance of anterior mediastinal soft tissue lesion, as well as the right retrocrural lesion at the diaphragm. As previously noted, this could represent separately low-grade thymic tumor as well as benign enlargement of the lymph system/cisterna chyli, or alternatively (though less likely given the relative stability) lymphoproliferative disorder.   Electronically signed by: Katheleen Faes MD 05/29/2024 09:32 AM EDT RP Workstation: [id number]       A/P: Aneurysm of ascending aorta without rupture -4.4 cm ascending thoracic aortic aneurysm on CTA of chest. Echocardiogram in 2023 showed tricuspid aortic valve. We discussed the natural history and and risk factors for growth of ascending aortic aneurysms. Discussed recommendations to minimize the risk of further expansion or dissection including careful blood pressure control, avoidance of contact sports and heavy lifting, attention to lipid  management.  We covered the importance of continued smoking cessation.  The patient does not yet meet surgical criteria of >5.5cm. The patient is aware of signs and symptoms of aortic dissection and when to present to the emergency department   -Follow up in one year with CTA of chest    Risk Modification:  Statin:  rosuvastatin   Smoking cessation instruction/counseling given:  commended patient for quitting and reviewed strategies for preventing relapses  Patient was counseled on importance of Blood Pressure Control  They are instructed to contact their Primary Care Physician if they start to have blood pressure readings over 130s/90s. Do not ever stop blood pressure medications on your own, unless instructed by healthcare professional.  Please avoid use of Fluoroquinolones as this can potentially increase your risk of Aortic Rupture and/or Dissection  Patient educated on signs and symptoms of Aortic Dissection, handout also provided in AVS  Manuelita CHRISTELLA Rough, PA-C 06/12/24

## 2024-06-26 ENCOUNTER — Other Ambulatory Visit: Payer: Self-pay | Admitting: Cardiology

## 2024-06-26 DIAGNOSIS — I4819 Other persistent atrial fibrillation: Secondary | ICD-10-CM

## 2024-06-26 NOTE — Telephone Encounter (Signed)
 Message sent to schedulers

## 2024-06-26 NOTE — Telephone Encounter (Signed)
 Prescription refill request for Eliquis  received. Indication: afib  Last office visit:05/30/2023, Patwardhan Scr: 1.79, 04/30/2024 Age: 73 yo  Weight: 98 kg     Pt is overdue for a Dr visit

## 2024-06-26 NOTE — Telephone Encounter (Signed)
 Will send in refill for a 1 month supply so that pt does not run out.

## 2024-07-24 ENCOUNTER — Telehealth: Payer: Self-pay | Admitting: Cardiology

## 2024-07-24 ENCOUNTER — Other Ambulatory Visit: Payer: Self-pay | Admitting: Cardiology

## 2024-07-24 DIAGNOSIS — I10 Essential (primary) hypertension: Secondary | ICD-10-CM

## 2024-07-24 DIAGNOSIS — I4819 Other persistent atrial fibrillation: Secondary | ICD-10-CM

## 2024-07-24 MED ORDER — ISOSORBIDE DINITRATE 20 MG PO TABS: 20.0000 mg | ORAL_TABLET | Freq: Three times a day (TID) | ORAL | 0 refills | Status: AC

## 2024-07-24 MED ORDER — METOPROLOL TARTRATE 50 MG PO TABS: 50.0000 mg | ORAL_TABLET | Freq: Two times a day (BID) | ORAL | 0 refills | Status: DC

## 2024-07-24 NOTE — Telephone Encounter (Signed)
*  STAT* If patient is at the pharmacy, call can be transferred to refill team.   1. Which medications need to be refilled? (please list name of each medication and dose if known) apixaban  (ELIQUIS ) 5 MG TABS tablet  isosorbide  dinitrate (ISORDIL ) 20 MG tablet   metoprolol  tartrate (LOPRESSOR ) 50 MG tablet    2. Would you like to learn more about the convenience, safety, & potential cost savings by using the [facility]? no   3. Are you open to using the [facility] (Type [facility].  ). No    4. Which pharmacy/location (including street and city if local pharmacy) is medication to be sent to? [id number] - [place], [place] - [address] AT [address]     5. Do they need a 30 day or 90 day supply? 90 day   Pt is out of medication

## 2024-07-24 NOTE — Telephone Encounter (Signed)
 Pt's medications were sent to pt's pharmacy as requested. Confirmation received.

## 2024-07-29 ENCOUNTER — Other Ambulatory Visit: Payer: Self-pay | Admitting: Cardiology

## 2024-07-29 DIAGNOSIS — I10 Essential (primary) hypertension: Secondary | ICD-10-CM

## 2024-07-31 ENCOUNTER — Other Ambulatory Visit: Payer: Self-pay | Admitting: Cardiology

## 2024-07-31 DIAGNOSIS — I4819 Other persistent atrial fibrillation: Secondary | ICD-10-CM

## 2024-07-31 NOTE — Telephone Encounter (Signed)
 Prescription refill request for Eliquis  received. Indication:afib Last office visit:upcoming Scr: 1.79  9/25 Age:73 Weight:98  kg  Prescription refilled

## 2024-08-28 ENCOUNTER — Ambulatory Visit: Attending: Cardiology | Admitting: Cardiology

## 2024-08-28 ENCOUNTER — Encounter: Payer: Self-pay | Admitting: Cardiology

## 2024-08-28 VITALS — BP 112/70 | HR 74 | Ht 71.0 in | Wt 220.0 lb

## 2024-08-28 DIAGNOSIS — I4811 Longstanding persistent atrial fibrillation: Secondary | ICD-10-CM | POA: Diagnosis not present

## 2024-08-28 DIAGNOSIS — I10 Essential (primary) hypertension: Secondary | ICD-10-CM | POA: Diagnosis not present

## 2024-08-28 DIAGNOSIS — I712 Thoracic aortic aneurysm, without rupture, unspecified: Secondary | ICD-10-CM | POA: Diagnosis not present

## 2024-08-28 DIAGNOSIS — I471 Supraventricular tachycardia, unspecified: Secondary | ICD-10-CM | POA: Diagnosis not present

## 2024-08-28 DIAGNOSIS — M79671 Pain in right foot: Secondary | ICD-10-CM | POA: Insufficient documentation

## 2024-08-28 DIAGNOSIS — I4819 Other persistent atrial fibrillation: Secondary | ICD-10-CM | POA: Insufficient documentation

## 2024-08-28 MED ORDER — METOPROLOL TARTRATE 50 MG PO TABS: 50.0000 mg | ORAL_TABLET | Freq: Two times a day (BID) | ORAL | 3 refills | Status: AC

## 2024-08-28 MED ORDER — DILTIAZEM HCL ER COATED BEADS 180 MG PO CP24: 180.0000 mg | ORAL_CAPSULE | Freq: Every evening | ORAL | 3 refills | Status: AC

## 2024-08-28 MED ORDER — APIXABAN 5 MG PO TABS: 5.0000 mg | ORAL_TABLET | Freq: Two times a day (BID) | ORAL | 5 refills | Status: AC

## 2024-08-28 NOTE — Progress Notes (Signed)
 " Cardiology Office Note:  .   Date:  08/28/2024  ID:  Roberto Rivera, DOB [birth year], MRN [id number] PCP: Norleen Lynwood ORN, MD  [facility] Providers Cardiologist:  Newman Lawrence, MD PCP: Norleen Lynwood ORN, MD  Chief Complaint  Patient presents with   Atrial Fibrillation     Roberto Rivera is a 74 y.o. male with hypertension, hyperlipidemia, CAD, PAF, PSVT, h/o stroke, asymptomatic fusiform ascending aneurysm measuring at 4.0-4.5 cm, Rt foot pain  Discussed the use of AI scribe software for clinical note transcription with the patient, who gave verbal consent to proceed.  History of Present Illness Roberto Rivera is a 74 year old male with coronary artery disease and atrial fibrillation who presents with left foot pain.  He has had focal tenderness at the left lateral malleolus since yesterday morning, with pain on weight bearing and associated limp. He is unsure of the cause but suspects pressure from his other foot. He has no prior gout and no recent trauma.  He has coronary artery disease, atrial fibrillation, and a dilated aorta. CT in October showed stable aortic diameter of 4.4 cm. He has no chest pain, dyspnea, or palpitations.  He takes Eliquis  5 mg twice daily, diltiazem  180 mg daily, Jardiance  25 mg daily, hydralazine  100 mg three times daily, hydrochlorothiazide  25 mg daily, isosorbide  20 mg three times daily, metoprolol  50 mg twice daily, and rosuvastatin  5 mg daily. He missed one dose of isosorbide  last night but otherwise takes medications as prescribed.      Vitals:   08/28/24 0833  Pulse: 74  SpO2: 97%      Review of Systems  Cardiovascular:  Negative for chest pain, dyspnea on exertion, leg swelling, palpitations and syncope.  Musculoskeletal:        Right foot pain         Studies Reviewed: SABRA    EKG 08/28/2024: Atrial fibrillation 74 bpm Nonspecific T wave abnormality When compared with ECG of 16-May-2022 04:27,  Afib has replaced sinus rhythm     CTA  aorta 05/2024: 1. Stable 4.4 cm ascending thoracic aortic aneurysm. According to the ACR 2013 and SVS 2018 guidelines, recommendation is yearly surveillance imaging. 2. Moderate calcified  atherosclerosis of coronary arteries and thoracic aorta. 3. Unchanged appearance of anterior mediastinal soft tissue lesion, as well as the right retrocrural lesion at the diaphragm. As previously noted, this could represent separately low-grade thymic tumor as well as benign enlargement of the lymph system/cisterna chyli, or alternatively (though less likely given the relative stability) lymphoproliferative disorder.   Echocardiogram 2023:  1. Left ventricular ejection fraction, by estimation, is 55 to 60%. The  left ventricle has normal function. The left ventricle has no regional  wall motion abnormalities. There is moderate left ventricular hypertrophy.  Left ventricular diastolic parameters are consistent with Grade II diastolic dysfunction  (pseudonormalization). Elevated left atrial pressure.   2. Right ventricular systolic function is normal. The right ventricular  size is normal. There is normal pulmonary artery systolic pressure.   3. Left atrial size was severely dilated.   4. The mitral valve is normal in structure. No evidence of mitral valve  regurgitation. Mild mitral stenosis. The mean mitral valve gradient is 4.0  mmHg.   5. The aortic valve is tricuspid. Aortic valve regurgitation is mild to  moderate. Mild aortic valve stenosis. Aortic valve area, by VTI measures  2.33 cm. Aortic valve mean gradient measures 12.0 mmHg. Aortic valve Vmax  measures 2.37 m/s.  6. Aortic dilatation noted. Aneurysm of the ascending aorta, measuring 45  mm.   Comparison(s): A prior study was performed on 07/01/2020. Changes from  prior study are noted. Mild AS, mild MS new. LVH increased from mild to  moderate. Diastolic dysfunction increased from I to II.   Coronary angiography 2023: LM: Large,  normal LAD: Large, minimal luminal irregularities Ramus: Large, normal LCx: Large, normal RCA:  Prox calcific 50% stenosis, distal RCA subtotal occlusion with TIMI 1 flo  Labs 04/2024: Chol 100, TG 64, HDL 46, LDL 41 HbA1C 6.4% Cr 1.79, K 3.1, eGFR 37  10/2023: Hb 13.4  Risk Assessment/Calculations:    CHA2DS2-VASc Score = 5  This indicates a 7.2% annual risk of stroke. The patient's score is based upon: CHF History: 0 HTN History: 1 Diabetes History: 0 Stroke History: 2 Vascular Disease History: 1 Age Score: 1 Gender Score: 0      Physical Exam Vitals and nursing note reviewed.  Constitutional:      General: He is not in acute distress. Neck:     Vascular: No JVD.  Cardiovascular:     Rate and Rhythm: Normal rate. Rhythm irregular.     Pulses:          Dorsalis pedis pulses are 1+ on the right side and 1+ on the left side.       Posterior tibial pulses are 0 on the right side and 0 on the left side.     Heart sounds: Normal heart sounds. No murmur heard.    Comments: No focal swelling/erythema Pulmonary:     Effort: Pulmonary effort is normal.     Breath sounds: Normal breath sounds. No wheezing or rales.  Musculoskeletal:     Right lower leg: Edema (Trace) present.     Left lower leg: Edema (Trace) present.      VISIT DIAGNOSES:   ICD-10-CM   1. Longstanding persistent atrial fibrillation (HCC)  I48.11 EKG 12-Lead    apixaban  (ELIQUIS ) 5 MG TABS tablet    metoprolol  tartrate (LOPRESSOR ) 50 MG tablet    ECHOCARDIOGRAM COMPLETE    2. Essential hypertension  I10 EKG 12-Lead    3. PSVT (paroxysmal supraventricular tachycardia)  I47.10 EKG 12-Lead    4. Right foot pain  M79.671 DG Foot Complete Right    VAS US  ABI WITH/WO TBI    5. Thoracic aortic aneurysm without rupture, unspecified part  I71.20 ECHOCARDIOGRAM COMPLETE       Roberto Rivera is a 74 y.o. male with hypertension, hyperlipidemia, CAD, PAF, PSVT, h/o stroke, asymptomatic fusiform ascending  aneurysm measuring at 4.0-4.5 cm, Rt foot pain Assessment & Plan   Right foot pain: Acute pain at lateral malleolus, no swelling or tenderness, reduced pulses, fracture or vascular issue possible. - Ordered right foot x-ray to rule out fracture. - Ordered ultrasound of leg arteries to assess circulation. - Advised to keep appointment with podiatrist for further evaluation.  Persistent atrial fibrillation: Heart rate well-controlled on current medication, no recent palpitations or skipped beats.  Continue rate control approach. - Continue Eliquis  5 mg twice daily. - Continue metoprolol  50 mg twice daily. - Continue diltiazem  180 mg daily. - Sent refills for Eliquis , metoprolol , and diltiazem  to pharmacy.  Coronary artery disease: Recent CT shows plaque buildup, no symptoms of chest pain or shortness of breath. - Continue current management and monitoring.  Aortic root dilation: Stable at 4.4 cm, no significant increase on recent CT. - Continue monitoring with regular imaging. - Will obtain  echocardiogram, historically echocardiogram and CTA have correlated well.  Essential hypertension: Blood pressure well-controlled with current regimen. - Continue current antihypertensive medications.  Mixed hyperlipidemia: Cholesterol levels stable on rosuvastatin  5 mg daily. - Continue rosuvastatin  5 mg daily.  Mitral valve stenosis with calcification: Mild stenosis with calcification, no significant symptoms. - Ordered echocardiogram to assess current status of mitral valve.  Tricuspid valve regurgitation: Mild to moderate regurgitation, no significant symptoms. - Ordered echocardiogram to assess current status of tricuspid valve.       Meds ordered this encounter  Medications   apixaban  (ELIQUIS ) 5 MG TABS tablet    Sig: Take 1 tablet (5 mg total) by mouth 2 (two) times daily.    Dispense:  60 tablet    Refill:  5   diltiazem  (CARDIZEM  CD) 180 MG 24 hr capsule    Sig: Take 1  capsule (180 mg total) by mouth every evening.    Dispense:  90 capsule    Refill:  3   metoprolol  tartrate (LOPRESSOR ) 50 MG tablet    Sig: Take 1 tablet (50 mg total) by mouth 2 (two) times daily.    Dispense:  180 tablet    Refill:  3     F/u in 1 year  Signed, Newman JINNY Lawrence, MD  "

## 2024-08-28 NOTE — Patient Instructions (Addendum)
 Medication Instructions:  Refills sent in   *If you need a refill on your cardiac medications before your next appointment, please call your pharmacy*   Testing/Procedures: Right foot xray  ABI  Your physician has requested that you have a lower or upper extremity arterial duplex. This test is an ultrasound of the arteries in the legs or arms. It looks at arterial blood flow in the legs and arms. Allow one hour for Lower and Upper Arterial scans. There are no restrictions or special instructions.  Please note: We ask at that you not bring children with you during ultrasound (echo/ vascular) testing. Due to room size and safety concerns, children are not allowed in the ultrasound rooms during exams. Our front office staff cannot provide observation of children in our lobby area while testing is being conducted. An adult accompanying a patient to their appointment will only be allowed in the ultrasound room at the discretion of the ultrasound technician under special circumstances. We apologize for any inconvenience.  ECHOCARDIOGRAM  Your physician has requested that you have an echocardiogram. Echocardiography is a painless test that uses sound waves to create images of your heart. It provides your doctor with information about the size and shape of your heart and how well your hearts chambers and valves are working. This procedure takes approximately one hour. There are no restrictions for this procedure. Please do NOT wear cologne, perfume, aftershave, or lotions (deodorant is allowed). Please arrive 15 minutes prior to your appointment time.  Please note: We ask at that you not bring children with you during ultrasound (echo/ vascular) testing. Due to room size and safety concerns, children are not allowed in the ultrasound rooms during exams. Our front office staff cannot provide observation of children in our lobby area while testing is being conducted. An adult accompanying a patient to  their appointment will only be allowed in the ultrasound room at the discretion of the ultrasound technician under special circumstances. We apologize for any inconvenience.   Follow-Up: At [facility], you and your health needs are our priority.  As part of our continuing mission to provide you with exceptional heart care, our providers are all part of one team.  This team includes your primary Cardiologist (physician) and Advanced Practice Providers or APPs (Physician Assistants and Nurse Practitioners) who all work together to provide you with the care you need, when you need it.  Your next appointment:   1 year   Provider:   Newman JINNY Lawrence, MD

## 2024-09-03 ENCOUNTER — Ambulatory Visit: Admitting: Podiatry

## 2024-09-03 DIAGNOSIS — M7661 Achilles tendinitis, right leg: Secondary | ICD-10-CM

## 2024-09-03 DIAGNOSIS — B351 Tinea unguium: Secondary | ICD-10-CM

## 2024-09-03 NOTE — Progress Notes (Signed)
"  °  Subjective:  Patient ID: Roberto Rivera, male    DOB: 03/22/1951,  MRN: [id number]  Chief Complaint  Patient presents with   Nail Problem    Nail trim Some discomfort with his right foot    74 y.o. male returns for the above complaint.  Patient presents with thickened elongated dystrophic toenails x10.  Patient is on blood thinner.  He would like to have the nails debrided down as he is not able to do it himself.  He states is mildly painful to touch.  He also has secondary complaint of right Achilles pain.  He states it just came out of nowhere and is causing him a lot of discomfort would like to discuss treatment options for it  Objective:  There were no vitals filed for this visit. Podiatric Exam: Vascular: dorsalis pedis and posterior tibial pulses are palpable bilateral. Capillary return is immediate. Temperature gradient is WNL. Skin turgor WNL  Sensorium: Normal Semmes Weinstein monofilament test. Normal tactile sensation bilaterally. Nail Exam: Pt has thick disfigured discolored nails with subungual debris noted bilateral entire nail hallux through fifth toenails.  Pain on palpation to the nails. Ulcer Exam: There is no evidence of ulcer or pre-ulcerative changes or infection. Orthopedic Exam: Pain on palpation left right Achilles tendinitis pain with dorsiflexion of the ankle joint no pain with plantarflexion of the ankle joint.  Haglund deformity noted positive Silfverskiold test noted gastrocnemius equinus Skin: No Porokeratosis. No infection or ulcers.     Assessment & Plan:   1. Pain due to onychomycosis of toenails of both feet   2. Achilles tendinitis, right leg           Patient was evaluated and treated and all questions answered.  Right Achilles tendinitis - All questions and concerns were discussed with the patient in extensive detail given the amount of pain that is present patient would benefit from cam boot immobilization to allow the soft tissue structure to  heal appropriately.  Patient agrees with plan like to proceed with cam boot immobilization - Cam boot was dispensed if there is no improvement we will discuss steroid injection  Onychomycosis with pain  -Nails palliatively debrided as below. -Educated on self-care  Procedure: Nail Debridement Rationale: pain  Type of Debridement: manual, sharp debridement. Instrumentation: Nail nipper, rotary burr. Number of Nails: 10  Procedures and Treatment: Consent by patient was obtained for treatment procedures. The patient understood the discussion of treatment and procedures well. All questions were answered thoroughly reviewed. Debridement of mycotic and hypertrophic toenails, 1 through 5 bilateral and clearing of subungual debris. No ulceration, no infection noted.  Return Visit-Office Procedure: Patient instructed to return to the office for a follow up visit 3 months for continued evaluation and treatment.  [name], DPM    No follow-ups on file. "

## 2024-09-05 ENCOUNTER — Ambulatory Visit: Payer: Self-pay | Admitting: Cardiology

## 2024-09-05 ENCOUNTER — Ambulatory Visit (HOSPITAL_COMMUNITY)
Admission: RE | Admit: 2024-09-05 | Discharge: 2024-09-05 | Disposition: A | Discharge status: Home / Self Care | Source: Ambulatory Visit | Attending: Cardiology | Admitting: Cardiology

## 2024-09-05 DIAGNOSIS — M79671 Pain in right foot: Secondary | ICD-10-CM | POA: Insufficient documentation

## 2024-09-05 LAB — VAS US ABI WITH/WO TBI
Left ABI: 1.22
Right ABI: 1.25

## 2024-09-24 ENCOUNTER — Encounter (INDEPENDENT_AMBULATORY_CARE_PROVIDER_SITE_OTHER): Admitting: Ophthalmology

## 2024-09-24 DIAGNOSIS — H353132 Nonexudative age-related macular degeneration, bilateral, intermediate dry stage: Secondary | ICD-10-CM

## 2024-09-24 DIAGNOSIS — H2513 Age-related nuclear cataract, bilateral: Secondary | ICD-10-CM | POA: Diagnosis not present

## 2024-09-24 DIAGNOSIS — H35033 Hypertensive retinopathy, bilateral: Secondary | ICD-10-CM | POA: Diagnosis not present

## 2024-09-24 DIAGNOSIS — D3132 Benign neoplasm of left choroid: Secondary | ICD-10-CM

## 2024-09-24 DIAGNOSIS — H43813 Vitreous degeneration, bilateral: Secondary | ICD-10-CM | POA: Diagnosis not present

## 2024-09-24 DIAGNOSIS — I10 Essential (primary) hypertension: Secondary | ICD-10-CM

## 2024-09-26 ENCOUNTER — Ambulatory Visit (HOSPITAL_COMMUNITY): Admission: RE | Admit: 2024-09-26

## 2024-10-01 ENCOUNTER — Ambulatory Visit: Admitting: Podiatry

## 2024-10-30 ENCOUNTER — Ambulatory Visit: Admitting: Internal Medicine

## 2024-11-11 ENCOUNTER — Ambulatory Visit (HOSPITAL_COMMUNITY)

## 2025-02-18 ENCOUNTER — Ambulatory Visit

## 2025-09-24 ENCOUNTER — Encounter (INDEPENDENT_AMBULATORY_CARE_PROVIDER_SITE_OTHER): Admitting: Ophthalmology
